# Patient Record
Sex: Female | Born: 1940 | Race: White | Hispanic: No | Marital: Married | Smoking: Never smoker
Health system: Southern US, Community
[De-identification: ages and names within clinical notes are randomized; demographics above are authoritative.]

## PROBLEM LIST (undated history)

## (undated) DIAGNOSIS — M159 Polyosteoarthritis, unspecified: Secondary | ICD-10-CM

## (undated) DIAGNOSIS — E039 Hypothyroidism, unspecified: Secondary | ICD-10-CM

## (undated) DIAGNOSIS — E669 Obesity, unspecified: Secondary | ICD-10-CM

## (undated) DIAGNOSIS — L299 Pruritus, unspecified: Secondary | ICD-10-CM

## (undated) DIAGNOSIS — B019 Varicella without complication: Secondary | ICD-10-CM

## (undated) DIAGNOSIS — E559 Vitamin D deficiency, unspecified: Secondary | ICD-10-CM

## (undated) DIAGNOSIS — I10 Essential (primary) hypertension: Secondary | ICD-10-CM

## (undated) DIAGNOSIS — L989 Disorder of the skin and subcutaneous tissue, unspecified: Secondary | ICD-10-CM

## (undated) DIAGNOSIS — R2681 Unsteadiness on feet: Secondary | ICD-10-CM

## (undated) DIAGNOSIS — B86 Scabies: Secondary | ICD-10-CM

## (undated) DIAGNOSIS — Z8719 Personal history of other diseases of the digestive system: Secondary | ICD-10-CM

## (undated) DIAGNOSIS — N200 Calculus of kidney: Secondary | ICD-10-CM

## (undated) DIAGNOSIS — T8859XA Other complications of anesthesia, initial encounter: Secondary | ICD-10-CM

## (undated) DIAGNOSIS — Z87442 Personal history of urinary calculi: Secondary | ICD-10-CM

## (undated) DIAGNOSIS — T7840XA Allergy, unspecified, initial encounter: Secondary | ICD-10-CM

## (undated) DIAGNOSIS — Z9889 Other specified postprocedural states: Secondary | ICD-10-CM

## (undated) DIAGNOSIS — N390 Urinary tract infection, site not specified: Secondary | ICD-10-CM

## (undated) DIAGNOSIS — K219 Gastro-esophageal reflux disease without esophagitis: Secondary | ICD-10-CM

## (undated) DIAGNOSIS — E079 Disorder of thyroid, unspecified: Secondary | ICD-10-CM

## (undated) DIAGNOSIS — J988 Other specified respiratory disorders: Secondary | ICD-10-CM

## (undated) DIAGNOSIS — B269 Mumps without complication: Secondary | ICD-10-CM

## (undated) DIAGNOSIS — B059 Measles without complication: Secondary | ICD-10-CM

## (undated) DIAGNOSIS — H8110 Benign paroxysmal vertigo, unspecified ear: Secondary | ICD-10-CM

## (undated) DIAGNOSIS — B9789 Other viral agents as the cause of diseases classified elsewhere: Secondary | ICD-10-CM

## (undated) DIAGNOSIS — M1990 Unspecified osteoarthritis, unspecified site: Secondary | ICD-10-CM

## (undated) DIAGNOSIS — Z0000 Encounter for general adult medical examination without abnormal findings: Secondary | ICD-10-CM

## (undated) DIAGNOSIS — M810 Age-related osteoporosis without current pathological fracture: Secondary | ICD-10-CM

## (undated) HISTORY — DX: Varicella without complication: B01.9

## (undated) HISTORY — DX: Unsteadiness on feet: R26.81

## (undated) HISTORY — DX: Other specified respiratory disorders: J98.8

## (undated) HISTORY — DX: Measles without complication: B05.9

## (undated) HISTORY — DX: Obesity, unspecified: E66.9

## (undated) HISTORY — DX: Personal history of other diseases of the digestive system: Z87.19

## (undated) HISTORY — DX: Allergy, unspecified, initial encounter: T78.40XA

## (undated) HISTORY — DX: Pruritus, unspecified: L29.9

## (undated) HISTORY — DX: Urinary tract infection, site not specified: N39.0

## (undated) HISTORY — DX: Age-related osteoporosis without current pathological fracture: M81.0

## (undated) HISTORY — PX: HIP SURGERY: SHX245

## (undated) HISTORY — DX: Mumps without complication: B26.9

## (undated) HISTORY — PX: LITHOTRIPSY: SUR834

## (undated) HISTORY — DX: Essential (primary) hypertension: I10

## (undated) HISTORY — PX: ABDOMINAL HYSTERECTOMY: SHX81

## (undated) HISTORY — PX: JOINT REPLACEMENT: SHX530

## (undated) HISTORY — PX: KNEE SURGERY: SHX244

## (undated) HISTORY — DX: Calculus of kidney: N20.0

## (undated) HISTORY — DX: Scabies: B86

## (undated) HISTORY — DX: Polyosteoarthritis, unspecified: M15.9

## (undated) HISTORY — DX: Benign paroxysmal vertigo, unspecified ear: H81.10

## (undated) HISTORY — DX: Disorder of the skin and subcutaneous tissue, unspecified: L98.9

## (undated) HISTORY — DX: Unspecified osteoarthritis, unspecified site: M19.90

## (undated) HISTORY — DX: Other viral agents as the cause of diseases classified elsewhere: B97.89

## (undated) HISTORY — DX: Gastro-esophageal reflux disease without esophagitis: K21.9

## (undated) HISTORY — DX: Vitamin D deficiency, unspecified: E55.9

## (undated) HISTORY — DX: Other specified postprocedural states: Z98.890

## (undated) HISTORY — DX: Disorder of thyroid, unspecified: E07.9

## (undated) HISTORY — DX: Encounter for general adult medical examination without abnormal findings: Z00.00

---

## 1998-07-06 DIAGNOSIS — N200 Calculus of kidney: Secondary | ICD-10-CM

## 1998-07-06 HISTORY — DX: Calculus of kidney: N20.0

## 2004-07-06 LAB — HM COLONOSCOPY

## 2007-07-07 LAB — HM MAMMOGRAPHY

## 2012-02-08 ENCOUNTER — Ambulatory Visit (INDEPENDENT_AMBULATORY_CARE_PROVIDER_SITE_OTHER): Payer: Medicare HMO | Admitting: Family Medicine

## 2012-02-08 ENCOUNTER — Encounter: Payer: Self-pay | Admitting: Family Medicine

## 2012-02-08 VITALS — BP 125/81 | HR 92 | Temp 97.6°F | Ht 61.25 in | Wt 212.8 lb

## 2012-02-08 DIAGNOSIS — E669 Obesity, unspecified: Secondary | ICD-10-CM

## 2012-02-08 DIAGNOSIS — E039 Hypothyroidism, unspecified: Secondary | ICD-10-CM

## 2012-02-08 DIAGNOSIS — B269 Mumps without complication: Secondary | ICD-10-CM | POA: Insufficient documentation

## 2012-02-08 DIAGNOSIS — E559 Vitamin D deficiency, unspecified: Secondary | ICD-10-CM

## 2012-02-08 DIAGNOSIS — N200 Calculus of kidney: Secondary | ICD-10-CM | POA: Insufficient documentation

## 2012-02-08 DIAGNOSIS — E079 Disorder of thyroid, unspecified: Secondary | ICD-10-CM

## 2012-02-08 DIAGNOSIS — Z0000 Encounter for general adult medical examination without abnormal findings: Secondary | ICD-10-CM

## 2012-02-08 DIAGNOSIS — M1990 Unspecified osteoarthritis, unspecified site: Secondary | ICD-10-CM | POA: Insufficient documentation

## 2012-02-08 DIAGNOSIS — T7840XA Allergy, unspecified, initial encounter: Secondary | ICD-10-CM

## 2012-02-08 DIAGNOSIS — I10 Essential (primary) hypertension: Secondary | ICD-10-CM

## 2012-02-08 DIAGNOSIS — L989 Disorder of the skin and subcutaneous tissue, unspecified: Secondary | ICD-10-CM

## 2012-02-08 DIAGNOSIS — Z9889 Other specified postprocedural states: Secondary | ICD-10-CM | POA: Insufficient documentation

## 2012-02-08 DIAGNOSIS — M129 Arthropathy, unspecified: Secondary | ICD-10-CM

## 2012-02-08 LAB — HEPATIC FUNCTION PANEL
Albumin: 3.7 g/dL (ref 3.5–5.2)
Alkaline Phosphatase: 58 U/L (ref 39–117)
Total Bilirubin: 0.4 mg/dL (ref 0.3–1.2)

## 2012-02-08 LAB — RENAL FUNCTION PANEL
BUN: 14 mg/dL (ref 6–23)
Chloride: 107 mEq/L (ref 96–112)
GFR: 97.23 mL/min (ref >60.00)
Phosphorus: 3.4 mg/dL (ref 2.3–4.6)
Potassium: 3.7 mEq/L (ref 3.5–5.1)
Sodium: 141 mEq/L (ref 135–145)

## 2012-02-08 LAB — CBC
MCHC: 33.4 g/dL (ref 30.0–36.0)
MCV: 90.3 fl (ref 78.0–100.0)
RDW: 14.1 % (ref 11.5–14.6)

## 2012-02-08 NOTE — Assessment & Plan Note (Signed)
Well controlled on current dose of medication, no changes.

## 2012-02-08 NOTE — Assessment & Plan Note (Signed)
No recent flares. Maintain adequate hydration 

## 2012-02-08 NOTE — Assessment & Plan Note (Signed)
Patient reports Synthroid dose has been stable recently. Recheck TSH and request old records. Once we know her dose is correct she will let us know where she would like her 3 month supply sent

## 2012-02-08 NOTE — Patient Instructions (Addendum)
Preventive Care for Adults, Female A healthy lifestyle and preventive care can promote health and wellness. Preventive health guidelines for women include the following key practices.  A routine yearly physical is a good way to check with your caregiver about your health and preventive screening. It is a chance to share any concerns and updates on your health, and to receive a thorough exam.   Visit your dentist for a routine exam and preventive care every 6 months. Brush your teeth twice a day and floss once a day. Good oral hygiene prevents tooth decay and gum disease.   The frequency of eye exams is based on your age, health, family medical history, use of contact lenses, and other factors. Follow your caregiver's recommendations for frequency of eye exams.   Eat a healthy diet. Foods like vegetables, fruits, whole grains, low-fat dairy products, and lean protein foods contain the nutrients you need without too many calories. Decrease your intake of foods high in solid fats, added sugars, and salt. Eat the right amount of calories for you.Get information about a proper diet from your caregiver, if necessary.   Regular physical exercise is one of the most important things you can do for your health. Most adults should get at least 150 minutes of moderate-intensity exercise (any activity that increases your heart rate and causes you to sweat) each week. In addition, most adults need muscle-strengthening exercises on 2 or more days a week.   Maintain a healthy weight. The body mass index (BMI) is a screening tool to identify possible weight problems. It provides an estimate of body fat based on height and weight. Your caregiver can help determine your BMI, and can help you achieve or maintain a healthy weight.For adults 20 years and older:   A BMI below 18.5 is considered underweight.   A BMI of 18.5 to 24.9 is normal.   A BMI of 25 to 29.9 is considered overweight.   A BMI of 30 and above is  considered obese.   Maintain normal blood lipids and cholesterol levels by exercising and minimizing your intake of saturated fat. Eat a balanced diet with plenty of fruit and vegetables. Blood tests for lipids and cholesterol should begin at age 20 and be repeated every 5 years. If your lipid or cholesterol levels are high, you are over 50, or you are at high risk for heart disease, you may need your cholesterol levels checked more frequently.Ongoing high lipid and cholesterol levels should be treated with medicines if diet and exercise are not effective.   If you smoke, find out from your caregiver how to quit. If you do not use tobacco, do not start.   If you are pregnant, do not drink alcohol. If you are breastfeeding, be very cautious about drinking alcohol. If you are not pregnant and choose to drink alcohol, do not exceed 1 drink per day. One drink is considered to be 12 ounces (355 mL) of beer, 5 ounces (148 mL) of wine, or 1.5 ounces (44 mL) of liquor.   Avoid use of street drugs. Do not share needles with anyone. Ask for help if you need support or instructions about stopping the use of drugs.   High blood pressure causes heart disease and increases the risk of stroke. Your blood pressure should be checked at least every 1 to 2 years. Ongoing high blood pressure should be treated with medicines if weight loss and exercise are not effective.   If you are 55 to 71   years old, ask your caregiver if you should take aspirin to prevent strokes.   Diabetes screening involves taking a blood sample to check your fasting blood sugar level. This should be done once every 3 years, after age 45, if you are within normal weight and without risk factors for diabetes. Testing should be considered at a younger age or be carried out more frequently if you are overweight and have at least 1 risk factor for diabetes.   Breast cancer screening is essential preventive care for women. You should practice "breast  self-awareness." This means understanding the normal appearance and feel of your breasts and may include breast self-examination. Any changes detected, no matter how small, should be reported to a caregiver. Women in their 20s and 30s should have a clinical breast exam (CBE) by a caregiver as part of a regular health exam every 1 to 3 years. After age 40, women should have a CBE every year. Starting at age 40, women should consider having a mammography (breast X-ray test) every year. Women who have a family history of breast cancer should talk to their caregiver about genetic screening. Women at a high risk of breast cancer should talk to their caregivers about having magnetic resonance imaging (MRI) and a mammography every year.   The Pap test is a screening test for cervical cancer. A Pap test can show cell changes on the cervix that might become cervical cancer if left untreated. A Pap test is a procedure in which cells are obtained and examined from the lower end of the uterus (cervix).   Women should have a Pap test starting at age 21.   Between ages 21 and 29, Pap tests should be repeated every 2 years.   Beginning at age 30, you should have a Pap test every 3 years as long as the past 3 Pap tests have been normal.   Some women have medical problems that increase the chance of getting cervical cancer. Talk to your caregiver about these problems. It is especially important to talk to your caregiver if a new problem develops soon after your last Pap test. In these cases, your caregiver may recommend more frequent screening and Pap tests.   The above recommendations are the same for women who have or have not gotten the vaccine for human papillomavirus (HPV).   If you had a hysterectomy for a problem that was not cancer or a condition that could lead to cancer, then you no longer need Pap tests. Even if you no longer need a Pap test, a regular exam is a good idea to make sure no other problems are  starting.   If you are between ages 65 and 70, and you have had normal Pap tests going back 10 years, you no longer need Pap tests. Even if you no longer need a Pap test, a regular exam is a good idea to make sure no other problems are starting.   If you have had past treatment for cervical cancer or a condition that could lead to cancer, you need Pap tests and screening for cancer for at least 20 years after your treatment.   If Pap tests have been discontinued, risk factors (such as a new sexual partner) need to be reassessed to determine if screening should be resumed.   The HPV test is an additional test that may be used for cervical cancer screening. The HPV test looks for the virus that can cause the cell changes on the cervix.   The cells collected during the Pap test can be tested for HPV. The HPV test could be used to screen women aged 30 years and older, and should be used in women of any age who have unclear Pap test results. After the age of 30, women should have HPV testing at the same frequency as a Pap test.   Colorectal cancer can be detected and often prevented. Most routine colorectal cancer screening begins at the age of 50 and continues through age 75. However, your caregiver may recommend screening at an earlier age if you have risk factors for colon cancer. On a yearly basis, your caregiver may provide home test kits to check for hidden blood in the stool. Use of a small camera at the end of a tube, to directly examine the colon (sigmoidoscopy or colonoscopy), can detect the earliest forms of colorectal cancer. Talk to your caregiver about this at age 50, when routine screening begins. Direct examination of the colon should be repeated every 5 to 10 years through age 75, unless early forms of pre-cancerous polyps or small growths are found.   Hepatitis C blood testing is recommended for all people born from 1945 through 1965 and any individual with known risks for hepatitis C.    Practice safe sex. Use condoms and avoid high-risk sexual practices to reduce the spread of sexually transmitted infections (STIs). STIs include gonorrhea, chlamydia, syphilis, trichomonas, herpes, HPV, and human immunodeficiency virus (HIV). Herpes, HIV, and HPV are viral illnesses that have no cure. They can result in disability, cancer, and death. Sexually active women aged 25 and younger should be checked for chlamydia. Older women with new or multiple partners should also be tested for chlamydia. Testing for other STIs is recommended if you are sexually active and at increased risk.   Osteoporosis is a disease in which the bones lose minerals and strength with aging. This can result in serious bone fractures. The risk of osteoporosis can be identified using a bone density scan. Women ages 65 and over and women at risk for fractures or osteoporosis should discuss screening with their caregivers. Ask your caregiver whether you should take a calcium supplement or vitamin D to reduce the rate of osteoporosis.   Menopause can be associated with physical symptoms and risks. Hormone replacement therapy is available to decrease symptoms and risks. You should talk to your caregiver about whether hormone replacement therapy is right for you.   Use sunscreen with sun protection factor (SPF) of 30 or more. Apply sunscreen liberally and repeatedly throughout the day. You should seek shade when your shadow is shorter than you. Protect yourself by wearing long sleeves, pants, a wide-brimmed hat, and sunglasses year round, whenever you are outdoors.   Once a month, do a whole body skin exam, using a mirror to look at the skin on your back. Notify your caregiver of new moles, moles that have irregular borders, moles that are larger than a pencil eraser, or moles that have changed in shape or color.   Stay current with required immunizations.   Influenza. You need a dose every fall (or winter). The composition of  the flu vaccine changes each year, so being vaccinated once is not enough.   Pneumococcal polysaccharide. You need 1 to 2 doses if you smoke cigarettes or if you have certain chronic medical conditions. You need 1 dose at age 65 (or older) if you have never been vaccinated.   Tetanus, diphtheria, pertussis (Tdap, Td). Get 1 dose of   Tdap vaccine if you are younger than age 65, are over 65 and have contact with an infant, are a healthcare worker, are pregnant, or simply want to be protected from whooping cough. After that, you need a Td booster dose every 10 years. Consult your caregiver if you have not had at least 3 tetanus and diphtheria-containing shots sometime in your life or have a deep or dirty wound.   HPV. You need this vaccine if you are a woman age 26 or younger. The vaccine is given in 3 doses over 6 months.   Measles, mumps, rubella (MMR). You need at least 1 dose of MMR if you were born in 1957 or later. You may also need a second dose.   Meningococcal. If you are age 19 to 21 and a first-year college student living in a residence hall, or have one of several medical conditions, you need to get vaccinated against meningococcal disease. You may also need additional booster doses.   Zoster (shingles). If you are age 60 or older, you should get this vaccine.   Varicella (chickenpox). If you have never had chickenpox or you were vaccinated but received only 1 dose, talk to your caregiver to find out if you need this vaccine.   Hepatitis A. You need this vaccine if you have a specific risk factor for hepatitis A virus infection or you simply wish to be protected from this disease. The vaccine is usually given as 2 doses, 6 to 18 months apart.   Hepatitis B. You need this vaccine if you have a specific risk factor for hepatitis B virus infection or you simply wish to be protected from this disease. The vaccine is given in 3 doses, usually over 6 months.  Preventive Services /  Frequency Ages 19 to 39  Blood pressure check.** / Every 1 to 2 years.   Lipid and cholesterol check.** / Every 5 years beginning at age 20.   Clinical breast exam.** / Every 3 years for women in their 20s and 30s.   Pap test.** / Every 2 years from ages 21 through 29. Every 3 years starting at age 30 through age 65 or 70 with a history of 3 consecutive normal Pap tests.   HPV screening.** / Every 3 years from ages 30 through ages 65 to 70 with a history of 3 consecutive normal Pap tests.   Hepatitis C blood test.** / For any individual with known risks for hepatitis C.   Skin self-exam. / Monthly.   Influenza immunization.** / Every year.   Pneumococcal polysaccharide immunization.** / 1 to 2 doses if you smoke cigarettes or if you have certain chronic medical conditions.   Tetanus, diphtheria, pertussis (Tdap, Td) immunization. / A one-time dose of Tdap vaccine. After that, you need a Td booster dose every 10 years.   HPV immunization. / 3 doses over 6 months, if you are 26 and younger.   Measles, mumps, rubella (MMR) immunization. / You need at least 1 dose of MMR if you were born in 1957 or later. You may also need a second dose.   Meningococcal immunization. / 1 dose if you are age 19 to 21 and a first-year college student living in a residence hall, or have one of several medical conditions, you need to get vaccinated against meningococcal disease. You may also need additional booster doses.   Varicella immunization.** / Consult your caregiver.   Hepatitis A immunization.** / Consult your caregiver. 2 doses, 6 to 18 months   apart.   Hepatitis B immunization.** / Consult your caregiver. 3 doses usually over 6 months.  Ages 40 to 64  Blood pressure check.** / Every 1 to 2 years.   Lipid and cholesterol check.** / Every 5 years beginning at age 20.   Clinical breast exam.** / Every year after age 40.   Mammogram.** / Every year beginning at age 40 and continuing for as  long as you are in good health. Consult with your caregiver.   Pap test.** / Every 3 years starting at age 30 through age 65 or 70 with a history of 3 consecutive normal Pap tests.   HPV screening.** / Every 3 years from ages 30 through ages 65 to 70 with a history of 3 consecutive normal Pap tests.   Fecal occult blood test (FOBT) of stool. / Every year beginning at age 50 and continuing until age 75. You may not need to do this test if you get a colonoscopy every 10 years.   Flexible sigmoidoscopy or colonoscopy.** / Every 5 years for a flexible sigmoidoscopy or every 10 years for a colonoscopy beginning at age 50 and continuing until age 75.   Hepatitis C blood test.** / For all people born from 1945 through 1965 and any individual with known risks for hepatitis C.   Skin self-exam. / Monthly.   Influenza immunization.** / Every year.   Pneumococcal polysaccharide immunization.** / 1 to 2 doses if you smoke cigarettes or if you have certain chronic medical conditions.   Tetanus, diphtheria, pertussis (Tdap, Td) immunization.** / A one-time dose of Tdap vaccine. After that, you need a Td booster dose every 10 years.   Measles, mumps, rubella (MMR) immunization. / You need at least 1 dose of MMR if you were born in 1957 or later. You may also need a second dose.   Varicella immunization.** / Consult your caregiver.   Meningococcal immunization.** / Consult your caregiver.   Hepatitis A immunization.** / Consult your caregiver. 2 doses, 6 to 18 months apart.   Hepatitis B immunization.** / Consult your caregiver. 3 doses, usually over 6 months.  Ages 65 and over  Blood pressure check.** / Every 1 to 2 years.   Lipid and cholesterol check.** / Every 5 years beginning at age 20.   Clinical breast exam.** / Every year after age 40.   Mammogram.** / Every year beginning at age 40 and continuing for as long as you are in good health. Consult with your caregiver.   Pap test.** /  Every 3 years starting at age 30 through age 65 or 70 with a 3 consecutive normal Pap tests. Testing can be stopped between 65 and 70 with 3 consecutive normal Pap tests and no abnormal Pap or HPV tests in the past 10 years.   HPV screening.** / Every 3 years from ages 30 through ages 65 or 70 with a history of 3 consecutive normal Pap tests. Testing can be stopped between 65 and 70 with 3 consecutive normal Pap tests and no abnormal Pap or HPV tests in the past 10 years.   Fecal occult blood test (FOBT) of stool. / Every year beginning at age 50 and continuing until age 75. You may not need to do this test if you get a colonoscopy every 10 years.   Flexible sigmoidoscopy or colonoscopy.** / Every 5 years for a flexible sigmoidoscopy or every 10 years for a colonoscopy beginning at age 50 and continuing until age 75.   Hepatitis   C blood test.** / For all people born from [birth year] through 1965 and any individual with known risks for hepatitis C.   Osteoporosis screening.** / A one-time screening for women ages 83 and over and women at risk for fractures or osteoporosis.   Skin self-exam. / Monthly.   Influenza immunization.** / Every year.   Pneumococcal polysaccharide immunization.** / 1 dose at age 61 (or older) if you have never been vaccinated.   Tetanus, diphtheria, pertussis (Tdap, Td) immunization. / A one-time dose of Tdap vaccine if you are over 65 and have contact with an infant, are a Research scientist (physical sciences), or simply want to be protected from whooping cough. After that, you need a Td booster dose every 10 years.   Varicella immunization.** / Consult your caregiver.   Meningococcal immunization.** / Consult your caregiver.   Hepatitis A immunization.** / Consult your caregiver. 2 doses, 6 to 18 months apart.   Hepatitis B immunization.** / Check with your caregiver. 3 doses, usually over 6 months.  ** Family history and personal history of risk and conditions may change your caregiver's  recommendations. Document Released: 08/18/2001 Document Revised: 06/11/2011 Document Reviewed: 11/17/2010 [facility] Patient Information 2012 [place], [place].  Try the Krill oil Schiff called MegaRed cap 1 daily

## 2012-02-08 NOTE — Assessment & Plan Note (Signed)
Has not had a MGM in years. Agrees to proceed with screening MGM, given hand written rx today

## 2012-02-08 NOTE — Assessment & Plan Note (Signed)
She is presently undergoing PT at [facility]. Has appt with her Rheumatologist in October when she returns for a visit.

## 2012-02-08 NOTE — Assessment & Plan Note (Signed)
Will check level has required hi dose therapy in the past. Is now on a daily dose.

## 2012-02-08 NOTE — Assessment & Plan Note (Signed)
Will monitor with patient, patient with limited mobility, consider DASH diet

## 2012-02-08 NOTE — Assessment & Plan Note (Signed)
Encouraged routine MGM and intermittent paps, request old records

## 2012-02-08 NOTE — Progress Notes (Signed)
Patient ID: Felicia Lowe, female   DOB: [birth year], 71 y.o.   MRN: [id number] Felicia Lowe [id number] [birth year] 02/08/2012      Progress Note-Follow Up  Subjective  Chief Complaint  Chief Complaint  Patient presents with  . Establish Care    new patient    HPI  Is a 71 year old Caucasian female who is in today to establish care. She has recently moved here from [place]. She reports generally feeling good health today but does have multiple medical patience. She's had both knees and both hips replaced due to severe osteoarthritis. Insetting chronic physical therapy secondary to gait abnormalities but feels this benefits her. Is presently seeing [facility] physical therapy. She had her last Pap in fall 2012 and denies any GYN complaints. He was started on vitamin D about 3 months ago does feel it helped her fatigue and malaise somewhat. The nature level rechecked. She's not had any recent illness, fevers, chills, chest pain, palpitations, shortness of breath, GI or GU complaints. She does note over patch on the tip of her nose has been coming and going over the last 2 months. Seems to help by applying hydrocortisone cream intermittently but it recurs.  Past Medical History  Diagnosis Date  . Thyroid disease   . Hypertension   . Vitamin d deficiency   . Chicken pox as a child  . Measles as a child  . Mumps as a child  . Osteoporosis   . Obesity   . Kidney stone 2000    nephritis as a child  . Allergy   . H/O breast biopsy     benign, on left  . Arthritis     osteoarthritis, severe, b/l knees &b/l hips  replaced    Past Surgical History  Procedure Date  . Knee surgery     both knees replaced  . Hip surgery     both hips    Family History  Problem Relation Age of Onset  . Hyperlipidemia Mother   . Hypertension Mother   . Heart attack Mother   . Cancer Father     lung- smoker  . Heart attack Father     X 2  . Thyroid disease Brother   . Hypertension Son   . Cancer  Paternal Grandmother     History   Social History  . Marital Status: Unknown    Spouse Name: N/A    Number of Children: N/A  . Years of Education: N/A   Occupational History  . Not on file.   Social History Main Topics  . Smoking status: Never Smoker   . Smokeless tobacco: Never Used  . Alcohol Use: Yes     Occasional glass of wine  . Drug Use: No  . Sexually Active: Not Currently   Other Topics Concern  . Not on file   Social History Narrative  . No narrative on file    Current Outpatient Prescriptions on File Prior to Visit  Medication Sig Dispense Refill  . levothyroxine (SYNTHROID, LEVOTHROID) 150 MCG tablet Take 150 mcg by mouth daily.      Marland Kitchen lisinopril-hydrochlorothiazide (PRINZIDE,ZESTORETIC) 20-12.5 MG per tablet Take 1 tablet by mouth daily.        Allergies  Allergen Reactions  . Eggs Or Egg-Derived Products   . Sulfa Antibiotics Nausea And Vomiting  . Penicillins Rash    Review of Systems  Review of Systems  Constitutional: Negative for fever and malaise/fatigue.  HENT: Negative for congestion.   Eyes: Negative  for discharge.  Respiratory: Negative for shortness of breath.   Cardiovascular: Negative for chest pain, palpitations and leg swelling.  Gastrointestinal: Negative for nausea, abdominal pain and diarrhea.  Genitourinary: Negative for dysuria.  Musculoskeletal: Positive for joint pain. Negative for falls.       Stiffness hips and knees  Skin: Positive for rash.       Lesion on tip of nose, seems to improve slightly with hydrocortisone and then recur for a couple of months now  Neurological: Negative for loss of consciousness and headaches.  Endo/Heme/Allergies: Negative for polydipsia.  Psychiatric/Behavioral: Negative for depression and suicidal ideas. The patient is not nervous/anxious and does not have insomnia.     Objective  BP 125/81  Pulse 92  Temp 97.6 F (36.4 C) (Temporal)  Ht 5' 1.25" (1.556 m)  Wt 212 lb 12.8 oz (96.525  kg)  BMI 39.88 kg/m2  SpO2 97%  Physical Exam  Physical Exam  Constitutional: She is oriented to person, place, and time and well-developed, well-nourished, and in no distress. No distress.       obesity  HENT:  Head: Normocephalic and atraumatic.  Right Ear: External ear normal.  Left Ear: External ear normal.  Nose: Nose normal.  Mouth/Throat: Oropharynx is clear and moist. No oropharyngeal exudate.  Eyes: Conjunctivae are normal. Pupils are equal, round, and reactive to light. Right eye exhibits no discharge. Left eye exhibits no discharge. No scleral icterus.  Neck: Normal range of motion. Neck supple. No thyromegaly present.  Cardiovascular: Normal rate, regular rhythm, normal heart sounds and intact distal pulses.   No murmur heard. Pulmonary/Chest: Effort normal and breath sounds normal. No respiratory distress. She has no wheezes. She has no rales.  Abdominal: Soft. Bowel sounds are normal. She exhibits no distension and no mass. There is no tenderness.  Musculoskeletal: Normal range of motion. She exhibits no edema and no tenderness.  Lymphadenopathy:    She has no cervical adenopathy.  Neurological: She is alert and oriented to person, place, and time. She has normal reflexes. No cranial nerve deficit. Coordination normal.  Skin: Skin is warm and dry. No rash noted. She is not diaphoretic.       Small, erythematous, scaly patch on tip of nose, scars c/w TKR b/l  Psychiatric: Mood, memory and affect normal.      Assessment & Plan  Arthritis She is presently undergoing PT at [facility]. Has appt with her Rheumatologist in October when she returns for a visit.  Allergic state Has only been in [place] for a couple months, is encouraged to try Zyrtec prn if symptoms worsen.   Kidney stone No recent flares. Maintain adequate hydration.  Vitamin d deficiency Will check level has required hi dose therapy in the past. Is now on a daily dose.   Hypertension Well controlled on  current dose of medication, no changes.  Thyroid disease Patient reports Synthroid dose has been stable recently. Recheck TSH and request old records. Once we know her dose is correct she will let us know where she would like her 3 month supply sent  Obesity Will monitor with patient, patient with limited mobility, consider DASH diet  H/O breast biopsy Has not had a MGM in years. Agrees to proceed with screening MGM, given hand written rx today  Preventative health care Encouraged routine MGM and intermittent paps, request old records

## 2012-02-08 NOTE — Assessment & Plan Note (Signed)
Has only been in [place] for a couple months, is encouraged to try Zyrtec prn if symptoms worsen.

## 2012-02-09 LAB — TSH: TSH: 0.31 u[IU]/mL — ABNORMAL LOW (ref 0.35–5.50)

## 2012-02-10 NOTE — Progress Notes (Signed)
Quick Note:  Patient Informed and voiced understanding. Pt states she still isn't sure where to send the RX to. Pt stated she still has 3 refills so she is good until she sees MD again. ______

## 2012-02-11 LAB — VITAMIN D 1,25 DIHYDROXY
Vitamin D 1, 25 (OH)2 Total: 69 pg/mL (ref 18–72)
Vitamin D2 1, 25 (OH)2: <8 pg/mL

## 2012-02-11 NOTE — Progress Notes (Signed)
Quick Note:  Patient Informed and voiced understanding ______ 

## 2012-05-09 ENCOUNTER — Other Ambulatory Visit: Payer: Medicare HMO

## 2012-05-10 ENCOUNTER — Telehealth: Payer: Self-pay | Admitting: Family Medicine

## 2012-05-10 MED ORDER — LISINOPRIL-HYDROCHLOROTHIAZIDE 20-12.5 MG PO TABS: 1.0000 | ORAL_TABLET | Freq: Every day | ORAL | Status: DC

## 2012-05-10 NOTE — Telephone Encounter (Signed)
Left a message with pts spouse to have pt return my call. Pts med list shows Lisinopril-HCTZ 20-12.5 mg? Need to know which one to send.

## 2012-05-10 NOTE — Telephone Encounter (Signed)
Pt confirmed that the Lisinopril is the Lisinopril-HCTZ 20-12.5 mg. I will send 90 day supply per pt

## 2012-05-10 NOTE — Telephone Encounter (Signed)
Lisinopril 12.5mg , patient mentioned to Dr Abner Greenspan in last OV that she would need this refilled. Original Rx came from [facility] in [place]. [facility]

## 2012-05-18 ENCOUNTER — Encounter: Payer: Self-pay | Admitting: Family Medicine

## 2012-05-18 ENCOUNTER — Ambulatory Visit (INDEPENDENT_AMBULATORY_CARE_PROVIDER_SITE_OTHER): Payer: Medicare HMO | Admitting: Family Medicine

## 2012-05-18 VITALS — BP 126/77 | HR 82 | Temp 98.4°F | Ht 61.25 in | Wt 214.4 lb

## 2012-05-18 DIAGNOSIS — E079 Disorder of thyroid, unspecified: Secondary | ICD-10-CM

## 2012-05-18 DIAGNOSIS — I10 Essential (primary) hypertension: Secondary | ICD-10-CM

## 2012-05-18 DIAGNOSIS — M129 Arthropathy, unspecified: Secondary | ICD-10-CM

## 2012-05-18 DIAGNOSIS — N200 Calculus of kidney: Secondary | ICD-10-CM

## 2012-05-18 DIAGNOSIS — E039 Hypothyroidism, unspecified: Secondary | ICD-10-CM

## 2012-05-18 DIAGNOSIS — L989 Disorder of the skin and subcutaneous tissue, unspecified: Secondary | ICD-10-CM

## 2012-05-18 DIAGNOSIS — M1990 Unspecified osteoarthritis, unspecified site: Secondary | ICD-10-CM

## 2012-05-18 HISTORY — DX: Disorder of the skin and subcutaneous tissue, unspecified: L98.9

## 2012-05-18 LAB — TSH: TSH: 2.47 u[IU]/mL (ref 0.35–5.50)

## 2012-05-18 MED ORDER — LEVOTHYROXINE SODIUM 150 MCG PO TABS: ORAL_TABLET | ORAL | Status: DC

## 2012-05-18 NOTE — Progress Notes (Signed)
Patient ID: Felicia Lowe, female   DOB: 1940-10-17, 71 y.o.   MRN: [id number] Felicia Lowe [id number] 01-Jan-1941 05/18/2012      Progress Note-Follow Up  Subjective  Chief Complaint  Chief Complaint  Patient presents with  . Follow-up    3 month    HPI  Patient is a 71 year old Caucasian female who is here today in followup of her new patient he's recently been seen by her arthritis specialist back in [place] and been told she is doing well. She agrees. She notes she's walking relatively well. She's been placed back into physical therapy 2 times a week for the next 16 weeks which she does find helpful. She denies any recent nose, fevers, chills, chest pain, palpitations, shortness of breath, GI or GU concerns. She's recently started with chiropractic massage and does think that has been somewhat helpful. The lesion on her nose has been excised although she does await further intervention by Mohs surgeon at the cell type was found to be atypical although not diagnosed as cancer thus far. She would like to switch dermatologists a she will call for the name of the dermatologist she is interested in.  Past Medical History  Diagnosis Date  . Thyroid disease   . Hypertension   . Vitamin D deficiency   . Chicken pox as a child  . Measles as a child  . Mumps as a child  . Osteoporosis   . Obesity   . Kidney stone 2000    nephritis as a child  . Allergy   . H/O breast biopsy     benign, on left  . Arthritis     osteoarthritis, severe, b/l knees &b/l hips  replaced  . Skin lesion of face 05/18/2012    Past Surgical History  Procedure Date  . Knee surgery     both knees replaced  . Hip surgery     both hips    Family History  Problem Relation Age of Onset  . Hyperlipidemia Mother   . Hypertension Mother   . Heart attack Mother   . Cancer Father     lung- smoker  . Heart attack Father     X 2  . Thyroid disease Brother   . Hypertension Son   . Cancer  Paternal Grandmother     History   Social History  . Marital Status: Unknown    Spouse Name: N/A    Number of Children: N/A  . Years of Education: N/A   Occupational History  . Not on file.   Social History Main Topics  . Smoking status: Never Smoker   . Smokeless tobacco: Never Used  . Alcohol Use: Yes     Comment: Occasional glass of wine  . Drug Use: No  . Sexually Active: Not Currently   Other Topics Concern  . Not on file   Social History Narrative  . No narrative on file    Current Outpatient Prescriptions on File Prior to Visit  Medication Sig Dispense Refill  . Ascorbic Acid (VITAMIN C) 1000 MG tablet Take 1,000 mg by mouth daily.      . Cholecalciferol (VITAMIN D) 2000 UNITS tablet Take 2,000 Units by mouth daily.      . Coenzyme Q10 (CO Q 10) 100 MG CAPS Take 400 mg by mouth daily.      Marland Kitchen levothyroxine (SYNTHROID, LEVOTHROID) 150 MCG tablet Take 150 mcg by mouth daily.      Marland Kitchen lisinopril-hydrochlorothiazide (PRINZIDE,ZESTORETIC) 20-12.5 MG  per tablet Take 1 tablet by mouth daily.  90 tablet  1    Allergies  Allergen Reactions  . Celebrex (Celecoxib)     headaches  . Eggs Or Egg-Derived Products   . Sulfa Antibiotics Nausea And Vomiting  . Penicillins Rash    Review of Systems  Review of Systems  Constitutional: Negative for fever and malaise/fatigue.  HENT: Negative for congestion.   Eyes: Negative for discharge.  Respiratory: Negative for shortness of breath.   Cardiovascular: Negative for chest pain, palpitations and leg swelling.  Gastrointestinal: Negative for nausea, abdominal pain and diarrhea.  Genitourinary: Negative for dysuria.  Musculoskeletal: Negative for falls.  Skin: Negative for rash.  Neurological: Negative for loss of consciousness and headaches.  Endo/Heme/Allergies: Negative for polydipsia.  Psychiatric/Behavioral: Negative for depression and suicidal ideas. The patient is not nervous/anxious and does not have insomnia.      Objective  BP 126/77  Pulse 82  Temp 98.4 F (36.9 C) (Temporal)  Ht 5' 1.25" (1.556 m)  Wt 214 lb 6.4 oz (97.251 kg)  BMI 40.18 kg/m2  SpO2 94%  Physical Exam  Physical Exam  Constitutional: She is oriented to person, place, and time and well-developed, well-nourished, and in no distress. No distress.  HENT:  Head: Normocephalic and atraumatic.  Eyes: Conjunctivae normal are normal.  Neck: Neck supple. No thyromegaly present.  Cardiovascular: Normal rate, regular rhythm and normal heart sounds.   No murmur heard. Pulmonary/Chest: Effort normal and breath sounds normal. She has no wheezes.  Abdominal: She exhibits no distension and no mass.  Musculoskeletal: She exhibits no edema.  Lymphadenopathy:    She has no cervical adenopathy.  Neurological: She is alert and oriented to person, place, and time.  Skin: Skin is warm and dry. No rash noted. She is not diaphoretic.  Psychiatric: Memory, affect and judgment normal.    Lab Results  Component Value Date   TSH 0.31* 02/08/2012   Lab Results  Component Value Date   WBC 5.9 02/08/2012   HGB 14.1 02/08/2012   HCT 42.3 02/08/2012   MCV 90.3 02/08/2012   PLT 248.0 02/08/2012   Lab Results  Component Value Date   CREATININE 0.6 02/08/2012   BUN 14 02/08/2012   NA 141 02/08/2012   K 3.7 02/08/2012   CL 107 02/08/2012   CO2 26 02/08/2012   Lab Results  Component Value Date   ALT 16 02/08/2012   AST 18 02/08/2012   ALKPHOS 58 02/08/2012   BILITOT 0.4 02/08/2012     Assessment & Plan  Skin lesion of face Tip of nose, removed by dermatology recently but was not happy with the office she was referred to so would like to go elsewhere for further treatment. She is reporting that her family has recommended a Mohs Surgeon, Dr Burnell Blanks she will call back with referral info so we can arrange or she will call her self Biopsy revealed atypia requiring furher intervention  Thyroid disease TSH mildly suppressed at her last visit, we dropped her  Synthroid to 6 times a week at that time. Will recheck TSH today  Hypertension Adequately controlled no change in meds  Arthritis Seen by Dr Genelle Bal in October and is doing well, they have ordered 16 more weeks of PT 2 x a week which patient thinks is helping

## 2012-05-18 NOTE — Assessment & Plan Note (Signed)
TSH mildly suppressed at her last visit, we dropped her Synthroid to 6 times a week at that time. Will recheck TSH today

## 2012-05-18 NOTE — Assessment & Plan Note (Signed)
Adequately controlled no change in meds 

## 2012-05-18 NOTE — Assessment & Plan Note (Addendum)
Tip of nose, removed by dermatology recently but was not happy with the office she was referred to so would like to go elsewhere for further treatment. She is reporting that her family has recommended a Mohs Surgeon, Dr Burnell Blanks she will call back with referral info so we can arrange or she will call her self Biopsy revealed atypia requiring furher intervention

## 2012-05-18 NOTE — Assessment & Plan Note (Signed)
Seen by Dr Genelle Bal in October and is doing well, they have ordered 16 more weeks of PT 2 x a week which patient thinks is helping

## 2012-06-08 ENCOUNTER — Telehealth: Payer: Self-pay

## 2012-06-08 NOTE — Telephone Encounter (Signed)
Per md inform pt that we received her pathology report from her left nasal tip and it showed basal cell carcinoma. Please ask patient what physican she would like to be referred to?

## 2012-06-10 NOTE — Telephone Encounter (Signed)
Left another message on patients voicemail to return my call. Per MD if patient doesn't return call by first of next week have Nikki type up a certified letter to send to patient with this information

## 2012-06-13 ENCOUNTER — Encounter: Payer: Self-pay | Admitting: Family Medicine

## 2012-06-13 NOTE — Telephone Encounter (Signed)
Felicia Lowe per MD please type up a certified letter.

## 2012-06-14 NOTE — Telephone Encounter (Signed)
Patient has been out of town. She is back now, she is leaving this morning for physical therapy. She will be back home at 11:30 for the rest of the day. Please call her back.

## 2012-06-15 NOTE — Telephone Encounter (Signed)
Patient has an appt Jan 6 with Dr Ethelene Hal?

## 2012-06-21 ENCOUNTER — Other Ambulatory Visit: Payer: Self-pay

## 2012-06-21 DIAGNOSIS — E039 Hypothyroidism, unspecified: Secondary | ICD-10-CM

## 2012-06-21 MED ORDER — LEVOTHYROXINE SODIUM 150 MCG PO TABS: ORAL_TABLET | ORAL | Status: DC

## 2012-09-16 ENCOUNTER — Encounter: Payer: Self-pay | Admitting: Family Medicine

## 2012-09-16 ENCOUNTER — Ambulatory Visit (INDEPENDENT_AMBULATORY_CARE_PROVIDER_SITE_OTHER): Payer: Medicare HMO | Admitting: Family Medicine

## 2012-09-16 VITALS — BP 139/76 | HR 96 | Temp 98.6°F | Ht 61.25 in | Wt 214.4 lb

## 2012-09-16 DIAGNOSIS — I10 Essential (primary) hypertension: Secondary | ICD-10-CM

## 2012-09-16 DIAGNOSIS — E079 Disorder of thyroid, unspecified: Secondary | ICD-10-CM

## 2012-09-16 DIAGNOSIS — M1990 Unspecified osteoarthritis, unspecified site: Secondary | ICD-10-CM

## 2012-09-16 DIAGNOSIS — R5383 Other fatigue: Secondary | ICD-10-CM

## 2012-09-16 DIAGNOSIS — R609 Edema, unspecified: Secondary | ICD-10-CM

## 2012-09-16 DIAGNOSIS — M129 Arthropathy, unspecified: Secondary | ICD-10-CM

## 2012-09-16 DIAGNOSIS — IMO0001 Reserved for inherently not codable concepts without codable children: Secondary | ICD-10-CM

## 2012-09-16 DIAGNOSIS — R5381 Other malaise: Secondary | ICD-10-CM

## 2012-09-16 LAB — TSH: TSH: 0.59 u[IU]/mL (ref 0.35–5.50)

## 2012-09-16 LAB — T4, FREE: Free T4: 1.22 ng/dL (ref 0.60–1.60)

## 2012-09-16 LAB — CBC
MCHC: 33.8 g/dL (ref 30.0–36.0)
Platelets: 281 10*3/uL (ref 150.0–400.0)
RDW: 13.3 % (ref 11.5–14.6)

## 2012-09-16 MED ORDER — CYCLOBENZAPRINE HCL 10 MG PO TABS: 10.0000 mg | ORAL_TABLET | Freq: Every evening | ORAL | Status: DC | PRN

## 2012-09-16 MED ORDER — FUROSEMIDE 20 MG PO TABS: 20.0000 mg | ORAL_TABLET | Freq: Every day | ORAL | Status: DC | PRN

## 2012-09-16 NOTE — Assessment & Plan Note (Signed)
Has established with a chiropractor and a massage therapy practitioner, doing better. Continue current care

## 2012-09-16 NOTE — Patient Instructions (Addendum)
Edema Edema is an abnormal build-up of fluids in tissues. Because this is partly dependent on gravity (water flows to the lowest place), it is more common in the legs and thighs (lower extremities). It is also common in the looser tissues, like around the eyes. Painless swelling of the feet and ankles is common and increases as a person ages. It may affect both legs and may include the calves or even thighs. When squeezed, the fluid may move out of the affected area and may leave a dent for a few moments. CAUSES   Prolonged standing or sitting in one place for extended periods of time. Movement helps pump tissue fluid into the veins, and absence of movement prevents this, resulting in edema.  Varicose veins. The valves in the veins do not work as well as they should. This causes fluid to leak into the tissues.  Fluid and salt overload.  Injury, burn, or surgery to the leg, ankle, or foot, may damage veins and allow fluid to leak out.  Sunburn damages vessels. Leaky vessels allow fluid to go out into the sunburned tissues.  Allergies (from insect bites or stings, medications or chemicals) cause swelling by allowing vessels to become leaky.  Protein in the blood helps keep fluid in your vessels. Low protein, as in malnutrition, allows fluid to leak out.  Hormonal changes, including pregnancy and menstruation, cause fluid retention. This fluid may leak out of vessels and cause edema.  Medications that cause fluid retention. Examples are sex hormones, blood pressure medications, steroid treatment, or anti-depressants.  Some illnesses cause edema, especially heart failure, kidney disease, or liver disease.  Surgery that cuts veins or lymph nodes, such as surgery done for the heart or for breast cancer, may result in edema. DIAGNOSIS  Your caregiver is usually easily able to determine what is causing your swelling (edema) by simply asking what is wrong (getting a history) and examining you (doing  a physical). Sometimes x-rays, EKG (electrocardiogram or heart tracing), and blood work may be done to evaluate for underlying medical illness. TREATMENT  General treatment includes:  Leg elevation (or elevation of the affected body part).  Restriction of fluid intake.  Prevention of fluid overload.  Compression of the affected body part. Compression with elastic bandages or support stockings squeezes the tissues, preventing fluid from entering and forcing it back into the blood vessels.  Diuretics (also called water pills or fluid pills) pull fluid out of your body in the form of increased urination. These are effective in reducing the swelling, but can have side effects and must be used only under your caregiver's supervision. Diuretics are appropriate only for some types of edema. The specific treatment can be directed at any underlying causes discovered. Heart, liver, or kidney disease should be treated appropriately. HOME CARE INSTRUCTIONS   Elevate the legs (or affected body part) above the level of the heart, while lying down.  Avoid sitting or standing still for prolonged periods of time.  Avoid putting anything directly under the knees when lying down, and do not wear constricting clothing or garters on the upper legs.  Exercising the legs causes the fluid to work back into the veins and lymphatic channels. This may help the swelling go down.  The pressure applied by elastic bandages or support stockings can help reduce ankle swelling.  A low-salt diet may help reduce fluid retention and decrease the ankle swelling.  Take any medications exactly as prescribed. SEEK MEDICAL CARE IF:  Your edema is   not responding to recommended treatments. SEEK IMMEDIATE MEDICAL CARE IF:   You develop shortness of breath or chest pain.  You cannot breathe when you lay down; or if, while lying down, you have to get up and go to the window to get your breath.  You are having increasing  swelling without relief from treatment.  You develop a fever over 102 F (38.9 C).  You develop pain or redness in the areas that are swollen.  Tell your caregiver right away if you have gained 3 lb/1.4 kg in 1 day or 5 lb/2.3 kg in a week. MAKE SURE YOU:   Understand these instructions.  Will watch your condition.  Will get help right away if you are not doing well or get worse. Document Released: 06/22/2005 Document Revised: 12/22/2011 Document Reviewed: 02/08/2008 [employer] Patient Information 2013 [employer].  

## 2012-09-16 NOTE — Assessment & Plan Note (Signed)
Adequately controlled, no changes today

## 2012-09-17 NOTE — Progress Notes (Signed)
Patient ID: Felicia Lowe, female   DOB: 1940-07-25, 72 y.o.   MRN: [id number] Felicia Lowe [id number] 1941/05/10 09/17/2012      Progress Note-Follow Up  Subjective  Chief Complaint  Chief Complaint  Patient presents with  . Fatigue    cold ? thyroid problems    HPI  Patient is a 71 year old Caucasian female who is in today for followup. Her biggest complaint is fatigue. She feels her Synthroid does better when scanning. She has cold intolerance and isn't sleeping well as well. She's very pleased however with her improved pain. She started to see a new chiropractor massage therapist and her pain and mobility are greatly improved. He denies any new or acute complaints otherwise. No chest pain, palpitations, shortness of breath or GU concerns noted today.  Past Medical History  Diagnosis Date  . Thyroid disease   . Hypertension   . Vitamin D deficiency   . Chicken pox as a child  . Measles as a child  . Mumps as a child  . Osteoporosis   . Obesity   . Kidney stone 2000    nephritis as a child  . Allergy   . H/O breast biopsy     benign, on left  . Arthritis     osteoarthritis, severe, b/l knees &b/l hips  replaced  . Skin lesion of face 05/18/2012    Past Surgical History  Procedure Laterality Date  . Knee surgery      both knees replaced  . Hip surgery      both hips    Family History  Problem Relation Age of Onset  . Hyperlipidemia Mother   . Hypertension Mother   . Heart attack Mother   . Cancer Father     lung- smoker  . Heart attack Father     X 2  . Thyroid disease Brother   . Hypertension Son   . Cancer Paternal Grandmother     History   Social History  . Marital Status: Unknown    Spouse Name: N/A    Number of Children: N/A  . Years of Education: N/A   Occupational History  . Not on file.   Social History Main Topics  . Smoking status: Never Smoker   . Smokeless tobacco: Never Used  . Alcohol Use: Yes     Comment: Occasional glass of  wine  . Drug Use: No  . Sexually Active: Not Currently   Other Topics Concern  . Not on file   Social History Narrative  . No narrative on file    Current Outpatient Prescriptions on File Prior to Visit  Medication Sig Dispense Refill  . Ascorbic Acid (VITAMIN C) 1000 MG tablet Take 1,000 mg by mouth daily.      . Cholecalciferol (VITAMIN D) 2000 UNITS tablet Take 2,000 Units by mouth daily.      . Coenzyme Q10 (CO Q 10) 100 MG CAPS Take 400 mg by mouth daily.      Marland Kitchen levothyroxine (SYNTHROID, LEVOTHROID) 150 MCG tablet 1 tab po daily x 6 days a week  30 tablet  5  . lisinopril-hydrochlorothiazide (PRINZIDE,ZESTORETIC) 20-12.5 MG per tablet Take 1 tablet by mouth daily.  90 tablet  1   No current facility-administered medications on file prior to visit.    Allergies  Allergen Reactions  . Celebrex (Celecoxib)     headaches  . Eggs Or Egg-Derived Products   . Sulfa Antibiotics Nausea And Vomiting  . Penicillins Rash  Review of Systems  Review of Systems  Constitutional: Positive for malaise/fatigue. Negative for fever.  HENT: Negative for congestion.   Eyes: Negative for discharge.  Respiratory: Negative for shortness of breath.   Cardiovascular: Negative for chest pain, palpitations and leg swelling.  Gastrointestinal: Negative for nausea, abdominal pain and diarrhea.  Genitourinary: Negative for dysuria.  Musculoskeletal: Negative for falls.  Skin: Negative for rash.  Neurological: Negative for loss of consciousness and headaches.  Endo/Heme/Allergies: Negative for polydipsia.  Psychiatric/Behavioral: Negative for depression and suicidal ideas. The patient has insomnia. The patient is not nervous/anxious.      Objective  BP 139/76  Pulse 96  Temp(Src) 98.6 F (37 C) (Temporal)  Ht 5' 1.25" (1.556 m)  Wt 214 lb 6.4 oz (97.251 kg)  BMI 40.17 kg/m2  SpO2 96%  Physical Exam  Physical Exam  Constitutional: She is oriented to person, place, and time and  well-developed, well-nourished, and in no distress. No distress.  HENT:  Head: Normocephalic and atraumatic.  Eyes: Conjunctivae are normal.  Neck: Neck supple. No thyromegaly present.  Cardiovascular: Normal rate, regular rhythm and normal heart sounds.   No murmur heard. Pulmonary/Chest: Effort normal and breath sounds normal. She has no wheezes.  Abdominal: She exhibits no distension and no mass.  Musculoskeletal: She exhibits no edema.  Lymphadenopathy:    She has no cervical adenopathy.  Neurological: She is alert and oriented to person, place, and time.  Skin: Skin is warm and dry. No rash noted. She is not diaphoretic.  Psychiatric: Memory, affect and judgment normal.    Lab Results  Component Value Date   TSH 2.47 05/18/2012   Lab Results  Component Value Date   WBC 5.9 02/08/2012   HGB 14.1 02/08/2012   HCT 42.3 02/08/2012   MCV 90.3 02/08/2012   PLT 248.0 02/08/2012   Lab Results  Component Value Date   CREATININE 0.6 02/08/2012   BUN 14 02/08/2012   NA 141 02/08/2012   K 3.7 02/08/2012   CL 107 02/08/2012   CO2 26 02/08/2012   Lab Results  Component Value Date   ALT 16 02/08/2012   AST 18 02/08/2012   ALKPHOS 58 02/08/2012   BILITOT 0.4 02/08/2012    Assessment & Plan  Hypertension Adequately controlled, no changes today  Arthritis Has established with a chiropractor and a massage therapy practitioner, doing better. Continue current care  Thyroid disease Has been struggling with increased fatigue not the generic levothyroxine is not treating her disease. We'll consider dispensing brand-name Synthroid which she has tolerated better in the past. She also notes cold intolerance and difficulty with sleeping. Overweight thyroid study results before proceeding with any dosing change

## 2012-09-17 NOTE — Assessment & Plan Note (Signed)
Has been struggling with increased fatigue not the generic levothyroxine is not treating her disease. We'll consider dispensing brand-name Synthroid which she has tolerated better in the past. She also notes cold intolerance and difficulty with sleeping. Overweight thyroid study results before proceeding with any dosing change

## 2012-09-19 LAB — RENAL FUNCTION PANEL
Chloride: 102 mEq/L (ref 96–112)
GFR: 78.41 mL/min (ref >60.00)
Phosphorus: 3.6 mg/dL (ref 2.3–4.6)
Potassium: 4.2 mEq/L (ref 3.5–5.1)

## 2012-09-20 ENCOUNTER — Telehealth: Payer: Self-pay | Admitting: *Deleted

## 2012-09-20 NOTE — Telephone Encounter (Signed)
LMOM with contact name and number for return call RE: scheduling f/u appointment in June 2014 [canceled upcoming (03.28.14)] and further provider instructions/SLS

## 2012-09-20 NOTE — Telephone Encounter (Signed)
So provided she feels well she can wait til June, her labs look good except sugar was up slightly, it was late in the day so I am sure she had eaten so we do not need to make any big changes. Just minimize simple carbs and we can recheck in June.please make sure the dates get changed on the schedule

## 2012-09-20 NOTE — Telephone Encounter (Signed)
Patient called RE: thyroid lab results; informed her of the numbers & that provider had noted that results would be discussed at upcoming OV scheduled for 03.28.14. Pt states that she came in early for OV and that this appointment was to be canceled & she would not be due back in until June [correlates with AVS of 03.14.14 OV]. Pt request clarification/SLS Please advise.

## 2012-09-30 ENCOUNTER — Ambulatory Visit: Payer: Medicare HMO | Admitting: Family Medicine

## 2012-10-04 ENCOUNTER — Telehealth: Payer: Self-pay | Admitting: Family Medicine

## 2012-10-04 DIAGNOSIS — E079 Disorder of thyroid, unspecified: Secondary | ICD-10-CM

## 2012-10-04 MED ORDER — SYNTHROID 150 MCG PO TABS: 150.0000 ug | ORAL_TABLET | Freq: Every day | ORAL | Status: DC

## 2012-10-04 NOTE — Telephone Encounter (Signed)
Patient Information:  Caller Name: Felicia Lowe  Phone: [phone]  Patient: Felicia, Lowe  Gender: Female  DOB: 05-28-1941  Age: 72 Years  PCP: Danise [facility])  Office Follow Up:  Does the office need to follow up with this patient?: Yes  Instructions For The Office: Patient is wanting to go back to Synthroid . Does not like Levothyroxine side effects.  Please advise  RN Note:  Pharmacy- [facility].  Please contact patient with possible medication change.  Last office visit 09/16/12.  Symptoms  Reason For Call & Symptoms: Patient states she has Thyroid issues and has been on Synthroid ; Until Dr. Rogelia Rohrer started her Levothyroxine.  She has muscle aches and  not sleeping. She does not feel well on the Levothyroxine.   She has been on the Levothyroxine x6  days.  She states she does not remember Synthroid dosage "maybe highest one"  Last TSH 09/2012 ---0.59  Reviewed Health History In EMR: Yes  Reviewed Medications In EMR: Yes  Reviewed Allergies In EMR: Yes  Reviewed Surgeries / Procedures: Yes  Date of Onset of Symptoms: 08/06/2012  Guideline(s) Used:  No Protocol Available - Information Only  Disposition Per Guideline:   Discuss with PCP and Callback by Nurse Today  Reason For Disposition Reached:   Nursing judgment  Advice Given:  Call Back If:  New symptoms develop  You become worse.  Patient Will Follow Care Advice:  YES

## 2012-10-04 NOTE — Telephone Encounter (Signed)
It is fine with me for her to go back on the Synthroid DAW 150 mcg I suspect, 1 tab po daily and then recheck TSH, freeT4 in 10 weeks to assess. Disp #30, 3 rf. Warn her sometimes insurance declines to pay for it.

## 2012-10-04 NOTE — Telephone Encounter (Signed)
Patient informed, understood & agreed; new Rx to pharmacy & future lab orders placed/SLS

## 2012-11-17 ENCOUNTER — Emergency Department (HOSPITAL_BASED_OUTPATIENT_CLINIC_OR_DEPARTMENT_OTHER)
Admission: EM | Admit: 2012-11-17 | Discharge: 2012-11-17 | Disposition: A | Discharge status: Home / Self Care | Payer: Medicare HMO | Attending: Emergency Medicine | Admitting: Emergency Medicine

## 2012-11-17 ENCOUNTER — Encounter (HOSPITAL_BASED_OUTPATIENT_CLINIC_OR_DEPARTMENT_OTHER): Payer: Self-pay

## 2012-11-17 ENCOUNTER — Emergency Department (HOSPITAL_BASED_OUTPATIENT_CLINIC_OR_DEPARTMENT_OTHER): Payer: Medicare HMO

## 2012-11-17 ENCOUNTER — Other Ambulatory Visit: Payer: Self-pay

## 2012-11-17 DIAGNOSIS — M810 Age-related osteoporosis without current pathological fracture: Secondary | ICD-10-CM | POA: Insufficient documentation

## 2012-11-17 DIAGNOSIS — J3489 Other specified disorders of nose and nasal sinuses: Secondary | ICD-10-CM | POA: Insufficient documentation

## 2012-11-17 DIAGNOSIS — R209 Unspecified disturbances of skin sensation: Secondary | ICD-10-CM | POA: Insufficient documentation

## 2012-11-17 DIAGNOSIS — I10 Essential (primary) hypertension: Secondary | ICD-10-CM | POA: Insufficient documentation

## 2012-11-17 DIAGNOSIS — H538 Other visual disturbances: Secondary | ICD-10-CM | POA: Insufficient documentation

## 2012-11-17 DIAGNOSIS — H8110 Benign paroxysmal vertigo, unspecified ear: Secondary | ICD-10-CM | POA: Insufficient documentation

## 2012-11-17 DIAGNOSIS — Z79899 Other long term (current) drug therapy: Secondary | ICD-10-CM | POA: Insufficient documentation

## 2012-11-17 DIAGNOSIS — Z8619 Personal history of other infectious and parasitic diseases: Secondary | ICD-10-CM | POA: Insufficient documentation

## 2012-11-17 DIAGNOSIS — R42 Dizziness and giddiness: Secondary | ICD-10-CM | POA: Insufficient documentation

## 2012-11-17 DIAGNOSIS — Z872 Personal history of diseases of the skin and subcutaneous tissue: Secondary | ICD-10-CM | POA: Insufficient documentation

## 2012-11-17 DIAGNOSIS — Z8639 Personal history of other endocrine, nutritional and metabolic disease: Secondary | ICD-10-CM | POA: Insufficient documentation

## 2012-11-17 DIAGNOSIS — Z87442 Personal history of urinary calculi: Secondary | ICD-10-CM | POA: Insufficient documentation

## 2012-11-17 DIAGNOSIS — R509 Fever, unspecified: Secondary | ICD-10-CM | POA: Insufficient documentation

## 2012-11-17 DIAGNOSIS — Z7982 Long term (current) use of aspirin: Secondary | ICD-10-CM | POA: Insufficient documentation

## 2012-11-17 DIAGNOSIS — R51 Headache: Secondary | ICD-10-CM | POA: Insufficient documentation

## 2012-11-17 DIAGNOSIS — Z8739 Personal history of other diseases of the musculoskeletal system and connective tissue: Secondary | ICD-10-CM | POA: Insufficient documentation

## 2012-11-17 DIAGNOSIS — R404 Transient alteration of awareness: Secondary | ICD-10-CM | POA: Insufficient documentation

## 2012-11-17 DIAGNOSIS — Z862 Personal history of diseases of the blood and blood-forming organs and certain disorders involving the immune mechanism: Secondary | ICD-10-CM | POA: Insufficient documentation

## 2012-11-17 DIAGNOSIS — E079 Disorder of thyroid, unspecified: Secondary | ICD-10-CM | POA: Insufficient documentation

## 2012-11-17 DIAGNOSIS — E669 Obesity, unspecified: Secondary | ICD-10-CM | POA: Insufficient documentation

## 2012-11-17 DIAGNOSIS — Z880 Allergy status to penicillin: Secondary | ICD-10-CM | POA: Insufficient documentation

## 2012-11-17 DIAGNOSIS — M7989 Other specified soft tissue disorders: Secondary | ICD-10-CM | POA: Insufficient documentation

## 2012-11-17 LAB — URINALYSIS, ROUTINE W REFLEX MICROSCOPIC
Bilirubin Urine: NEGATIVE
Hgb urine dipstick: NEGATIVE
Ketones, ur: NEGATIVE mg/dL
Protein, ur: NEGATIVE mg/dL
Urobilinogen, UA: 0.2 mg/dL (ref 0.0–1.0)

## 2012-11-17 LAB — URINE MICROSCOPIC-ADD ON

## 2012-11-17 LAB — CBC WITH DIFFERENTIAL/PLATELET
Eosinophils Absolute: 0.3 10*3/uL (ref 0.0–0.7)
Eosinophils Relative: 3 % (ref 0–5)
Lymphs Abs: 1.5 10*3/uL (ref 0.7–4.0)
MCH: 30.2 pg (ref 26.0–34.0)
MCV: 86.6 fL (ref 78.0–100.0)
Monocytes Absolute: 0.8 10*3/uL (ref 0.1–1.0)
Platelets: 257 10*3/uL (ref 150–400)
RBC: 4.77 MIL/uL (ref 3.87–5.11)

## 2012-11-17 LAB — BASIC METABOLIC PANEL
BUN: 15 mg/dL (ref 6–23)
Calcium: 9.3 mg/dL (ref 8.4–10.5)
Creatinine, Ser: 0.7 mg/dL (ref 0.50–1.10)
GFR calc non Af Amer: 85 mL/min — ABNORMAL LOW (ref >90)
Glucose, Bld: 111 mg/dL — ABNORMAL HIGH (ref 70–99)
Sodium: 140 mEq/L (ref 135–145)

## 2012-11-17 MED ORDER — MECLIZINE HCL 12.5 MG PO TABS: 12.5000 mg | ORAL_TABLET | Freq: Three times a day (TID) | ORAL | Status: DC | PRN

## 2012-11-17 MED ORDER — NITROFURANTOIN MONOHYD MACRO 100 MG PO CAPS: 100.0000 mg | ORAL_CAPSULE | Freq: Two times a day (BID) | ORAL | Status: DC

## 2012-11-17 NOTE — ED Notes (Signed)
C/o episode of dizziness x 1 last week and again today approx 12pm after getting out of car

## 2012-11-17 NOTE — ED Provider Notes (Addendum)
History     CSN: [id number]  Arrival date & time 11/17/12  1810   First MD Initiated Contact with Patient 11/17/12 1824      Chief Complaint  Patient presents with  . Dizziness    (Consider location/radiation/quality/duration/timing/severity/associated sxs/prior treatment) Patient is a 72 y.o. female presenting with neurologic complaint. The history is provided by the patient.  Neurologic Problem The primary symptoms include dizziness. Primary symptoms do not include headaches, loss of consciousness, visual change, focal weakness, loss of sensation, speech change, fever, nausea or vomiting. The symptoms began 6 to 12 hours ago. The episode lasted 1 minute (pt states she got out of her car today and develped severe dizziness that she describes as vertigo like she was going to fall and sat down in her car with improvement in sx in about but since that time has felt  "off" all day long.  ). The symptoms are improving. The neurological symptoms are diffuse. The symptoms occurred after standing up (when getting out of the car).  She describes the dizziness as a sensation of spinning. The dizziness began today. The dizziness has been gradually improving since its onset. Dizziness is a recurrent (1 episode last week when laid down in the bed with severe spinning ) problem. Dizziness does not occur with blurred vision, hearing loss, nausea, vomiting or weakness.  Additional symptoms do not include neck stiffness, weakness, lower back pain, leg pain, loss of balance or photophobia. Medical issues also include hypertension. Medical issues do not include seizures, cerebral vascular accident, cancer, alcohol use, diabetes or recent surgery. Workup history does not include MRI, CT scan or EEG.    Past Medical History  Diagnosis Date  . Thyroid disease   . Hypertension   . Vitamin D deficiency   . Chicken pox as a child  . Measles as a child  . Mumps as a child  . Osteoporosis   . Obesity   .  Kidney stone 2000    nephritis as a child  . Allergy   . H/O breast biopsy     benign, on left  . Arthritis     osteoarthritis, severe, b/l knees &b/l hips  replaced  . Skin lesion of face 05/18/2012    Past Surgical History  Procedure Laterality Date  . Knee surgery      both knees replaced  . Hip surgery      both hips    Family History  Problem Relation Age of Onset  . Hyperlipidemia Mother   . Hypertension Mother   . Heart attack Mother   . Cancer Father     lung- smoker  . Heart attack Father     X 2  . Thyroid disease Brother   . Hypertension Son   . Cancer Paternal Grandmother     History  Substance Use Topics  . Smoking status: Never Smoker   . Smokeless tobacco: Never Used  . Alcohol Use: Yes     Comment: Occasional glass of wine    OB History   Grav Para Term Preterm Abortions TAB SAB Ect Mult Living                  Review of Systems  Constitutional: Negative for fever.  HENT: Positive for rhinorrhea. Negative for trouble swallowing, neck pain and neck stiffness.   Eyes: Negative for blurred vision, photophobia and visual disturbance.  Respiratory: Negative for cough and shortness of breath.   Cardiovascular: Negative for palpitations.  Always has mild leg swelling  Gastrointestinal: Negative for nausea, vomiting and abdominal pain.  Neurological: Positive for dizziness. Negative for speech change, focal weakness, loss of consciousness, weakness, headaches and loss of balance.  All other systems reviewed and are negative.    Allergies  Eggs or egg-derived products; Sulfa antibiotics; and Penicillins  Home Medications   Current Outpatient Rx  Name  Route  Sig  Dispense  Refill  . aspirin 81 MG tablet   Oral   Take 81 mg by mouth daily.         . Ascorbic Acid (VITAMIN C) 1000 MG tablet   Oral   Take 1,000 mg by mouth daily.         . Cholecalciferol (VITAMIN D) 2000 UNITS tablet   Oral   Take 2,000 Units by mouth daily.          . Coenzyme Q10 (CO Q 10) 100 MG CAPS   Oral   Take 400 mg by mouth daily.         . cyclobenzaprine (FLEXERIL) 10 MG tablet   Oral   Take 1 tablet (10 mg total) by mouth at bedtime as needed for muscle spasms.   30 tablet   1   . furosemide (LASIX) 20 MG tablet   Oral   Take 1 tablet (20 mg total) by mouth daily as needed.   30 tablet   3   . lisinopril-hydrochlorothiazide (PRINZIDE,ZESTORETIC) 20-12.5 MG per tablet   Oral   Take 1 tablet by mouth daily.   90 tablet   1   . Omega-3 Fatty Acids (FISH OIL) 1000 MG CPDR   Oral   Take 1 capsule by mouth daily.         Marland Kitchen SYNTHROID 150 MCG tablet   Oral   Take 1 tablet (150 mcg total) by mouth daily before breakfast.   30 tablet   3     Dispense as written.     BP 161/65  Pulse 94  Temp(Src) 98.9 F (37.2 C) (Oral)  Resp 18  Ht 5\' 2"  (1.575 m)  Wt 200 lb (90.719 kg)  BMI 36.57 kg/m2  SpO2 97%  Physical Exam  Nursing note and vitals reviewed. Constitutional: She is oriented to person, place, and time. She appears well-developed and well-nourished. No distress.  HENT:  Head: Normocephalic and atraumatic.  Bilateral TMs obstructed by cerumen  Eyes: EOM are normal. Pupils are equal, round, and reactive to light.  No nystagmus  Neck: Carotid bruit is not present.  Cardiovascular: Normal rate, regular rhythm, normal heart sounds and intact distal pulses.  Exam reveals no friction rub.   No murmur heard. Pulmonary/Chest: Effort normal and breath sounds normal. She has no wheezes. She has no rales.  Abdominal: Soft. Bowel sounds are normal. She exhibits no distension. There is no tenderness. There is no rebound and no guarding.  Musculoskeletal: Normal range of motion. She exhibits no tenderness.  No edema  Neurological: She is alert and oriented to person, place, and time. She has normal strength. No cranial nerve deficit or sensory deficit. Coordination and gait normal.  Normal gait without ataxia   Skin: Skin is warm and dry. No rash noted.  Psychiatric: She has a normal mood and affect. Her behavior is normal.    ED Course  Procedures (including critical care time)  Labs Reviewed  BASIC METABOLIC PANEL - Abnormal; Notable for the following:    Glucose, Bld 111 (*)    GFR  calc non Af Amer 85 (*)    All other components within normal limits  URINALYSIS, ROUTINE W REFLEX MICROSCOPIC - Abnormal; Notable for the following:    APPearance CLOUDY (*)    Leukocytes, UA MODERATE (*)    All other components within normal limits  URINE MICROSCOPIC-ADD ON - Abnormal; Notable for the following:    Bacteria, UA FEW (*)    All other components within normal limits  URINE CULTURE  CBC WITH DIFFERENTIAL  TROPONIN I   Ct Head Wo Contrast  11/17/2012   *RADIOLOGY REPORT*  Clinical Data: Dizziness.  CT HEAD WITHOUT CONTRAST  Technique:  Contiguous axial images were obtained from the base of the skull through the vertex without contrast.  Comparison: None.  Findings: No acute intracranial abnormality.  Specifically, no hemorrhage, hydrocephalus, mass lesion, acute infarction, or significant intracranial injury.  No acute calvarial abnormality. Visualized paranasal sinuses and mastoids clear.  Orbital soft tissues unremarkable.  IMPRESSION: No acute intracranial abnormality.   Original Report Authenticated By: Charlett Nose, M.D.     Date: 11/17/2012  Rate: 89  Rhythm: normal sinus rhythm  QRS Axis: normal  Intervals: normal  ST/T Wave abnormalities: normal  Conduction Disutrbances: none  Narrative Interpretation: unremarkable      1. Benign paroxysmal positional vertigo       MDM    Pt with sx most consistent with peripheral vertigo. One episode that occurred last week when she laid down in bed and moved her head with severe spinning that resolved. Today occurred again after getting out of the car causing her to sit back down into the car. Since that happened she states she's felt"  off " and worse with moving her head or walking.  No systemic or infectious sx.  Normal neuro exam without weakness, ataxia or cerebellar findings on exam.  Normal vision.  Sx are reproducible with movement of the head and attempting to walk.  No hx of Stroke and low likelihood but risk factors include HTN and age.  No neck pain or recent neck manipulation.  Also states has had some sinus sx with weather changes.  No recent med changes. EKG within normal limits. CBC, BMP, troponin, UA, head CT pending.  7:56 PM Patient's labs within normal limits except her UA showing moderate leukocytes in 7-10 white blood cells. When patient questioned about this she did admit to urinary frequency recently and will start her on Macrobid. Also head CT was negative for acute pathology. She states she had exacerbation of her symptoms and another episode when she tried to sit up from the CT table.  She was given strict return precautions and given meclizine. She will follow up with her doctor Monday if still symptomatic      Gwyneth Sprout, MD 11/17/12 1958  Gwyneth Sprout, MD 11/17/12 2000

## 2012-11-17 NOTE — ED Notes (Signed)
MD at bedside. 

## 2012-11-18 ENCOUNTER — Telehealth: Payer: Self-pay | Admitting: *Deleted

## 2012-11-18 NOTE — Telephone Encounter (Signed)
noted 

## 2012-11-18 NOTE — Telephone Encounter (Signed)
Noted  

## 2012-11-18 NOTE — Telephone Encounter (Signed)
Pt left message with call nurse at 5:30 on 11/17/13 re: dizziness. BP reading at home 130/115. Still doesn't feel right, has muscle cramping. Pt was advised to go to Urgent Care.  See 11/17/13 ER note.

## 2012-11-19 LAB — URINE CULTURE

## 2012-11-20 ENCOUNTER — Telehealth (HOSPITAL_COMMUNITY): Payer: Self-pay | Admitting: Emergency Medicine

## 2012-11-20 NOTE — ED Notes (Signed)
Notified patient of + urine culture. Patient reports that she is taking Macrobid as prescribed and symptoms have improved.

## 2012-11-20 NOTE — Progress Notes (Signed)
ED Antimicrobial Stewardship Positive Culture Follow Up   Graclynn Vanantwerp is an 72 y.o. female who presented to [facility] on 11/17/2012 with a chief complaint of  Chief Complaint  Patient presents with  . Dizziness    Recent Results (from the past 720 hour(s))  URINE CULTURE     Status: None   Collection Time    11/17/12  7:00 PM      Result Value Range Status   Specimen Description URINE, CLEAN CATCH   Final   Special Requests NONE   Final   Culture  Setup Time 11/18/2012 01:01   Final   Colony Count >=100,000 COLONIES/ML   Final   Culture KLEBSIELLA PNEUMONIAE   Final   Report Status 11/19/2012 FINAL   Final   Organism ID, Bacteria KLEBSIELLA PNEUMONIAE   Final    [x]  Treated with nitrofurantoin, organism resistant to prescribed antimicrobial []  Patient discharged originally without antimicrobial agent and treatment is now indicated  New antibiotic prescription: will check for symptoms of dysuria, if yes, recommend to stop nitrofurantoin and start ciprofloxacin 250mg  PO BID x 3 days  ED Provider: Tyler Deis, PA-C   Laurence Slate 11/20/2012, 12:54 PM Infectious Diseases Pharmacist Phone# [phone]

## 2012-11-20 NOTE — ED Notes (Signed)
Post ED Visit - Positive Culture Follow-up: Successful Patient Follow-Up  Culture assessed and recommendations reviewed by: []  Wes Dulaney, Pharm.D., BCPS []  Celedonio Miyamoto, Pharm.D., BCPS []  Georgina Pillion, [address].D., BCPS []  [place], [place].D., BCPS, AAHIVP []  Estella Husk, Pharm.D., BCPS, AAHIVP [x]  Laurence Slate, [address].D.  Positive urine culture   []  Patient discharged without antimicrobial prescription and treatment is now indicated [x]  Organism is intermediate to prescribed ED discharge antimicrobial []  Patient with positive blood cultures  Changes discussed with ED provider: Francee Piccolo PA-C New antibiotic prescription: Cipro 250 mg BID x 3 days if symptomatic    Kylie A Holland 11/20/2012, 2:35 PM

## 2012-11-21 ENCOUNTER — Telehealth: Payer: Self-pay | Admitting: Family Medicine

## 2012-11-21 MED ORDER — CIPROFLOXACIN HCL 250 MG PO TABS: 250.0000 mg | ORAL_TABLET | Freq: Two times a day (BID) | ORAL | Status: DC

## 2012-11-21 NOTE — Telephone Encounter (Signed)
Please advise 

## 2012-11-21 NOTE — Telephone Encounter (Signed)
Patient states that she was seen last week at the ED and was diagnosed with a UTI. She says that the ED gave her a 5 day antibiotic. Patient does say that she is feeling better but is still having frequent urination and would like to know what Dr. Abner Greenspan recommends she do?

## 2012-11-21 NOTE — Telephone Encounter (Signed)
Patient Information:  Caller Name: [name]  Phone: [phone]  Patient: Felicia Lowe, Felicia Lowe  Gender: Female  DOB: [birth year]  Age: 72 Years  PCP: Danise Edge [facility])  Office Follow Up:  Does the office need to follow up with this patient?: Yes  Instructions For The Office: Call back regarding appt.  RN Note:  On last day of antibiotic for UTI diagnosed at [facility].  Continues to have urinary frequency with void every 2 hours. Is drinking more fluids. Denies urgency, hematuria or dysuria. Urine color lemon yellow with cloudiness.  UC staff called to see how she was doing and mentioned she may need more or different antibiotic if symptoms continued.  Per Epic, C/S done at [facility] 11/17/12 showed Nitrofurointoin with ntermediate sensitive. Declined appointment to See Dr Milinda Cave.  Requests appointment to see Dr Rogelia Rohrer even if it is at "the other office."  Please follow up with patient to ensure she is scheduled with an MD.   Symptoms  Reason For Call & Symptoms: Ongoing urinary frequency and intermittent dizziness.  [facility] staff called 11/20/12 to follow up; mentioned might need more antibiotic.  Called to ask if that is recommended since she still has some urinary symptoms.  Reviewed Health History In EMR: Yes  Reviewed Medications In EMR: Yes  Reviewed Allergies In EMR: Yes  Reviewed Surgeries / Procedures: Yes  Date of Onset of Symptoms: 11/07/2012  Treatments Tried: Nitrofurantoin 100 mg po BID for 5 days beginning 11/17/12  Treatments Tried Worked: No  Guideline(s) Used:  Urinalysis Results Follow-Up Call  Urination Pain - Female  Disposition Per Guideline:   See Today in Office  Reason For Disposition Reached:   Age > 50 years  Advice Given:  N/A  Patient Will Follow Care Advice:  YES

## 2012-11-21 NOTE — Telephone Encounter (Signed)
Reviewed UA c & s can send in Ciprofloxacin 250 mg 1 tab po bid x 5 days, if no improvement will have to leave another urine sample

## 2012-11-21 NOTE — Telephone Encounter (Signed)
Addressed in previous phone note.  

## 2012-11-21 NOTE — Telephone Encounter (Signed)
RX sent and I left a message on answering machine to return my call

## 2012-11-22 ENCOUNTER — Other Ambulatory Visit: Payer: Self-pay | Admitting: Family Medicine

## 2012-11-22 NOTE — Telephone Encounter (Signed)
Rx sent in to pharmacy. 

## 2012-11-22 NOTE — Telephone Encounter (Signed)
Patient informed and voiced understanding

## 2012-12-07 ENCOUNTER — Encounter: Payer: Self-pay | Admitting: Family Medicine

## 2012-12-07 ENCOUNTER — Ambulatory Visit (INDEPENDENT_AMBULATORY_CARE_PROVIDER_SITE_OTHER): Payer: Medicare HMO | Admitting: Family Medicine

## 2012-12-07 VITALS — BP 128/70 | HR 86 | Temp 98.5°F | Ht 61.25 in | Wt 210.0 lb

## 2012-12-07 DIAGNOSIS — N390 Urinary tract infection, site not specified: Secondary | ICD-10-CM

## 2012-12-07 DIAGNOSIS — M129 Arthropathy, unspecified: Secondary | ICD-10-CM

## 2012-12-07 DIAGNOSIS — I10 Essential (primary) hypertension: Secondary | ICD-10-CM

## 2012-12-07 DIAGNOSIS — M1990 Unspecified osteoarthritis, unspecified site: Secondary | ICD-10-CM

## 2012-12-07 NOTE — Patient Instructions (Addendum)
Cranberry tabs, 64 oz of clear fluids Consider Digestive Advantage probiotics  Prior next visit, lipid, renal, cbc, tsh, hepatic   Urinary Tract Infection Urinary tract infections (UTIs) can develop anywhere along your urinary tract. Your urinary tract is your body's drainage system for removing wastes and extra water. Your urinary tract includes two kidneys, two ureters, a bladder, and a urethra. Your kidneys are a pair of bean-shaped organs. Each kidney is about the size of your fist. They are located below your ribs, one on each side of your spine. CAUSES Infections are caused by microbes, which are microscopic organisms, including fungi, viruses, and bacteria. These organisms are so small that they can only be seen through a microscope. Bacteria are the microbes that most commonly cause UTIs. SYMPTOMS  Symptoms of UTIs may vary by age and gender of the patient and by the location of the infection. Symptoms in young women typically include a frequent and intense urge to urinate and a painful, burning feeling in the bladder or urethra during urination. Older women and men are more likely to be tired, shaky, and weak and have muscle aches and abdominal pain. A fever may mean the infection is in your kidneys. Other symptoms of a kidney infection include pain in your back or sides below the ribs, nausea, and vomiting. DIAGNOSIS To diagnose a UTI, your caregiver will ask you about your symptoms. Your caregiver also will ask to provide a urine sample. The urine sample will be tested for bacteria and white blood cells. White blood cells are made by your body to help fight infection. TREATMENT  Typically, UTIs can be treated with medication. Because most UTIs are caused by a bacterial infection, they usually can be treated with the use of antibiotics. The choice of antibiotic and length of treatment depend on your symptoms and the type of bacteria causing your infection. HOME CARE INSTRUCTIONS  If you  were prescribed antibiotics, take them exactly as your caregiver instructs you. Finish the medication even if you feel better after you have only taken some of the medication.  Drink enough water and fluids to keep your urine clear or pale yellow.  Avoid caffeine, tea, and carbonated beverages. They tend to irritate your bladder.  Empty your bladder often. Avoid holding urine for long periods of time.  Empty your bladder before and after sexual intercourse.  After a bowel movement, women should cleanse from front to back. Use each tissue only once. SEEK MEDICAL CARE IF:   You have back pain.  You develop a fever.  Your symptoms do not begin to resolve within 3 days. SEEK IMMEDIATE MEDICAL CARE IF:   You have severe back pain or lower abdominal pain.  You develop chills.  You have nausea or vomiting.  You have continued burning or discomfort with urination. MAKE SURE YOU:   Understand these instructions.  Will watch your condition.  Will get help right away if you are not doing well or get worse. Document Released: 04/01/2005 Document Revised: 12/22/2011 Document Reviewed: 07/31/2011 [facility] Patient Information 2014 [place], [place].

## 2012-12-07 NOTE — Progress Notes (Signed)
Patient ID: Felicia Lowe, female   DOB: 28-Feb-1941, 72 y.o.   MRN: [id number] Felicia Lowe [id number] 08-16-1940 12/07/2012      Progress Note-Follow Up  Subjective  Chief Complaint  Chief Complaint  Patient presents with  . Follow-up    HPI  Patient is a 72 year old Caucasian female who is in today for followup overall doing well except for her ongoing chronic pain. She's recently treated for a UTI. No urinary symptoms. Recently treated with Macrobid and Cipro now no dysuria or hematuria. No chest pain, palpitations, fevers, shortness of breath, GI complaints. Is struggling most was left shoulder pain as recently started some physical therapy and Celebrex) marginally helpful  Past Medical History  Diagnosis Date  . Thyroid disease   . Hypertension   . Vitamin D deficiency   . Chicken pox as a child  . Measles as a child  . Mumps as a child  . Osteoporosis   . Obesity   . Kidney stone 2000    nephritis as a child  . Allergy   . H/O breast biopsy     benign, on left  . Arthritis     osteoarthritis, severe, b/l knees &b/l hips  replaced  . Skin lesion of face 05/18/2012    Past Surgical History  Procedure Laterality Date  . Knee surgery      both knees replaced  . Hip surgery      both hips    Family History  Problem Relation Age of Onset  . Hyperlipidemia Mother   . Hypertension Mother   . Heart attack Mother   . Cancer Father     lung- smoker  . Heart attack Father     X 2  . Thyroid disease Brother   . Hypertension Son   . Cancer Paternal Grandmother     History   Social History  . Marital Status: Unknown    Spouse Name: N/A    Number of Children: N/A  . Years of Education: N/A   Occupational History  . Not on file.   Social History Main Topics  . Smoking status: Never Smoker   . Smokeless tobacco: Never Used  . Alcohol Use: Yes     Comment: Occasional glass of wine  . Drug Use: No  . Sexually Active: Not on file   Other Topics Concern  .  Not on file   Social History Narrative  . No narrative on file    Current Outpatient Prescriptions on File Prior to Visit  Medication Sig Dispense Refill  . Ascorbic Acid (VITAMIN C) 1000 MG tablet Take 1,000 mg by mouth daily.      Marland Kitchen aspirin 81 MG tablet Take 81 mg by mouth daily.      . Cholecalciferol (VITAMIN D) 2000 UNITS tablet Take 2,000 Units by mouth daily.      . Coenzyme Q10 (CO Q 10) 100 MG CAPS Take 400 mg by mouth daily.      . cyclobenzaprine (FLEXERIL) 10 MG tablet Take 1 tablet (10 mg total) by mouth at bedtime as needed for muscle spasms.  30 tablet  1  . furosemide (LASIX) 20 MG tablet Take 1 tablet (20 mg total) by mouth daily as needed.  30 tablet  3  . lisinopril-hydrochlorothiazide (PRINZIDE,ZESTORETIC) 20-12.5 MG per tablet Take 1 tablet by mouth daily.  90 tablet  1  . meclizine (ANTIVERT) 12.5 MG tablet Take 1 tablet (12.5 mg total) by mouth 3 (three) times daily as needed  for dizziness.  15 tablet  0  . nitrofurantoin, macrocrystal-monohydrate, (MACROBID) 100 MG capsule Take 1 capsule (100 mg total) by mouth 2 (two) times daily.  10 capsule  0  . Omega-3 Fatty Acids (FISH OIL) 1000 MG CPDR Take 1 capsule by mouth daily.      Marland Kitchen SYNTHROID 150 MCG tablet Take 1 tablet (150 mcg total) by mouth daily before breakfast.  30 tablet  3   No current facility-administered medications on file prior to visit.    Allergies  Allergen Reactions  . Eggs Or Egg-Derived Products   . Sulfa Antibiotics Nausea And Vomiting  . Penicillins Rash    Review of Systems  Review of Systems  Constitutional: Negative for fever and malaise/fatigue.  HENT: Negative for congestion.   Eyes: Negative for discharge.  Respiratory: Negative for shortness of breath.   Cardiovascular: Negative for chest pain, palpitations and leg swelling.  Gastrointestinal: Negative for nausea, abdominal pain and diarrhea.  Genitourinary: Positive for frequency. Negative for dysuria.  Musculoskeletal:  Positive for joint pain. Negative for falls.  Skin: Negative for rash.  Neurological: Negative for loss of consciousness and headaches.  Endo/Heme/Allergies: Negative for polydipsia.  Psychiatric/Behavioral: Negative for depression and suicidal ideas. The patient is not nervous/anxious and does not have insomnia.     Objective  BP 128/70  Pulse 86  Temp(Src) 98.5 F (36.9 C) (Oral)  Ht 5' 1.25" (1.556 m)  Wt 210 lb 0.6 oz (95.274 kg)  BMI 39.35 kg/m2  SpO2 97%  Physical Exam  Physical Exam  Constitutional: She is oriented to person, place, and time and well-developed, well-nourished, and in no distress. No distress.  HENT:  Head: Normocephalic and atraumatic.  Eyes: Conjunctivae are normal.  Neck: Neck supple. No thyromegaly present.  Cardiovascular: Normal rate, regular rhythm and normal heart sounds.   No murmur heard. Pulmonary/Chest: Effort normal and breath sounds normal. She has no wheezes.  Abdominal: She exhibits no distension and no mass.  Musculoskeletal: She exhibits no edema.  Lymphadenopathy:    She has no cervical adenopathy.  Neurological: She is alert and oriented to person, place, and time.  Skin: Skin is warm and dry. No rash noted. She is not diaphoretic.  Psychiatric: Memory, affect and judgment normal.    Lab Results  Component Value Date   TSH 0.59 09/16/2012   Lab Results  Component Value Date   WBC 7.8 11/17/2012   HGB 14.4 11/17/2012   HCT 41.3 11/17/2012   MCV 86.6 11/17/2012   PLT 257 11/17/2012   Lab Results  Component Value Date   CREATININE 0.70 11/17/2012   BUN 15 11/17/2012   NA 140 11/17/2012   K 4.1 11/17/2012   CL 103 11/17/2012   CO2 25 11/17/2012   Lab Results  Component Value Date   ALT 16 02/08/2012   AST 18 02/08/2012   ALKPHOS 58 02/08/2012   BILITOT 0.4 02/08/2012     Assessment & Plan  UTI (urinary tract infection) Encouraged probiotics, hydration and cranberry. Asymptomatic at this time and the culture appears  contaminated. Will repeat if symtpoms return.   Hypertension Well controlled on current meds no changes.  Arthritis Is following with orthopaedics, Dr Julius Bowels and her rheumatologist at [facility]. Has just started on Celebrex, may try Turmeric as well.

## 2012-12-08 LAB — URINALYSIS
Bilirubin Urine: NEGATIVE
Ketones, ur: NEGATIVE mg/dL
Nitrite: NEGATIVE
Specific Gravity, Urine: 1.015 (ref 1.005–1.030)
pH: 6 (ref 5.0–8.0)

## 2012-12-09 LAB — URINE CULTURE: Colony Count: 40000

## 2012-12-09 NOTE — Progress Notes (Signed)
Quick Note:  Patient Informed and voiced understanding.  Pt states she is not having any symptoms ______

## 2012-12-11 ENCOUNTER — Encounter: Payer: Self-pay | Admitting: Family Medicine

## 2012-12-11 DIAGNOSIS — N390 Urinary tract infection, site not specified: Secondary | ICD-10-CM

## 2012-12-11 HISTORY — DX: Urinary tract infection, site not specified: N39.0

## 2012-12-11 NOTE — Assessment & Plan Note (Signed)
Well controlled on current meds no changes 

## 2012-12-11 NOTE — Assessment & Plan Note (Signed)
Is following with orthopaedics, Dr Julius Bowels and her rheumatologist at [facility]. Has just started on Celebrex, may try Turmeric as well.

## 2012-12-11 NOTE — Assessment & Plan Note (Signed)
Encouraged probiotics, hydration and cranberry. Asymptomatic at this time and the culture appears contaminated. Will repeat if symtpoms return.

## 2013-01-30 ENCOUNTER — Ambulatory Visit (INDEPENDENT_AMBULATORY_CARE_PROVIDER_SITE_OTHER): Payer: Medicare HMO | Admitting: Family

## 2013-01-30 ENCOUNTER — Encounter: Payer: Self-pay | Admitting: Family

## 2013-01-30 ENCOUNTER — Telehealth: Payer: Self-pay | Admitting: *Deleted

## 2013-01-30 VITALS — BP 110/78 | HR 76 | Temp 98.0°F | Resp 18 | Wt 205.1 lb

## 2013-01-30 DIAGNOSIS — N390 Urinary tract infection, site not specified: Secondary | ICD-10-CM

## 2013-01-30 DIAGNOSIS — R350 Frequency of micturition: Secondary | ICD-10-CM

## 2013-01-30 LAB — POCT URINALYSIS DIPSTICK
Ketones, UA: NEGATIVE
Protein, UA: NEGATIVE
Spec Grav, UA: 1.01
Urobilinogen, UA: 0.2
pH, UA: 6.5

## 2013-01-30 MED ORDER — CIPROFLOXACIN HCL 250 MG PO TABS: 250.0000 mg | ORAL_TABLET | Freq: Two times a day (BID) | ORAL | Status: DC

## 2013-01-30 NOTE — Patient Instructions (Addendum)
Call if symptoms worsen or if not improved in 2-3 days.  

## 2013-01-30 NOTE — Telephone Encounter (Signed)
[facility] Triage Call Report Triage Record Num: [id number] Operator: Chevis Pretty Patient Name: Felicia Lowe Call Date & Time: 01/29/2013 11:56:51AM Patient Phone: [phone] PCP: Joaquin Courts Patient Gender: Female PCP Fax : [phone] Patient DOB: [birth year] Practice Name: [facility] Reason for Call: Caller: Bradleigh/Patient; PCP: Danise Edge (Family Practice); CB#: [phone]; Call regarding Urinary symptoms. States onset of symptoms 01/29/13 on arising. States she has dizzness, cloudy urine. Denies urgency, frequency. States when she was last treated for UTI, she did not have symptoms except for mild dizziness, and was treated with macrobid. Allergic to PCN, sulfa. Taking fluids well. Afebrile. Per dizziness protocol and urinary symptoms protocol, emergent symptoms denied; advised appt within 24 hours. Appt scheduled 0930 01/30/13 with Ms. O'Sullivan in [facility] office. Protocol(s) Used: Dizziness or Vertigo Recommended Outcome per Protocol: See Provider within 24 hours Reason for Outcome: Symptoms worsen with movement of head or looking up AND not previously evaluated

## 2013-01-30 NOTE — Assessment & Plan Note (Signed)
UA notes trace leuks, trace blood.  We discussed importance of proper wiping front to back to decrease risk of UTI.  Send urine for culture.  Start cipro.

## 2013-01-30 NOTE — Progress Notes (Signed)
Subjective:    Patient ID: Felicia Lowe, female    DOB: [birth year], 72 y.o.   MRN: [id number]  HPI  Felicia Lowe is a 72 yr old female who presents today with chief complaint of urinary frequency. She also reports associated dizziness. Urinary frequency started 1 week ago. The dizziness started yesterday morning.  She reports that water intake helped with her dizziness.  She reports mild subjective temp last night.   Reports similar symptoms in the past with UTI.   Review of Systems See HPI  Past Medical History  Diagnosis Date  . Thyroid disease   . Hypertension   . Vitamin D deficiency   . Chicken pox as a child  . Measles as a child  . Mumps as a child  . Osteoporosis   . Obesity   . Kidney stone 2000    nephritis as a child  . Allergy   . H/O breast biopsy     benign, on left  . Arthritis     osteoarthritis, severe, b/l knees &b/l hips  replaced  . Skin lesion of face 05/18/2012  . UTI (urinary tract infection) 12/11/2012    History   Social History  . Marital Status: Unknown    Spouse Name: N/A    Number of Children: N/A  . Years of Education: N/A   Occupational History  . Not on file.   Social History Main Topics  . Smoking status: Never Smoker   . Smokeless tobacco: Never Used  . Alcohol Use: Yes     Comment: Occasional glass of wine  . Drug Use: No  . Sexually Active: Not on file   Other Topics Concern  . Not on file   Social History Narrative  . No narrative on file    Past Surgical History  Procedure Laterality Date  . Knee surgery      both knees replaced  . Hip surgery      both hips    Family History  Problem Relation Age of Onset  . Hyperlipidemia Mother   . Hypertension Mother   . Heart attack Mother   . Cancer Father     lung- smoker  . Heart attack Father     X 2  . Thyroid disease Brother   . Hypertension Son   . Cancer Paternal Grandmother     Allergies  Allergen Reactions  . Eggs Or Egg-Derived Products   . Sulfa  Antibiotics Nausea And Vomiting  . Penicillins Rash    Current Outpatient Prescriptions on File Prior to Visit  Medication Sig Dispense Refill  . Ascorbic Acid (VITAMIN C) 1000 MG tablet Take 1,000 mg by mouth daily.      Marland Kitchen aspirin 81 MG tablet Take 81 mg by mouth daily.      . Cholecalciferol (VITAMIN D) 2000 UNITS tablet Take 2,000 Units by mouth daily.      . Coenzyme Q10 (CO Q 10) 100 MG CAPS Take 400 mg by mouth daily.      . cyclobenzaprine (FLEXERIL) 10 MG tablet Take 1 tablet (10 mg total) by mouth at bedtime as needed for muscle spasms.  30 tablet  1  . furosemide (LASIX) 20 MG tablet Take 1 tablet (20 mg total) by mouth daily as needed.  30 tablet  3  . lisinopril-hydrochlorothiazide (PRINZIDE,ZESTORETIC) 20-12.5 MG per tablet Take 1 tablet by mouth daily.  90 tablet  1  . Omega-3 Fatty Acids (FISH OIL) 1000 MG CPDR Take 1 capsule by  mouth daily.      Marland Kitchen SYNTHROID 150 MCG tablet Take 1 tablet (150 mcg total) by mouth daily before breakfast.  30 tablet  3  . meclizine (ANTIVERT) 12.5 MG tablet Take 1 tablet (12.5 mg total) by mouth 3 (three) times daily as needed for dizziness.  15 tablet  0  . nitrofurantoin, macrocrystal-monohydrate, (MACROBID) 100 MG capsule Take 1 capsule (100 mg total) by mouth 2 (two) times daily.  10 capsule  0   No current facility-administered medications on file prior to visit.    BP 110/78  Pulse 76  Temp(Src) 98 F (36.7 C) (Oral)  Resp 18  Wt 205 lb 1.3 oz (93.024 kg)  BMI 38.42 kg/m2  SpO2 99%       Objective:   Physical Exam  Constitutional: She is oriented to person, place, and time. She appears well-developed and well-nourished. No distress.  HENT:  Head: Normocephalic and atraumatic.  Bilateral TM's occluded by cerumen.  Cardiovascular: Normal rate and regular rhythm.   No murmur heard. Pulmonary/Chest: Effort normal and breath sounds normal. No respiratory distress. She has no wheezes. She has no rales. She exhibits no tenderness.   Abdominal: Soft. She exhibits no distension. There is no tenderness.  Genitourinary:  Neg CVAT bilaterally  Musculoskeletal: She exhibits no edema.  Lymphadenopathy:    She has no cervical adenopathy.  Neurological: She is alert and oriented to person, place, and time.  Psychiatric: She has a normal mood and affect. Her behavior is normal. Judgment and thought content normal.          Assessment & Plan:

## 2013-01-31 LAB — URINE CULTURE: Colony Count: 9000

## 2013-02-08 ENCOUNTER — Other Ambulatory Visit: Payer: Self-pay

## 2013-02-16 ENCOUNTER — Encounter: Payer: Self-pay | Admitting: Family Medicine

## 2013-02-16 ENCOUNTER — Ambulatory Visit (INDEPENDENT_AMBULATORY_CARE_PROVIDER_SITE_OTHER): Payer: Medicare HMO | Admitting: Family Medicine

## 2013-02-16 VITALS — BP 122/82 | HR 76 | Temp 98.0°F | Ht 61.25 in | Wt 208.0 lb

## 2013-02-16 DIAGNOSIS — E559 Vitamin D deficiency, unspecified: Secondary | ICD-10-CM

## 2013-02-16 DIAGNOSIS — M129 Arthropathy, unspecified: Secondary | ICD-10-CM

## 2013-02-16 DIAGNOSIS — N390 Urinary tract infection, site not specified: Secondary | ICD-10-CM

## 2013-02-16 DIAGNOSIS — M1990 Unspecified osteoarthritis, unspecified site: Secondary | ICD-10-CM

## 2013-02-16 DIAGNOSIS — E039 Hypothyroidism, unspecified: Secondary | ICD-10-CM

## 2013-02-16 DIAGNOSIS — R350 Frequency of micturition: Secondary | ICD-10-CM

## 2013-02-16 DIAGNOSIS — H8110 Benign paroxysmal vertigo, unspecified ear: Secondary | ICD-10-CM

## 2013-02-16 DIAGNOSIS — I10 Essential (primary) hypertension: Secondary | ICD-10-CM

## 2013-02-16 DIAGNOSIS — E669 Obesity, unspecified: Secondary | ICD-10-CM

## 2013-02-16 LAB — RENAL FUNCTION PANEL
BUN: 15 mg/dL (ref 6–23)
CO2: 30 mEq/L (ref 19–32)
Calcium: 9.9 mg/dL (ref 8.4–10.5)
Chloride: 102 mEq/L (ref 96–112)
Glucose, Bld: 83 mg/dL (ref 70–99)
Potassium: 4.3 mEq/L (ref 3.5–5.3)
Sodium: 139 mEq/L (ref 135–145)

## 2013-02-16 LAB — HEPATIC FUNCTION PANEL
ALT: 13 U/L (ref 0–35)
Alkaline Phosphatase: 63 U/L (ref 39–117)
Bilirubin, Direct: 0.1 mg/dL (ref 0.0–0.3)
Indirect Bilirubin: 0.3 mg/dL (ref 0.0–0.9)
Total Protein: 6.7 g/dL (ref 6.0–8.3)

## 2013-02-16 LAB — LIPID PANEL
HDL: 46 mg/dL (ref >39)
Total CHOL/HDL Ratio: 3.2 Ratio
VLDL: 23 mg/dL (ref 0–40)

## 2013-02-16 LAB — CBC
HCT: 40.7 % (ref 36.0–46.0)
Hemoglobin: 13.9 g/dL (ref 12.0–15.0)
RBC: 4.73 MIL/uL (ref 3.87–5.11)
WBC: 7 10*3/uL (ref 4.0–10.5)

## 2013-02-16 MED ORDER — LISINOPRIL-HYDROCHLOROTHIAZIDE 20-12.5 MG PO TABS: ORAL_TABLET | ORAL | Status: DC

## 2013-02-16 NOTE — Patient Instructions (Addendum)
Hypertension °As your heart beats, it forces blood through your arteries. This force is your blood pressure. If the pressure is too high, it is called hypertension (HTN) or high blood pressure. HTN is dangerous because you may have it and not know it. High blood pressure may mean that your heart has to work harder to pump blood. Your arteries may be narrow or stiff. The extra work puts you at risk for heart disease, stroke, and other problems.  °Blood pressure consists of two numbers, a higher number over a lower, 110/72, for example. It is stated as "110 over 72." The ideal is below 120 for the top number (systolic) and under 80 for the bottom (diastolic). Write down your blood pressure today. °You should pay close attention to your blood pressure if you have certain conditions such as: °· Heart failure. °· Prior heart attack. °· Diabetes °· Chronic kidney disease. °· Prior stroke. °· Multiple risk factors for heart disease. °To see if you have HTN, your blood pressure should be measured while you are seated with your arm held at the level of the heart. It should be measured at least twice. A one-time elevated blood pressure reading (especially in the Emergency Department) does not mean that you need treatment. There may be conditions in which the blood pressure is different between your right and left arms. It is important to see your caregiver soon for a recheck. °Most people have essential hypertension which means that there is not a specific cause. This type of high blood pressure may be lowered by changing lifestyle factors such as: °· Stress. °· Smoking. °· Lack of exercise. °· Excessive weight. °· Drug/tobacco/alcohol use. °· Eating less salt. °Most people do not have symptoms from high blood pressure until it has caused damage to the body. Effective treatment can often prevent, delay or reduce that damage. °TREATMENT  °When a cause has been identified, treatment for high blood pressure is directed at the  cause. There are a large number of medications to treat HTN. These fall into several categories, and your caregiver will help you select the medicines that are best for you. Medications may have side effects. You should review side effects with your caregiver. °If your blood pressure stays high after you have made lifestyle changes or started on medicines,  °· Your medication(s) may need to be changed. °· Other problems may need to be addressed. °· Be certain you understand your prescriptions, and know how and when to take your medicine. °· Be sure to follow up with your caregiver within the time frame advised (usually within two weeks) to have your blood pressure rechecked and to review your medications. °· If you are taking more than one medicine to lower your blood pressure, make sure you know how and at what times they should be taken. Taking two medicines at the same time can result in blood pressure that is too low. °SEEK IMMEDIATE MEDICAL CARE IF: °· You develop a severe headache, blurred or changing vision, or confusion. °· You have unusual weakness or numbness, or a faint feeling. °· You have severe chest or abdominal pain, vomiting, or breathing problems. °MAKE SURE YOU:  °· Understand these instructions. °· Will watch your condition. °· Will get help right away if you are not doing well or get worse. °Document Released: 06/22/2005 Document Revised: 09/14/2011 Document Reviewed: 02/10/2008 °[employer]® Patient Information ©2014 [employer]. ° °Vertigo °Vertigo means you feel like you or your surroundings are moving when they are   not. Vertigo can be dangerous if it occurs when you are at work, driving, or performing difficult activities.  °CAUSES  °Vertigo occurs when there is a conflict of signals sent to your brain from the visual and sensory systems in your body. There are many different causes of vertigo, including: °· Infections, especially in the inner ear. °· A bad reaction to a drug or misuse of  alcohol and medicines. °· Withdrawal from drugs or alcohol. °· Rapidly changing positions, such as lying down or rolling over in bed. °· A migraine headache. °· Decreased blood flow to the brain. °· Increased pressure in the brain from a head injury, infection, tumor, or bleeding. °SYMPTOMS  °You may feel as though the world is spinning around or you are falling to the ground. Because your balance is upset, vertigo can cause nausea and vomiting. You may have involuntary eye movements (nystagmus). °DIAGNOSIS  °Vertigo is usually diagnosed by physical exam. If the cause of your vertigo is unknown, your caregiver may perform imaging tests, such as an MRI scan (magnetic resonance imaging). °TREATMENT  °Most cases of vertigo resolve on their own, without treatment. Depending on the cause, your caregiver may prescribe certain medicines. If your vertigo is related to body position issues, your caregiver may recommend movements or procedures to correct the problem. In rare cases, if your vertigo is caused by certain inner ear problems, you may need surgery. °HOME CARE INSTRUCTIONS  °· Follow your caregiver's instructions. °· Avoid driving. °· Avoid operating heavy machinery. °· Avoid performing any tasks that would be dangerous to you or others during a vertigo episode. °· Tell your caregiver if you notice that certain medicines seem to be causing your vertigo. Some of the medicines used to treat vertigo episodes can actually make them worse in some people. °SEEK IMMEDIATE MEDICAL CARE IF:  °· Your medicines do not relieve your vertigo or are making it worse. °· You develop problems with talking, walking, weakness, or using your arms, hands, or legs. °· You develop severe headaches. °· Your nausea or vomiting continues or gets worse. °· You develop visual changes. °· A family member notices behavioral changes. °· Your condition gets worse. °MAKE SURE YOU: °· Understand these instructions. °· Will watch your  condition. °· Will get help right away if you are not doing well or get worse. °Document Released: 04/01/2005 Document Revised: 09/14/2011 Document Reviewed: 01/08/2011 °Exit[employer]® Patient Information ©2014 [employer]. ° °

## 2013-02-17 LAB — URINALYSIS
Bilirubin Urine: NEGATIVE
Glucose, UA: NEGATIVE mg/dL
Ketones, ur: NEGATIVE mg/dL
Specific Gravity, Urine: 1.011 (ref 1.005–1.030)
Urobilinogen, UA: 0.2 mg/dL (ref 0.0–1.0)

## 2013-02-17 LAB — T4, FREE: Free T4: 1.45 ng/dL (ref 0.80–1.80)

## 2013-02-17 LAB — URINE CULTURE
Colony Count: NO GROWTH
Organism ID, Bacteria: NO GROWTH

## 2013-02-17 LAB — VITAMIN D 25 HYDROXY (VIT D DEFICIENCY, FRACTURES): Vit D, 25-Hydroxy: 40 ng/mL (ref 30–89)

## 2013-02-19 ENCOUNTER — Encounter: Payer: Self-pay | Admitting: Family Medicine

## 2013-02-19 DIAGNOSIS — H8110 Benign paroxysmal vertigo, unspecified ear: Secondary | ICD-10-CM

## 2013-02-19 HISTORY — DX: Benign paroxysmal vertigo, unspecified ear: H81.10

## 2013-02-19 NOTE — Progress Notes (Signed)
Patient ID: Treina Arscott, female   DOB: 09-13-1940, 72 y.o.   MRN: [id number] Verlon Carcione [id number] Apr 29, 1941 02/19/2013      Progress Note-Follow Up  Subjective  Chief Complaint  Chief Complaint  Patient presents with  . Follow-up    3 month  . Dizziness    always when sitting down    HPI  Patient is a 72 year old Caucasian female today in followup. Overall doing well but has had a couple of episodes of feeling slightly lightheaded. She gets a sensation of spinning usually when she's quiet. It is not warm it. There is no nausea or headache involved. She's had no recent head trauma or fevers. Has some low-grade congestion but no rhinorrhea or cough. Denies chest pain or palpitations. No shortness of breath or GU complaints. Has been doing some physical therapy and some chiropractic massage with good results. Her back pain myalgias and joint pains are slowly improving and her ambulation is more steady.  Past Medical History  Diagnosis Date  . Thyroid disease   . Hypertension   . Vitamin D deficiency   . Chicken pox as a child  . Measles as a child  . Mumps as a child  . Osteoporosis   . Obesity   . Kidney stone 2000    nephritis as a child  . Allergy   . H/O breast biopsy     benign, on left  . Arthritis     osteoarthritis, severe, b/l knees &b/l hips  replaced  . Skin lesion of face 05/18/2012  . UTI (urinary tract infection) 12/11/2012  . BPV (benign positional vertigo) 02/19/2013    mild    Past Surgical History  Procedure Laterality Date  . Knee surgery      both knees replaced  . Hip surgery      both hips    Family History  Problem Relation Age of Onset  . Hyperlipidemia Mother   . Hypertension Mother   . Heart attack Mother   . Cancer Father     lung- smoker  . Heart attack Father     X 2  . Thyroid disease Brother   . Hypertension Son   . Cancer Paternal Grandmother     History   Social History  . Marital Status: Unknown    Spouse Name: N/A     Number of Children: N/A  . Years of Education: N/A   Occupational History  . Not on file.   Social History Main Topics  . Smoking status: Never Smoker   . Smokeless tobacco: Never Used  . Alcohol Use: Yes     Comment: Occasional glass of wine  . Drug Use: No  . Sexual Activity: Not on file   Other Topics Concern  . Not on file   Social History Narrative  . No narrative on file    Current Outpatient Prescriptions on File Prior to Visit  Medication Sig Dispense Refill  . Ascorbic Acid (VITAMIN C) 1000 MG tablet Take 1,000 mg by mouth daily.      Marland Kitchen aspirin 81 MG tablet Take 81 mg by mouth daily.      . Cholecalciferol (VITAMIN D) 2000 UNITS tablet Take 2,000 Units by mouth daily.      . Coenzyme Q10 (CO Q 10) 100 MG CAPS Take 400 mg by mouth daily.      . cyclobenzaprine (FLEXERIL) 10 MG tablet Take 1 tablet (10 mg total) by mouth at bedtime as needed for muscle spasms.  30 tablet  1  . furosemide (LASIX) 20 MG tablet Take 1 tablet (20 mg total) by mouth daily as needed.  30 tablet  3  . Omega-3 Fatty Acids (FISH OIL) 1000 MG CPDR Take 1 capsule by mouth daily.      Marland Kitchen SYNTHROID 150 MCG tablet Take 1 tablet (150 mcg total) by mouth daily before breakfast.  30 tablet  3   No current facility-administered medications on file prior to visit.    Allergies  Allergen Reactions  . Eggs Or Egg-Derived Products   . Sulfa Antibiotics Nausea And Vomiting  . Penicillins Rash    Review of Systems  Review of Systems  Constitutional: Negative for fever and malaise/fatigue.  HENT: Positive for neck pain. Negative for congestion.   Eyes: Negative for discharge.  Respiratory: Negative for shortness of breath.   Cardiovascular: Negative for chest pain, palpitations and leg swelling.  Gastrointestinal: Negative for nausea, abdominal pain and diarrhea.  Genitourinary: Negative for dysuria.  Musculoskeletal: Positive for myalgias, back pain and joint pain. Negative for falls.  Skin:  Negative for rash.  Neurological: Positive for dizziness. Negative for loss of consciousness and headaches.  Endo/Heme/Allergies: Negative for polydipsia.  Psychiatric/Behavioral: Negative for depression and suicidal ideas. The patient is not nervous/anxious and does not have insomnia.     Objective  BP 122/82  Pulse 76  Temp(Src) 98 F (36.7 C) (Oral)  Ht 5' 1.25" (1.556 m)  Wt 208 lb (94.348 kg)  BMI 38.97 kg/m2  SpO2 97%  Physical Exam  Physical Exam  Constitutional: She is oriented to person, place, and time and well-developed, well-nourished, and in no distress. No distress.  HENT:  Head: Normocephalic and atraumatic.  Eyes: Conjunctivae are normal.  Neck: Neck supple. No thyromegaly present.  Cardiovascular: Normal rate, regular rhythm and normal heart sounds.   Pulmonary/Chest: Effort normal and breath sounds normal. She has no wheezes.  Abdominal: She exhibits no distension and no mass.  Musculoskeletal: She exhibits no edema.  Lymphadenopathy:    She has no cervical adenopathy.  Neurological: She is alert and oriented to person, place, and time.  Skin: Skin is warm and dry. No rash noted. She is not diaphoretic.  Psychiatric: Memory, affect and judgment normal.    Lab Results  Component Value Date   TSH 0.328* 02/16/2013   Lab Results  Component Value Date   WBC 7.0 02/16/2013   HGB 13.9 02/16/2013   HCT 40.7 02/16/2013   MCV 86.0 02/16/2013   PLT 312 02/16/2013   Lab Results  Component Value Date   CREATININE 0.75 02/16/2013   BUN 15 02/16/2013   NA 139 02/16/2013   K 4.3 02/16/2013   CL 102 02/16/2013   CO2 30 02/16/2013   Lab Results  Component Value Date   ALT 13 02/16/2013   AST 13 02/16/2013   ALKPHOS 63 02/16/2013   BILITOT 0.4 02/16/2013   Lab Results  Component Value Date   CHOL 145 02/16/2013   Lab Results  Component Value Date   HDL 46 02/16/2013   Lab Results  Component Value Date   LDLCALC 76 02/16/2013   Lab Results  Component Value  Date   TRIG 117 02/16/2013   Lab Results  Component Value Date   CHOLHDL 3.2 02/16/2013     Assessment & Plan  Hypertension Well controlled, no changes  UTI (urinary tract infection) Urine negative today,  BPV (benign positional vertigo) Infrequent, responds to Meclizine. Encouraged adequate hydration, report worsening symptoms  Arthritis Has been responding to PT and massage. Continue the same.  Obesity Encouraged DASH diet and exercise as tolerated.

## 2013-02-19 NOTE — Assessment & Plan Note (Signed)
Encouraged DASH diet and exercise as tolerated.

## 2013-02-19 NOTE — Assessment & Plan Note (Signed)
Well controlled, no changes 

## 2013-02-19 NOTE — Assessment & Plan Note (Signed)
Has been responding to PT and massage. Continue the same.

## 2013-02-19 NOTE — Assessment & Plan Note (Signed)
Urine negative today,

## 2013-02-19 NOTE — Assessment & Plan Note (Signed)
Infrequent, responds to Meclizine. Encouraged adequate hydration, report worsening symptoms

## 2013-02-20 ENCOUNTER — Telehealth: Payer: Self-pay

## 2013-02-20 NOTE — Telephone Encounter (Signed)
So explain to her that a low TSH actually can mean over treated thyroid (TSH comes from [facility] and tells the thyroid what to do.). The Thyroid hormone is actually normal (T4). The hctz part of her bp med is the part that helps the swelling. We can switch her to Lisinoprilhct 10/12.5 1 daily so her Lisinopril is in 1/2 but her hctz is not

## 2013-02-20 NOTE — Telephone Encounter (Signed)
Patient left a message stating that she was told to cut her Lisinopril to 1/2 tablet daily and her feet and ankles are really swollen.  Also, pt noticed on her labs that her TSH was high and she was told awhile ago to only take the Synthroid 6 days a week instead of 7? Pt is wandering if she should still do this or take the Levothyroxine 7 days a week?   Please advise both questions?

## 2013-02-21 NOTE — Telephone Encounter (Signed)
I tried to call pt but someone picked up and then hung up the phone. I tried cell number and it states "hi this is [name]".  I will try again later

## 2013-02-22 ENCOUNTER — Encounter: Payer: Self-pay | Admitting: Family Medicine

## 2013-02-22 MED ORDER — LISINOPRIL-HYDROCHLOROTHIAZIDE 10-12.5 MG PO TABS: 1.0000 | ORAL_TABLET | Freq: Every day | ORAL | Status: DC

## 2013-02-22 NOTE — Telephone Encounter (Signed)
Look at other note

## 2013-02-22 NOTE — Telephone Encounter (Signed)
Dr Abner Greenspan can you answer the question of how many days pt needs to take the Synthroid? She is taking it 6 days now?  Also, pt asked if you know a podiatrist you would recommend?  I informed pt that you are out of the office until Monday and we will be in contact with her then  Please advise?

## 2013-02-24 ENCOUNTER — Encounter: Payer: Self-pay | Admitting: Family Medicine

## 2013-02-24 NOTE — Telephone Encounter (Signed)
We called in the new

## 2013-02-24 NOTE — Telephone Encounter (Signed)
Per md:  So she should continue her Synthroid 6 days a week for now since she is asymptomatic and decreasing it further could cause fatigue etc. Podiatry depends on location, use [facility] Dr Ralene Cork a lot with good results. If she does not want to go to [place] then their is the podiatrist just up the road. I will spell it wrongbut Felicia Lowe knows her name. She can increase her Lisinopril hct back up to full pill or we can call in the 10/12.5   We called in the new medication. I will send her this other information in mychart

## 2013-02-27 ENCOUNTER — Encounter: Payer: Self-pay | Admitting: Physician Assistant

## 2013-02-27 ENCOUNTER — Ambulatory Visit (INDEPENDENT_AMBULATORY_CARE_PROVIDER_SITE_OTHER): Payer: Medicare HMO | Admitting: Physician Assistant

## 2013-02-27 VITALS — BP 116/74 | HR 86 | Temp 97.9°F | Resp 16 | Ht 61.25 in | Wt 206.2 lb

## 2013-02-27 DIAGNOSIS — L0291 Cutaneous abscess, unspecified: Secondary | ICD-10-CM

## 2013-02-27 DIAGNOSIS — L0390 Cellulitis, unspecified: Secondary | ICD-10-CM | POA: Insufficient documentation

## 2013-02-27 DIAGNOSIS — L03115 Cellulitis of right lower limb: Secondary | ICD-10-CM | POA: Insufficient documentation

## 2013-02-27 MED ORDER — DOXYCYCLINE HYCLATE 100 MG PO TABS: 100.0000 mg | ORAL_TABLET | Freq: Two times a day (BID) | ORAL | Status: DC

## 2013-02-27 NOTE — Patient Instructions (Signed)
Please take antibiotic as prescribed until all tablets are gone.  Continue daily probiotic.  I hope you feel better.  Please call or return to clinic if symptoms persist or acutely worsen.   Cellulitis Cellulitis is an infection of the skin and the tissue beneath it. The infected area is usually red and tender. Cellulitis occurs most often in the arms and lower legs.  CAUSES  Cellulitis is caused by bacteria that enter the skin through cracks or cuts in the skin. The most common types of bacteria that cause cellulitis are Staphylococcus and Streptococcus. SYMPTOMS   Redness and warmth.  Swelling.  Tenderness or pain.  Fever. DIAGNOSIS  Your caregiver can usually determine what is wrong based on a physical exam. Blood tests may also be done. TREATMENT  Treatment usually involves taking an antibiotic medicine. HOME CARE INSTRUCTIONS   Take your antibiotics as directed. Finish them even if you start to feel better.  Keep the infected arm or leg elevated to reduce swelling.  Apply a warm cloth to the affected area up to 4 times per day to relieve pain.  Only take over-the-counter or prescription medicines for pain, discomfort, or fever as directed by your caregiver.  Keep all follow-up appointments as directed by your caregiver. SEEK MEDICAL CARE IF:   You notice red streaks coming from the infected area.  Your red area gets larger or turns dark in color.  Your bone or joint underneath the infected area becomes painful after the skin has healed.  Your infection returns in the same area or another area.  You notice a swollen bump in the infected area.  You develop new symptoms. SEEK IMMEDIATE MEDICAL CARE IF:   You have a fever.  You feel very sleepy.  You develop vomiting or diarrhea.  You have a general ill feeling (malaise) with muscle aches and pains. MAKE SURE YOU:   Understand these instructions.  Will watch your condition.  Will get help right away if you  are not doing well or get worse. Document Released: 04/01/2005 Document Revised: 12/22/2011 Document Reviewed: 09/07/2011 [facility] Patient Information 2014 [place], [place].

## 2013-02-27 NOTE — Assessment & Plan Note (Addendum)
Rx Doxycycline.  Daily Probiotic.  Continue cold compresses.  Watch for increase in redness, fever, chills, sweats.

## 2013-02-27 NOTE — Progress Notes (Signed)
Patient ID: Felicia Lowe, female   DOB: 1941/02/11, 72 y.o.   MRN: [id number]   Patient presents to clinic today c/o redness and swelling of right foot x 5 days.  Information was obtained from the patient.  Patient states that she noticed her foot was itchy and was reddened and swollen.  Is not sure of insect bite.  Does not recall feeling a bite or pulling tick off of her foot.  Patient states the foot has remained swollen and has become quite tender.  Has been applying ice for swelling and pain with relief of symptoms.  Patient denies draiage or bite marks.  Denies trauma.  Denies fever, chills, sweats, malaise/faitgue, N/V.  Has not noticed similar symptoms elsewhere.  Past Medical History  Diagnosis Date  . Thyroid disease   . Hypertension   . Vitamin D deficiency   . Chicken pox as a child  . Measles as a child  . Mumps as a child  . Osteoporosis   . Obesity   . Kidney stone 2000    nephritis as a child  . Allergy   . H/O breast biopsy     benign, on left  . Arthritis     osteoarthritis, severe, b/l knees &b/l hips  replaced  . Skin lesion of face 05/18/2012  . UTI (urinary tract infection) 12/11/2012  . BPV (benign positional vertigo) 02/19/2013    mild    Current Outpatient Prescriptions on File Prior to Visit  Medication Sig Dispense Refill  . Ascorbic Acid (VITAMIN C) 1000 MG tablet Take 1,000 mg by mouth daily.      Marland Kitchen aspirin 81 MG tablet Take 81 mg by mouth daily.      . Cholecalciferol (VITAMIN D) 2000 UNITS tablet Take 2,000 Units by mouth daily.      . Coenzyme Q10 (CO Q 10) 100 MG CAPS Take 400 mg by mouth daily.      . furosemide (LASIX) 20 MG tablet Take 1 tablet (20 mg total) by mouth daily as needed.  30 tablet  3  . lisinopril-hydrochlorothiazide (PRINZIDE,ZESTORETIC) 20-12.5 MG per tablet 1/2 tab po daily  90 tablet  1  . Omega-3 Fatty Acids (FISH OIL) 1000 MG CPDR Take 1 capsule by mouth daily.      Marland Kitchen SYNTHROID 150 MCG tablet Take 1 tablet (150 mcg total) by  mouth daily before breakfast.  30 tablet  3  . cyclobenzaprine (FLEXERIL) 10 MG tablet Take 1 tablet (10 mg total) by mouth at bedtime as needed for muscle spasms.  30 tablet  1   No current facility-administered medications on file prior to visit.    Allergies  Allergen Reactions  . Eggs Or Egg-Derived Products   . Sulfa Antibiotics Nausea And Vomiting  . Penicillins Rash    Family History  Problem Relation Age of Onset  . Hyperlipidemia Mother   . Hypertension Mother   . Heart attack Mother   . Cancer Father     lung- smoker  . Heart attack Father     X 2  . Thyroid disease Brother   . Hypertension Son   . Cancer Paternal Grandmother     History   Social History  . Marital Status: Unknown    Spouse Name: N/A    Number of Children: N/A  . Years of Education: N/A   Social History Main Topics  . Smoking status: Never Smoker   . Smokeless tobacco: Never Used  . Alcohol Use: Yes  Comment: Occasional glass of wine  . Drug Use: No  . Sexual Activity: None   Other Topics Concern  . None   Social History Narrative  . None    Review of Systems  Constitutional: Negative for fever, chills, weight loss and malaise/fatigue.  Respiratory: Negative for cough, shortness of breath and wheezing.   Gastrointestinal: Negative for nausea, vomiting, abdominal pain, diarrhea and constipation.  Musculoskeletal: Negative for myalgias.  Skin: Positive for itching. Negative for rash.  Neurological: Negative for dizziness, tingling, sensory change and headaches.   Filed Vitals:   02/27/13 1022  BP: 116/74  Pulse: 86  Temp: 97.9 F (36.6 C)  Resp: 16   Physical Exam  Constitutional: She is oriented to person, place, and time and well-developed, well-nourished, and in no distress.  HENT:  Head: Normocephalic and atraumatic.  Eyes: Pupils are equal, round, and reactive to light.  Neck: Neck supple.  Cardiovascular: Normal rate, regular rhythm, normal heart sounds and  intact distal pulses.   Pulmonary/Chest: Effort normal and breath sounds normal.  Lymphadenopathy:    She has no cervical adenopathy.  Neurological: She is alert and oriented to person, place, and time.  Skin: Skin is warm and dry.  Erythema and tenderness of right forefoot without excoriation, drainage or abscess.   Recent Results (from the past 2160 hour(s))  URINALYSIS     Status: Abnormal   Collection Time    12/07/12 12:05 PM      Result Value Range   Color, Urine YELLOW  YELLOW   APPearance CLEAR  CLEAR   Specific Gravity, Urine 1.015  1.005 - 1.030   pH 6.0  5.0 - 8.0   Glucose, UA NEG  NEG mg/dL   Bilirubin Urine NEG  NEG   Ketones, ur NEG  NEG mg/dL   Hgb urine dipstick NEG  NEG   Protein, ur NEG  NEG mg/dL   Urobilinogen, UA 0.2  0.0 - 1.0 mg/dL   Nitrite NEG  NEG   Leukocytes, UA MOD (*) NEG  URINE CULTURE     Status: None   Collection Time    12/07/12 12:05 PM      Result Value Range   Colony Count 40,000 COLONIES/ML     Organism ID, Bacteria Multiple bacterial morphotypes present, none     Organism ID, Bacteria predominant. Suggest appropriate recollection if      Organism ID, Bacteria clinically indicated.    POCT URINALYSIS DIPSTICK     Status: None   Collection Time    01/30/13  9:49 AM      Result Value Range   Color, UA yellow     Clarity, UA cloudy     Glucose, UA negative     Bilirubin, UA negative     Ketones, UA negative     Spec Grav, UA 1.010     Blood, UA trace     pH, UA 6.5     Protein, UA negative     Urobilinogen, UA 0.2     Nitrite, UA negative     Leukocytes, UA small (1+)    URINE CULTURE     Status: None   Collection Time    01/30/13  9:51 AM      Result Value Range   Colony Count 9,000 COLONIES/ML     Organism ID, Bacteria Insignificant Growth    URINE CULTURE     Status: None   Collection Time    02/16/13 11:51 AM  Result Value Range   Colony Count NO GROWTH     Organism ID, Bacteria NO GROWTH    URINALYSIS      Status: Abnormal   Collection Time    02/16/13 12:11 PM      Result Value Range   Color, Urine YELLOW  YELLOW   APPearance CLEAR  CLEAR   Specific Gravity, Urine 1.011  1.005 - 1.030   pH 6.0  5.0 - 8.0   Glucose, UA NEG  NEG mg/dL   Bilirubin Urine NEG  NEG   Ketones, ur NEG  NEG mg/dL   Hgb urine dipstick NEG  NEG   Protein, ur NEG  NEG mg/dL   Urobilinogen, UA 0.2  0.0 - 1.0 mg/dL   Nitrite NEG  NEG   Leukocytes, UA MOD (*) NEG  CBC     Status: None   Collection Time    02/16/13 12:11 PM      Result Value Range   WBC 7.0  4.0 - 10.5 K/uL   RBC 4.73  3.87 - 5.11 MIL/uL   Hemoglobin 13.9  12.0 - 15.0 g/dL   HCT 29.5  28.4 - 13.2 %   MCV 86.0  78.0 - 100.0 fL   MCH 29.4  26.0 - 34.0 pg   MCHC 34.2  30.0 - 36.0 g/dL   RDW 44.0  10.2 - 72.5 %   Platelets 312  150 - 400 K/uL  RENAL FUNCTION PANEL     Status: None   Collection Time    02/16/13 12:11 PM      Result Value Range   Sodium 139  135 - 145 mEq/L   Potassium 4.3  3.5 - 5.3 mEq/L   Chloride 102  96 - 112 mEq/L   CO2 30  19 - 32 mEq/L   Glucose, Bld 83  70 - 99 mg/dL   BUN 15  6 - 23 mg/dL   Creat 3.66  4.40 - 3.47 mg/dL   Albumin 4.3  3.5 - 5.2 g/dL   Calcium 9.9  8.4 - 42.5 mg/dL   Phosphorus 3.5  2.3 - 4.6 mg/dL  HEPATIC FUNCTION PANEL     Status: None   Collection Time    02/16/13 12:11 PM      Result Value Range   Total Bilirubin 0.4  0.3 - 1.2 mg/dL   Bilirubin, Direct 0.1  0.0 - 0.3 mg/dL   Indirect Bilirubin 0.3  0.0 - 0.9 mg/dL   Alkaline Phosphatase 63  39 - 117 U/L   AST 13  0 - 37 U/L   ALT 13  0 - 35 U/L   Total Protein 6.7  6.0 - 8.3 g/dL   Albumin 4.3  3.5 - 5.2 g/dL  TSH     Status: Abnormal   Collection Time    02/16/13 12:11 PM      Result Value Range   TSH 0.328 (*) 0.350 - 4.500 uIU/mL  T4, FREE     Status: None   Collection Time    02/16/13 12:11 PM      Result Value Range   Free T4 1.45  0.80 - 1.80 ng/dL  LIPID PANEL     Status: None   Collection Time    02/16/13 12:11 PM       Result Value Range   Cholesterol 145  0 - 200 mg/dL   Comment: ATP III Classification:           < 200  mg/dL        Desirable          200 - 239     mg/dL        Borderline High          >= 240        mg/dL        High         Triglycerides 117  <150 mg/dL   HDL 46  >16 mg/dL   Total CHOL/HDL Ratio 3.2     VLDL 23  0 - 40 mg/dL   LDL Cholesterol 76  0 - 99 mg/dL   Comment:       Total Cholesterol/HDL Ratio:CHD Risk                            Coronary Heart Disease Risk Table                                            Men       Women              1/2 Average Risk              3.4        3.3                  Average Risk              5.0        4.4               2X Average Risk              9.6        7.1               3X Average Risk             23.4       11.0     Use the calculated Patient Ratio above and the CHD Risk table      to determine the patient's CHD Risk.     ATP III Classification (LDL):           < 100        mg/dL         Optimal          100 - 129     mg/dL         Near or Above Optimal          130 - 159     mg/dL         Borderline High          160 - 189     mg/dL         High           > 190        mg/dL         Very High        VITAMIN D 25 HYDROXY     Status: None   Collection Time    02/16/13 12:11 PM      Result Value Range   Vit D, 25-Hydroxy 40  30 - 89 ng/mL   Comment: This assay accurately quantifies Vitamin D, which is the sum of the  25-Hydroxy forms of Vitamin D2 and D3.  Studies have shown that the     optimum concentration of 25-Hydroxy Vitamin D is 30 ng/mL or higher.      Concentrations of Vitamin D between 20 and 29 ng/mL are considered to     be insufficient and concentrations less than 20 ng/mL are considered     to be deficient for Vitamin D.    Assessment/Plan: Cellulitis Rx Doxycycline.  Daily Probiotic.  Continue cold compresses.  Watch for increase in redness, fever, chills, sweats.

## 2013-03-08 ENCOUNTER — Ambulatory Visit: Payer: Medicare HMO | Admitting: Family Medicine

## 2013-03-08 ENCOUNTER — Telehealth: Payer: Self-pay | Admitting: Physician Assistant

## 2013-03-08 ENCOUNTER — Encounter: Payer: Self-pay | Admitting: Family Medicine

## 2013-03-08 NOTE — Telephone Encounter (Signed)
Please advise? Pt has an appt on 03-27-13 with MD

## 2013-03-08 NOTE — Telephone Encounter (Signed)
Felicia Lowe, Dr Abner Greenspan stated pt could do Keflex if no Penicillin allergy but pt does have an allergy to Penicillin.  Can anything else be sent or wait for Dr Abner Greenspan in the morning?

## 2013-03-08 NOTE — Telephone Encounter (Signed)
Opened in error

## 2013-03-08 NOTE — Telephone Encounter (Signed)
The only other option I am aware of for cellulitis in penicillin-allergic patient, who has a recurrence after doxycycline is clindamycin which must be taken 4 times/day.  If she is not having any pain, we can speak with Dr. Abner Greenspan in the am to get her input of additional antibiotic therapies.

## 2013-03-08 NOTE — Telephone Encounter (Signed)
Pt has penicillin allergy.

## 2013-03-09 MED ORDER — CEPHALEXIN 500 MG PO CAPS: 500.0000 mg | ORAL_CAPSULE | Freq: Three times a day (TID) | ORAL | Status: DC

## 2013-03-09 NOTE — Addendum Note (Signed)
Addended by: Court Joy on: 03/09/2013 12:21 PM   Modules accepted: Orders

## 2013-03-14 ENCOUNTER — Encounter: Payer: Self-pay | Admitting: Family Medicine

## 2013-03-14 NOTE — Telephone Encounter (Signed)
Please advise 

## 2013-03-16 ENCOUNTER — Encounter: Payer: Self-pay | Admitting: Family Medicine

## 2013-03-17 ENCOUNTER — Ambulatory Visit (INDEPENDENT_AMBULATORY_CARE_PROVIDER_SITE_OTHER): Payer: Medicare HMO | Admitting: Family

## 2013-03-17 ENCOUNTER — Encounter: Payer: Self-pay | Admitting: Family

## 2013-03-17 DIAGNOSIS — L304 Erythema intertrigo: Secondary | ICD-10-CM | POA: Insufficient documentation

## 2013-03-17 DIAGNOSIS — I10 Essential (primary) hypertension: Secondary | ICD-10-CM

## 2013-03-17 DIAGNOSIS — L270 Generalized skin eruption due to drugs and medicaments taken internally: Secondary | ICD-10-CM | POA: Insufficient documentation

## 2013-03-17 DIAGNOSIS — L538 Other specified erythematous conditions: Secondary | ICD-10-CM

## 2013-03-17 MED ORDER — NYSTATIN 100000 UNIT/GM EX POWD: CUTANEOUS | Status: DC

## 2013-03-17 MED ORDER — BETAMETHASONE DIPROPIONATE 0.05 % EX CREA: TOPICAL_CREAM | Freq: Two times a day (BID) | CUTANEOUS | Status: DC

## 2013-03-17 NOTE — Assessment & Plan Note (Signed)
Will add diprolene cream to affected areas on arm and trunk prn itching, continue zyrtec. Keflex has been added to her drug allergy list.

## 2013-03-17 NOTE — Telephone Encounter (Signed)
Patient came in this afternoon and saw Felicia Lowe.

## 2013-03-17 NOTE — Patient Instructions (Addendum)
Please call if symptoms worsen or if not improved in 1 week.  

## 2013-03-17 NOTE — Progress Notes (Signed)
Subjective:    Patient ID: Felicia Lowe, female    DOB: 03-07-1941, 72 y.o.   MRN: [id number]  HPI  Felicia Lowe is a 72 yr old female who presents today for follow up of rash. She was recently treated for cellulitis of the right foot with doxycycline followed by Keflex.  She was able to take 5 days of keflex but had to stop due to a pruritic rash. Her last dose of keflex was Monday night.  She reports that she started zyrtec for the itching and has noted slight improvement in her symptoms.    She also reports a red rash in her right groin.   Review of Systems    see HPI  Past Medical History  Diagnosis Date  . Thyroid disease   . Hypertension   . Vitamin D deficiency   . Chicken pox as a child  . Measles as a child  . Mumps as a child  . Osteoporosis   . Obesity   . Kidney stone 2000    nephritis as a child  . Allergy   . H/O breast biopsy     benign, on left  . Arthritis     osteoarthritis, severe, b/l knees &b/l hips  replaced  . Skin lesion of face 05/18/2012  . UTI (urinary tract infection) 12/11/2012  . BPV (benign positional vertigo) 02/19/2013    mild    History   Social History  . Marital Status: Unknown    Spouse Name: N/A    Number of Children: N/A  . Years of Education: N/A   Occupational History  . Not on file.   Social History Main Topics  . Smoking status: Never Smoker   . Smokeless tobacco: Never Used  . Alcohol Use: Yes     Comment: Occasional glass of wine  . Drug Use: No  . Sexual Activity: Not on file   Other Topics Concern  . Not on file   Social History Narrative  . No narrative on file    Past Surgical History  Procedure Laterality Date  . Knee surgery      both knees replaced  . Hip surgery      both hips    Family History  Problem Relation Age of Onset  . Hyperlipidemia Mother   . Hypertension Mother   . Heart attack Mother   . Cancer Father     lung- smoker  . Heart attack Father     X 2  . Thyroid disease Brother    . Hypertension Son   . Cancer Paternal Grandmother     Allergies  Allergen Reactions  . Eggs Or Egg-Derived Products   . Sulfa Antibiotics Nausea And Vomiting  . Penicillins Rash    Current Outpatient Prescriptions on File Prior to Visit  Medication Sig Dispense Refill  . Ascorbic Acid (VITAMIN C) 1000 MG tablet Take 1,000 mg by mouth daily.      Marland Kitchen aspirin 81 MG tablet Take 81 mg by mouth daily.      . Cholecalciferol (VITAMIN D) 2000 UNITS tablet Take 2,000 Units by mouth daily.      . Coenzyme Q10 (CO Q 10) 100 MG CAPS Take 400 mg by mouth daily.      . cyclobenzaprine (FLEXERIL) 10 MG tablet Take 1 tablet (10 mg total) by mouth at bedtime as needed for muscle spasms.  30 tablet  1  . doxycycline (VIBRA-TABS) 100 MG tablet Take 1 tablet (100 mg total) by  mouth 2 (two) times daily.  14 tablet  0  . furosemide (LASIX) 20 MG tablet Take 1 tablet (20 mg total) by mouth daily as needed.  30 tablet  3  . Omega-3 Fatty Acids (FISH OIL) 1000 MG CPDR Take 1 capsule by mouth daily.      Marland Kitchen SYNTHROID 150 MCG tablet Take 1 tablet (150 mcg total) by mouth daily before breakfast.  30 tablet  3   No current facility-administered medications on file prior to visit.    BP 110/80  Pulse 72  Temp(Src) 97.8 F (36.6 C) (Oral)  Resp 16  Ht 5' 1.25" (1.556 m)  Wt 205 lb 1.3 oz (93.024 kg)  BMI 38.42 kg/m2  SpO2 96%     Objective:   Physical Exam  Constitutional: She appears well-developed and well-nourished. No distress.  Cardiovascular: Normal rate and regular rhythm.   No murmur heard. Pulmonary/Chest: Effort normal and breath sounds normal. No respiratory distress. She has no wheezes. She has no rales. She exhibits no tenderness.  Musculoskeletal:  2+ bilateral LE edema  Skin:  Right foot- no cellulitis noted  Erythematous rash noted right inguinal area  Patchy red raised rash noted right forearm, trunk.            Assessment & Plan:

## 2013-03-17 NOTE — Assessment & Plan Note (Signed)
Trial of nystatin powder to groin.

## 2013-03-27 ENCOUNTER — Ambulatory Visit: Payer: Medicare HMO | Admitting: Family Medicine

## 2013-04-22 ENCOUNTER — Other Ambulatory Visit: Payer: Self-pay | Admitting: Family Medicine

## 2013-05-11 ENCOUNTER — Other Ambulatory Visit: Payer: Self-pay

## 2013-05-18 ENCOUNTER — Ambulatory Visit (INDEPENDENT_AMBULATORY_CARE_PROVIDER_SITE_OTHER): Payer: Medicare HMO | Admitting: Family Medicine

## 2013-05-18 VITALS — BP 118/84 | HR 83 | Temp 98.5°F | Resp 16 | Ht 61.25 in | Wt 204.2 lb

## 2013-05-18 DIAGNOSIS — M791 Myalgia, unspecified site: Secondary | ICD-10-CM

## 2013-05-18 DIAGNOSIS — I10 Essential (primary) hypertension: Secondary | ICD-10-CM

## 2013-05-18 DIAGNOSIS — E079 Disorder of thyroid, unspecified: Secondary | ICD-10-CM

## 2013-05-18 DIAGNOSIS — IMO0001 Reserved for inherently not codable concepts without codable children: Secondary | ICD-10-CM

## 2013-05-18 DIAGNOSIS — B9789 Other viral agents as the cause of diseases classified elsewhere: Secondary | ICD-10-CM

## 2013-05-18 DIAGNOSIS — R6883 Chills (without fever): Secondary | ICD-10-CM

## 2013-05-18 DIAGNOSIS — R5381 Other malaise: Secondary | ICD-10-CM

## 2013-05-18 LAB — RENAL FUNCTION PANEL
Albumin: 4.1 g/dL (ref 3.5–5.2)
BUN: 14 mg/dL (ref 6–23)
CO2: 27 mEq/L (ref 19–32)
Chloride: 102 mEq/L (ref 96–112)
Creat: 0.68 mg/dL (ref 0.50–1.10)
Phosphorus: 3.5 mg/dL (ref 2.3–4.6)

## 2013-05-18 LAB — HEPATIC FUNCTION PANEL
ALT: 18 U/L (ref 0–35)
Albumin: 4.1 g/dL (ref 3.5–5.2)
Bilirubin, Direct: 0.1 mg/dL (ref 0.0–0.3)
Indirect Bilirubin: 0.2 mg/dL (ref 0.0–0.9)
Total Bilirubin: 0.3 mg/dL (ref 0.3–1.2)

## 2013-05-18 LAB — CBC
HCT: 42.6 % (ref 36.0–46.0)
Hemoglobin: 14.7 g/dL (ref 12.0–15.0)
MCH: 30.2 pg (ref 26.0–34.0)
MCHC: 34.5 g/dL (ref 30.0–36.0)
RDW: 14.4 % (ref 11.5–15.5)

## 2013-05-18 NOTE — Patient Instructions (Signed)
Can consider a zinc supplement such as a calcium/magnesium/zinc tab daily, or Coldeeze   Viral Infections A viral infection can be caused by different types of viruses.Most viral infections are not serious and resolve on their own. However, some infections may cause severe symptoms and may lead to further complications. SYMPTOMS Viruses can frequently cause:  Minor sore throat.  Aches and pains.  Headaches.  Runny nose.  Different types of rashes.  Watery eyes.  Tiredness.  Cough.  Loss of appetite.  Gastrointestinal infections, resulting in nausea, vomiting, and diarrhea. These symptoms do not respond to antibiotics because the infection is not caused by bacteria. However, you might catch a bacterial infection following the viral infection. This is sometimes called a "superinfection." Symptoms of such a bacterial infection may include:  Worsening sore throat with pus and difficulty swallowing.  Swollen neck glands.  Chills and a high or persistent fever.  Severe headache.  Tenderness over the sinuses.  Persistent overall ill feeling (malaise), muscle aches, and tiredness (fatigue).  Persistent cough.  Yellow, green, or brown mucus production with coughing. HOME CARE INSTRUCTIONS   Only take over-the-counter or prescription medicines for pain, discomfort, diarrhea, or fever as directed by your caregiver.  Drink enough water and fluids to keep your urine clear or pale yellow. Sports drinks can provide valuable electrolytes, sugars, and hydration.  Get plenty of rest and maintain proper nutrition. Soups and broths with crackers or rice are fine. SEEK IMMEDIATE MEDICAL CARE IF:   You have severe headaches, shortness of breath, chest pain, neck pain, or an unusual rash.  You have uncontrolled vomiting, diarrhea, or you are unable to keep down fluids.  You or your child has an oral temperature above 102 F (38.9 C), not controlled by medicine.  Your baby is  older than 3 months with a rectal temperature of 102 F (38.9 C) or higher.  Your baby is 95 months old or younger with a rectal temperature of 100.4 F (38 C) or higher. MAKE SURE YOU:   Understand these instructions.  Will watch your condition.  Will get help right away if you are not doing well or get worse. Document Released: 04/01/2005 Document Revised: 09/14/2011 Document Reviewed: 10/27/2010 [facility] Patient Information 2014 [place], [place].

## 2013-05-18 NOTE — Progress Notes (Signed)
Pre visit review using our clinic review tool, if applicable. No additional management support is needed unless otherwise documented below in the visit note/SLS  

## 2013-05-19 LAB — URINALYSIS
Bilirubin Urine: NEGATIVE
Glucose, UA: NEGATIVE mg/dL
Hgb urine dipstick: NEGATIVE
Protein, ur: NEGATIVE mg/dL
Urobilinogen, UA: 0.2 mg/dL (ref 0.0–1.0)

## 2013-05-19 LAB — URINE CULTURE: Organism ID, Bacteria: NO GROWTH

## 2013-05-21 ENCOUNTER — Encounter: Payer: Self-pay | Admitting: Family Medicine

## 2013-05-21 DIAGNOSIS — B9789 Other viral agents as the cause of diseases classified elsewhere: Secondary | ICD-10-CM | POA: Insufficient documentation

## 2013-05-21 HISTORY — DX: Other viral agents as the cause of diseases classified elsewhere: B97.89

## 2013-05-21 NOTE — Progress Notes (Signed)
Patient ID: Shamekia Tippets, female   DOB: [birth year], 72 y.o.   MRN: [id number] [place] [id number] 1941-06-06 05/21/2013      Progress Note-Follow Up  Subjective  Chief Complaint  Chief Complaint  Patient presents with  . Fatigue    Pt c/o excessive fatigue, body aches, & hair loss [TSH: 08.1414/0.328 L]    HPI  Patient is a 72 year old Caucasian female who is in today complaining of several days worth of excessive fatigue and myalgias. She's had some mild anorexia chills and a sore throat as well. Denies any significant cough but does have some generalized congestion. No vomiting diarrhea or constipation. No chest pain, palpitations or shortness of breath.  Past Medical History  Diagnosis Date  . Thyroid disease   . Hypertension   . Vitamin D deficiency   . Chicken pox as a child  . Measles as a child  . Mumps as a child  . Osteoporosis   . Obesity   . Kidney stone 2000    nephritis as a child  . Allergy   . H/O breast biopsy     benign, on left  . Arthritis     osteoarthritis, severe, b/l knees &b/l hips  replaced  . Skin lesion of face 05/18/2012  . UTI (urinary tract infection) 12/11/2012  . BPV (benign positional vertigo) 02/19/2013    mild  . Viral respiratory illness 05/21/2013    Past Surgical History  Procedure Laterality Date  . Knee surgery      both knees replaced  . Hip surgery      both hips    Family History  Problem Relation Age of Onset  . Hyperlipidemia Mother   . Hypertension Mother   . Heart attack Mother   . Cancer Father     lung- smoker  . Heart attack Father     X 2  . Thyroid disease Brother   . Hypertension Son   . Cancer Paternal Grandmother     History   Social History  . Marital Status: Unknown    Spouse Name: N/A    Number of Children: N/A  . Years of Education: N/A   Occupational History  . Not on file.   Social History Main Topics  . Smoking status: Never Smoker   . Smokeless tobacco: Never Used  . Alcohol  Use: Yes     Comment: Occasional glass of wine  . Drug Use: No  . Sexual Activity: Not on file   Other Topics Concern  . Not on file   Social History Narrative  . No narrative on file    Current Outpatient Prescriptions on File Prior to Visit  Medication Sig Dispense Refill  . Ascorbic Acid (VITAMIN C) 1000 MG tablet Take 1,000 mg by mouth daily.      Marland Kitchen aspirin 81 MG tablet Take 81 mg by mouth daily.      . Cholecalciferol (VITAMIN D) 2000 UNITS tablet Take 2,000 Units by mouth daily.      . Coenzyme Q10 (CO Q 10) 100 MG CAPS Take 400 mg by mouth daily.      . furosemide (LASIX) 20 MG tablet Take 1 tablet (20 mg total) by mouth daily as needed.  30 tablet  3  . lisinopril-hydrochlorothiazide (PRINZIDE,ZESTORETIC) 10-12.5 MG per tablet Take 1 tablet by mouth daily.      . Omega-3 Fatty Acids (FISH OIL) 1000 MG CPDR Take 1 capsule by mouth daily.      Marland Kitchen SYNTHROID  150 MCG tablet TAKE 1 TABLET (150 MCG TOTAL) BY MOUTH DAILY BEFORE BREAKFAST.  30 tablet  3   No current facility-administered medications on file prior to visit.    Allergies  Allergen Reactions  . Eggs Or Egg-Derived Products   . Keflex [Cephalexin]     Rash   . Sulfa Antibiotics Nausea And Vomiting  . Penicillins Rash    Review of Systems  Review of Systems  Constitutional: Positive for chills and malaise/fatigue. Negative for fever.  HENT: Positive for congestion.   Eyes: Negative for discharge.  Respiratory: Negative for shortness of breath.   Cardiovascular: Negative for chest pain, palpitations and leg swelling.  Gastrointestinal: Negative for nausea, abdominal pain and diarrhea.  Genitourinary: Negative for dysuria.  Musculoskeletal: Positive for myalgias. Negative for falls.  Skin: Negative for rash.  Neurological: Positive for headaches. Negative for loss of consciousness.  Endo/Heme/Allergies: Negative for polydipsia.  Psychiatric/Behavioral: Negative for depression and suicidal ideas. The patient  is not nervous/anxious and does not have insomnia.     Objective  BP 118/84  Pulse 83  Temp(Src) 98.5 F (36.9 C) (Oral)  Resp 16  Ht 5' 1.25" (1.556 m)  Wt 204 lb 4 oz (92.647 kg)  BMI 38.27 kg/m2  SpO2 97%  Physical Exam  Physical Exam  Constitutional: She is oriented to person, place, and time and well-developed, well-nourished, and in no distress. No distress.  HENT:  Head: Normocephalic and atraumatic.  Eyes: Conjunctivae are normal.  Neck: Neck supple. No thyromegaly present.  Cardiovascular: Normal rate, regular rhythm and normal heart sounds.   No murmur heard. Pulmonary/Chest: Effort normal and breath sounds normal. She has no wheezes.  Abdominal: She exhibits no distension and no mass.  Musculoskeletal: She exhibits no edema.  Lymphadenopathy:    She has no cervical adenopathy.  Neurological: She is alert and oriented to person, place, and time.  Skin: Skin is warm and dry. No rash noted. She is not diaphoretic.  Psychiatric: Memory, affect and judgment normal.    Lab Results  Component Value Date   TSH 0.919 05/18/2013   Lab Results  Component Value Date   WBC 5.9 05/18/2013   HGB 14.7 05/18/2013   HCT 42.6 05/18/2013   MCV 87.5 05/18/2013   PLT 275 05/18/2013   Lab Results  Component Value Date   CREATININE 0.68 05/18/2013   BUN 14 05/18/2013   NA 140 05/18/2013   K 3.6 05/18/2013   CL 102 05/18/2013   CO2 27 05/18/2013   Lab Results  Component Value Date   ALT 18 05/18/2013   AST 15 05/18/2013   ALKPHOS 63 05/18/2013   BILITOT 0.3 05/18/2013   Lab Results  Component Value Date   CHOL 145 02/16/2013   Lab Results  Component Value Date   HDL 46 02/16/2013   Lab Results  Component Value Date   LDLCALC 76 02/16/2013   Lab Results  Component Value Date   TRIG 117 02/16/2013   Lab Results  Component Value Date   CHOLHDL 3.2 02/16/2013     Assessment & Plan  Hypertension Well controlled, no changes.  Viral respiratory  illness Improving today, encouraged probiotics, increase rest and hydration. Add Mucinex and call if symptoms worsen.  Thyroid disease tsh and freet4 wnl, no change in Synthroid dose

## 2013-05-21 NOTE — Assessment & Plan Note (Signed)
Well controlled, no changes 

## 2013-05-21 NOTE — Assessment & Plan Note (Signed)
Improving today, encouraged probiotics, increase rest and hydration. Add Mucinex and call if symptoms worsen.

## 2013-05-21 NOTE — Assessment & Plan Note (Signed)
tsh and freet4 wnl, no change in Synthroid dose

## 2013-05-22 ENCOUNTER — Telehealth: Payer: Self-pay | Admitting: *Deleted

## 2013-05-22 NOTE — Telephone Encounter (Signed)
If she chose not to go anywhere try clear liquids x 24 hours, then BRAT (bananas, rice, applesauce, toast) for 24 hours, use ginger for nausea and let us know how she is in the morning

## 2013-05-22 NOTE — Telephone Encounter (Signed)
Please advise 

## 2013-05-22 NOTE — Telephone Encounter (Signed)
[facility] Triage Call Report Triage Record Num: [id number] Operator: Karenann Cai Patient Name: Felicia Lowe Call Date & Time: 05/21/2013 10:44:52AM Patient Phone: [phone] PCP: Joaquin Courts Patient Gender: Female PCP Fax : [phone] Patient DOB: 09-10-1940 Practice Name: [facility] [place] Reason for Call: Caller: Noreene Larsson Rudolph/Other; PCP: [facility]); CB#: [phone]; reason for call: chills and vomited. Symptoms: last night (05/21/2013). Vomited x 2 within the last hour. Fever: ?. Daughter is not with patient. Daughter is going to take a thermometer to her Mother now. RN advised Daughter if patient has a fever and symptoms do not improve in the next 2 hours that patient needs to be evaluated at [facility]: Daughter agreed. Disposition: see Provider within 4 hours due to answering yes to unable to take or keep down prescription medication. Protocol(s) Used: Nausea or Vomiting Recommended Outcome per Protocol: Call Provider Immediately Override Outcome if Used in Protocol: See Provider within 4 hours RN Reason for Override Outcome: Nursing Judgement Used. Reason for Outcome: Unable to take or keep down prescription medication (heart, respiratory, diabetes, thyroid, antibiotics, birth control pills, corticosteroids) because of nausea/vomiting

## 2013-05-22 NOTE — Telephone Encounter (Signed)
pts spouse informed. pts spouse stated pt doesn't have a fever and has been sleeping

## 2013-05-23 ENCOUNTER — Other Ambulatory Visit: Payer: Self-pay | Admitting: Family

## 2013-05-23 ENCOUNTER — Other Ambulatory Visit: Payer: Self-pay | Admitting: Family Medicine

## 2013-05-23 NOTE — Telephone Encounter (Signed)
eScribe request for refill on Betamethasone dipropionate (DIPROLENE) 0.05 % cream  Last filled - 03/17/2013, #[id number] Last AEX - 09.12.14 w/PCP [Acute 11.13.14] Please Advise/SLS

## 2013-07-04 ENCOUNTER — Encounter: Payer: Self-pay | Admitting: Family Medicine

## 2013-08-11 ENCOUNTER — Encounter: Payer: Self-pay | Admitting: Family Medicine

## 2013-08-22 ENCOUNTER — Encounter: Payer: Medicare HMO | Admitting: Family Medicine

## 2013-08-23 ENCOUNTER — Other Ambulatory Visit: Payer: Self-pay | Admitting: Family Medicine

## 2013-08-29 ENCOUNTER — Other Ambulatory Visit: Payer: Self-pay | Admitting: Family Medicine

## 2013-09-22 ENCOUNTER — Encounter: Payer: Self-pay | Admitting: Family Medicine

## 2013-09-22 MED ORDER — BETAMETHASONE DIPROPIONATE 0.05 % EX CREA: TOPICAL_CREAM | Freq: Two times a day (BID) | CUTANEOUS | Status: DC | PRN

## 2013-11-10 ENCOUNTER — Telehealth: Payer: Self-pay | Admitting: Family Medicine

## 2013-11-10 ENCOUNTER — Encounter: Payer: Self-pay | Admitting: Family Medicine

## 2013-11-10 ENCOUNTER — Ambulatory Visit (INDEPENDENT_AMBULATORY_CARE_PROVIDER_SITE_OTHER): Payer: Medicare HMO | Admitting: Family Medicine

## 2013-11-10 VITALS — BP 112/70 | HR 68 | Temp 97.6°F | Ht 61.25 in | Wt 198.1 lb

## 2013-11-10 DIAGNOSIS — Z0000 Encounter for general adult medical examination without abnormal findings: Secondary | ICD-10-CM

## 2013-11-10 DIAGNOSIS — E669 Obesity, unspecified: Secondary | ICD-10-CM

## 2013-11-10 DIAGNOSIS — I10 Essential (primary) hypertension: Secondary | ICD-10-CM

## 2013-11-10 DIAGNOSIS — L304 Erythema intertrigo: Secondary | ICD-10-CM

## 2013-11-10 DIAGNOSIS — E559 Vitamin D deficiency, unspecified: Secondary | ICD-10-CM

## 2013-11-10 DIAGNOSIS — L538 Other specified erythematous conditions: Secondary | ICD-10-CM

## 2013-11-10 DIAGNOSIS — B372 Candidiasis of skin and nail: Secondary | ICD-10-CM

## 2013-11-10 LAB — CBC
HCT: 42.8 % (ref 36.0–46.0)
Hemoglobin: 14.8 g/dL (ref 12.0–15.0)
MCH: 30 pg (ref 26.0–34.0)
MCHC: 34.6 g/dL (ref 30.0–36.0)
MCV: 86.8 fL (ref 78.0–100.0)
Platelets: 302 10*3/uL (ref 150–400)
RBC: 4.93 MIL/uL (ref 3.87–5.11)
RDW: 14 % (ref 11.5–15.5)
WBC: 6.9 10*3/uL (ref 4.0–10.5)

## 2013-11-10 LAB — HEPATIC FUNCTION PANEL
ALK PHOS: 70 U/L (ref 39–117)
ALT: 16 U/L (ref 0–35)
AST: 15 U/L (ref 0–37)
Albumin: 4.3 g/dL (ref 3.5–5.2)
BILIRUBIN DIRECT: 0.1 mg/dL (ref 0.0–0.3)
BILIRUBIN INDIRECT: 0.4 mg/dL (ref 0.2–1.2)
Total Bilirubin: 0.5 mg/dL (ref 0.2–1.2)
Total Protein: 7.3 g/dL (ref 6.0–8.3)

## 2013-11-10 LAB — RENAL FUNCTION PANEL
ALBUMIN: 4.3 g/dL (ref 3.5–5.2)
BUN: 14 mg/dL (ref 6–23)
CALCIUM: 9.8 mg/dL (ref 8.4–10.5)
CO2: 27 mEq/L (ref 19–32)
CREATININE: 0.76 mg/dL (ref 0.50–1.10)
Chloride: 101 mEq/L (ref 96–112)
GLUCOSE: 84 mg/dL (ref 70–99)
Phosphorus: 3.9 mg/dL (ref 2.3–4.6)
Potassium: 4.1 mEq/L (ref 3.5–5.3)
Sodium: 140 mEq/L (ref 135–145)

## 2013-11-10 LAB — TSH: TSH: 0.491 u[IU]/mL (ref 0.350–4.500)

## 2013-11-10 MED ORDER — FLUCONAZOLE 150 MG PO TABS: 150.0000 mg | ORAL_TABLET | Freq: Once | ORAL | Status: DC

## 2013-11-10 MED ORDER — NYSTATIN 100000 UNIT/GM EX POWD: CUTANEOUS | Status: DC

## 2013-11-10 MED ORDER — TRIAMCINOLONE ACETONIDE 0.1 % EX CREA: 1.0000 "application " | TOPICAL_CREAM | Freq: Two times a day (BID) | CUTANEOUS | Status: DC

## 2013-11-10 NOTE — Telephone Encounter (Signed)
Relevant patient education assigned to patient using Emmi. ° °

## 2013-11-10 NOTE — Progress Notes (Signed)
Patient ID: Felicia Lowe, female   DOB: [birth year], 73 y.o.   MRN: [id number] Felicia Lowe [id number] Jun 27, 1941 11/10/2013      Progress Note-Follow Up  Subjective  Chief Complaint  Chief Complaint  Patient presents with  . Annual Exam    physical    HPI  Patient is a 73 year old female in today for routine medical care. Patient doing fairly well. She has developed a painful itchy rash in her groin. Has had similar lesions under her breasts. Otherwise she reports feeling fairly well and continues to struggle with chronic pain but manages well. Denies CP/palp/SOB/HA/congestion/fevers/GI or GU c/o. Taking meds as prescribed  Past Medical History  Diagnosis Date  . Thyroid disease   . Hypertension   . Vitamin D deficiency   . Chicken pox as a child  . Measles as a child  . Mumps as a child  . Osteoporosis   . Obesity   . Kidney stone 2000    nephritis as a child  . Allergy   . H/O breast biopsy     benign, on left  . Arthritis     osteoarthritis, severe, b/l knees &b/l hips  replaced  . Skin lesion of face 05/18/2012  . UTI (urinary tract infection) 12/11/2012  . BPV (benign positional vertigo) 02/19/2013    mild  . Viral respiratory illness 05/21/2013    Past Surgical History  Procedure Laterality Date  . Knee surgery      both knees replaced  . Hip surgery      both hips    Family History  Problem Relation Age of Onset  . Hyperlipidemia Mother   . Hypertension Mother   . Heart attack Mother   . Cancer Father     lung- smoker  . Heart attack Father     X 2  . Thyroid disease Brother   . Hypertension Son   . Cancer Paternal Grandmother     History   Social History  . Marital Status: Unknown    Spouse Name: N/A    Number of Children: N/A  . Years of Education: N/A   Occupational History  . Not on file.   Social History Main Topics  . Smoking status: Never Smoker   . Smokeless tobacco: Never Used  . Alcohol Use: Yes     Comment: Occasional glass  of wine  . Drug Use: No  . Sexual Activity: Not on file   Other Topics Concern  . Not on file   Social History Narrative  . No narrative on file    Current Outpatient Prescriptions on File Prior to Visit  Medication Sig Dispense Refill  . Ascorbic Acid (VITAMIN C) 1000 MG tablet Take 1,000 mg by mouth daily.      Marland Kitchen aspirin 81 MG tablet Take 81 mg by mouth daily.      . betamethasone dipropionate (DIPROLENE) 0.05 % cream Apply topically 2 (two) times daily as needed.  30 g  0  . Cholecalciferol (VITAMIN D) 2000 UNITS tablet Take 2,000 Units by mouth daily.      . Coenzyme Q10 (CO Q 10) 100 MG CAPS Take 400 mg by mouth daily.      . furosemide (LASIX) 20 MG tablet Take 1 tablet (20 mg total) by mouth daily as needed.  30 tablet  3  . lisinopril-hydrochlorothiazide (PRINZIDE,ZESTORETIC) 10-12.5 MG per tablet TAKE 1 TABLET BY MOUTH DAILY.  30 tablet  2  . nystatin (MYCOSTATIN/NYSTOP) 100000 UNIT/GM POWD  as needed. Apply twice daily to affected area.      . Omega-3 Fatty Acids (FISH OIL) 1000 MG CPDR Take 1 capsule by mouth daily.      Marland Kitchen SYNTHROID 150 MCG tablet TAKE 1 TABLET (150 MCG TOTAL) BY MOUTH DAILY BEFORE BREAKFAST.  30 tablet  3   No current facility-administered medications on file prior to visit.    Allergies  Allergen Reactions  . Eggs Or Egg-Derived Products   . Keflex [Cephalexin]     Rash   . Sulfa Antibiotics Nausea And Vomiting  . Penicillins Rash    Review of Systems  Review of Systems  Constitutional: Negative for fever and malaise/fatigue.  HENT: Negative for congestion.   Eyes: Negative for discharge.  Respiratory: Negative for shortness of breath.   Cardiovascular: Negative for chest pain, palpitations and leg swelling.  Gastrointestinal: Negative for nausea, abdominal pain and diarrhea.  Genitourinary: Negative for dysuria.  Musculoskeletal: Negative for falls.  Skin: Negative for rash.  Neurological: Negative for loss of consciousness and headaches.   Endo/Heme/Allergies: Negative for polydipsia.  Psychiatric/Behavioral: Negative for depression and suicidal ideas. The patient is not nervous/anxious and does not have insomnia.     Objective  BP 112/70  Pulse 68  Temp(Src) 97.6 F (36.4 C) (Oral)  Ht 5' 1.25" (1.556 m)  Wt 198 lb 1.9 oz (89.867 kg)  BMI 37.12 kg/m2  SpO2 97%  Physical Exam  Physical Exam  Constitutional: She is oriented to person, place, and time and well-developed, well-nourished, and in no distress. No distress.  HENT:  Head: Normocephalic and atraumatic.  Right Ear: External ear normal.  Left Ear: External ear normal.  Nose: Nose normal.  Mouth/Throat: Oropharynx is clear and moist. No oropharyngeal exudate.  Eyes: Conjunctivae are normal. Pupils are equal, round, and reactive to light. Right eye exhibits no discharge. Left eye exhibits no discharge. No scleral icterus.  Neck: Normal range of motion. Neck supple. No thyromegaly present.  Cardiovascular: Normal rate, regular rhythm, normal heart sounds and intact distal pulses.   No murmur heard. Pulmonary/Chest: Effort normal and breath sounds normal. No respiratory distress. She has no wheezes. She has no rales.  Abdominal: Soft. Bowel sounds are normal. She exhibits no distension and no mass. There is no tenderness.  Musculoskeletal: Normal range of motion. She exhibits no edema and no tenderness.  Lymphadenopathy:    She has no cervical adenopathy.  Neurological: She is alert and oriented to person, place, and time. She has normal reflexes. No cranial nerve deficit. Coordination normal.  Skin: Skin is warm and dry. No rash noted. She is not diaphoretic.  Psychiatric: Mood, memory and affect normal.    Lab Results  Component Value Date   TSH 0.919 05/18/2013   Lab Results  Component Value Date   WBC 5.9 05/18/2013   HGB 14.7 05/18/2013   HCT 42.6 05/18/2013   MCV 87.5 05/18/2013   PLT 275 05/18/2013   Lab Results  Component Value Date    CREATININE 0.68 05/18/2013   BUN 14 05/18/2013   NA 140 05/18/2013   K 3.6 05/18/2013   CL 102 05/18/2013   CO2 27 05/18/2013   Lab Results  Component Value Date   ALT 18 05/18/2013   AST 15 05/18/2013   ALKPHOS 63 05/18/2013   BILITOT 0.3 05/18/2013   Lab Results  Component Value Date   CHOL 145 02/16/2013   Lab Results  Component Value Date   HDL 46 02/16/2013   Lab  Results  Component Value Date   LDLCALC 76 02/16/2013   Lab Results  Component Value Date   TRIG 117 02/16/2013   Lab Results  Component Value Date   CHOLHDL 3.2 02/16/2013     Assessment & Plan    Vitamin d deficiency Vitamin  D level is WNL today  Obesity Encouraged DASH diet, decrease po intake and increase exercise as tolerated. Needs 7-8 hours of sleep nightly. Avoid trans fats, eat small, frequent meals every 4-5 hours with lean proteins, complex carbs and healthy fats. Minimize simple carbs, GMO foods.  Intertrigo Nystatin and Diflucan prescribed today. Continue probiotics  Medicare annual wellness visit, subsequent Patient denies any difficulties at home. No trouble with ADLs, depression or falls. No recent changes to vision or hearing. Is UTD with immunizations. Is UTD with screening. Discussed Advanced Directives, patient agrees to bring [place] copies of documents if can. Encouraged heart healthy diet, exercise as tolerated and adequate sleep.Pap and MGM in 2009, colonoscopy in 2006. Declines Tdap  Hypertension Well controlled, no changes to meds. Encouraged heart healthy diet such as the DASH diet and exercise as tolerated.

## 2013-11-10 NOTE — Progress Notes (Signed)
Pre visit review using our clinic review tool, if applicable. No additional management support is needed unless otherwise documented below in the visit note. 

## 2013-11-10 NOTE — Patient Instructions (Signed)
Candida Infection, Adult A candida infection (also called yeast, fungus and Monilia infection) is an overgrowth of yeast that can occur anywhere on the body. A yeast infection commonly occurs in warm, moist body areas. Usually, the infection remains localized but can spread to become a systemic infection. A yeast infection may be a sign of a more severe disease such as diabetes, leukemia, or AIDS. A yeast infection can occur in both men and women. In women, Candida vaginitis is a vaginal infection. It is one of the most common causes of vaginitis. Men usually do not have symptoms or know they have an infection until other problems develop. Men may find out they have a yeast infection because their sex partner has a yeast infection. Uncircumcised men are more likely to get a yeast infection than circumcised men. This is because the uncircumcised glans is not exposed to air and does not remain as dry as that of a circumcised glans. Older adults may develop yeast infections around dentures. CAUSES  Women  Antibiotics.  Steroid medication taken for a long time.  Being overweight (obese).  Diabetes.  Poor immune condition.  Certain serious medical conditions.  Immune suppressive medications for organ transplant patients.  Chemotherapy.  Pregnancy.  Menstration.  Stress and fatigue.  Intravenous drug use.  Oral contraceptives.  Wearing tight-fitting clothes in the crotch area.  Catching it from a sex partner who has a yeast infection.  Spermicide.  Intravenous, urinary, or other catheters. Men  Catching it from a sex partner who has a yeast infection.  Having oral or anal sex with a person who has the infection.  Spermicide.  Diabetes.  Antibiotics.  Poor immune system.  Medications that suppress the immune system.  Intravenous drug use.  Intravenous, urinary, or other catheters. SYMPTOMS  Women  Thick, white vaginal discharge.  Vaginal itching.  Redness and  swelling in and around the vagina.  Irritation of the lips of the vagina and perineum.  Blisters on the vaginal lips and perineum.  Painful sexual intercourse.  Low blood sugar (hypoglycemia).  Painful urination.  Bladder infections.  Intestinal problems such as constipation, indigestion, bad breath, bloating, increase in gas, diarrhea, or loose stools. Men  Men may develop intestinal problems such as constipation, indigestion, bad breath, bloating, increase in gas, diarrhea, or loose stools.  Dry, cracked skin on the penis with itching or discomfort.  Jock itch.  Dry, flaky skin.  Athlete's foot.  Hypoglycemia. DIAGNOSIS  Women  A history and an exam are performed.  The discharge may be examined under a microscope.  A culture may be taken of the discharge. Men  A history and an exam are performed.  Any discharge from the penis or areas of cracked skin will be looked at under the microscope and cultured.  Stool samples may be cultured. TREATMENT  Women  Vaginal antifungal suppositories and creams.  Medicated creams to decrease irritation and itching on the outside of the vagina.  Warm compresses to the perineal area to decrease swelling and discomfort.  Oral antifungal medications.  Medicated vaginal suppositories or cream for repeated or recurrent infections.  Wash and dry the irritation areas before applying the cream.  Eating yogurt with lactobacillus may help with prevention and treatment.  Sometimes painting the vagina with gentian violet solution may help if creams and suppositories do not work. Men  Antifungal creams and oral antifungal medications.  Sometimes treatment must continue for 30 days after the symptoms go away to prevent recurrence. HOME CARE  INSTRUCTIONS  Women  Use cotton underwear and avoid tight-fitting clothing.  Avoid colored, scented toilet paper and deodorant tampons or pads.  Do not douche.  Keep your diabetes  under control.  Finish all the prescribed medications.  Keep your skin clean and dry.  Consume milk or yogurt with lactobacillus active culture regularly. If you get frequent yeast infections and think that is what the infection is, there are over-the-counter medications that you can get. If the infection does not show healing in 3 days, talk to your caregiver.  Tell your sex partner you have a yeast infection. Your partner may need treatment also, especially if your infection does not clear up or recurs. Men  Keep your skin clean and dry.  Keep your diabetes under control.  Finish all prescribed medications.  Tell your sex partner that you have a yeast infection so they can be treated if necessary. SEEK MEDICAL CARE IF:   Your symptoms do not clear up or worsen in one week after treatment.  You have an oral temperature above 102 F (38.9 C).  You have trouble swallowing or eating for a prolonged time.  You develop blisters on and around your vagina.  You develop vaginal bleeding and it is not your menstrual period.  You develop abdominal pain.  You develop intestinal problems as mentioned above.  You get weak or lightheaded.  You have painful or increased urination.  You have pain during sexual intercourse. MAKE SURE YOU:   Understand these instructions.  Will watch your condition.  Will get help right away if you are not doing well or get worse. Document Released: 07/30/2004 Document Revised: 09/14/2011 Document Reviewed: 11/11/2009 [facility] Patient Information 2014 [place].

## 2013-11-11 LAB — VITAMIN D 25 HYDROXY (VIT D DEFICIENCY, FRACTURES): VIT D 25 HYDROXY: 44 ng/mL (ref 30–89)

## 2013-11-15 NOTE — Assessment & Plan Note (Signed)
Encouraged DASH diet, decrease po intake and increase exercise as tolerated. Needs 7-8 hours of sleep nightly. Avoid trans fats, eat small, frequent meals every 4-5 hours with lean proteins, complex carbs and healthy fats. Minimize simple carbs, GMO foods. 

## 2013-11-15 NOTE — Assessment & Plan Note (Signed)
Patient denies any difficulties at home. No trouble with ADLs, depression or falls. No recent changes to vision or hearing. Is UTD with immunizations. Is UTD with screening. Discussed Advanced Directives, patient agrees to bring [place] copies of documents if can. Encouraged heart healthy diet, exercise as tolerated and adequate sleep.Pap and MGM in 2009, colonoscopy in 2006. Declines Tdap

## 2013-11-15 NOTE — Assessment & Plan Note (Signed)
Vitamin  D level is WNL today

## 2013-11-15 NOTE — Assessment & Plan Note (Signed)
Well controlled, no changes to meds. Encouraged heart healthy diet such as the DASH diet and exercise as tolerated.  °

## 2013-11-15 NOTE — Assessment & Plan Note (Signed)
Nystatin and Diflucan prescribed today. Continue probiotics

## 2013-11-21 ENCOUNTER — Other Ambulatory Visit: Payer: Self-pay | Admitting: Family Medicine

## 2013-11-21 ENCOUNTER — Encounter: Payer: Self-pay | Admitting: Family Medicine

## 2013-11-21 DIAGNOSIS — B372 Candidiasis of skin and nail: Secondary | ICD-10-CM

## 2013-11-21 MED ORDER — FLUCONAZOLE 150 MG PO TABS: 150.0000 mg | ORAL_TABLET | ORAL | Status: DC

## 2013-11-28 ENCOUNTER — Other Ambulatory Visit: Payer: Self-pay | Admitting: Family Medicine

## 2013-12-14 ENCOUNTER — Encounter: Payer: Self-pay | Admitting: Family Medicine

## 2013-12-14 MED ORDER — NYSTATIN 100000 UNIT/GM EX POWD: CUTANEOUS | Status: DC

## 2014-01-07 ENCOUNTER — Encounter: Payer: Self-pay | Admitting: Family Medicine

## 2014-01-08 ENCOUNTER — Encounter: Payer: Self-pay | Admitting: Family

## 2014-01-08 ENCOUNTER — Ambulatory Visit (INDEPENDENT_AMBULATORY_CARE_PROVIDER_SITE_OTHER): Payer: Medicare HMO | Admitting: Family

## 2014-01-08 ENCOUNTER — Other Ambulatory Visit: Payer: Self-pay | Admitting: Family Medicine

## 2014-01-08 VITALS — BP 120/84 | HR 76 | Temp 98.4°F | Resp 16 | Ht 61.25 in | Wt 196.0 lb

## 2014-01-08 DIAGNOSIS — L255 Unspecified contact dermatitis due to plants, except food: Secondary | ICD-10-CM

## 2014-01-08 DIAGNOSIS — L237 Allergic contact dermatitis due to plants, except food: Secondary | ICD-10-CM

## 2014-01-08 MED ORDER — PREDNISONE 10 MG PO TABS: ORAL_TABLET | ORAL | Status: DC

## 2014-01-08 MED ORDER — METHYLPREDNISOLONE SODIUM SUCC 125 MG IJ SOLR
125.0000 mg | Freq: Once | INTRAMUSCULAR | Status: AC
  Administered 2014-01-08: 125 mg via INTRAMUSCULAR

## 2014-01-08 NOTE — Progress Notes (Signed)
Subjective:    Patient ID: Felicia Lowe, female    DOB: 03-24-1941, 73 y.o.   MRN: [id number]  HPI  [name] is a 73 yr old female who presents today with chief complaint of rash. Itching started Wednesday [place] farm.  Has been trying benadryl gel, loratidine.  Mild relief.     Review of Systems See HPI  Past Medical History  Diagnosis Date  . Thyroid disease   . Hypertension   . Vitamin D deficiency   . Chicken pox as a child  . Measles as a child  . Mumps as a child  . Osteoporosis   . Obesity   . Kidney stone 2000    nephritis as a child  . Allergy   . H/O breast biopsy     benign, on left  . Arthritis     osteoarthritis, severe, b/l knees &b/l hips  replaced  . Skin lesion of face 05/18/2012  . UTI (urinary tract infection) 12/11/2012  . BPV (benign positional vertigo) 02/19/2013    mild  . Viral respiratory illness 05/21/2013    History   Social History  . Marital Status: Unknown    Spouse Name: N/A    Number of Children: N/A  . Years of Education: N/A   Occupational History  . Not on file.   Social History Main Topics  . Smoking status: Never Smoker   . Smokeless tobacco: Never Used  . Alcohol Use: Yes     Comment: Occasional glass of wine  . Drug Use: No  . Sexual Activity: Not on file   Other Topics Concern  . Not on file   Social History Narrative  . No narrative on file    Past Surgical History  Procedure Laterality Date  . Knee surgery      both knees replaced  . Hip surgery      both hips    Family History  Problem Relation Age of Onset  . Hyperlipidemia Mother   . Hypertension Mother   . Heart attack Mother   . Cancer Father     lung- smoker  . Heart attack Father     X 2  . Thyroid disease Brother   . Hypertension Son   . Cancer Paternal Grandmother     Allergies  Allergen Reactions  . Eggs Or Egg-Derived Products   . Keflex [Cephalexin]     Rash   . Sulfa Antibiotics Nausea And  Vomiting  . Penicillins Rash    Current Outpatient Prescriptions on File Prior to Visit  Medication Sig Dispense Refill  . Ascorbic Acid (VITAMIN C) 1000 MG tablet Take 1,000 mg by mouth daily.      Marland Kitchen aspirin 81 MG tablet Take 81 mg by mouth daily.      . Cholecalciferol (VITAMIN D) 2000 UNITS tablet Take 2,000 Units by mouth daily.      . Coenzyme Q10 (CO Q 10) 100 MG CAPS Take 400 mg by mouth daily.      . furosemide (LASIX) 20 MG tablet Take 1 tablet (20 mg total) by mouth daily as needed.  30 tablet  3  . lisinopril-hydrochlorothiazide (PRINZIDE,ZESTORETIC) 10-12.5 MG per tablet TAKE 1 TABLET BY MOUTH DAILY.  30 tablet  2  . nystatin (MYCOSTATIN/NYSTOP) 100000 UNIT/GM POWD 1 tsp. Po tid X 7 days  30 g  0  . Omega-3 Fatty Acids (FISH OIL) 1000 MG CPDR Take 1 capsule by mouth daily.      Marland Kitchen  SYNTHROID 150 MCG tablet TAKE 1 TABLET (150 MCG TOTAL) BY MOUTH DAILY BEFORE BREAKFAST.  30 tablet  3  . triamcinolone cream (KENALOG) 0.1 % Apply 1 application topically 2 (two) times daily.  85.2 g  3   No current facility-administered medications on file prior to visit.    BP 120/84  Pulse 76  Temp(Src) 98.4 F (36.9 C) (Oral)  Resp 16  Ht 5' 1.25" (1.556 m)  Wt 196 lb 0.6 oz (88.923 kg)  BMI 36.73 kg/m2  SpO2 97%       Objective:   Physical Exam  Constitutional: She is oriented to person, place, and time. She appears well-developed and well-nourished. No distress.  Neurological: She is alert and oriented to person, place, and time.  Skin:  Erythematous blistered rash noted arms/legs/neck, eyelids.          Assessment & Plan:

## 2014-01-08 NOTE — Telephone Encounter (Signed)
FYI

## 2014-01-08 NOTE — Patient Instructions (Signed)
Start prednisone.  Call if symptoms worsen, or if symptoms do not improve.  Poison [employer] ivy is a inflammation of the skin (contact dermatitis) caused by touching the allergens on the leaves of the ivy plant following previous exposure to the plant. The rash usually appears 48 hours after exposure. The rash is usually bumps (papules) or blisters (vesicles) in a linear pattern. Depending on your own sensitivity, the rash may simply cause redness and itching, or it may also progress to blisters which may break open. These must be well cared for to prevent secondary bacterial (germ) infection, followed by scarring. Keep any open areas dry, clean, dressed, and covered with an antibacterial ointment if needed. The eyes may also get puffy. The puffiness is worst in the morning and gets better as the day progresses. This dermatitis usually heals without scarring, within 2 to 3 weeks without treatment. HOME CARE INSTRUCTIONS  Thoroughly wash with soap and water as soon as you have been exposed to poison ivy. You have about one half hour to remove the plant resin before it will cause the rash. This washing will destroy the oil or antigen on the skin that is causing, or will cause, the rash. Be sure to wash under your fingernails as any plant resin there will continue to spread the rash. Do not rub skin vigorously when washing affected area. Poison ivy cannot spread if no oil from the plant remains on your body. A rash that has progressed to weeping sores will not spread the rash unless you have not washed thoroughly. It is also important to wash any clothes you have been wearing as these may carry active allergens. The rash will return if you wear the unwashed clothing, even several days later. Avoidance of the plant in the future is the best measure. Poison ivy plant can be recognized by the number of leaves. Generally, poison ivy has three leaves with flowering branches on a single stem. Diphenhydramine may be  purchased over the counter and used as needed for itching. Do not drive with this medication if it makes you drowsy.Ask your caregiver about medication for children. SEEK MEDICAL CARE IF:  Open sores develop.  Redness spreads beyond area of rash.  You notice purulent (pus-like) discharge.  You have increased pain.  Other signs of infection develop (such as fever). Document Released: 06/19/2000 Document Revised: 09/14/2011 Document Reviewed: 05/08/2009 [facility] Patient Information 2015 [place], [place]. This information is not intended to replace advice given to you by your health care provider. Make sure you discuss any questions you have with your health care provider.

## 2014-01-09 ENCOUNTER — Encounter: Payer: Self-pay | Admitting: Family Medicine

## 2014-01-11 DIAGNOSIS — L237 Allergic contact dermatitis due to plants, except food: Secondary | ICD-10-CM | POA: Insufficient documentation

## 2014-01-11 NOTE — Assessment & Plan Note (Signed)
IM solumedrol given in office. To be followed by PO prednisone.  Recommended caladryl clear topical and prn benadryl for itching.

## 2014-01-18 ENCOUNTER — Telehealth: Payer: Self-pay | Admitting: Family Medicine

## 2014-01-18 NOTE — Telephone Encounter (Signed)
Patient informed, understood & agreed; scheduled appt with Elyn Aquas at 1:15p on 07.17.15/SLS

## 2014-01-18 NOTE — Telephone Encounter (Signed)
Felicia Lowe is not getting any better, please advise.

## 2014-01-18 NOTE — Telephone Encounter (Signed)
Lets bring her back into the office.  Need to make sure that it has not become infected.

## 2014-01-19 ENCOUNTER — Ambulatory Visit (INDEPENDENT_AMBULATORY_CARE_PROVIDER_SITE_OTHER): Payer: Medicare HMO | Admitting: Physician Assistant

## 2014-01-19 ENCOUNTER — Encounter: Payer: Self-pay | Admitting: Physician Assistant

## 2014-01-19 VITALS — BP 114/68 | HR 110 | Temp 98.3°F | Resp 16 | Ht 61.25 in | Wt 196.0 lb

## 2014-01-19 DIAGNOSIS — L309 Dermatitis, unspecified: Secondary | ICD-10-CM

## 2014-01-19 DIAGNOSIS — L259 Unspecified contact dermatitis, unspecified cause: Secondary | ICD-10-CM

## 2014-01-19 MED ORDER — METHYLPREDNISOLONE ACETATE 80 MG/ML IJ SUSP
60.0000 mg | Freq: Once | INTRAMUSCULAR | Status: AC
  Administered 2014-01-19: 60 mg via INTRAMUSCULAR

## 2014-01-19 MED ORDER — PREDNISONE 20 MG PO TABS: ORAL_TABLET | ORAL | Status: DC

## 2014-01-19 NOTE — Progress Notes (Signed)
Pre visit review using our clinic review tool, if applicable. No additional management support is needed unless otherwise documented below in the visit note/SLS  

## 2014-01-19 NOTE — Patient Instructions (Signed)
Please take medication as directed.  Continue Kenalog cream.  Take a daily claritin.  Cool compresses for itch.  Avoid heat and sunlight.  You will be contacted by a dermatologist for further evaluation giving the persistence of your symptoms.

## 2014-01-21 DIAGNOSIS — L309 Dermatitis, unspecified: Secondary | ICD-10-CM | POA: Insufficient documentation

## 2014-01-21 NOTE — Progress Notes (Signed)
Patient presents to clinic today c/o continued pruritic rash of body.  Patient was seen > 1 week ago and diagnosed with contact dermatitis.  Prednisone 5-day taper given.  Patient endorses improvement in rash initially with steroid, but states rash came back upon completion of steroid pack.  Denies change in lotions, detergents or hygiene products.  Denies new pet.  Denies contact with similar rash.   Past Medical History  Diagnosis Date  . Thyroid disease   . Hypertension   . Vitamin D deficiency   . Chicken pox as a child  . Measles as a child  . Mumps as a child  . Osteoporosis   . Obesity   . Kidney stone 2000    nephritis as a child  . Allergy   . H/O breast biopsy     benign, on left  . Arthritis     osteoarthritis, severe, b/l knees &b/l hips  replaced  . Skin lesion of face 05/18/2012  . UTI (urinary tract infection) 12/11/2012  . BPV (benign positional vertigo) 02/19/2013    mild  . Viral respiratory illness 05/21/2013    Current Outpatient Prescriptions on File Prior to Visit  Medication Sig Dispense Refill  . Ascorbic Acid (VITAMIN C) 1000 MG tablet Take 1,000 mg by mouth daily.      Marland Kitchen aspirin 81 MG tablet Take 81 mg by mouth daily.      . Cholecalciferol (VITAMIN D) 2000 UNITS tablet Take 2,000 Units by mouth daily.      . Coenzyme Q10 (CO Q 10) 100 MG CAPS Take 400 mg by mouth daily.      . furosemide (LASIX) 20 MG tablet Take 1 tablet (20 mg total) by mouth daily as needed.  30 tablet  3  . lisinopril-hydrochlorothiazide (PRINZIDE,ZESTORETIC) 10-12.5 MG per tablet TAKE 1 TABLET BY MOUTH DAILY.  30 tablet  2  . Omega-3 Fatty Acids (FISH OIL) 1000 MG CPDR Take 1 capsule by mouth daily.      Marland Kitchen SYNTHROID 150 MCG tablet TAKE 1 TABLET (150 MCG TOTAL) BY MOUTH DAILY BEFORE BREAKFAST.  30 tablet  3  . triamcinolone cream (KENALOG) 0.1 % Apply 1 application topically 2 (two) times daily.  85.2 g  3   No current facility-administered medications on file prior to visit.     Allergies  Allergen Reactions  . Eggs Or Egg-Derived Products   . Keflex [Cephalexin]     Rash   . Sulfa Antibiotics Nausea And Vomiting  . Penicillins Rash  . Poison Ivy Extract [Extract Of Poison Ivy] Rash    Family History  Problem Relation Age of Onset  . Hyperlipidemia Mother   . Hypertension Mother   . Heart attack Mother   . Cancer Father     lung- smoker  . Heart attack Father     X 2  . Thyroid disease Brother   . Hypertension Son   . Cancer Paternal Grandmother     History   Social History  . Marital Status: Unknown    Spouse Name: N/A    Number of Children: N/A  . Years of Education: N/A   Social History Main Topics  . Smoking status: Never Smoker   . Smokeless tobacco: Never Used  . Alcohol Use: Yes     Comment: Occasional glass of wine  . Drug Use: No  . Sexual Activity: None   Other Topics Concern  . None   Social History Narrative  . None  Review of Systems - See HPI.  All other ROS are negative.  BP 114/68  Pulse 110  Temp(Src) 98.3 F (36.8 C) (Oral)  Resp 16  Ht 5' 1.25" (1.556 m)  Wt 196 lb (88.905 kg)  BMI 36.72 kg/m2  SpO2 97%  Physical Exam  Vitals reviewed. Constitutional: She is oriented to person, place, and time and well-developed, well-nourished, and in no distress.  HENT:  Head: Normocephalic and atraumatic.  Eyes: Conjunctivae are normal.  Cardiovascular: Normal rate, regular rhythm, normal heart sounds and intact distal pulses.   Pulmonary/Chest: Effort normal and breath sounds normal.  Neurological: She is alert and oriented to person, place, and time.  Skin: Skin is warm and dry.  Presence of an erythematous maculopapular rash scattered over integument, sparing the neck and face.     Recent Results (from the past 2160 hour(s))  CBC     Status: None   Collection Time    11/10/13 11:47 AM      Result Value Ref Range   WBC 6.9  4.0 - 10.5 K/uL   RBC 4.93  3.87 - 5.11 MIL/uL   Hemoglobin 14.8  12.0 -  15.0 g/dL   HCT 42.8  36.0 - 46.0 %   MCV 86.8  78.0 - 100.0 fL   MCH 30.0  26.0 - 34.0 pg   MCHC 34.6  30.0 - 36.0 g/dL   RDW 14.0  11.5 - 15.5 %   Platelets 302  150 - 400 K/uL  VITAMIN D 25 HYDROXY     Status: None   Collection Time    11/10/13 11:47 AM      Result Value Ref Range   Vit D, 25-Hydroxy 44  30 - 89 ng/mL   Comment: This assay accurately quantifies Vitamin D, which is the sum of the     25-Hydroxy forms of Vitamin D2 and D3.  Studies have shown that the     optimum concentration of 25-Hydroxy Vitamin D is 30 ng/mL or higher.      Concentrations of Vitamin D between 20 and 29 ng/mL are considered to     be insufficient and concentrations less than 20 ng/mL are considered     to be deficient for Vitamin D.  RENAL FUNCTION PANEL     Status: None   Collection Time    11/10/13 11:47 AM      Result Value Ref Range   Sodium 140  135 - 145 mEq/L   Potassium 4.1  3.5 - 5.3 mEq/L   Chloride 101  96 - 112 mEq/L   CO2 27  19 - 32 mEq/L   Glucose, Bld 84  70 - 99 mg/dL   BUN 14  6 - 23 mg/dL   Creat 0.76  0.50 - 1.10 mg/dL   Albumin 4.3  3.5 - 5.2 g/dL   Calcium 9.8  8.4 - 10.5 mg/dL   Phosphorus 3.9  2.3 - 4.6 mg/dL  HEPATIC FUNCTION PANEL     Status: None   Collection Time    11/10/13 11:47 AM      Result Value Ref Range   Total Bilirubin 0.5  0.2 - 1.2 mg/dL   Bilirubin, Direct 0.1  0.0 - 0.3 mg/dL   Indirect Bilirubin 0.4  0.2 - 1.2 mg/dL   Alkaline Phosphatase 70  39 - 117 U/L   AST 15  0 - 37 U/L   ALT 16  0 - 35 U/L   Total Protein 7.3  6.0 -  8.3 g/dL   Albumin 4.3  3.5 - 5.2 g/dL  TSH     Status: None   Collection Time    11/10/13 11:47 AM      Result Value Ref Range   TSH 0.491  0.350 - 4.500 uIU/mL    Assessment/Plan: Dermatitis Initially felt to be contact dermatitis with rhus species.  Symptoms are persistent despite steroid therapy.  Possibly rebound dermatitis due to too short of a steroid course.  Will Rx Prednisone taper and taper over > 1 week.   Referral to dermatology placed for second opinion if symptoms do not begin to respond.

## 2014-01-21 NOTE — Assessment & Plan Note (Addendum)
Initially felt to be contact dermatitis with rhus species.  Symptoms are persistent despite steroid therapy.  Possibly rebound dermatitis due to too short of a steroid course.  Will Rx Prednisone taper and taper over > 1 week.  Referral to dermatology placed for second opinion if symptoms do not begin to respond.

## 2014-01-22 ENCOUNTER — Encounter: Payer: Self-pay | Admitting: Family Medicine

## 2014-01-24 ENCOUNTER — Encounter: Payer: Self-pay | Admitting: Family Medicine

## 2014-01-28 ENCOUNTER — Other Ambulatory Visit: Payer: Self-pay | Admitting: Family Medicine

## 2014-05-10 ENCOUNTER — Encounter: Payer: Self-pay | Admitting: Family Medicine

## 2014-05-17 ENCOUNTER — Ambulatory Visit (INDEPENDENT_AMBULATORY_CARE_PROVIDER_SITE_OTHER): Payer: Medicare HMO | Admitting: Family Medicine

## 2014-05-17 ENCOUNTER — Encounter: Payer: Self-pay | Admitting: Family Medicine

## 2014-05-17 ENCOUNTER — Ambulatory Visit (INDEPENDENT_AMBULATORY_CARE_PROVIDER_SITE_OTHER): Payer: Medicare HMO

## 2014-05-17 VITALS — BP 121/54 | HR 74 | Temp 98.5°F | Ht 61.25 in | Wt 195.0 lb

## 2014-05-17 DIAGNOSIS — M1990 Unspecified osteoarthritis, unspecified site: Secondary | ICD-10-CM

## 2014-05-17 DIAGNOSIS — E669 Obesity, unspecified: Secondary | ICD-10-CM

## 2014-05-17 DIAGNOSIS — E559 Vitamin D deficiency, unspecified: Secondary | ICD-10-CM

## 2014-05-17 DIAGNOSIS — E079 Disorder of thyroid, unspecified: Secondary | ICD-10-CM

## 2014-05-17 DIAGNOSIS — I10 Essential (primary) hypertension: Secondary | ICD-10-CM

## 2014-05-17 DIAGNOSIS — Z23 Encounter for immunization: Secondary | ICD-10-CM

## 2014-05-17 DIAGNOSIS — K219 Gastro-esophageal reflux disease without esophagitis: Secondary | ICD-10-CM

## 2014-05-17 LAB — TSH: TSH: 0.06 u[IU]/mL — ABNORMAL LOW (ref 0.35–4.50)

## 2014-05-17 LAB — RENAL FUNCTION PANEL
Albumin: 3.4 g/dL — ABNORMAL LOW (ref 3.5–5.2)
BUN: 13 mg/dL (ref 6–23)
CHLORIDE: 106 meq/L (ref 96–112)
CO2: 26 mEq/L (ref 19–32)
CREATININE: 0.7 mg/dL (ref 0.4–1.2)
Calcium: 9.3 mg/dL (ref 8.4–10.5)
GFR: 85.7 mL/min (ref >60.00)
GLUCOSE: 94 mg/dL (ref 70–99)
Phosphorus: 4.1 mg/dL (ref 2.3–4.6)
Potassium: 3.9 mEq/L (ref 3.5–5.1)
Sodium: 140 mEq/L (ref 135–145)

## 2014-05-17 LAB — VITAMIN D 25 HYDROXY (VIT D DEFICIENCY, FRACTURES): VITD: 43.29 ng/mL (ref 30.00–100.00)

## 2014-05-17 LAB — CBC
HCT: 43.6 % (ref 36.0–46.0)
Hemoglobin: 14.1 g/dL (ref 12.0–15.0)
MCHC: 32.5 g/dL (ref 30.0–36.0)
MCV: 91.2 fl (ref 78.0–100.0)
PLATELETS: 278 10*3/uL (ref 150.0–400.0)
RBC: 4.78 Mil/uL (ref 3.87–5.11)
RDW: 13.3 % (ref 11.5–15.5)
WBC: 5.4 10*3/uL (ref 4.0–10.5)

## 2014-05-17 LAB — HEPATIC FUNCTION PANEL
ALT: 14 U/L (ref 0–35)
AST: 15 U/L (ref 0–37)
Albumin: 3.4 g/dL — ABNORMAL LOW (ref 3.5–5.2)
Alkaline Phosphatase: 64 U/L (ref 39–117)
Bilirubin, Direct: 0 mg/dL (ref 0.0–0.3)
Total Bilirubin: 0.3 mg/dL (ref 0.2–1.2)
Total Protein: 7 g/dL (ref 6.0–8.3)

## 2014-05-17 NOTE — Progress Notes (Signed)
Pre visit review using our clinic review tool, if applicable. No additional management support is needed unless otherwise documented below in the visit note. 

## 2014-05-17 NOTE — Patient Instructions (Signed)

## 2014-05-17 NOTE — Progress Notes (Signed)
Patient ID: Felicia Lowe, female   DOB: [birth year], 73 y.o.   MRN: [id number] Felicia Lowe [id number] 06/23/1941 05/17/2014      Progress Note-Follow Up  Subjective  Chief Complaint  Chief Complaint  Patient presents with  . Follow-up    6 month  . Injections    flu    HPI  Patient is a 73 year old female in today for routine medical care. Patient is in today. Was having increased dyspepsia and cough but she has altered diet with less gluten and more veg and her symptoms have improved. No new or acute illness. Continues to struggle with chronic pain but it is manageable with various modalities including meds and chiropractic care. Denies CP/palp/SOB/HA/congestion/fevers/GI or GU c/o. Taking meds as prescribed  Past Medical History  Diagnosis Date  . Thyroid disease   . Hypertension   . Vitamin D deficiency   . Chicken pox as a child  . Measles as a child  . Mumps as a child  . Osteoporosis   . Obesity   . Kidney stone 2000    nephritis as a child  . Allergy   . H/O breast biopsy     benign, on left  . Arthritis     osteoarthritis, severe, b/l knees &b/l hips  replaced  . Skin lesion of face 05/18/2012  . UTI (urinary tract infection) 12/11/2012  . BPV (benign positional vertigo) 02/19/2013    mild  . Viral respiratory illness 05/21/2013    Past Surgical History  Procedure Laterality Date  . Knee surgery      both knees replaced  . Hip surgery      both hips    Family History  Problem Relation Age of Onset  . Hyperlipidemia Mother   . Hypertension Mother   . Heart attack Mother   . Cancer Father     lung- smoker  . Heart attack Father     X 2  . Thyroid disease Brother   . Hypertension Son   . Cancer Paternal Grandmother     History   Social History  . Marital Status: Unknown    Spouse Name: N/A    Number of Children: N/A  . Years of Education: N/A   Occupational History  . Not on file.   Social History Main Topics  . Smoking status: Never  Smoker   . Smokeless tobacco: Never Used  . Alcohol Use: Yes     Comment: Occasional glass of wine  . Drug Use: No  . Sexual Activity: Not on file   Other Topics Concern  . Not on file   Social History Narrative    Current Outpatient Prescriptions on File Prior to Visit  Medication Sig Dispense Refill  . Ascorbic Acid (VITAMIN C) 1000 MG tablet Take 1,000 mg by mouth daily.    Marland Kitchen aspirin 81 MG tablet Take 81 mg by mouth daily.    . Cholecalciferol (VITAMIN D) 2000 UNITS tablet Take 2,000 Units by mouth daily.    . Coenzyme Q10 (CO Q 10) 100 MG CAPS Take 400 mg by mouth daily.    . furosemide (LASIX) 20 MG tablet Take 1 tablet (20 mg total) by mouth daily as needed. 30 tablet 3  . lisinopril-hydrochlorothiazide (PRINZIDE,ZESTORETIC) 10-12.5 MG per tablet TAKE 1 TABLET BY MOUTH DAILY. 30 tablet 2  . nystatin (MYCOSTATIN/NYSTOP) 100000 UNIT/GM POWD as needed. 1 tsp. Po tid X 7 days    . Omega-3 Fatty Acids (FISH OIL) 1000 MG  CPDR Take 1 capsule by mouth daily.    Marland Kitchen SYNTHROID 150 MCG tablet TAKE 1 TABLET (150 MCG TOTAL) BY MOUTH DAILY BEFORE BREAKFAST. 30 tablet 3  . triamcinolone cream (KENALOG) 0.1 % Apply 1 application topically 2 (two) times daily. 85.2 g 3   No current facility-administered medications on file prior to visit.    Allergies  Allergen Reactions  . Eggs Or Egg-Derived Products   . Keflex [Cephalexin]     Rash   . Sulfa Antibiotics Nausea And Vomiting  . Penicillins Rash  . Poison Ivy Extract [Extract Of Poison Ivy] Rash    Review of Systems  Review of Systems  Constitutional: Negative for fever and malaise/fatigue.  HENT: Negative for congestion.   Eyes: Negative for discharge.  Respiratory: Negative for shortness of breath.   Cardiovascular: Negative for chest pain, palpitations and leg swelling.  Gastrointestinal: Negative for nausea, abdominal pain and diarrhea.  Genitourinary: Negative for dysuria.  Musculoskeletal: Negative for falls.  Skin:  Negative for rash.  Neurological: Negative for loss of consciousness and headaches.  Endo/Heme/Allergies: Negative for polydipsia.  Psychiatric/Behavioral: Negative for depression and suicidal ideas. The patient is not nervous/anxious and does not have insomnia.     Objective  BP 121/54 mmHg  Pulse 74  Temp(Src) 98.5 F (36.9 C) (Oral)  Ht 5' 1.25" (1.556 m)  Wt 195 lb (88.451 kg)  BMI 36.53 kg/m2  SpO2 96%  Physical Exam  Physical Exam  Constitutional: She is oriented to person, place, and time and well-developed, well-nourished, and in no distress. No distress.  HENT:  Head: Normocephalic and atraumatic.  Eyes: Conjunctivae are normal.  Neck: Neck supple. No thyromegaly present.  Cardiovascular: Normal rate, regular rhythm and normal heart sounds.   No murmur heard. Pulmonary/Chest: Effort normal and breath sounds normal. She has no wheezes.  Abdominal: She exhibits no distension and no mass.  Musculoskeletal: She exhibits no edema.  Lymphadenopathy:    She has no cervical adenopathy.  Neurological: She is alert and oriented to person, place, and time.  Skin: Skin is warm and dry. No rash noted. She is not diaphoretic.  Psychiatric: Memory, affect and judgment normal.    Lab Results  Component Value Date   TSH 0.491 11/10/2013   Lab Results  Component Value Date   WBC 6.9 11/10/2013   HGB 14.8 11/10/2013   HCT 42.8 11/10/2013   MCV 86.8 11/10/2013   PLT 302 11/10/2013   Lab Results  Component Value Date   CREATININE 0.76 11/10/2013   BUN 14 11/10/2013   NA 140 11/10/2013   K 4.1 11/10/2013   CL 101 11/10/2013   CO2 27 11/10/2013   Lab Results  Component Value Date   ALT 16 11/10/2013   AST 15 11/10/2013   ALKPHOS 70 11/10/2013   BILITOT 0.5 11/10/2013   Lab Results  Component Value Date   CHOL 145 02/16/2013   Lab Results  Component Value Date   HDL 46 02/16/2013   Lab Results  Component Value Date   LDLCALC 76 02/16/2013   Lab Results   Component Value Date   TRIG 117 02/16/2013   Lab Results  Component Value Date   CHOLHDL 3.2 02/16/2013     Assessment & Plan  Thyroid disease Overtreated, will decrease Levothyroxine to 6 days a week. reasses in 10 to 12 weeks  Vitamin d deficiency Taking Vitamin d 5000 IU daily  Obesity Encouraged DASH diet, decrease po intake and increase exercise as tolerated. Needs  7-8 hours of sleep nightly. Avoid trans fats, eat small, frequent meals every 4-5 hours with lean proteins, complex carbs and healthy fats. Minimize simple carbs  Arthritis Struggling with chronic pain but manages daily with numerous modalities.   Hypertension Well controlled, no changes to meds. Encouraged heart healthy diet such as the DASH diet and exercise as tolerated.   GERD (gastroesophageal reflux disease) Has changed diet and her symptoms are better, she is eating no gluten and has increased veggies. Less cough and less dyspepsia.

## 2014-05-27 ENCOUNTER — Encounter: Payer: Self-pay | Admitting: Family Medicine

## 2014-05-27 DIAGNOSIS — K219 Gastro-esophageal reflux disease without esophagitis: Secondary | ICD-10-CM

## 2014-05-27 HISTORY — DX: Gastro-esophageal reflux disease without esophagitis: K21.9

## 2014-05-27 NOTE — Assessment & Plan Note (Signed)
Encouraged DASH diet, decrease po intake and increase exercise as tolerated. Needs 7-8 hours of sleep nightly. Avoid trans fats, eat small, frequent meals every 4-5 hours with lean proteins, complex carbs and healthy fats. Minimize simple carbs 

## 2014-05-27 NOTE — Assessment & Plan Note (Signed)
Struggling with chronic pain but manages daily with numerous modalities.

## 2014-05-27 NOTE — Assessment & Plan Note (Signed)
Taking Vitamin d 5000 IU daily

## 2014-05-27 NOTE — Assessment & Plan Note (Signed)
Has changed diet and her symptoms are better, she is eating no gluten and has increased veggies. Less cough and less dyspepsia.

## 2014-05-27 NOTE — Assessment & Plan Note (Signed)
Overtreated, will decrease Levothyroxine to 6 days a week. reasses in 10 to 12 weeks

## 2014-05-27 NOTE — Assessment & Plan Note (Signed)
Well controlled, no changes to meds. Encouraged heart healthy diet such as the DASH diet and exercise as tolerated.  °

## 2014-06-08 ENCOUNTER — Other Ambulatory Visit: Payer: Self-pay | Admitting: Family Medicine

## 2014-06-09 ENCOUNTER — Encounter: Payer: Self-pay | Admitting: Family Medicine

## 2014-06-13 ENCOUNTER — Other Ambulatory Visit: Payer: Self-pay | Admitting: Family Medicine

## 2014-07-09 ENCOUNTER — Other Ambulatory Visit: Payer: Self-pay | Admitting: Family Medicine

## 2014-07-09 NOTE — Telephone Encounter (Signed)
Last filled:  01/08/14 Amt: 30, 2 refills Last OV: 05/17/14  Med filled.

## 2014-08-29 ENCOUNTER — Encounter: Payer: Self-pay | Admitting: Family Medicine

## 2014-09-09 ENCOUNTER — Encounter: Payer: Self-pay | Admitting: Family Medicine

## 2014-09-10 ENCOUNTER — Encounter: Payer: Self-pay | Admitting: Physician Assistant

## 2014-09-10 ENCOUNTER — Ambulatory Visit (INDEPENDENT_AMBULATORY_CARE_PROVIDER_SITE_OTHER): Payer: Medicare HMO | Admitting: Physician Assistant

## 2014-09-10 VITALS — BP 130/65 | HR 76 | Temp 97.5°F | Resp 16 | Ht 61.25 in | Wt 189.8 lb

## 2014-09-10 DIAGNOSIS — R399 Unspecified symptoms and signs involving the genitourinary system: Secondary | ICD-10-CM | POA: Diagnosis not present

## 2014-09-10 DIAGNOSIS — R8290 Unspecified abnormal findings in urine: Secondary | ICD-10-CM

## 2014-09-10 DIAGNOSIS — R42 Dizziness and giddiness: Secondary | ICD-10-CM | POA: Diagnosis not present

## 2014-09-10 LAB — POCT URINALYSIS DIPSTICK
BILIRUBIN UA: NEGATIVE
GLUCOSE UA: NEGATIVE
KETONES UA: NEGATIVE
Nitrite, UA: NEGATIVE
Protein, UA: NEGATIVE
RBC UA: NEGATIVE
SPEC GRAV UA: 1.01
UROBILINOGEN UA: 0.2
pH, UA: 6

## 2014-09-10 LAB — GLUCOSE, POCT (MANUAL RESULT ENTRY): POC Glucose: 97 mg/dl (ref 70–99)

## 2014-09-10 MED ORDER — CIPROFLOXACIN HCL 500 MG PO TABS: 500.0000 mg | ORAL_TABLET | Freq: Two times a day (BID) | ORAL | Status: DC

## 2014-09-10 NOTE — Assessment & Plan Note (Signed)
POC glucose within normal limits.  Giving history, suspect vasovagal pre-syncopal episode.  Supportive measures discussed including increase hydration and not skipping meals.

## 2014-09-10 NOTE — Progress Notes (Signed)
Patient presents to clinic today c/o episode of fatigue and dizziness yesterday while singing in a cantata at church.  Ate something and felt slightly better.  Feeling 100% better today.  Last meal was 6:30 AM.  Endorses good food input daily.  Was slightly dehydrated she thinks when symptoms occurred.  Endorses some urinary urgency and frequency but denies fever, chills, pain or hematuria.  Past Medical History  Diagnosis Date  . Thyroid disease   . Hypertension   . Vitamin D deficiency   . Chicken pox as a child  . Measles as a child  . Mumps as a child  . Osteoporosis   . Obesity   . Kidney stone 2000    nephritis as a child  . Allergy   . H/O breast biopsy     benign, on left  . Arthritis     osteoarthritis, severe, b/l knees &b/l hips  replaced  . Skin lesion of face 05/18/2012  . UTI (urinary tract infection) 12/11/2012  . BPV (benign positional vertigo) 02/19/2013    mild  . Viral respiratory illness 05/21/2013  . GERD (gastroesophageal reflux disease) 05/27/2014    Current Outpatient Prescriptions on File Prior to Visit  Medication Sig Dispense Refill  . Ascorbic Acid (VITAMIN C) 1000 MG tablet Take 1,000 mg by mouth daily.    Marland Kitchen aspirin 81 MG tablet Take 81 mg by mouth daily.    . Cholecalciferol (VITAMIN D) 2000 UNITS tablet Take 2,000 Units by mouth daily.    . Coenzyme Q10 (CO Q 10) 100 MG CAPS Take 400 mg by mouth daily.    . furosemide (LASIX) 20 MG tablet Take 1 tablet (20 mg total) by mouth daily as needed. 30 tablet 3  . lisinopril-hydrochlorothiazide (PRINZIDE,ZESTORETIC) 10-12.5 MG per tablet TAKE 1 TABLET BY MOUTH DAILY. 30 tablet 5  . nystatin (MYCOSTATIN/NYSTOP) 100000 UNIT/GM POWD as needed. 1 tsp. Po tid X 7 days    . Omega-3 Fatty Acids (FISH OIL) 1000 MG CPDR Take 1 capsule by mouth daily.    . Probiotic Product (PROBIOTIC DAILY PO) Take by mouth.    . SYNTHROID 150 MCG tablet TAKE 1 TABLET (150 MCG TOTAL) BY MOUTH DAILY BEFORE BREAKFAST. 30 tablet 3    . triamcinolone cream (KENALOG) 0.1 % Apply 1 application topically 2 (two) times daily. 85.2 g 3   No current facility-administered medications on file prior to visit.    Allergies  Allergen Reactions  . Eggs Or Egg-Derived Products   . Keflex [Cephalexin]     Rash   . Sulfa Antibiotics Nausea And Vomiting  . Penicillins Rash  . Poison Ivy Extract [Extract Of Poison Ivy] Rash    Family History  Problem Relation Age of Onset  . Hyperlipidemia Mother   . Hypertension Mother   . Heart attack Mother   . Cancer Father     lung- smoker  . Heart attack Father     X 2  . Thyroid disease Brother   . Hypertension Son   . Cancer Paternal Grandmother     History   Social History  . Marital Status: Unknown    Spouse Name: N/A  . Number of Children: N/A  . Years of Education: N/A   Social History Main Topics  . Smoking status: Never Smoker   . Smokeless tobacco: Never Used  . Alcohol Use: Yes     Comment: Occasional glass of wine  . Drug Use: No  . Sexual Activity: Not on  file   Other Topics Concern  . None   Social History Narrative   Review of Systems - See HPI.  All other ROS are negative.  BP 130/65 mmHg  Pulse 76  Temp(Src) 97.5 F (36.4 C) (Oral)  Resp 16  Ht 5' 1.25" (1.556 m)  Wt 189 lb 12.8 oz (86.093 kg)  BMI 35.56 kg/m2  SpO2 100%  Physical Exam  Constitutional: She is oriented to person, place, and time and well-developed, well-nourished, and in no distress.  HENT:  Head: Normocephalic and atraumatic.  Eyes: Conjunctivae are normal.  Neck: Neck supple.  Cardiovascular: Normal rate, regular rhythm, normal heart sounds and intact distal pulses.   Pulmonary/Chest: Effort normal and breath sounds normal. No respiratory distress. She has no wheezes. She has no rales. She exhibits no tenderness.  Neurological: She is alert and oriented to person, place, and time. No cranial nerve deficit.  Skin: Skin is warm and dry. No rash noted.  Psychiatric:  Affect normal.  Vitals reviewed.  Assessment/Plan: UTI symptoms Urine dip + LE.  Patient symptomatic and with + hx of UTI.  Will empirically treat with Cipro while culture is pending.  Increase fluids.  Daily probiotic and cranberry supplement.   Dizziness POC glucose within normal limits.  Giving history, suspect vasovagal pre-syncopal episode.  Supportive measures discussed including increase hydration and not skipping meals.

## 2014-09-10 NOTE — Patient Instructions (Signed)
Please take the antibiotic as directed. Stay well hydrated. Start a cranberry supplement.  I will call you with your culture results.  For the dizziness -- your exam and blood sugar look great. Stay well hydrated and eat a healthy meal or snack every 3-4 hours.  Call or return to clinic if symptoms recur.  Urinary Tract Infection Urinary tract infections (UTIs) can develop anywhere along your urinary tract. Your urinary tract is your body's drainage system for removing wastes and extra water. Your urinary tract includes two kidneys, two ureters, a bladder, and a urethra. Your kidneys are a pair of bean-shaped organs. Each kidney is about the size of your fist. They are located below your ribs, one on each side of your spine. CAUSES Infections are caused by microbes, which are microscopic organisms, including fungi, viruses, and bacteria. These organisms are so small that they can only be seen through a microscope. Bacteria are the microbes that most commonly cause UTIs. SYMPTOMS  Symptoms of UTIs may vary by age and gender of the patient and by the location of the infection. Symptoms in young women typically include a frequent and intense urge to urinate and a painful, burning feeling in the bladder or urethra during urination. Older women and men are more likely to be tired, shaky, and weak and have muscle aches and abdominal pain. A fever may mean the infection is in your kidneys. Other symptoms of a kidney infection include pain in your back or sides below the ribs, nausea, and vomiting. DIAGNOSIS To diagnose a UTI, your caregiver will ask you about your symptoms. Your caregiver also will ask to provide a urine sample. The urine sample will be tested for bacteria and white blood cells. White blood cells are made by your body to help fight infection. TREATMENT  Typically, UTIs can be treated with medication. Because most UTIs are caused by a bacterial infection, they usually can be treated with  the use of antibiotics. The choice of antibiotic and length of treatment depend on your symptoms and the type of bacteria causing your infection. HOME CARE INSTRUCTIONS  If you were prescribed antibiotics, take them exactly as your caregiver instructs you. Finish the medication even if you feel better after you have only taken some of the medication.  Drink enough water and fluids to keep your urine clear or pale yellow.  Avoid caffeine, tea, and carbonated beverages. They tend to irritate your bladder.  Empty your bladder often. Avoid holding urine for long periods of time.  Empty your bladder before and after sexual intercourse.  After a bowel movement, women should cleanse from front to back. Use each tissue only once. SEEK MEDICAL CARE IF:   You have back pain.  You develop a fever.  Your symptoms do not begin to resolve within 3 days. SEEK IMMEDIATE MEDICAL CARE IF:   You have severe back pain or lower abdominal pain.  You develop chills.  You have nausea or vomiting.  You have continued burning or discomfort with urination. MAKE SURE YOU:   Understand these instructions.  Will watch your condition.  Will get help right away if you are not doing well or get worse. Document Released: 04/01/2005 Document Revised: 12/22/2011 Document Reviewed: 07/31/2011 [facility] Patient Information 2015 [place], [place]. This information is not intended to replace advice given to you by your health care provider. Make sure you discuss any questions you have with your health care provider.

## 2014-09-10 NOTE — Assessment & Plan Note (Signed)
Urine dip + LE.  Patient symptomatic and with + hx of UTI.  Will empirically treat with Cipro while culture is pending.  Increase fluids.  Daily probiotic and cranberry supplement.

## 2014-09-10 NOTE — Progress Notes (Signed)
Pre visit review using our clinic review tool, if applicable. No additional management support is needed unless otherwise documented below in the visit note. 

## 2014-09-12 ENCOUNTER — Telehealth: Payer: Self-pay | Admitting: Physician Assistant

## 2014-09-12 DIAGNOSIS — N390 Urinary tract infection, site not specified: Secondary | ICD-10-CM

## 2014-09-12 LAB — URINE CULTURE: Colony Count: 60000

## 2014-09-12 MED ORDER — NITROFURANTOIN MONOHYD MACRO 100 MG PO CAPS: 100.0000 mg | ORAL_CAPSULE | Freq: Two times a day (BID) | ORAL | Status: DC

## 2014-09-12 NOTE — Telephone Encounter (Signed)
Patient informed, understood & agreed/SLS  

## 2014-09-12 NOTE — Telephone Encounter (Signed)
Culture reveals Group B strep UTI.  Cipro is not the best option.  Unfortunately she is allergic to penicillins which are 1st-line treatment for this.  We will have to start Macrobid twice daily for 5 days.  Rx has been sent to her pharmacy.

## 2014-11-14 ENCOUNTER — Telehealth: Payer: Self-pay | Admitting: *Deleted

## 2014-11-14 NOTE — Telephone Encounter (Signed)
Unable to reach patient at time of Pre-Visit Call.  Left message with family member reminding patient of appointment time and requested call back if available.

## 2014-11-15 ENCOUNTER — Encounter: Payer: Self-pay | Admitting: Family Medicine

## 2014-11-15 ENCOUNTER — Telehealth: Payer: Self-pay | Admitting: Family Medicine

## 2014-11-15 ENCOUNTER — Ambulatory Visit (INDEPENDENT_AMBULATORY_CARE_PROVIDER_SITE_OTHER): Payer: Medicare HMO | Admitting: Family Medicine

## 2014-11-15 VITALS — BP 134/84 | HR 84 | Temp 98.2°F | Ht 62.0 in | Wt 184.1 lb

## 2014-11-15 DIAGNOSIS — E669 Obesity, unspecified: Secondary | ICD-10-CM

## 2014-11-15 DIAGNOSIS — R2681 Unsteadiness on feet: Secondary | ICD-10-CM

## 2014-11-15 DIAGNOSIS — Z0000 Encounter for general adult medical examination without abnormal findings: Secondary | ICD-10-CM | POA: Diagnosis not present

## 2014-11-15 DIAGNOSIS — I10 Essential (primary) hypertension: Secondary | ICD-10-CM | POA: Diagnosis not present

## 2014-11-15 DIAGNOSIS — N390 Urinary tract infection, site not specified: Secondary | ICD-10-CM

## 2014-11-15 DIAGNOSIS — E782 Mixed hyperlipidemia: Secondary | ICD-10-CM

## 2014-11-15 DIAGNOSIS — K219 Gastro-esophageal reflux disease without esophagitis: Secondary | ICD-10-CM

## 2014-11-15 DIAGNOSIS — B372 Candidiasis of skin and nail: Secondary | ICD-10-CM | POA: Diagnosis not present

## 2014-11-15 DIAGNOSIS — E559 Vitamin D deficiency, unspecified: Secondary | ICD-10-CM

## 2014-11-15 DIAGNOSIS — L309 Dermatitis, unspecified: Secondary | ICD-10-CM

## 2014-11-15 DIAGNOSIS — E079 Disorder of thyroid, unspecified: Secondary | ICD-10-CM

## 2014-11-15 LAB — URINALYSIS, ROUTINE W REFLEX MICROSCOPIC
Bilirubin Urine: NEGATIVE
HGB URINE DIPSTICK: NEGATIVE
Ketones, ur: NEGATIVE
NITRITE: NEGATIVE
RBC / HPF: NONE SEEN (ref 0–?)
Specific Gravity, Urine: 1.015 (ref 1.000–1.030)
Total Protein, Urine: NEGATIVE
UROBILINOGEN UA: 0.2 (ref 0.0–1.0)
Urine Glucose: NEGATIVE
pH: 6 (ref 5.0–8.0)

## 2014-11-15 LAB — COMPREHENSIVE METABOLIC PANEL
ALBUMIN: 4 g/dL (ref 3.5–5.2)
ALT: 13 U/L (ref 0–35)
AST: 15 U/L (ref 0–37)
Alkaline Phosphatase: 70 U/L (ref 39–117)
BUN: 16 mg/dL (ref 6–23)
CO2: 29 mEq/L (ref 19–32)
CREATININE: 0.82 mg/dL (ref 0.40–1.20)
Calcium: 9.6 mg/dL (ref 8.4–10.5)
Chloride: 105 mEq/L (ref 96–112)
GFR: 72.48 mL/min (ref >60.00)
Glucose, Bld: 87 mg/dL (ref 70–99)
POTASSIUM: 3.8 meq/L (ref 3.5–5.1)
Sodium: 139 mEq/L (ref 135–145)
Total Bilirubin: 0.4 mg/dL (ref 0.2–1.2)
Total Protein: 7.2 g/dL (ref 6.0–8.3)

## 2014-11-15 LAB — CBC
HCT: 43.7 % (ref 36.0–46.0)
HEMOGLOBIN: 15 g/dL (ref 12.0–15.0)
MCHC: 34.5 g/dL (ref 30.0–36.0)
MCV: 86.7 fl (ref 78.0–100.0)
Platelets: 275 10*3/uL (ref 150.0–400.0)
RBC: 5.04 Mil/uL (ref 3.87–5.11)
RDW: 14.2 % (ref 11.5–15.5)
WBC: 6.5 10*3/uL (ref 4.0–10.5)

## 2014-11-15 LAB — TSH: TSH: 0.2 u[IU]/mL — AB (ref 0.35–4.50)

## 2014-11-15 LAB — VITAMIN D 25 HYDROXY (VIT D DEFICIENCY, FRACTURES): VITD: 39.93 ng/mL (ref 30.00–100.00)

## 2014-11-15 MED ORDER — LISINOPRIL-HYDROCHLOROTHIAZIDE 10-12.5 MG PO TABS: 1.0000 | ORAL_TABLET | Freq: Every day | ORAL | Status: DC

## 2014-11-15 MED ORDER — SYNTHROID 150 MCG PO TABS: ORAL_TABLET | ORAL | Status: DC

## 2014-11-15 MED ORDER — TRIAMCINOLONE ACETONIDE 0.1 % EX CREA: 1.0000 "application " | TOPICAL_CREAM | Freq: Two times a day (BID) | CUTANEOUS | Status: DC

## 2014-11-15 MED ORDER — NYSTATIN 100000 UNIT/GM EX POWD
CUTANEOUS | Status: DC
Start: 2014-11-15 — End: 2016-10-19

## 2014-11-15 NOTE — Telephone Encounter (Signed)
Called the pharmacist at [facility] per PCP instructions.  Corrected instructions to apply as directed as this is to be prescribed as powder not suspension.  Pharmacist informed

## 2014-11-15 NOTE — Patient Instructions (Signed)
[place], 10 strain 1 capsule daily, can order online at [email or web address]   Preventive Care for Adults A healthy lifestyle and preventive care can promote health and wellness. Preventive health guidelines for women include the following key practices.  A routine yearly physical is a good way to check with your health care provider about your health and preventive screening. It is a chance to share any concerns and updates on your health and to receive a thorough exam.  Visit your dentist for a routine exam and preventive care every 6 months. Brush your teeth twice a day and floss once a day. Good oral hygiene prevents tooth decay and gum disease.  The frequency of eye exams is based on your age, health, family medical history, use of contact lenses, and other factors. Follow your health care provider's recommendations for frequency of eye exams.  Eat a healthy diet. Foods like vegetables, fruits, whole grains, low-fat dairy products, and lean protein foods contain the nutrients you need without too many calories. Decrease your intake of foods high in solid fats, added sugars, and salt. Eat the right amount of calories for you.Get information about a proper diet from your health care provider, if necessary.  Regular physical exercise is one of the most important things you can do for your health. Most adults should get at least 150 minutes of moderate-intensity exercise (any activity that increases your heart rate and causes you to sweat) each week. In addition, most adults need muscle-strengthening exercises on 2 or more days a week.  Maintain a healthy weight. The body mass index (BMI) is a screening tool to identify possible weight problems. It provides an estimate of body fat based on height and weight. Your health care provider can find your BMI and can help you achieve or maintain a healthy weight.For adults 20 years and older:  A BMI below 18.5 is considered underweight.  A BMI of  18.5 to 24.9 is normal.  A BMI of 25 to 29.9 is considered overweight.  A BMI of 30 and above is considered obese.  Maintain normal blood lipids and cholesterol levels by exercising and minimizing your intake of saturated fat. Eat a balanced diet with plenty of fruit and vegetables. Blood tests for lipids and cholesterol should begin at age 51 and be repeated every 5 years. If your lipid or cholesterol levels are high, you are over 50, or you are at high risk for heart disease, you may need your cholesterol levels checked more frequently.Ongoing high lipid and cholesterol levels should be treated with medicines if diet and exercise are not working.  If you smoke, find out from your health care provider how to quit. If you do not use tobacco, do not start.  Lung cancer screening is recommended for adults aged 64-80 years who are at high risk for developing lung cancer because of a history of smoking. A yearly low-dose CT scan of the lungs is recommended for people who have at least a 30-pack-year history of smoking and are a current smoker or have quit within the past 15 years. A pack year of smoking is smoking an average of 1 pack of cigarettes a day for 1 year (for example: 1 pack a day for 30 years or 2 packs a day for 15 years). Yearly screening should continue until the smoker has stopped smoking for at least 15 years. Yearly screening should be stopped for people who develop a health problem that would prevent them from having lung cancer  treatment.  If you are pregnant, do not drink alcohol. If you are breastfeeding, be very cautious about drinking alcohol. If you are not pregnant and choose to drink alcohol, do not have more than 1 drink per day. One drink is considered to be 12 ounces (355 mL) of beer, 5 ounces (148 mL) of wine, or 1.5 ounces (44 mL) of liquor.  Avoid use of street drugs. Do not share needles with anyone. Ask for help if you need support or instructions about stopping the use  of drugs.  High blood pressure causes heart disease and increases the risk of stroke. Your blood pressure should be checked at least every 1 to 2 years. Ongoing high blood pressure should be treated with medicines if weight loss and exercise do not work.  If you are 77-90 years old, ask your health care provider if you should take aspirin to prevent strokes.  Diabetes screening involves taking a blood sample to check your fasting blood sugar level. This should be done once every 3 years, after age 29, if you are within normal weight and without risk factors for diabetes. Testing should be considered at a younger age or be carried out more frequently if you are overweight and have at least 1 risk factor for diabetes.  Breast cancer screening is essential preventive care for women. You should practice "breast self-awareness." This means understanding the normal appearance and feel of your breasts and may include breast self-examination. Any changes detected, no matter how small, should be reported to a health care provider. Women in their 79s and 30s should have a clinical breast exam (CBE) by a health care provider as part of a regular health exam every 1 to 3 years. After age 62, women should have a CBE every year. Starting at age 29, women should consider having a mammogram (breast X-ray test) every year. Women who have a family history of breast cancer should talk to their health care provider about genetic screening. Women at a high risk of breast cancer should talk to their health care providers about having an MRI and a mammogram every year.  Breast cancer gene (BRCA)-related cancer risk assessment is recommended for women who have family members with BRCA-related cancers. BRCA-related cancers include breast, ovarian, tubal, and peritoneal cancers. Having family members with these cancers may be associated with an increased risk for harmful changes (mutations) in the breast cancer genes BRCA1 and  BRCA2. Results of the assessment will determine the need for genetic counseling and BRCA1 and BRCA2 testing.  Routine pelvic exams to screen for cancer are no longer recommended for nonpregnant women who are considered low risk for cancer of the pelvic organs (ovaries, uterus, and vagina) and who do not have symptoms. Ask your health care provider if a screening pelvic exam is right for you.  If you have had past treatment for cervical cancer or a condition that could lead to cancer, you need Pap tests and screening for cancer for at least 20 years after your treatment. If Pap tests have been discontinued, your risk factors (such as having a new sexual partner) need to be reassessed to determine if screening should be resumed. Some women have medical problems that increase the chance of getting cervical cancer. In these cases, your health care provider may recommend more frequent screening and Pap tests.  The HPV test is an additional test that may be used for cervical cancer screening. The HPV test looks for the virus that can cause the  cell changes on the cervix. The cells collected during the Pap test can be tested for HPV. The HPV test could be used to screen women aged 57 years and older, and should be used in women of any age who have unclear Pap test results. After the age of 74, women should have HPV testing at the same frequency as a Pap test.  Colorectal cancer can be detected and often prevented. Most routine colorectal cancer screening begins at the age of 51 years and continues through age 69 years. However, your health care provider may recommend screening at an earlier age if you have risk factors for colon cancer. On a yearly basis, your health care provider may provide home test kits to check for hidden blood in the stool. Use of a small camera at the end of a tube, to directly examine the colon (sigmoidoscopy or colonoscopy), can detect the earliest forms of colorectal cancer. Talk to your  health care provider about this at age 45, when routine screening begins. Direct exam of the colon should be repeated every 5-10 years through age 76 years, unless early forms of pre-cancerous polyps or small growths are found.  People who are at an increased risk for hepatitis B should be screened for this virus. You are considered at high risk for hepatitis B if:  You were born in a country where hepatitis B occurs often. Talk with your health care provider about which countries are considered high risk.  Your parents were born in a high-risk country and you have not received a shot to protect against hepatitis B (hepatitis B vaccine).  You have HIV or AIDS.  You use needles to inject street drugs.  You live with, or have sex with, someone who has hepatitis B.  You get hemodialysis treatment.  You take certain medicines for conditions like cancer, organ transplantation, and autoimmune conditions.  Hepatitis C blood testing is recommended for all people born from [birth year] through 1965 and any individual with known risks for hepatitis C.  Practice safe sex. Use condoms and avoid high-risk sexual practices to reduce the spread of sexually transmitted infections (STIs). STIs include gonorrhea, chlamydia, syphilis, trichomonas, herpes, HPV, and human immunodeficiency virus (HIV). Herpes, HIV, and HPV are viral illnesses that have no cure. They can result in disability, cancer, and death.  You should be screened for sexually transmitted illnesses (STIs) including gonorrhea and chlamydia if:  You are sexually active and are younger than 24 years.  You are older than 24 years and your health care provider tells you that you are at risk for this type of infection.  Your sexual activity has changed since you were last screened and you are at an increased risk for chlamydia or gonorrhea. Ask your health care provider if you are at risk.  If you are at risk of being infected with HIV, it is  recommended that you take a prescription medicine daily to prevent HIV infection. This is called preexposure prophylaxis (PrEP). You are considered at risk if:  You are a heterosexual woman, are sexually active, and are at increased risk for HIV infection.  You take drugs by injection.  You are sexually active with a partner who has HIV.  Talk with your health care provider about whether you are at high risk of being infected with HIV. If you choose to begin PrEP, you should first be tested for HIV. You should then be tested every 3 months for as long as you are taking  PrEP.  Osteoporosis is a disease in which the bones lose minerals and strength with aging. This can result in serious bone fractures or breaks. The risk of osteoporosis can be identified using a bone density scan. Women ages 11 years and over and women at risk for fractures or osteoporosis should discuss screening with their health care providers. Ask your health care provider whether you should take a calcium supplement or vitamin D to reduce the rate of osteoporosis.  Menopause can be associated with physical symptoms and risks. Hormone replacement therapy is available to decrease symptoms and risks. You should talk to your health care provider about whether hormone replacement therapy is right for you.  Use sunscreen. Apply sunscreen liberally and repeatedly throughout the day. You should seek shade when your shadow is shorter than you. Protect yourself by wearing long sleeves, pants, a wide-brimmed hat, and sunglasses year round, whenever you are outdoors.  Once a month, do a whole body skin exam, using a mirror to look at the skin on your back. Tell your health care provider of new moles, moles that have irregular borders, moles that are larger than a pencil eraser, or moles that have changed in shape or color.  Stay current with required vaccines (immunizations).  Influenza vaccine. All adults should be immunized every  year.  Tetanus, diphtheria, and acellular pertussis (Td, Tdap) vaccine. Pregnant women should receive 1 dose of Tdap vaccine during each pregnancy. The dose should be obtained regardless of the length of time since the last dose. Immunization is preferred during the 27th-36th week of gestation. An adult who has not previously received Tdap or who does not know her vaccine status should receive 1 dose of Tdap. This initial dose should be followed by tetanus and diphtheria toxoids (Td) booster doses every 10 years. Adults with an unknown or incomplete history of completing a 3-dose immunization series with Td-containing vaccines should begin or complete a primary immunization series including a Tdap dose. Adults should receive a Td booster every 10 years.  Varicella vaccine. An adult without evidence of immunity to varicella should receive 2 doses or a second dose if she has previously received 1 dose. Pregnant females who do not have evidence of immunity should receive the first dose after pregnancy. This first dose should be obtained before leaving the health care facility. The second dose should be obtained 4-8 weeks after the first dose.  Human papillomavirus (HPV) vaccine. Females aged 13-26 years who have not received the vaccine previously should obtain the 3-dose series. The vaccine is not recommended for use in pregnant females. However, pregnancy testing is not needed before receiving a dose. If a female is found to be pregnant after receiving a dose, no treatment is needed. In that case, the remaining doses should be delayed until after the pregnancy. Immunization is recommended for any person with an immunocompromised condition through the age of 38 years if she did not get any or all doses earlier. During the 3-dose series, the second dose should be obtained 4-8 weeks after the first dose. The third dose should be obtained 24 weeks after the first dose and 16 weeks after the second dose.  Zoster  vaccine. One dose is recommended for adults aged 79 years or older unless certain conditions are present.  Measles, mumps, and rubella (MMR) vaccine. Adults born before [birth year] generally are considered immune to measles and mumps. Adults born in [birth year] or later should have 1 or more doses of MMR vaccine unless there  is a contraindication to the vaccine or there is laboratory evidence of immunity to each of the three diseases. A routine second dose of MMR vaccine should be obtained at least 28 days after the first dose for students attending postsecondary schools, health care workers, or international travelers. People who received inactivated measles vaccine or an unknown type of measles vaccine during 1963-1967 should receive 2 doses of MMR vaccine. People who received inactivated mumps vaccine or an unknown type of mumps vaccine before 1979 and are at high risk for mumps infection should consider immunization with 2 doses of MMR vaccine. For females of childbearing age, rubella immunity should be determined. If there is no evidence of immunity, females who are not pregnant should be vaccinated. If there is no evidence of immunity, females who are pregnant should delay immunization until after pregnancy. Unvaccinated health care workers born before [birth year] who lack laboratory evidence of measles, mumps, or rubella immunity or laboratory confirmation of disease should consider measles and mumps immunization with 2 doses of MMR vaccine or rubella immunization with 1 dose of MMR vaccine.  Pneumococcal 13-valent conjugate (PCV13) vaccine. When indicated, a person who is uncertain of her immunization history and has no record of immunization should receive the PCV13 vaccine. An adult aged 20 years or older who has certain medical conditions and has not been previously immunized should receive 1 dose of PCV13 vaccine. This PCV13 should be followed with a dose of pneumococcal polysaccharide (PPSV23) vaccine. The PPSV23  vaccine dose should be obtained at least 8 weeks after the dose of PCV13 vaccine. An adult aged 42 years or older who has certain medical conditions and previously received 1 or more doses of PPSV23 vaccine should receive 1 dose of PCV13. The PCV13 vaccine dose should be obtained 1 or more years after the last PPSV23 vaccine dose.  Pneumococcal polysaccharide (PPSV23) vaccine. When PCV13 is also indicated, PCV13 should be obtained first. All adults aged 33 years and older should be immunized. An adult younger than age 23 years who has certain medical conditions should be immunized. Any person who resides in a nursing home or long-term care facility should be immunized. An adult smoker should be immunized. People with an immunocompromised condition and certain other conditions should receive both PCV13 and PPSV23 vaccines. People with human immunodeficiency virus (HIV) infection should be immunized as soon as possible after diagnosis. Immunization during chemotherapy or radiation therapy should be avoided. Routine use of PPSV23 vaccine is not recommended for American Indians, [place] Natives, or people younger than 65 years unless there are medical conditions that require PPSV23 vaccine. When indicated, people who have unknown immunization and have no record of immunization should receive PPSV23 vaccine. One-time revaccination 5 years after the first dose of PPSV23 is recommended for people aged 19-64 years who have chronic kidney failure, nephrotic syndrome, asplenia, or immunocompromised conditions. People who received 1-2 doses of PPSV23 before age 61 years should receive another dose of PPSV23 vaccine at age 14 years or later if at least 5 years have passed since the previous dose. Doses of PPSV23 are not needed for people immunized with PPSV23 at or after age 66 years.  Meningococcal vaccine. Adults with asplenia or persistent complement component deficiencies should receive 2 doses of quadrivalent  meningococcal conjugate (MenACWY-D) vaccine. The doses should be obtained at least 2 months apart. Microbiologists working with certain meningococcal bacteria, [employer] recruits, people at risk during an outbreak, and people who travel to or live in countries with a  high rate of meningitis should be immunized. A first-year college student up through age 46 years who is living in a residence hall should receive a dose if she did not receive a dose on or after her 16th birthday. Adults who have certain high-risk conditions should receive one or more doses of vaccine.  Hepatitis A vaccine. Adults who wish to be protected from this disease, have certain high-risk conditions, work with hepatitis A-infected animals, work in hepatitis A research labs, or travel to or work in countries with a high rate of hepatitis A should be immunized. Adults who were previously unvaccinated and who anticipate close contact with an international adoptee during the first 60 days after arrival in the [place] from a country with a high rate of hepatitis A should be immunized.  Hepatitis B vaccine. Adults who wish to be protected from this disease, have certain high-risk conditions, may be exposed to blood or other infectious body fluids, are household contacts or sex partners of hepatitis B positive people, are clients or workers in certain care facilities, or travel to or work in countries with a high rate of hepatitis B should be immunized.  Haemophilus influenzae type b (Hib) vaccine. A previously unvaccinated person with asplenia or sickle cell disease or having a scheduled splenectomy should receive 1 dose of Hib vaccine. Regardless of previous immunization, a recipient of a hematopoietic stem cell transplant should receive a 3-dose series 6-12 months after her successful transplant. Hib vaccine is not recommended for adults with HIV infection. Preventive Services / Frequency Ages 65 to 73 years  Blood pressure check.**  / Every 1 to 2 years.  Lipid and cholesterol check.** / Every 5 years beginning at age 16.  Clinical breast exam.** / Every 3 years for women in their 46s and 42s.  BRCA-related cancer risk assessment.** / For women who have family members with a BRCA-related cancer (breast, ovarian, tubal, or peritoneal cancers).  Pap test.** / Every 2 years from ages 75 through 54. Every 3 years starting at age 60 through age 41 or 50 with a history of 3 consecutive normal Pap tests.  HPV screening.** / Every 3 years from ages 42 through ages 75 to 45 with a history of 3 consecutive normal Pap tests.  Hepatitis C blood test.** / For any individual with known risks for hepatitis C.  Skin self-exam. / Monthly.  Influenza vaccine. / Every year.  Tetanus, diphtheria, and acellular pertussis (Tdap, Td) vaccine.** / Consult your health care provider. Pregnant women should receive 1 dose of Tdap vaccine during each pregnancy. 1 dose of Td every 10 years.  Varicella vaccine.** / Consult your health care provider. Pregnant females who do not have evidence of immunity should receive the first dose after pregnancy.  HPV vaccine. / 3 doses over 6 months, if 58 and younger. The vaccine is not recommended for use in pregnant females. However, pregnancy testing is not needed before receiving a dose.  Measles, mumps, rubella (MMR) vaccine.** / You need at least 1 dose of MMR if you were born in 1957 or later. You may also need a 2nd dose. For females of childbearing age, rubella immunity should be determined. If there is no evidence of immunity, females who are not pregnant should be vaccinated. If there is no evidence of immunity, females who are pregnant should delay immunization until after pregnancy.  Pneumococcal 13-valent conjugate (PCV13) vaccine.** / Consult your health care provider.  Pneumococcal polysaccharide (PPSV23) vaccine.** / 1 to 2  doses if you smoke cigarettes or if you have certain  conditions.  Meningococcal vaccine.** / 1 dose if you are age 88 to 25 years and a Market researcher living in a residence hall, or have one of several medical conditions, you need to get vaccinated against meningococcal disease. You may also need additional booster doses.  Hepatitis A vaccine.** / Consult your health care provider.  Hepatitis B vaccine.** / Consult your health care provider.  Haemophilus influenzae type b (Hib) vaccine.** / Consult your health care provider. Ages 55 to 108 years  Blood pressure check.** / Every 1 to 2 years.  Lipid and cholesterol check.** / Every 5 years beginning at age 62 years.  Lung cancer screening. / Every year if you are aged 63-80 years and have a 30-pack-year history of smoking and currently smoke or have quit within the past 15 years. Yearly screening is stopped once you have quit smoking for at least 15 years or develop a health problem that would prevent you from having lung cancer treatment.  Clinical breast exam.** / Every year after age 52 years.  BRCA-related cancer risk assessment.** / For women who have family members with a BRCA-related cancer (breast, ovarian, tubal, or peritoneal cancers).  Mammogram.** / Every year beginning at age 62 years and continuing for as long as you are in good health. Consult with your health care provider.  Pap test.** / Every 3 years starting at age 47 years through age 61 or 65 years with a history of 3 consecutive normal Pap tests.  HPV screening.** / Every 3 years from ages 96 years through ages 9 to 43 years with a history of 3 consecutive normal Pap tests.  Fecal occult blood test (FOBT) of stool. / Every year beginning at age 41 years and continuing until age 59 years. You may not need to do this test if you get a colonoscopy every 10 years.  Flexible sigmoidoscopy or colonoscopy.** / Every 5 years for a flexible sigmoidoscopy or every 10 years for a colonoscopy beginning at age 74 years  and continuing until age 55 years.  Hepatitis C blood test.** / For all people born from [birth year] through 1965 and any individual with known risks for hepatitis C.  Skin self-exam. / Monthly.  Influenza vaccine. / Every year.  Tetanus, diphtheria, and acellular pertussis (Tdap/Td) vaccine.** / Consult your health care provider. Pregnant women should receive 1 dose of Tdap vaccine during each pregnancy. 1 dose of Td every 10 years.  Varicella vaccine.** / Consult your health care provider. Pregnant females who do not have evidence of immunity should receive the first dose after pregnancy.  Zoster vaccine.** / 1 dose for adults aged 21 years or older.  Measles, mumps, rubella (MMR) vaccine.** / You need at least 1 dose of MMR if you were born in 1957 or later. You may also need a 2nd dose. For females of childbearing age, rubella immunity should be determined. If there is no evidence of immunity, females who are not pregnant should be vaccinated. If there is no evidence of immunity, females who are pregnant should delay immunization until after pregnancy.  Pneumococcal 13-valent conjugate (PCV13) vaccine.** / Consult your health care provider.  Pneumococcal polysaccharide (PPSV23) vaccine.** / 1 to 2 doses if you smoke cigarettes or if you have certain conditions.  Meningococcal vaccine.** / Consult your health care provider.  Hepatitis A vaccine.** / Consult your health care provider.  Hepatitis B vaccine.** / Consult your health care provider.  Haemophilus influenzae type b (Hib) vaccine.** / Consult your health care provider. Ages 64 years and over  Blood pressure check.** / Every 1 to 2 years.  Lipid and cholesterol check.** / Every 5 years beginning at age 21 years.  Lung cancer screening. / Every year if you are aged 51-80 years and have a 30-pack-year history of smoking and currently smoke or have quit within the past 15 years. Yearly screening is stopped once you have quit smoking  for at least 15 years or develop a health problem that would prevent you from having lung cancer treatment.  Clinical breast exam.** / Every year after age 25 years.  BRCA-related cancer risk assessment.** / For women who have family members with a BRCA-related cancer (breast, ovarian, tubal, or peritoneal cancers).  Mammogram.** / Every year beginning at age 71 years and continuing for as long as you are in good health. Consult with your health care provider.  Pap test.** / Every 3 years starting at age 76 years through age 61 or 47 years with 3 consecutive normal Pap tests. Testing can be stopped between 65 and 70 years with 3 consecutive normal Pap tests and no abnormal Pap or HPV tests in the past 10 years.  HPV screening.** / Every 3 years from ages 67 years through ages 45 or 54 years with a history of 3 consecutive normal Pap tests. Testing can be stopped between 65 and 70 years with 3 consecutive normal Pap tests and no abnormal Pap or HPV tests in the past 10 years.  Fecal occult blood test (FOBT) of stool. / Every year beginning at age 88 years and continuing until age 63 years. You may not need to do this test if you get a colonoscopy every 10 years.  Flexible sigmoidoscopy or colonoscopy.** / Every 5 years for a flexible sigmoidoscopy or every 10 years for a colonoscopy beginning at age 70 years and continuing until age 30 years.  Hepatitis C blood test.** / For all people born from [birth year] through 1965 and any individual with known risks for hepatitis C.  Osteoporosis screening.** / A one-time screening for women ages 58 years and over and women at risk for fractures or osteoporosis.  Skin self-exam. / Monthly.  Influenza vaccine. / Every year.  Tetanus, diphtheria, and acellular pertussis (Tdap/Td) vaccine.** / 1 dose of Td every 10 years.  Varicella vaccine.** / Consult your health care provider.  Zoster vaccine.** / 1 dose for adults aged 55 years or older.  Pneumococcal  13-valent conjugate (PCV13) vaccine.** / Consult your health care provider.  Pneumococcal polysaccharide (PPSV23) vaccine.** / 1 dose for all adults aged 32 years and older.  Meningococcal vaccine.** / Consult your health care provider.  Hepatitis A vaccine.** / Consult your health care provider.  Hepatitis B vaccine.** / Consult your health care provider.  Haemophilus influenzae type b (Hib) vaccine.** / Consult your health care provider. ** Family history and personal history of risk and conditions may change your health care provider's recommendations. Document Released: 08/18/2001 Document Revised: 11/06/2013 Document Reviewed: 11/17/2010 [facility] Patient Information 2015 [place], [place]. This information is not intended to replace advice given to you by your health care provider. Make sure you discuss any questions you have with your health care provider.

## 2014-11-15 NOTE — Progress Notes (Signed)
Pre visit review using our clinic review tool, if applicable. No additional management support is needed unless otherwise documented below in the visit note. 

## 2014-11-15 NOTE — Telephone Encounter (Signed)
Advise on Nystatin powder sent in today stated to take by mouth.  Was this to be suspension.  [facility] needs clarification.

## 2014-11-17 LAB — URINE CULTURE: Colony Count: >=100000

## 2014-11-19 ENCOUNTER — Other Ambulatory Visit: Payer: Self-pay | Admitting: Family Medicine

## 2014-11-25 ENCOUNTER — Encounter: Payer: Self-pay | Admitting: Family Medicine

## 2014-11-25 DIAGNOSIS — R2681 Unsteadiness on feet: Secondary | ICD-10-CM | POA: Insufficient documentation

## 2014-11-25 DIAGNOSIS — Z0000 Encounter for general adult medical examination without abnormal findings: Secondary | ICD-10-CM

## 2014-11-25 HISTORY — DX: Encounter for general adult medical examination without abnormal findings: Z00.00

## 2014-11-25 HISTORY — DX: Unsteadiness on feet: R26.81

## 2014-11-25 NOTE — Assessment & Plan Note (Signed)
Referred to PT for further treatment she is encouraged to stay as active as possible

## 2014-11-25 NOTE — Assessment & Plan Note (Signed)
Patient denies any difficulties at home. No trouble with ADLs, depression or falls. No recent changes to vision or hearing. Is UTD with immunizations. Is UTD with screening. Discussed Advanced Directives, patient agrees to bring [place] copies of documents if can. Encouraged heart healthy diet, exercise as tolerated and adequate sleep. Labs ordered and reviewed.  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  Last pap fall of 2012, normal Last Pneumovax in past 2 years.  Took Zostavax Sees [place] [facility] and Dr Annamary Carolin for Mohs All other specialists are still located in [place]. See problem list for risk factors See AVS for preventative health reocmmendations

## 2014-11-25 NOTE — Assessment & Plan Note (Signed)
Avoid offending foods, start probiotics. Do not eat large meals in late evening and consider raising head of bed.  

## 2014-11-25 NOTE — Assessment & Plan Note (Addendum)
Recurrent candidal infections on skin, given refills on Diflucan and Triamcinolone and Nystatin cream

## 2014-11-25 NOTE — Progress Notes (Signed)
Felicia Lowe  [id number] [birth year] 11/25/2014      Progress Note-Follow Up  Subjective  Chief Complaint  Chief Complaint  Patient presents with  . Annual Exam    HPI  Patient is a 74 y.o. female in today for routine medical care. Patient is in today for annual exam. Generally doing well but acknowledges she has difficulty with unsteady gait and weakness. Is concerned about falls. Has not had any falls. So far. Is trying hard to eat a heart healthy diet and has lost about 5 pounds. Has had a recent flare in her allergies with Claritin has been helpful. Is noting some increased skin irritation and return if her yeast. Has some vaginal irritation as well. No recent fever or chills. Is not having any difficulties with her activities of daily living. Continues to see some subspecialists in [place]. Denies CP/palp/SOB/HA/congestion/fevers/GI or GU c/o. Taking meds as prescribed  Past Medical History  Diagnosis Date  . Thyroid disease   . Hypertension   . Vitamin D deficiency   . Chicken pox as a child  . Measles as a child  . Mumps as a child  . Osteoporosis   . Obesity   . Kidney stone 2000    nephritis as a child  . Allergy   . H/O breast biopsy     benign, on left  . Arthritis     osteoarthritis, severe, b/l knees &b/l hips  replaced  . Skin lesion of face 05/18/2012  . UTI (urinary tract infection) 12/11/2012  . BPV (benign positional vertigo) 02/19/2013    mild  . Viral respiratory illness 05/21/2013  . GERD (gastroesophageal reflux disease) 05/27/2014  . Preventative health care 11/25/2014    Past Surgical History  Procedure Laterality Date  . Knee surgery      both knees replaced  . Hip surgery      both hips    Family History  Problem Relation Age of Onset  . Hyperlipidemia Mother   . Hypertension Mother   . Heart attack Mother   . Cancer Father     lung- smoker  . Heart attack Father     X 2  . Thyroid disease Brother   . Hypertension Son   . Cancer  Paternal Grandmother     History   Social History  . Marital Status: Unknown    Spouse Name: N/A  . Number of Children: N/A  . Years of Education: N/A   Occupational History  . Not on file.   Social History Main Topics  . Smoking status: Never Smoker   . Smokeless tobacco: Never Used  . Alcohol Use: Yes     Comment: Occasional glass of wine  . Drug Use: No  . Sexual Activity: No   Other Topics Concern  . Not on file   Social History Narrative    Current Outpatient Prescriptions on File Prior to Visit  Medication Sig Dispense Refill  . Ascorbic Acid (VITAMIN C) 1000 MG tablet Take 1,000 mg by mouth daily.    Marland Kitchen aspirin 81 MG tablet Take 81 mg by mouth daily.    . Cholecalciferol (VITAMIN D) 2000 UNITS tablet Take 2,000 Units by mouth daily.    . Coenzyme Q10 (CO Q 10) 100 MG CAPS Take 400 mg by mouth daily.    . Omega-3 Fatty Acids (FISH OIL) 1000 MG CPDR Take 1 capsule by mouth daily.    . Probiotic Product (PROBIOTIC DAILY PO) Take by mouth.    Marland Kitchen  furosemide (LASIX) 20 MG tablet Take 1 tablet (20 mg total) by mouth daily as needed. (Patient not taking: Reported on 11/15/2014) 30 tablet 3  . nitrofurantoin, macrocrystal-monohydrate, (MACROBID) 100 MG capsule Take 1 capsule (100 mg total) by mouth 2 (two) times daily. (Patient not taking: Reported on 11/15/2014) 10 capsule 0   No current facility-administered medications on file prior to visit.    Allergies  Allergen Reactions  . Eggs Or Egg-Derived Products   . Keflex [Cephalexin]     Rash   . Sulfa Antibiotics Nausea And Vomiting  . Penicillins Rash  . Poison Ivy Extract [Extract Of Poison Ivy] Rash    Review of Systems  Review of Systems  Constitutional: Positive for malaise/fatigue. Negative for fever and chills.  HENT: Negative for congestion, hearing loss and nosebleeds.   Eyes: Negative for discharge.  Respiratory: Negative for cough, sputum production, shortness of breath and wheezing.   Cardiovascular:  Negative for chest pain, palpitations and leg swelling.  Gastrointestinal: Negative for heartburn, nausea, vomiting, abdominal pain, diarrhea, constipation and blood in stool.  Genitourinary: Negative for dysuria, urgency, frequency and hematuria.  Musculoskeletal: Positive for joint pain. Negative for myalgias, back pain and falls.  Skin: Positive for itching and rash.  Neurological: Negative for dizziness, tremors, sensory change, focal weakness, loss of consciousness, weakness and headaches.  Endo/Heme/Allergies: Negative for polydipsia. Does not bruise/bleed easily.  Psychiatric/Behavioral: Negative for depression and suicidal ideas. The patient is not nervous/anxious and does not have insomnia.     Objective  BP 134/84 mmHg  Pulse 84  Temp(Src) 98.2 F (36.8 C) (Oral)  Ht 5\' 2"  (1.575 m)  Wt 184 lb 2 oz (83.519 kg)  BMI 33.67 kg/m2  SpO2 96%  Physical Exam  Physical Exam  Constitutional: She is oriented to person, place, and time and well-developed, well-nourished, and in no distress. No distress.  HENT:  Head: Normocephalic and atraumatic.  Right Ear: External ear normal.  Left Ear: External ear normal.  Nose: Nose normal.  Mouth/Throat: Oropharynx is clear and moist. No oropharyngeal exudate.  Eyes: Conjunctivae are normal. Pupils are equal, round, and reactive to light. Right eye exhibits no discharge. Left eye exhibits no discharge. No scleral icterus.  Neck: Normal range of motion. Neck supple. No thyromegaly present.  Cardiovascular: Normal rate, regular rhythm, normal heart sounds and intact distal pulses.   No murmur heard. Pulmonary/Chest: Effort normal and breath sounds normal. No respiratory distress. She has no wheezes. She has no rales.  Abdominal: Soft. Bowel sounds are normal. She exhibits no distension and no mass. There is no tenderness.  Musculoskeletal: Normal range of motion. She exhibits no edema or tenderness.  Lymphadenopathy:    She has no cervical  adenopathy.  Neurological: She is alert and oriented to person, place, and time. She has normal reflexes. No cranial nerve deficit. Coordination normal.  Skin: Skin is warm and dry. No rash noted. She is not diaphoretic.  Psychiatric: Mood, memory and affect normal.    Lab Results  Component Value Date   TSH 0.20* 11/15/2014   Lab Results  Component Value Date   WBC 6.5 11/15/2014   HGB 15.0 11/15/2014   HCT 43.7 11/15/2014   MCV 86.7 11/15/2014   PLT 275.0 11/15/2014   Lab Results  Component Value Date   CREATININE 0.82 11/15/2014   BUN 16 11/15/2014   NA 139 11/15/2014   K 3.8 11/15/2014   CL 105 11/15/2014   CO2 29 11/15/2014   Lab Results  Component Value Date   ALT 13 11/15/2014   AST 15 11/15/2014   ALKPHOS 70 11/15/2014   BILITOT 0.4 11/15/2014   Lab Results  Component Value Date   CHOL 145 02/16/2013   Lab Results  Component Value Date   HDL 46 02/16/2013   Lab Results  Component Value Date   LDLCALC 76 02/16/2013   Lab Results  Component Value Date   TRIG 117 02/16/2013   Lab Results  Component Value Date   CHOLHDL 3.2 02/16/2013     Assessment & Plan Hypertension Well controlled, no changes to meds. Encouraged heart healthy diet such as the DASH diet and exercise as tolerated.    GERD (gastroesophageal reflux disease) Avoid offending foods, start probiotics. Do not eat large meals in late evening and consider raising head of bed.    UTI (urinary tract infection) macrobid bid and probiotics   Obesity Encouraged DASH diet, decrease po intake and increase exercise as tolerated. Needs 7-8 hours of sleep nightly. Avoid trans fats, eat small, frequent meals every 4-5 hours with lean proteins, complex carbs and healthy fats. Minimize simple carbs   Vitamin d deficiency Continue vitamin D supplements and recheck levels in 3 months   Dermatitis Recurrent candidal infections on skin, given refills on Diflucan and Triamcinolone and Nystatin  cream   Thyroid disease TSH mildly suppressed but patient asymptomatic, no changes will recheck TSH in 12 weeks   Medicare annual wellness visit, subsequent Patient denies any difficulties at home. No trouble with ADLs, depression or falls. No recent changes to vision or hearing. Is UTD with immunizations. Is UTD with screening. Discussed Advanced Directives, patient agrees to bring [place] copies of documents if can. Encouraged heart healthy diet, exercise as tolerated and adequate sleep. Labs ordered and reviewed.  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  Last pap fall of 2012, normal Last Pneumovax in past 2 years.  Took Zostavax Sees [facility] [place] [facility] and Dr Annamary Carolin for Mohs All other specialists are still located in [place]. See problem list for risk factors See AVS for preventative health reocmmendations   Preventative health care Patient encouraged to maintain heart healthy diet, regular exercise, adequate sleep. Consider daily probiotics. Take medications as prescribed. Encouraged to maintain good exercise patterns and clean diet.    Unsteady gait Referred to PT for further treatment she is encouraged to stay as active as possible

## 2014-11-25 NOTE — Assessment & Plan Note (Signed)
Encouraged DASH diet, decrease po intake and increase exercise as tolerated. Needs 7-8 hours of sleep nightly. Avoid trans fats, eat small, frequent meals every 4-5 hours with lean proteins, complex carbs and healthy fats. Minimize simple carbs 

## 2014-11-25 NOTE — Assessment & Plan Note (Signed)
TSH mildly suppressed but patient asymptomatic, no changes will recheck TSH in 12 weeks

## 2014-11-25 NOTE — Assessment & Plan Note (Signed)
Patient encouraged to maintain heart healthy diet, regular exercise, adequate sleep. Consider daily probiotics. Take medications as prescribed. Encouraged to maintain good exercise patterns and clean diet.

## 2014-11-25 NOTE — Assessment & Plan Note (Signed)
Continue vitamin D supplements and recheck levels in 3 months

## 2014-11-25 NOTE — Assessment & Plan Note (Signed)
Well controlled, no changes to meds. Encouraged heart healthy diet such as the DASH diet and exercise as tolerated.  °

## 2014-11-25 NOTE — Assessment & Plan Note (Signed)
macrobid bid and probiotics

## 2014-12-24 ENCOUNTER — Other Ambulatory Visit: Payer: Self-pay | Admitting: Family Medicine

## 2014-12-24 ENCOUNTER — Encounter: Payer: Self-pay | Admitting: Family Medicine

## 2014-12-24 ENCOUNTER — Telehealth: Payer: Self-pay | Admitting: Family Medicine

## 2014-12-24 DIAGNOSIS — M1990 Unspecified osteoarthritis, unspecified site: Secondary | ICD-10-CM

## 2014-12-24 NOTE — Telephone Encounter (Signed)
Have contacted the patient per PCP instructions from [place] message.  Rheumatology consult has been ordered and sed rate for the lab as well.  Patient aware.

## 2014-12-25 ENCOUNTER — Encounter: Payer: Self-pay | Admitting: Family Medicine

## 2014-12-26 ENCOUNTER — Other Ambulatory Visit (INDEPENDENT_AMBULATORY_CARE_PROVIDER_SITE_OTHER): Payer: Medicare HMO

## 2014-12-26 DIAGNOSIS — M1990 Unspecified osteoarthritis, unspecified site: Secondary | ICD-10-CM

## 2014-12-27 LAB — SEDIMENTATION RATE: Sed Rate: 11 mm/hr (ref 0–22)

## 2015-01-05 ENCOUNTER — Other Ambulatory Visit: Payer: Self-pay | Admitting: Family Medicine

## 2015-03-04 ENCOUNTER — Other Ambulatory Visit: Payer: Self-pay | Admitting: Family Medicine

## 2015-04-08 ENCOUNTER — Encounter: Payer: Self-pay | Admitting: Family Medicine

## 2015-04-25 ENCOUNTER — Encounter: Payer: Self-pay | Admitting: Family Medicine

## 2015-04-29 ENCOUNTER — Telehealth: Payer: Self-pay

## 2015-04-29 NOTE — Telephone Encounter (Addendum)
Attempted to call back to scheduled nurse for flublok.  No answer.  Unable to leave a message.  Sent message via [place].

## 2015-05-07 ENCOUNTER — Ambulatory Visit (INDEPENDENT_AMBULATORY_CARE_PROVIDER_SITE_OTHER): Payer: Medicare HMO

## 2015-05-07 DIAGNOSIS — Z23 Encounter for immunization: Secondary | ICD-10-CM | POA: Diagnosis not present

## 2015-05-10 NOTE — Telephone Encounter (Signed)
Appt was scheduled 05/07/15.

## 2015-05-22 DIAGNOSIS — M169 Osteoarthritis of hip, unspecified: Secondary | ICD-10-CM | POA: Diagnosis not present

## 2015-05-22 DIAGNOSIS — E559 Vitamin D deficiency, unspecified: Secondary | ICD-10-CM | POA: Diagnosis not present

## 2015-05-22 DIAGNOSIS — M19042 Primary osteoarthritis, left hand: Secondary | ICD-10-CM | POA: Diagnosis not present

## 2015-05-22 DIAGNOSIS — M179 Osteoarthritis of knee, unspecified: Secondary | ICD-10-CM | POA: Diagnosis not present

## 2015-05-22 DIAGNOSIS — M4806 Spinal stenosis, lumbar region: Secondary | ICD-10-CM | POA: Diagnosis not present

## 2015-05-22 DIAGNOSIS — M19041 Primary osteoarthritis, right hand: Secondary | ICD-10-CM | POA: Diagnosis not present

## 2015-05-22 DIAGNOSIS — M79643 Pain in unspecified hand: Secondary | ICD-10-CM | POA: Diagnosis not present

## 2015-05-23 DIAGNOSIS — M25552 Pain in left hip: Secondary | ICD-10-CM | POA: Diagnosis not present

## 2015-05-23 DIAGNOSIS — M25551 Pain in right hip: Secondary | ICD-10-CM | POA: Diagnosis not present

## 2015-07-07 HISTORY — PX: CATARACT EXTRACTION: SUR2

## 2015-08-06 ENCOUNTER — Other Ambulatory Visit: Payer: Self-pay | Admitting: Family Medicine

## 2015-08-06 MED ORDER — LISINOPRIL-HYDROCHLOROTHIAZIDE 10-12.5 MG PO TABS: 1.0000 | ORAL_TABLET | Freq: Every day | ORAL | Status: DC

## 2015-09-02 DIAGNOSIS — M179 Osteoarthritis of knee, unspecified: Secondary | ICD-10-CM | POA: Diagnosis not present

## 2015-09-02 DIAGNOSIS — M25512 Pain in left shoulder: Secondary | ICD-10-CM | POA: Diagnosis not present

## 2015-09-02 DIAGNOSIS — E559 Vitamin D deficiency, unspecified: Secondary | ICD-10-CM | POA: Diagnosis not present

## 2015-09-02 DIAGNOSIS — M4806 Spinal stenosis, lumbar region: Secondary | ICD-10-CM | POA: Diagnosis not present

## 2015-09-02 DIAGNOSIS — M169 Osteoarthritis of hip, unspecified: Secondary | ICD-10-CM | POA: Diagnosis not present

## 2015-09-18 ENCOUNTER — Encounter: Payer: Self-pay | Admitting: Family Medicine

## 2015-09-19 ENCOUNTER — Other Ambulatory Visit: Payer: Self-pay | Admitting: Family Medicine

## 2015-09-19 ENCOUNTER — Other Ambulatory Visit: Payer: Medicare HMO

## 2015-09-19 DIAGNOSIS — M5489 Other dorsalgia: Secondary | ICD-10-CM

## 2015-09-19 DIAGNOSIS — R252 Cramp and spasm: Secondary | ICD-10-CM

## 2015-09-19 DIAGNOSIS — E038 Other specified hypothyroidism: Secondary | ICD-10-CM

## 2015-09-20 ENCOUNTER — Other Ambulatory Visit (INDEPENDENT_AMBULATORY_CARE_PROVIDER_SITE_OTHER): Payer: Medicare HMO

## 2015-09-20 DIAGNOSIS — E038 Other specified hypothyroidism: Secondary | ICD-10-CM

## 2015-09-20 DIAGNOSIS — R252 Cramp and spasm: Secondary | ICD-10-CM

## 2015-09-20 DIAGNOSIS — M5489 Other dorsalgia: Secondary | ICD-10-CM | POA: Diagnosis not present

## 2015-09-20 LAB — COMPREHENSIVE METABOLIC PANEL
ALT: 13 U/L (ref 0–35)
AST: 13 U/L (ref 0–37)
Albumin: 3.9 g/dL (ref 3.5–5.2)
Alkaline Phosphatase: 61 U/L (ref 39–117)
BILIRUBIN TOTAL: 0.4 mg/dL (ref 0.2–1.2)
BUN: 16 mg/dL (ref 6–23)
CO2: 29 meq/L (ref 19–32)
Calcium: 9.2 mg/dL (ref 8.4–10.5)
Chloride: 104 mEq/L (ref 96–112)
Creatinine, Ser: 0.76 mg/dL (ref 0.40–1.20)
GFR: 78.94 mL/min (ref >60.00)
GLUCOSE: 79 mg/dL (ref 70–99)
Potassium: 3.8 mEq/L (ref 3.5–5.1)
Sodium: 140 mEq/L (ref 135–145)
Total Protein: 7 g/dL (ref 6.0–8.3)

## 2015-09-20 LAB — MAGNESIUM: MAGNESIUM: 2.1 mg/dL (ref 1.5–2.5)

## 2015-09-20 LAB — CBC WITH DIFFERENTIAL/PLATELET
BASOS ABS: 0 10*3/uL (ref 0.0–0.1)
Basophils Relative: 0.4 % (ref 0.0–3.0)
Eosinophils Absolute: 0.3 10*3/uL (ref 0.0–0.7)
Eosinophils Relative: 3.8 % (ref 0.0–5.0)
HCT: 44 % (ref 36.0–46.0)
Hemoglobin: 14.6 g/dL (ref 12.0–15.0)
LYMPHS ABS: 1.4 10*3/uL (ref 0.7–4.0)
Lymphocytes Relative: 19.1 % (ref 12.0–46.0)
MCHC: 33.3 g/dL (ref 30.0–36.0)
MCV: 90 fl (ref 78.0–100.0)
MONOS PCT: 8.4 % (ref 3.0–12.0)
Monocytes Absolute: 0.6 10*3/uL (ref 0.1–1.0)
NEUTROS ABS: 5.2 10*3/uL (ref 1.4–7.7)
NEUTROS PCT: 68.3 % (ref 43.0–77.0)
PLATELETS: 266 10*3/uL (ref 150.0–400.0)
RBC: 4.89 Mil/uL (ref 3.87–5.11)
RDW: 14.8 % (ref 11.5–15.5)
WBC: 7.6 10*3/uL (ref 4.0–10.5)

## 2015-09-20 LAB — TSH: TSH: 5.77 u[IU]/mL — AB (ref 0.35–4.50)

## 2015-09-22 ENCOUNTER — Encounter: Payer: Self-pay | Admitting: Family Medicine

## 2015-09-23 ENCOUNTER — Other Ambulatory Visit: Payer: Self-pay | Admitting: Family Medicine

## 2015-09-28 ENCOUNTER — Other Ambulatory Visit: Payer: Self-pay | Admitting: Family Medicine

## 2015-10-31 ENCOUNTER — Ambulatory Visit (INDEPENDENT_AMBULATORY_CARE_PROVIDER_SITE_OTHER): Payer: Medicare HMO | Admitting: Adult Health

## 2015-10-31 ENCOUNTER — Other Ambulatory Visit: Payer: Self-pay | Admitting: Family Medicine

## 2015-10-31 ENCOUNTER — Encounter: Payer: Self-pay | Admitting: Adult Health

## 2015-10-31 ENCOUNTER — Encounter: Payer: Self-pay | Admitting: Family Medicine

## 2015-10-31 VITALS — BP 108/60 | Temp 98.0°F | Wt 194.6 lb

## 2015-10-31 DIAGNOSIS — J302 Other seasonal allergic rhinitis: Secondary | ICD-10-CM

## 2015-10-31 DIAGNOSIS — J209 Acute bronchitis, unspecified: Secondary | ICD-10-CM

## 2015-10-31 MED ORDER — PREDNISONE 10 MG PO TABS: 10.0000 mg | ORAL_TABLET | Freq: Every day | ORAL | Status: DC

## 2015-10-31 MED ORDER — BENZONATATE 200 MG PO CAPS: 200.0000 mg | ORAL_CAPSULE | Freq: Two times a day (BID) | ORAL | Status: DC | PRN

## 2015-10-31 MED ORDER — MONTELUKAST SODIUM 10 MG PO TABS: 10.0000 mg | ORAL_TABLET | Freq: Every day | ORAL | Status: DC

## 2015-10-31 NOTE — Patient Instructions (Addendum)
It was great meeting you today  Your exam is consistent with bronchitis.   I have sent in a prescription for prednisone, take as directed  40 mg x 3 days, 20 mg x 3 days, 10 mg x 3 days  I have also sent in a prescription for Tessalon Pearls, take these as directed.   Restart your seasonal allergy medication.   Follow up with PCP if no improvement.   Acute Bronchitis Bronchitis is inflammation of the airways that extend from the windpipe into the lungs (bronchi). The inflammation often causes mucus to develop. This leads to a cough, which is the most common symptom of bronchitis.  In acute bronchitis, the condition usually develops suddenly and goes away over time, usually in a couple weeks. Smoking, allergies, and asthma can make bronchitis worse. Repeated episodes of bronchitis may cause further lung problems.  CAUSES Acute bronchitis is most often caused by the same virus that causes a cold. The virus can spread from person to person (contagious) through coughing, sneezing, and touching contaminated objects. SIGNS AND SYMPTOMS   Cough.   Fever.   Coughing up mucus.   Body aches.   Chest congestion.   Chills.   Shortness of breath.   Sore throat.  DIAGNOSIS  Acute bronchitis is usually diagnosed through a physical exam. Your health care provider will also ask you questions about your medical history. Tests, such as chest X-rays, are sometimes done to rule out other conditions.  TREATMENT  Acute bronchitis usually goes away in a couple weeks. Oftentimes, no medical treatment is necessary. Medicines are sometimes given for relief of fever or cough. Antibiotic medicines are usually not needed but may be prescribed in certain situations. In some cases, an inhaler may be recommended to help reduce shortness of breath and control the cough. A cool mist vaporizer may also be used to help thin bronchial secretions and make it easier to clear the chest.  HOME CARE  INSTRUCTIONS  Get plenty of rest.   Drink enough fluids to keep your urine clear or pale yellow (unless you have a medical condition that requires fluid restriction). Increasing fluids may help thin your respiratory secretions (sputum) and reduce chest congestion, and it will prevent dehydration.   Take medicines only as directed by your health care provider.  If you were prescribed an antibiotic medicine, finish it all even if you start to feel better.  Avoid smoking and secondhand smoke. Exposure to cigarette smoke or irritating chemicals will make bronchitis worse. If you are a smoker, consider using nicotine gum or skin patches to help control withdrawal symptoms. Quitting smoking will help your lungs heal faster.   Reduce the chances of another bout of acute bronchitis by washing your hands frequently, avoiding people with cold symptoms, and trying not to touch your hands to your mouth, nose, or eyes.   Keep all follow-up visits as directed by your health care provider.  SEEK MEDICAL CARE IF: Your symptoms do not improve after 1 week of treatment.  SEEK IMMEDIATE MEDICAL CARE IF:  You develop an increased fever or chills.   You have chest pain.   You have severe shortness of breath.  You have bloody sputum.   You develop dehydration.  You faint or repeatedly feel like you are going to pass out.  You develop repeated vomiting.  You develop a severe headache. MAKE SURE YOU:   Understand these instructions.  Will watch your condition.  Will get help right away if you  are not doing well or get worse.   This information is not intended to replace advice given to you by your health care provider. Make sure you discuss any questions you have with your health care provider.   Document Released: 07/30/2004 Document Revised: 07/13/2014 Document Reviewed: 12/13/2012 Elsevier Interactive Patient Education [employer].

## 2015-10-31 NOTE — Progress Notes (Signed)
Subjective:    Patient ID: Felicia Lowe, female    DOB: 1940/07/20, 75 y.o.   MRN: [id number]  HPI  75 year old female who presents to the office today for semi productive cough that started 7 days ago and tightness in the chest.   She denies fevers, sinus pain and pressure or ear pain.She does have clear rhinorrhea and feels as though this is seasonal allergies. She has not been taking her allergy medication.   She does not have a history of asthma or COPD.   She has had bronchitis in the the past and feels as though this is what it feels like.   Review of Systems  Constitutional: Negative.   HENT: Positive for congestion and rhinorrhea. Negative for ear discharge, ear pain, postnasal drip, sinus pressure and sore throat.   Eyes: Negative.   Respiratory: Positive for cough. Negative for apnea, shortness of breath and wheezing.   Cardiovascular: Negative.   Musculoskeletal: Negative.   Allergic/Immunologic: Positive for environmental allergies.  Neurological: Negative.   All other systems reviewed and are negative.  Past Medical History  Diagnosis Date  . Thyroid disease   . Hypertension   . Vitamin D deficiency   . Chicken pox as a child  . Measles as a child  . Mumps as a child  . Osteoporosis   . Obesity   . Kidney stone 2000    nephritis as a child  . Allergy   . H/O breast biopsy     benign, on left  . Arthritis     osteoarthritis, severe, b/l knees &b/l hips  replaced  . Skin lesion of face 05/18/2012  . UTI (urinary tract infection) 12/11/2012  . BPV (benign positional vertigo) 02/19/2013    mild  . Viral respiratory illness 05/21/2013  . GERD (gastroesophageal reflux disease) 05/27/2014  . Preventative health care 11/25/2014  . Unsteady gait 11/25/2014    Social History   Social History  . Marital Status: Unknown    Spouse Name: N/A  . Number of Children: N/A  . Years of Education: N/A   Occupational History  . Not on file.   Social History Main  Topics  . Smoking status: Never Smoker   . Smokeless tobacco: Never Used  . Alcohol Use: Yes     Comment: Occasional glass of wine  . Drug Use: No  . Sexual Activity: No   Other Topics Concern  . Not on file   Social History Narrative    Past Surgical History  Procedure Laterality Date  . Knee surgery      both knees replaced  . Hip surgery      both hips    Family History  Problem Relation Age of Onset  . Hyperlipidemia Mother   . Hypertension Mother   . Heart attack Mother   . Cancer Father     lung- smoker  . Heart attack Father     X 2  . Thyroid disease Brother   . Hypertension Son   . Cancer Paternal Grandmother     Allergies  Allergen Reactions  . Eggs Or Egg-Derived Products   . Keflex [Cephalexin]     Rash   . Sulfa Antibiotics Nausea And Vomiting  . Penicillins Rash  . Poison Ivy Extract [Extract Of Poison Ivy] Rash    Current Outpatient Prescriptions on File Prior to Visit  Medication Sig Dispense Refill  . Ascorbic Acid (VITAMIN C) 1000 MG tablet Take 1,000 mg by mouth  daily.    . aspirin 81 MG tablet Take 81 mg by mouth daily.    . Cholecalciferol (VITAMIN D) 2000 UNITS tablet Take 2,000 Units by mouth daily.    . Coenzyme Q10 (CO Q 10) 100 MG CAPS Take 400 mg by mouth daily.    . furosemide (LASIX) 20 MG tablet TAKE 1 TABLET (20 MG TOTAL) BY MOUTH DAILY AS NEEDED. 30 tablet 4  . lisinopril-hydrochlorothiazide (PRINZIDE,ZESTORETIC) 10-12.5 MG tablet Take 1 tablet by mouth daily. 90 tablet 0  . nitrofurantoin, macrocrystal-monohydrate, (MACROBID) 100 MG capsule Take 1 capsule (100 mg total) by mouth 2 (two) times daily. 10 capsule 0  . nystatin (MYCOSTATIN/NYSTOP) 100000 UNIT/GM POWD 1 tsp. Po tid X 7 days (Patient taking differently: No sig reported) 60 g 3  . Omega-3 Fatty Acids (FISH OIL) 1000 MG CPDR Take 1 capsule by mouth daily.    . Probiotic Product (PROBIOTIC DAILY PO) Take by mouth.    . SYNTHROID 150 MCG tablet TAKE 1 TABLET (150 MCG  TOTAL) BY MOUTH DAILY BEFORE BREAKFAST. 30 tablet 6  . triamcinolone cream (KENALOG) 0.1 % Apply 1 application topically 2 (two) times daily. 85.2 g 3  . montelukast (SINGULAIR) 10 MG tablet Take 1 tablet (10 mg total) by mouth at bedtime. (Patient not taking: Reported on 10/31/2015) 30 tablet 3   No current facility-administered medications on file prior to visit.    BP 108/60 mmHg  Temp(Src) 98 F (36.7 C) (Oral)  Wt 194 lb 9.6 oz (88.27 kg)       Objective:   Physical Exam  Constitutional: She is oriented to person, place, and time. She appears well-developed and well-nourished. No distress.  HENT:  Head: Normocephalic and atraumatic.  Right Ear: Hearing, tympanic membrane, external ear and ear canal normal.  Left Ear: Hearing, tympanic membrane, external ear and ear canal normal.  Nose: Mucosal edema and rhinorrhea present.  Mouth/Throat: Uvula is midline and oropharynx is clear and moist.  Eyes: Conjunctivae and EOM are normal. Pupils are equal, round, and reactive to light. Right eye exhibits no discharge. Left eye exhibits no discharge. No scleral icterus.  Neck: Normal range of motion. Neck supple.  Cardiovascular: Normal rate, regular rhythm, normal heart sounds and intact distal pulses.  Exam reveals no gallop and no friction rub.   No murmur heard. Pulmonary/Chest: Effort normal and breath sounds normal. No respiratory distress. She has no wheezes. She has no rales. She exhibits no tenderness.  Increased expiratory phase  Lymphadenopathy:    She has no cervical adenopathy.  Neurological: She is alert and oriented to person, place, and time.  Skin: Skin is warm and dry. No rash noted. She is not diaphoretic. No erythema. No pallor.  Psychiatric: She has a normal mood and affect. Her behavior is normal. Judgment and thought content normal.  Nursing note and vitals reviewed.     Assessment & Plan:  1. Acute bronchitis, unspecified organism - predniSONE (DELTASONE) 10 MG  tablet; Take 1 tablet (10 mg total) by mouth daily with breakfast. 40 mg x 3 days, 20 mg x 3 days, 10 mg x 3 days  Dispense: 21 tablet; Refill: 0 - benzonatate (TESSALON) 200 MG capsule; Take 1 capsule (200 mg total) by mouth 2 (two) times daily as needed for cough.  Dispense: 20 capsule; Refill: 2 - Stay hydrated - Mucinex during the day  - Follow up if no improvement.  2. Seasonal allergies - Restart seasonal allergy medication  - Add Flonase  Dorothyann Peng, NP

## 2015-11-07 ENCOUNTER — Ambulatory Visit (INDEPENDENT_AMBULATORY_CARE_PROVIDER_SITE_OTHER): Payer: Medicare HMO | Admitting: Family Medicine

## 2015-11-07 ENCOUNTER — Encounter: Payer: Self-pay | Admitting: Family Medicine

## 2015-11-07 VITALS — BP 90/59 | HR 84 | Temp 98.6°F | Resp 16 | Ht 62.0 in | Wt 194.8 lb

## 2015-11-07 DIAGNOSIS — J3089 Other allergic rhinitis: Secondary | ICD-10-CM

## 2015-11-07 DIAGNOSIS — J208 Acute bronchitis due to other specified organisms: Secondary | ICD-10-CM | POA: Diagnosis not present

## 2015-11-07 DIAGNOSIS — J019 Acute sinusitis, unspecified: Secondary | ICD-10-CM | POA: Diagnosis not present

## 2015-11-07 MED ORDER — ALBUTEROL SULFATE HFA 108 (90 BASE) MCG/ACT IN AERS: 2.0000 | INHALATION_SPRAY | RESPIRATORY_TRACT | Status: DC | PRN

## 2015-11-07 MED ORDER — PREDNISONE 20 MG PO TABS: ORAL_TABLET | ORAL | Status: DC

## 2015-11-07 NOTE — Progress Notes (Signed)
Pre visit review using our clinic review tool, if applicable. No additional management support is needed unless otherwise documented below in the visit note. 
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
## 2015-11-07 NOTE — Patient Instructions (Signed)
Get otc generic robitussin DM OR Mucinex DM and use as directed on the packaging for cough and congestion. Use otc generic saline nasal spray 2-3 times per day to irrigate/moisturize your nasal passages.   

## 2015-11-08 ENCOUNTER — Ambulatory Visit: Payer: Medicare HMO | Admitting: Family Medicine

## 2015-12-03 DIAGNOSIS — M179 Osteoarthritis of knee, unspecified: Secondary | ICD-10-CM | POA: Diagnosis not present

## 2015-12-03 DIAGNOSIS — M169 Osteoarthritis of hip, unspecified: Secondary | ICD-10-CM | POA: Diagnosis not present

## 2015-12-03 DIAGNOSIS — M4806 Spinal stenosis, lumbar region: Secondary | ICD-10-CM | POA: Diagnosis not present

## 2015-12-03 DIAGNOSIS — E559 Vitamin D deficiency, unspecified: Secondary | ICD-10-CM | POA: Diagnosis not present

## 2015-12-04 ENCOUNTER — Other Ambulatory Visit: Payer: Self-pay | Admitting: Family Medicine

## 2015-12-04 NOTE — Telephone Encounter (Signed)
Rx sent to the pharmacy by e-script.//AB/CMA 

## 2015-12-06 DIAGNOSIS — H25811 Combined forms of age-related cataract, right eye: Secondary | ICD-10-CM | POA: Diagnosis not present

## 2015-12-06 DIAGNOSIS — H25812 Combined forms of age-related cataract, left eye: Secondary | ICD-10-CM | POA: Diagnosis not present

## 2015-12-10 DIAGNOSIS — H25811 Combined forms of age-related cataract, right eye: Secondary | ICD-10-CM | POA: Diagnosis not present

## 2015-12-10 DIAGNOSIS — H25812 Combined forms of age-related cataract, left eye: Secondary | ICD-10-CM | POA: Diagnosis not present

## 2015-12-23 DIAGNOSIS — H2512 Age-related nuclear cataract, left eye: Secondary | ICD-10-CM | POA: Diagnosis not present

## 2015-12-23 DIAGNOSIS — H25812 Combined forms of age-related cataract, left eye: Secondary | ICD-10-CM | POA: Diagnosis not present

## 2016-01-13 ENCOUNTER — Encounter: Payer: Self-pay | Admitting: Family Medicine

## 2016-01-13 NOTE — Telephone Encounter (Signed)
Tiffany, please call the Pt and schedule appt at her convenience. See MyChart message for questions. Thank you.

## 2016-01-14 ENCOUNTER — Other Ambulatory Visit: Payer: Self-pay | Admitting: Family Medicine

## 2016-01-14 DIAGNOSIS — R300 Dysuria: Secondary | ICD-10-CM

## 2016-01-15 ENCOUNTER — Other Ambulatory Visit (INDEPENDENT_AMBULATORY_CARE_PROVIDER_SITE_OTHER): Payer: Medicare HMO

## 2016-01-15 DIAGNOSIS — R300 Dysuria: Secondary | ICD-10-CM | POA: Diagnosis not present

## 2016-01-15 LAB — URINALYSIS, ROUTINE W REFLEX MICROSCOPIC
Bilirubin Urine: NEGATIVE
KETONES UR: NEGATIVE
Nitrite: NEGATIVE
SPECIFIC GRAVITY, URINE: 1.02 (ref 1.000–1.030)
Total Protein, Urine: NEGATIVE
Urine Glucose: NEGATIVE
Urobilinogen, UA: 0.2 (ref 0.0–1.0)
pH: 6 (ref 5.0–8.0)

## 2016-01-15 LAB — CBC WITH DIFFERENTIAL/PLATELET
BASOS PCT: 0.4 % (ref 0.0–3.0)
Basophils Absolute: 0 10*3/uL (ref 0.0–0.1)
EOS PCT: 3.7 % (ref 0.0–5.0)
Eosinophils Absolute: 0.2 10*3/uL (ref 0.0–0.7)
HCT: 41.6 % (ref 36.0–46.0)
HEMOGLOBIN: 14 g/dL (ref 12.0–15.0)
LYMPHS ABS: 1.3 10*3/uL (ref 0.7–4.0)
Lymphocytes Relative: 23.7 % (ref 12.0–46.0)
MCHC: 33.6 g/dL (ref 30.0–36.0)
MCV: 88.8 fl (ref 78.0–100.0)
MONO ABS: 0.5 10*3/uL (ref 0.1–1.0)
Monocytes Relative: 9.8 % (ref 3.0–12.0)
NEUTROS PCT: 62.4 % (ref 43.0–77.0)
Neutro Abs: 3.5 10*3/uL (ref 1.4–7.7)
Platelets: 311 10*3/uL (ref 150.0–400.0)
RBC: 4.68 Mil/uL (ref 3.87–5.11)
RDW: 13.3 % (ref 11.5–15.5)
WBC: 5.5 10*3/uL (ref 4.0–10.5)

## 2016-01-17 ENCOUNTER — Other Ambulatory Visit: Payer: Self-pay | Admitting: Family Medicine

## 2016-01-17 DIAGNOSIS — B372 Candidiasis of skin and nail: Secondary | ICD-10-CM

## 2016-01-17 LAB — CULTURE, URINE COMPREHENSIVE

## 2016-01-17 MED ORDER — TRIAMCINOLONE ACETONIDE 0.1 % EX CREA: 1.0000 "application " | TOPICAL_CREAM | Freq: Two times a day (BID) | CUTANEOUS | Status: DC

## 2016-01-17 MED ORDER — CIPROFLOXACIN HCL 250 MG PO TABS: 250.0000 mg | ORAL_TABLET | Freq: Two times a day (BID) | ORAL | Status: DC

## 2016-01-21 ENCOUNTER — Ambulatory Visit: Payer: Medicare HMO | Admitting: Family Medicine

## 2016-01-21 ENCOUNTER — Encounter: Payer: Self-pay | Admitting: Physician Assistant

## 2016-01-21 ENCOUNTER — Ambulatory Visit (INDEPENDENT_AMBULATORY_CARE_PROVIDER_SITE_OTHER): Payer: Medicare HMO | Admitting: Physician Assistant

## 2016-01-21 VITALS — BP 100/68 | HR 92 | Temp 98.0°F | Resp 16 | Ht 62.0 in | Wt 194.1 lb

## 2016-01-21 DIAGNOSIS — B359 Dermatophytosis, unspecified: Secondary | ICD-10-CM | POA: Diagnosis not present

## 2016-01-21 MED ORDER — KETOCONAZOLE 2 % EX CREA
1.0000 | TOPICAL_CREAM | Freq: Every day | CUTANEOUS | Status: DC
Start: 2016-01-21 — End: 2016-04-21

## 2016-01-21 NOTE — Patient Instructions (Signed)
Please continue the steroid cream Start the Ketoconazole cream once daily to the areas. Do this over the next 2 weeks. If you do not notice improvement within one week, we may have to try oral medication or set you up with Dermatology.  Cool compresses will also help with itch.

## 2016-01-21 NOTE — Progress Notes (Signed)
Pre visit review using our clinic review tool, if applicable. No additional management support is needed unless otherwise documented below in the visit note/SLS  

## 2016-01-23 NOTE — Progress Notes (Signed)
Patient presents to clinic today c/o pruritic rash on flexural surfaces of lower extremities bilaterally, mainly behind knees x 1 month. Denies change in soaps, lotions, hygiene products. Denies pet in the home. Denies contact with similar symptoms. Denies known exposure to contact irritants. No rash elsewhere. States rash is red and flaky in the center.  Past Medical History  Diagnosis Date  . Thyroid disease   . Hypertension   . Vitamin D deficiency   . Chicken pox as a child  . Measles as a child  . Mumps as a child  . Osteoporosis   . Obesity   . Kidney stone 2000    nephritis as a child  . Allergy   . H/O breast biopsy     benign, on left  . Arthritis     osteoarthritis, severe, b/l knees &b/l hips  replaced  . Skin lesion of face 05/18/2012  . UTI (urinary tract infection) 12/11/2012  . BPV (benign positional vertigo) 02/19/2013    mild  . Viral respiratory illness 05/21/2013  . GERD (gastroesophageal reflux disease) 05/27/2014  . Preventative health care 11/25/2014  . Unsteady gait 11/25/2014    Current Outpatient Prescriptions on File Prior to Visit  Medication Sig Dispense Refill  . Ascorbic Acid (VITAMIN C) 1000 MG tablet Take 1,000 mg by mouth daily.    Marland Kitchen aspirin 81 MG tablet Take 81 mg by mouth daily.    . Cholecalciferol (VITAMIN D) 2000 UNITS tablet Take 2,000 Units by mouth daily.    . ciprofloxacin (CIPRO) 250 MG tablet Take 1 tablet (250 mg total) by mouth 2 (two) times daily. 10 tablet 0  . Coenzyme Q10 (CO Q 10) 100 MG CAPS Take 400 mg by mouth daily.    . furosemide (LASIX) 20 MG tablet TAKE 1 TABLET (20 MG TOTAL) BY MOUTH DAILY AS NEEDED. 30 tablet 4  . lisinopril-hydrochlorothiazide (PRINZIDE,ZESTORETIC) 10-12.5 MG tablet TAKE 1 TABLET BY MOUTH DAILY. (Patient taking differently: TAKE ONE TABLET BY MOUTH DAILY.Marland KitchenTAKES SIX DAYS A WEEK PER PATIENT) 90 tablet 1  . nystatin (MYCOSTATIN/NYSTOP) 100000 UNIT/GM POWD 1 tsp. Po tid X 7 days (Patient taking  differently: No sig reported) 60 g 3  . Omega-3 Fatty Acids (FISH OIL) 1000 MG CPDR Take 1 capsule by mouth daily.    . Probiotic Product (PROBIOTIC DAILY PO) Take by mouth.    . SYNTHROID 150 MCG tablet TAKE 1 TABLET (150 MCG TOTAL) BY MOUTH DAILY BEFORE BREAKFAST. 30 tablet 6  . triamcinolone cream (KENALOG) 0.1 % Apply 1 application topically 2 (two) times daily. 85.2 g 3  . albuterol (PROAIR HFA) 108 (90 Base) MCG/ACT inhaler Inhale 2 puffs into the lungs every 4 (four) hours as needed for wheezing or shortness of breath. (Patient not taking: Reported on 01/21/2016) 1 Inhaler 0   No current facility-administered medications on file prior to visit.    Allergies  Allergen Reactions  . Eggs Or Egg-Derived Products   . Keflex [Cephalexin]     Rash   . Sulfa Antibiotics Nausea And Vomiting  . Penicillins Rash  . Poison Ivy Extract [Extract Of Poison Ivy] Rash    Family History  Problem Relation Age of Onset  . Hyperlipidemia Mother   . Hypertension Mother   . Heart attack Mother   . Cancer Father     lung- smoker  . Heart attack Father     X 2  . Thyroid disease Brother   . Hypertension Son   .  Cancer Paternal Grandmother     Social History   Social History  . Marital Status: Unknown    Spouse Name: N/A  . Number of Children: N/A  . Years of Education: N/A   Social History Main Topics  . Smoking status: Never Smoker   . Smokeless tobacco: Never Used  . Alcohol Use: Yes     Comment: Occasional glass of wine  . Drug Use: No  . Sexual Activity: No   Other Topics Concern  . None   Social History Narrative    Review of Systems - See HPI.  All other ROS are negative.  BP 100/68 mmHg  Pulse 92  Temp(Src) 98 F (36.7 C) (Oral)  Resp 16  Ht 5\' 2"  (1.575 m)  Wt 194 lb 2 oz (88.055 kg)  BMI 35.50 kg/m2  SpO2 96%  Physical Exam  Constitutional: She is oriented to person, place, and time and well-developed, well-nourished, and in no distress.  HENT:  Head:  Normocephalic and atraumatic.  Eyes: Conjunctivae are normal.  Neck: Neck supple.  Cardiovascular: Normal rate, regular rhythm, normal heart sounds and intact distal pulses.   Pulmonary/Chest: Effort normal and breath sounds normal. No respiratory distress. She has no wheezes. She has no rales. She exhibits no tenderness.  Neurological: She is alert and oriented to person, place, and time.  Skin: Skin is warm and dry.     Psychiatric: Affect normal.  Vitals reviewed.   Recent Results (from the past 2160 hour(s))  CULTURE, URINE COMPREHENSIVE     Status: None   Collection Time: 01/15/16 10:19 AM  Result Value Ref Range   Culture ESCHERICHIA COLI    Colony Count 40,000 COLONIES/ML    Organism ID, Bacteria ESCHERICHIA COLI       Susceptibility   Escherichia coli -  (no method available)    AMPICILLIN <=2 Sensitive     AMOX/CLAVULANIC <=2 Sensitive     AMPICILLIN/SULBACTAM <=2 Sensitive     PIP/TAZO <=4 Sensitive     IMIPENEM <=0.25 Sensitive     CEFAZOLIN <=4 Not Reportable     CEFTRIAXONE <=1 Sensitive     CEFTAZIDIME <=1 Sensitive     CEFEPIME <=1 Sensitive     GENTAMICIN <=1 Sensitive     TOBRAMYCIN <=1 Sensitive     CIPROFLOXACIN <=0.25 Sensitive     LEVOFLOXACIN <=0.12 Sensitive     NITROFURANTOIN 32 Sensitive     TRIMETH/SULFA* <=20 Sensitive      * NR=NOT REPORTABLE,SEE COMMENTORAL therapy:A cefazolin MIC of <32 predicts susceptibility to the oral agents cefaclor,cefdinir,cefpodoxime,cefprozil,cefuroxime,cephalexin,and loracarbef when used for therapy of uncomplicated UTIs due to E.coli,K.pneumomiae,and P.mirabilis. PARENTERAL therapy: A cefazolinMIC of >8 indicates resistance to parenteralcefazolin. An alternate test method must beperformed to confirm susceptibility to parenteralcefazolin.  CBC with Differential/Platelet     Status: None   Collection Time: 01/15/16 10:19 AM  Result Value Ref Range   WBC 5.5 4.0 - 10.5 K/uL   RBC 4.68 3.87 - 5.11 Mil/uL   Hemoglobin  14.0 12.0 - 15.0 g/dL   HCT 41.6 36.0 - 46.0 %   MCV 88.8 78.0 - 100.0 fl   MCHC 33.6 30.0 - 36.0 g/dL   RDW 13.3 11.5 - 15.5 %   Platelets 311.0 150.0 - 400.0 K/uL   Neutrophils Relative % 62.4 43.0 - 77.0 %   Lymphocytes Relative 23.7 12.0 - 46.0 %   Monocytes Relative 9.8 3.0 - 12.0 %   Eosinophils Relative 3.7 0.0 - 5.0 %  Basophils Relative 0.4 0.0 - 3.0 %   Neutro Abs 3.5 1.4 - 7.7 K/uL   Lymphs Abs 1.3 0.7 - 4.0 K/uL   Monocytes Absolute 0.5 0.1 - 1.0 K/uL   Eosinophils Absolute 0.2 0.0 - 0.7 K/uL   Basophils Absolute 0.0 0.0 - 0.1 K/uL  Urinalysis, Routine w reflex microscopic (not at [facility])     Status: Abnormal   Collection Time: 01/15/16 10:19 AM  Result Value Ref Range   Color, Urine YELLOW Yellow;Lt. Yellow   APPearance CLEAR Clear   Specific Gravity, Urine 1.020 1.000-1.030   pH 6.0 5.0 - 8.0   Total Protein, Urine NEGATIVE Negative   Urine Glucose NEGATIVE Negative   Ketones, ur NEGATIVE Negative   Bilirubin Urine NEGATIVE Negative   Hgb urine dipstick TRACE-INTACT (A) Negative   Urobilinogen, UA 0.2 0.0 - 1.0   Leukocytes, UA MODERATE (A) Negative   Nitrite NEGATIVE Negative   WBC, UA 7-10/hpf (A) 0-2/hpf   RBC / HPF 0-2/hpf 0-2/hpf   Mucus, UA Presence of (A) None   Squamous Epithelial / LPF Rare(0-4/hpf) Rare(0-4/hpf)   Bacteria, UA Rare(<10/hpf) (A) None   Ca Oxalate Crys, UA Presence of (A) None    Assessment/Plan: 1. Tinea Rash has a yeast/fungal appearance. Continue topical steroid. Will start topical Nizoral as directed. Supportive measures reviewed. If not resolving we will need Dermatology input. - ketoconazole (NIZORAL) 2 % cream; Apply 1 application topically daily.  Dispense: 15 g; Refill: 0   Leeanne Rio, [place]

## 2016-03-06 ENCOUNTER — Ambulatory Visit (INDEPENDENT_AMBULATORY_CARE_PROVIDER_SITE_OTHER): Payer: Medicare HMO | Admitting: Family Medicine

## 2016-03-06 VITALS — BP 116/58 | HR 86 | Temp 98.0°F | Wt 193.2 lb

## 2016-03-06 DIAGNOSIS — K219 Gastro-esophageal reflux disease without esophagitis: Secondary | ICD-10-CM

## 2016-03-06 DIAGNOSIS — E2839 Other primary ovarian failure: Secondary | ICD-10-CM

## 2016-03-06 DIAGNOSIS — E559 Vitamin D deficiency, unspecified: Secondary | ICD-10-CM

## 2016-03-06 DIAGNOSIS — I10 Essential (primary) hypertension: Secondary | ICD-10-CM

## 2016-03-06 DIAGNOSIS — N390 Urinary tract infection, site not specified: Secondary | ICD-10-CM | POA: Diagnosis not present

## 2016-03-06 DIAGNOSIS — E669 Obesity, unspecified: Secondary | ICD-10-CM | POA: Diagnosis not present

## 2016-03-06 DIAGNOSIS — Z0000 Encounter for general adult medical examination without abnormal findings: Secondary | ICD-10-CM | POA: Diagnosis not present

## 2016-03-06 DIAGNOSIS — L309 Dermatitis, unspecified: Secondary | ICD-10-CM

## 2016-03-06 DIAGNOSIS — E079 Disorder of thyroid, unspecified: Secondary | ICD-10-CM | POA: Diagnosis not present

## 2016-03-06 DIAGNOSIS — Z23 Encounter for immunization: Secondary | ICD-10-CM | POA: Diagnosis not present

## 2016-03-06 LAB — VITAMIN D 25 HYDROXY (VIT D DEFICIENCY, FRACTURES): VITD: 37.75 ng/mL (ref 30.00–100.00)

## 2016-03-06 LAB — URINALYSIS, ROUTINE W REFLEX MICROSCOPIC
BILIRUBIN URINE: NEGATIVE
Hgb urine dipstick: NEGATIVE
KETONES UR: NEGATIVE
NITRITE: NEGATIVE
PH: 7.5 (ref 5.0–8.0)
Specific Gravity, Urine: 1.01 (ref 1.000–1.030)
TOTAL PROTEIN, URINE-UPE24: NEGATIVE
UROBILINOGEN UA: 0.2 (ref 0.0–1.0)
Urine Glucose: NEGATIVE

## 2016-03-06 LAB — COMPREHENSIVE METABOLIC PANEL
ALBUMIN: 4.1 g/dL (ref 3.5–5.2)
ALT: 11 U/L (ref 0–35)
AST: 12 U/L (ref 0–37)
Alkaline Phosphatase: 64 U/L (ref 39–117)
BUN: 13 mg/dL (ref 6–23)
CALCIUM: 9.1 mg/dL (ref 8.4–10.5)
CHLORIDE: 104 meq/L (ref 96–112)
CO2: 30 mEq/L (ref 19–32)
Creatinine, Ser: 0.87 mg/dL (ref 0.40–1.20)
GFR: 67.45 mL/min (ref >60.00)
Glucose, Bld: 93 mg/dL (ref 70–99)
POTASSIUM: 4.1 meq/L (ref 3.5–5.1)
SODIUM: 139 meq/L (ref 135–145)
Total Bilirubin: 0.4 mg/dL (ref 0.2–1.2)
Total Protein: 7.1 g/dL (ref 6.0–8.3)

## 2016-03-06 LAB — CBC
HEMATOCRIT: 43 % (ref 36.0–46.0)
Hemoglobin: 14.5 g/dL (ref 12.0–15.0)
MCHC: 33.8 g/dL (ref 30.0–36.0)
MCV: 88.8 fl (ref 78.0–100.0)
Platelets: 276 10*3/uL (ref 150.0–400.0)
RBC: 4.85 Mil/uL (ref 3.87–5.11)
RDW: 14 % (ref 11.5–15.5)
WBC: 7.5 10*3/uL (ref 4.0–10.5)

## 2016-03-06 LAB — TSH: TSH: 0.28 u[IU]/mL — ABNORMAL LOW (ref 0.35–4.50)

## 2016-03-06 NOTE — Progress Notes (Signed)
Pre visit review using our clinic review tool, if applicable. No additional management support is needed unless otherwise documented below in the visit note. 

## 2016-03-06 NOTE — Assessment & Plan Note (Addendum)
Encouraged DASH diet, decrease po intake and increase exercise as tolerated. Needs 7-8 hours of sleep nightly. Avoid trans fats, eat small, frequent meals every 4-5 hours with lean proteins, complex carbs and healthy fats. Minimize simple carbs 

## 2016-03-06 NOTE — Assessment & Plan Note (Addendum)
Patient encouraged to maintain heart healthy diet, regular exercise, adequate sleep. Consider daily probiotics. Take medications as prescribed. Given and reviewed copy of ACP documents from [place] Secretary of State and encouraged to complete and return 

## 2016-03-06 NOTE — Assessment & Plan Note (Signed)
On Levothyroxine, continue to monitor 

## 2016-03-06 NOTE — Patient Instructions (Signed)
Preventive Care for Adults, Female A healthy lifestyle and preventive care can promote health and wellness. Preventive health guidelines for women include the following key practices.  A routine yearly physical is a good way to check with your health care provider about your health and preventive screening. It is a chance to share any concerns and updates on your health and to receive a thorough exam.  Visit your dentist for a routine exam and preventive care every 6 months. Brush your teeth twice a day and floss once a day. Good oral hygiene prevents tooth decay and gum disease.  The frequency of eye exams is based on your age, health, family medical history, use of contact lenses, and other factors. Follow your health care provider's recommendations for frequency of eye exams.  Eat a healthy diet. Foods like vegetables, fruits, whole grains, low-fat dairy products, and lean protein foods contain the nutrients you need without too many calories. Decrease your intake of foods high in solid fats, added sugars, and salt. Eat the right amount of calories for you.Get information about a proper diet from your health care provider, if necessary.  Regular physical exercise is one of the most important things you can do for your health. Most adults should get at least 150 minutes of moderate-intensity exercise (any activity that increases your heart rate and causes you to sweat) each week. In addition, most adults need muscle-strengthening exercises on 2 or more days a week.  Maintain a healthy weight. The body mass index (BMI) is a screening tool to identify possible weight problems. It provides an estimate of body fat based on height and weight. Your health care provider can find your BMI and can help you achieve or maintain a healthy weight.For adults 20 years and older:  A BMI below 18.5 is considered underweight.  A BMI of 18.5 to 24.9 is normal.  A BMI of 25 to 29.9 is considered overweight.  A  BMI of 30 and above is considered obese.  Maintain normal blood lipids and cholesterol levels by exercising and minimizing your intake of saturated fat. Eat a balanced diet with plenty of fruit and vegetables. Blood tests for lipids and cholesterol should begin at age 45 and be repeated every 5 years. If your lipid or cholesterol levels are high, you are over 50, or you are at high risk for heart disease, you may need your cholesterol levels checked more frequently.Ongoing high lipid and cholesterol levels should be treated with medicines if diet and exercise are not working.  If you smoke, find out from your health care provider how to quit. If you do not use tobacco, do not start.  Lung cancer screening is recommended for adults aged 45-80 years who are at high risk for developing lung cancer because of a history of smoking. A yearly low-dose CT scan of the lungs is recommended for people who have at least a 30-pack-year history of smoking and are a current smoker or have quit within the past 15 years. A pack year of smoking is smoking an average of 1 pack of cigarettes a day for 1 year (for example: 1 pack a day for 30 years or 2 packs a day for 15 years). Yearly screening should continue until the smoker has stopped smoking for at least 15 years. Yearly screening should be stopped for people who develop a health problem that would prevent them from having lung cancer treatment.  If you are pregnant, do not drink alcohol. If you are  breastfeeding, be very cautious about drinking alcohol. If you are not pregnant and choose to drink alcohol, do not have more than 1 drink per day. One drink is considered to be 12 ounces (355 mL) of beer, 5 ounces (148 mL) of wine, or 1.5 ounces (44 mL) of liquor.  Avoid use of street drugs. Do not share needles with anyone. Ask for help if you need support or instructions about stopping the use of drugs.  High blood pressure causes heart disease and increases the risk  of stroke. Your blood pressure should be checked at least every 1 to 2 years. Ongoing high blood pressure should be treated with medicines if weight loss and exercise do not work.  If you are 55-79 years old, ask your health care provider if you should take aspirin to prevent strokes.  Diabetes screening is done by taking a blood sample to check your blood glucose level after you have not eaten for a certain period of time (fasting). If you are not overweight and you do not have risk factors for diabetes, you should be screened once every 3 years starting at age 45. If you are overweight or obese and you are 40-70 years of age, you should be screened for diabetes every year as part of your cardiovascular risk assessment.  Breast cancer screening is essential preventive care for women. You should practice "breast self-awareness." This means understanding the normal appearance and feel of your breasts and may include breast self-examination. Any changes detected, no matter how small, should be reported to a health care provider. Women in their 20s and 30s should have a clinical breast exam (CBE) by a health care provider as part of a regular health exam every 1 to 3 years. After age 40, women should have a CBE every year. Starting at age 40, women should consider having a mammogram (breast X-ray test) every year. Women who have a family history of breast cancer should talk to their health care provider about genetic screening. Women at a high risk of breast cancer should talk to their health care providers about having an MRI and a mammogram every year.  Breast cancer gene (BRCA)-related cancer risk assessment is recommended for women who have family members with BRCA-related cancers. BRCA-related cancers include breast, ovarian, tubal, and peritoneal cancers. Having family members with these cancers may be associated with an increased risk for harmful changes (mutations) in the breast cancer genes BRCA1 and  BRCA2. Results of the assessment will determine the need for genetic counseling and BRCA1 and BRCA2 testing.  Your health care provider may recommend that you be screened regularly for cancer of the pelvic organs (ovaries, uterus, and vagina). This screening involves a pelvic examination, including checking for microscopic changes to the surface of your cervix (Pap test). You may be encouraged to have this screening done every 3 years, beginning at age 21.  For women ages 30-65, health care providers may recommend pelvic exams and Pap testing every 3 years, or they may recommend the Pap and pelvic exam, combined with testing for human papilloma virus (HPV), every 5 years. Some types of HPV increase your risk of cervical cancer. Testing for HPV may also be done on women of any age with unclear Pap test results.  Other health care providers may not recommend any screening for nonpregnant women who are considered low risk for pelvic cancer and who do not have symptoms. Ask your health care provider if a screening pelvic exam is right for   you.  If you have had past treatment for cervical cancer or a condition that could lead to cancer, you need Pap tests and screening for cancer for at least 20 years after your treatment. If Pap tests have been discontinued, your risk factors (such as having a new sexual partner) need to be reassessed to determine if screening should resume. Some women have medical problems that increase the chance of getting cervical cancer. In these cases, your health care provider may recommend more frequent screening and Pap tests.  Colorectal cancer can be detected and often prevented. Most routine colorectal cancer screening begins at the age of 50 years and continues through age 75 years. However, your health care provider may recommend screening at an earlier age if you have risk factors for colon cancer. On a yearly basis, your health care provider may provide home test kits to check  for hidden blood in the stool. Use of a small camera at the end of a tube, to directly examine the colon (sigmoidoscopy or colonoscopy), can detect the earliest forms of colorectal cancer. Talk to your health care provider about this at age 50, when routine screening begins. Direct exam of the colon should be repeated every 5-10 years through age 75 years, unless early forms of precancerous polyps or small growths are found.  People who are at an increased risk for hepatitis B should be screened for this virus. You are considered at high risk for hepatitis B if:  You were born in a country where hepatitis B occurs often. Talk with your health care provider about which countries are considered high risk.  Your parents were born in a high-risk country and you have not received a shot to protect against hepatitis B (hepatitis B vaccine).  You have HIV or AIDS.  You use needles to inject street drugs.  You live with, or have sex with, someone who has hepatitis B.  You get hemodialysis treatment.  You take certain medicines for conditions like cancer, organ transplantation, and autoimmune conditions.  Hepatitis C blood testing is recommended for all people born from 1945 through 1965 and any individual with known risks for hepatitis C.  Practice safe sex. Use condoms and avoid high-risk sexual practices to reduce the spread of sexually transmitted infections (STIs). STIs include gonorrhea, chlamydia, syphilis, trichomonas, herpes, HPV, and human immunodeficiency virus (HIV). Herpes, HIV, and HPV are viral illnesses that have no cure. They can result in disability, cancer, and death.  You should be screened for sexually transmitted illnesses (STIs) including gonorrhea and chlamydia if:  You are sexually active and are younger than 24 years.  You are older than 24 years and your health care provider tells you that you are at risk for this type of infection.  Your sexual activity has changed  since you were last screened and you are at an increased risk for chlamydia or gonorrhea. Ask your health care provider if you are at risk.  If you are at risk of being infected with HIV, it is recommended that you take a prescription medicine daily to prevent HIV infection. This is called preexposure prophylaxis (PrEP). You are considered at risk if:  You are sexually active and do not regularly use condoms or know the HIV status of your partner(s).  You take drugs by injection.  You are sexually active with a partner who has HIV.  Talk with your health care provider about whether you are at high risk of being infected with HIV. If   you choose to begin PrEP, you should first be tested for HIV. You should then be tested every 3 months for as long as you are taking PrEP.  Osteoporosis is a disease in which the bones lose minerals and strength with aging. This can result in serious bone fractures or breaks. The risk of osteoporosis can be identified using a bone density scan. Women ages 67 years and over and women at risk for fractures or osteoporosis should discuss screening with their health care providers. Ask your health care provider whether you should take a calcium supplement or vitamin D to reduce the rate of osteoporosis.  Menopause can be associated with physical symptoms and risks. Hormone replacement therapy is available to decrease symptoms and risks. You should talk to your health care provider about whether hormone replacement therapy is right for you.  Use sunscreen. Apply sunscreen liberally and repeatedly throughout the day. You should seek shade when your shadow is shorter than you. Protect yourself by wearing long sleeves, pants, a wide-brimmed hat, and sunglasses year round, whenever you are outdoors.  Once a month, do a whole body skin exam, using a mirror to look at the skin on your back. Tell your health care provider of new moles, moles that have irregular borders, moles that  are larger than a pencil eraser, or moles that have changed in shape or color.  Stay current with required vaccines (immunizations).  Influenza vaccine. All adults should be immunized every year.  Tetanus, diphtheria, and acellular pertussis (Td, Tdap) vaccine. Pregnant women should receive 1 dose of Tdap vaccine during each pregnancy. The dose should be obtained regardless of the length of time since the last dose. Immunization is preferred during the 27th-36th week of gestation. An adult who has not previously received Tdap or who does not know her vaccine status should receive 1 dose of Tdap. This initial dose should be followed by tetanus and diphtheria toxoids (Td) booster doses every 10 years. Adults with an unknown or incomplete history of completing a 3-dose immunization series with Td-containing vaccines should begin or complete a primary immunization series including a Tdap dose. Adults should receive a Td booster every 10 years.  Varicella vaccine. An adult without evidence of immunity to varicella should receive 2 doses or a second dose if she has previously received 1 dose. Pregnant females who do not have evidence of immunity should receive the first dose after pregnancy. This first dose should be obtained before leaving the health care facility. The second dose should be obtained 4-8 weeks after the first dose.  Human papillomavirus (HPV) vaccine. Females aged 13-26 years who have not received the vaccine previously should obtain the 3-dose series. The vaccine is not recommended for use in pregnant females. However, pregnancy testing is not needed before receiving a dose. If a female is found to be pregnant after receiving a dose, no treatment is needed. In that case, the remaining doses should be delayed until after the pregnancy. Immunization is recommended for any person with an immunocompromised condition through the age of 61 years if she did not get any or all doses earlier. During the  3-dose series, the second dose should be obtained 4-8 weeks after the first dose. The third dose should be obtained 24 weeks after the first dose and 16 weeks after the second dose.  Zoster vaccine. One dose is recommended for adults aged 30 years or older unless certain conditions are present.  Measles, mumps, and rubella (MMR) vaccine. Adults born  before 1957 generally are considered immune to measles and mumps. Adults born in 1957 or later should have 1 or more doses of MMR vaccine unless there is a contraindication to the vaccine or there is laboratory evidence of immunity to each of the three diseases. A routine second dose of MMR vaccine should be obtained at least 28 days after the first dose for students attending postsecondary schools, health care workers, or international travelers. People who received inactivated measles vaccine or an unknown type of measles vaccine during 1963-1967 should receive 2 doses of MMR vaccine. People who received inactivated mumps vaccine or an unknown type of mumps vaccine before 1979 and are at high risk for mumps infection should consider immunization with 2 doses of MMR vaccine. For females of childbearing age, rubella immunity should be determined. If there is no evidence of immunity, females who are not pregnant should be vaccinated. If there is no evidence of immunity, females who are pregnant should delay immunization until after pregnancy. Unvaccinated health care workers born before 1957 who lack laboratory evidence of measles, mumps, or rubella immunity or laboratory confirmation of disease should consider measles and mumps immunization with 2 doses of MMR vaccine or rubella immunization with 1 dose of MMR vaccine.  Pneumococcal 13-valent conjugate (PCV13) vaccine. When indicated, a person who is uncertain of his immunization history and has no record of immunization should receive the PCV13 vaccine. All adults 65 years of age and older should receive this  vaccine. An adult aged 19 years or older who has certain medical conditions and has not been previously immunized should receive 1 dose of PCV13 vaccine. This PCV13 should be followed with a dose of pneumococcal polysaccharide (PPSV23) vaccine. Adults who are at high risk for pneumococcal disease should obtain the PPSV23 vaccine at least 8 weeks after the dose of PCV13 vaccine. Adults older than 75 years of age who have normal immune system function should obtain the PPSV23 vaccine dose at least 1 year after the dose of PCV13 vaccine.  Pneumococcal polysaccharide (PPSV23) vaccine. When PCV13 is also indicated, PCV13 should be obtained first. All adults aged 65 years and older should be immunized. An adult younger than age 65 years who has certain medical conditions should be immunized. Any person who resides in a nursing home or long-term care facility should be immunized. An adult smoker should be immunized. People with an immunocompromised condition and certain other conditions should receive both PCV13 and PPSV23 vaccines. People with human immunodeficiency virus (HIV) infection should be immunized as soon as possible after diagnosis. Immunization during chemotherapy or radiation therapy should be avoided. Routine use of PPSV23 vaccine is not recommended for American Indians, [place] Natives, or people younger than 65 years unless there are medical conditions that require PPSV23 vaccine. When indicated, people who have unknown immunization and have no record of immunization should receive PPSV23 vaccine. One-time revaccination 5 years after the first dose of PPSV23 is recommended for people aged 19-64 years who have chronic kidney failure, nephrotic syndrome, asplenia, or immunocompromised conditions. People who received 1-2 doses of PPSV23 before age 65 years should receive another dose of PPSV23 vaccine at age 65 years or later if at least 5 years have passed since the previous dose. Doses of PPSV23 are not  needed for people immunized with PPSV23 at or after age 65 years.  Meningococcal vaccine. Adults with asplenia or persistent complement component deficiencies should receive 2 doses of quadrivalent meningococcal conjugate (MenACWY-D) vaccine. The doses should be obtained   at least 2 months apart. Microbiologists working with certain meningococcal bacteria, [place] recruits, people at risk during an outbreak, and people who travel to or live in countries with a high rate of meningitis should be immunized. A first-year college student up through age 34 years who is living in a residence hall should receive a dose if she did not receive a dose on or after her 16th birthday. Adults who have certain high-risk conditions should receive one or more doses of vaccine.  Hepatitis A vaccine. Adults who wish to be protected from this disease, have certain high-risk conditions, work with hepatitis A-infected animals, work in hepatitis A research labs, or travel to or work in countries with a high rate of hepatitis A should be immunized. Adults who were previously unvaccinated and who anticipate close contact with an international adoptee during the first 60 days after arrival in the [place] from a country with a high rate of hepatitis A should be immunized.  Hepatitis B vaccine. Adults who wish to be protected from this disease, have certain high-risk conditions, may be exposed to blood or other infectious body fluids, are household contacts or sex partners of hepatitis B positive people, are clients or workers in certain care facilities, or travel to or work in countries with a high rate of hepatitis B should be immunized.  Haemophilus influenzae type b (Hib) vaccine. A previously unvaccinated person with asplenia or sickle cell disease or having a scheduled splenectomy should receive 1 dose of Hib vaccine. Regardless of previous immunization, a recipient of a hematopoietic stem cell transplant should receive a  3-dose series 6-12 months after her successful transplant. Hib vaccine is not recommended for adults with HIV infection. Preventive Services / Frequency Ages 35 to 4 years  Blood pressure check.** / Every 3-5 years.  Lipid and cholesterol check.** / Every 5 years beginning at age 60.  Clinical breast exam.** / Every 3 years for women in their 71s and 10s.  BRCA-related cancer risk assessment.** / For women who have family members with a BRCA-related cancer (breast, ovarian, tubal, or peritoneal cancers).  Pap test.** / Every 2 years from ages 76 through 26. Every 3 years starting at age 61 through age 76 or 93 with a history of 3 consecutive normal Pap tests.  HPV screening.** / Every 3 years from ages 37 through ages 60 to 51 with a history of 3 consecutive normal Pap tests.  Hepatitis C blood test.** / For any individual with known risks for hepatitis C.  Skin self-exam. / Monthly.  Influenza vaccine. / Every year.  Tetanus, diphtheria, and acellular pertussis (Tdap, Td) vaccine.** / Consult your health care provider. Pregnant women should receive 1 dose of Tdap vaccine during each pregnancy. 1 dose of Td every 10 years.  Varicella vaccine.** / Consult your health care provider. Pregnant females who do not have evidence of immunity should receive the first dose after pregnancy.  HPV vaccine. / 3 doses over 6 months, if 93 and younger. The vaccine is not recommended for use in pregnant females. However, pregnancy testing is not needed before receiving a dose.  Measles, mumps, rubella (MMR) vaccine.** / You need at least 1 dose of MMR if you were born in 1957 or later. You may also need a 2nd dose. For females of childbearing age, rubella immunity should be determined. If there is no evidence of immunity, females who are not pregnant should be vaccinated. If there is no evidence of immunity, females who are  pregnant should delay immunization until after pregnancy.  Pneumococcal  13-valent conjugate (PCV13) vaccine.** / Consult your health care provider.  Pneumococcal polysaccharide (PPSV23) vaccine.** / 1 to 2 doses if you smoke cigarettes or if you have certain conditions.  Meningococcal vaccine.** / 1 dose if you are age 68 to 8 years and a Market researcher living in a residence hall, or have one of several medical conditions, you need to get vaccinated against meningococcal disease. You may also need additional booster doses.  Hepatitis A vaccine.** / Consult your health care provider.  Hepatitis B vaccine.** / Consult your health care provider.  Haemophilus influenzae type b (Hib) vaccine.** / Consult your health care provider. Ages 7 to 53 years  Blood pressure check.** / Every year.  Lipid and cholesterol check.** / Every 5 years beginning at age 25 years.  Lung cancer screening. / Every year if you are aged 11-80 years and have a 30-pack-year history of smoking and currently smoke or have quit within the past 15 years. Yearly screening is stopped once you have quit smoking for at least 15 years or develop a health problem that would prevent you from having lung cancer treatment.  Clinical breast exam.** / Every year after age 48 years.  BRCA-related cancer risk assessment.** / For women who have family members with a BRCA-related cancer (breast, ovarian, tubal, or peritoneal cancers).  Mammogram.** / Every year beginning at age 41 years and continuing for as long as you are in good health. Consult with your health care provider.  Pap test.** / Every 3 years starting at age 65 years through age 37 or 70 years with a history of 3 consecutive normal Pap tests.  HPV screening.** / Every 3 years from ages 72 years through ages 60 to 40 years with a history of 3 consecutive normal Pap tests.  Fecal occult blood test (FOBT) of stool. / Every year beginning at age 21 years and continuing until age 5 years. You may not need to do this test if you get  a colonoscopy every 10 years.  Flexible sigmoidoscopy or colonoscopy.** / Every 5 years for a flexible sigmoidoscopy or every 10 years for a colonoscopy beginning at age 35 years and continuing until age 48 years.  Hepatitis C blood test.** / For all people born from [birth year] through 1965 and any individual with known risks for hepatitis C.  Skin self-exam. / Monthly.  Influenza vaccine. / Every year.  Tetanus, diphtheria, and acellular pertussis (Tdap/Td) vaccine.** / Consult your health care provider. Pregnant women should receive 1 dose of Tdap vaccine during each pregnancy. 1 dose of Td every 10 years.  Varicella vaccine.** / Consult your health care provider. Pregnant females who do not have evidence of immunity should receive the first dose after pregnancy.  Zoster vaccine.** / 1 dose for adults aged 30 years or older.  Measles, mumps, rubella (MMR) vaccine.** / You need at least 1 dose of MMR if you were born in 1957 or later. You may also need a second dose. For females of childbearing age, rubella immunity should be determined. If there is no evidence of immunity, females who are not pregnant should be vaccinated. If there is no evidence of immunity, females who are pregnant should delay immunization until after pregnancy.  Pneumococcal 13-valent conjugate (PCV13) vaccine.** / Consult your health care provider.  Pneumococcal polysaccharide (PPSV23) vaccine.** / 1 to 2 doses if you smoke cigarettes or if you have certain conditions.  Meningococcal vaccine.** /  Consult your health care provider.  Hepatitis A vaccine.** / Consult your health care provider.  Hepatitis B vaccine.** / Consult your health care provider.  Haemophilus influenzae type b (Hib) vaccine.** / Consult your health care provider. Ages 64 years and over  Blood pressure check.** / Every year.  Lipid and cholesterol check.** / Every 5 years beginning at age 23 years.  Lung cancer screening. / Every year if you  are aged 16-80 years and have a 30-pack-year history of smoking and currently smoke or have quit within the past 15 years. Yearly screening is stopped once you have quit smoking for at least 15 years or develop a health problem that would prevent you from having lung cancer treatment.  Clinical breast exam.** / Every year after age 74 years.  BRCA-related cancer risk assessment.** / For women who have family members with a BRCA-related cancer (breast, ovarian, tubal, or peritoneal cancers).  Mammogram.** / Every year beginning at age 44 years and continuing for as long as you are in good health. Consult with your health care provider.  Pap test.** / Every 3 years starting at age 58 years through age 22 or 39 years with 3 consecutive normal Pap tests. Testing can be stopped between 65 and 70 years with 3 consecutive normal Pap tests and no abnormal Pap or HPV tests in the past 10 years.  HPV screening.** / Every 3 years from ages 64 years through ages 70 or 61 years with a history of 3 consecutive normal Pap tests. Testing can be stopped between 65 and 70 years with 3 consecutive normal Pap tests and no abnormal Pap or HPV tests in the past 10 years.  Fecal occult blood test (FOBT) of stool. / Every year beginning at age 40 years and continuing until age 27 years. You may not need to do this test if you get a colonoscopy every 10 years.  Flexible sigmoidoscopy or colonoscopy.** / Every 5 years for a flexible sigmoidoscopy or every 10 years for a colonoscopy beginning at age 7 years and continuing until age 32 years.  Hepatitis C blood test.** / For all people born from [birth year] through 1965 and any individual with known risks for hepatitis C.  Osteoporosis screening.** / A one-time screening for women ages 30 years and over and women at risk for fractures or osteoporosis.  Skin self-exam. / Monthly.  Influenza vaccine. / Every year.  Tetanus, diphtheria, and acellular pertussis (Tdap/Td)  vaccine.** / 1 dose of Td every 10 years.  Varicella vaccine.** / Consult your health care provider.  Zoster vaccine.** / 1 dose for adults aged 35 years or older.  Pneumococcal 13-valent conjugate (PCV13) vaccine.** / Consult your health care provider.  Pneumococcal polysaccharide (PPSV23) vaccine.** / 1 dose for all adults aged 46 years and older.  Meningococcal vaccine.** / Consult your health care provider.  Hepatitis A vaccine.** / Consult your health care provider.  Hepatitis B vaccine.** / Consult your health care provider.  Haemophilus influenzae type b (Hib) vaccine.** / Consult your health care provider. ** Family history and personal history of risk and conditions may change your health care provider's recommendations.   This information is not intended to replace advice given to you by your health care provider. Make sure you discuss any questions you have with your health care provider.   Document Released: 08/18/2001 Document Revised: 07/13/2014 Document Reviewed: 11/17/2010 Elsevier Interactive Patient Education [employer].

## 2016-03-07 LAB — URINE CULTURE: Organism ID, Bacteria: 10000

## 2016-03-17 ENCOUNTER — Other Ambulatory Visit: Payer: Self-pay | Admitting: Family Medicine

## 2016-03-21 ENCOUNTER — Encounter: Payer: Self-pay | Admitting: Family Medicine

## 2016-03-22 ENCOUNTER — Encounter: Payer: Self-pay | Admitting: Family Medicine

## 2016-03-22 NOTE — Assessment & Plan Note (Signed)
Avoid offending foods, start probiotics. Do not eat large meals in late evening and consider raising head of bed.  

## 2016-03-22 NOTE — Assessment & Plan Note (Signed)
Triamcinolone prn and if no improvement willneed to visit dermatology

## 2016-03-22 NOTE — Assessment & Plan Note (Signed)
Well controlled, no changes to meds. Encouraged heart healthy diet such as the DASH diet and exercise as tolerated.  °

## 2016-03-22 NOTE — Assessment & Plan Note (Signed)
Levels within normal limits with labwork maintain daily supplements

## 2016-03-22 NOTE — Progress Notes (Signed)
Patient ID: Felicia Lowe, female   DOB: September 21, 1940, 75 y.o.   MRN: [id number]   Subjective:    Patient ID: Felicia Lowe, female    DOB: [birth year], 75 y.o.   MRN: [id number]  Chief Complaint  Patient presents with  . Annual Exam    HPI Patient is in today for annual preventative physical exam and follow up on numerous medical concerns. She feels well today. No recent hospitalization but she did have a recent UTI and rash. Her urinary symptoms are largely better, only frequency noted. Rash onlegs improving but still some lesions left. Denies CP/palp/SOB/HA/congestion/fevers/GI c/o. Taking meds as prescribed. Doing well with ADLs at home.   Past Medical History:  Diagnosis Date  . Allergy   . Arthritis    osteoarthritis, severe, b/l knees &b/l hips  replaced  . BPV (benign positional vertigo) 02/19/2013   mild  . Chicken pox as a child  . GERD (gastroesophageal reflux disease) 05/27/2014  . H/O breast biopsy    benign, on left  . Hypertension   . Kidney stone 2000   nephritis as a child  . Measles as a child  . Mumps as a child  . Obesity   . Osteoporosis   . Preventative health care 11/25/2014  . Skin lesion of face 05/18/2012  . Thyroid disease   . Unsteady gait 11/25/2014  . UTI (urinary tract infection) 12/11/2012  . Viral respiratory illness 05/21/2013  . Vitamin D deficiency     Past Surgical History:  Procedure Laterality Date  . HIP SURGERY     both hips  . KNEE SURGERY     both knees replaced    Family History  Problem Relation Age of Onset  . Hyperlipidemia Mother   . Hypertension Mother   . Heart attack Mother   . Cancer Father     lung- smoker  . Heart attack Father     X 2  . Thyroid disease Brother   . Hypertension Son   . Cancer Paternal Grandmother     Social History   Social History  . Marital status: Unknown    Spouse name: N/A  . Number of children: N/A  . Years of education: N/A   Occupational History  . Not on file.   Social  History Main Topics  . Smoking status: Never Smoker  . Smokeless tobacco: Never Used  . Alcohol use Yes     Comment: Occasional glass of wine  . Drug use: No  . Sexual activity: No   Other Topics Concern  . Not on file   Social History Narrative  . No narrative on file    Outpatient Medications Prior to Visit  Medication Sig Dispense Refill  . albuterol (PROAIR HFA) 108 (90 Base) MCG/ACT inhaler Inhale 2 puffs into the lungs every 4 (four) hours as needed for wheezing or shortness of breath. (Patient not taking: Reported on 01/21/2016) 1 Inhaler 0  . Ascorbic Acid (VITAMIN C) 1000 MG tablet Take 1,000 mg by mouth daily.    Marland Kitchen aspirin 81 MG tablet Take 81 mg by mouth daily.    . Cholecalciferol (VITAMIN D) 2000 UNITS tablet Take 2,000 Units by mouth daily.    . ciprofloxacin (CIPRO) 250 MG tablet Take 1 tablet (250 mg total) by mouth 2 (two) times daily. 10 tablet 0  . Coenzyme Q10 (CO Q 10) 100 MG CAPS Take 400 mg by mouth daily.    . furosemide (LASIX) 20 MG tablet TAKE 1 TABLET (  20 MG TOTAL) BY MOUTH DAILY AS NEEDED. 30 tablet 4  . ketoconazole (NIZORAL) 2 % cream Apply 1 application topically daily. 15 g 0  . lisinopril-hydrochlorothiazide (PRINZIDE,ZESTORETIC) 10-12.5 MG tablet TAKE 1 TABLET BY MOUTH DAILY. (Patient taking differently: TAKE ONE TABLET BY MOUTH DAILY.Marland KitchenTAKES SIX DAYS A WEEK PER PATIENT) 90 tablet 1  . nystatin (MYCOSTATIN/NYSTOP) 100000 UNIT/GM POWD 1 tsp. Po tid X 7 days (Patient taking differently: No sig reported) 60 g 3  . Omega-3 Fatty Acids (FISH OIL) 1000 MG CPDR Take 1 capsule by mouth daily.    . Probiotic Product (PROBIOTIC DAILY PO) Take by mouth.    . triamcinolone cream (KENALOG) 0.1 % Apply 1 application topically 2 (two) times daily. 85.2 g 3  . SYNTHROID 150 MCG tablet TAKE 1 TABLET (150 MCG TOTAL) BY MOUTH DAILY BEFORE BREAKFAST. 30 tablet 6   No facility-administered medications prior to visit.     Allergies  Allergen Reactions  . Eggs Or  Egg-Derived Products   . Keflex [Cephalexin]     Rash   . Sulfa Antibiotics Nausea And Vomiting  . Penicillins Rash  . Poison Ivy Extract [Poison Ivy Extract] Rash    Review of Systems  Constitutional: Positive for malaise/fatigue. Negative for fever.  HENT: Negative for congestion.   Eyes: Negative for blurred vision.  Respiratory: Negative for shortness of breath.   Cardiovascular: Positive for claudication. Negative for chest pain, palpitations and leg swelling.  Gastrointestinal: Positive for heartburn. Negative for abdominal pain, blood in stool and nausea.  Genitourinary: Negative for dysuria and frequency.  Musculoskeletal: Negative for falls.  Skin: Positive for rash.  Neurological: Negative for dizziness, loss of consciousness and headaches.  Endo/Heme/Allergies: Negative for environmental allergies.  Psychiatric/Behavioral: Negative for depression. The patient has insomnia. The patient is not nervous/anxious.        Objective:    Physical Exam  Constitutional: She is oriented to person, place, and time. She appears well-developed and well-nourished. No distress.  HENT:  Head: Normocephalic and atraumatic.  Eyes: Conjunctivae are normal.  Neck: Neck supple. No thyromegaly present.  Cardiovascular: Normal rate, regular rhythm and normal heart sounds.   Pulmonary/Chest: Effort normal and breath sounds normal. No respiratory distress.  Abdominal: Soft. Bowel sounds are normal. She exhibits no distension and no mass. There is no tenderness.  Musculoskeletal: She exhibits no edema.  Lymphadenopathy:    She has no cervical adenopathy.  Neurological: She is alert and oriented to person, place, and time.  Skin: Skin is warm and dry. Rash noted.  Scattered erythematous maculopapular lesions on legs.   Psychiatric: She has a normal mood and affect. Her behavior is normal.    BP (!) 116/58 (BP Location: Left Arm, Patient Position: Sitting, Cuff Size: Normal)   Pulse 86    Temp 98 F (36.7 C) (Oral)   Wt 193 lb 3.2 oz (87.6 kg)   SpO2 97%   BMI 35.34 kg/m  Wt Readings from Last 3 Encounters:  03/06/16 193 lb 3.2 oz (87.6 kg)  01/21/16 194 lb 2 oz (88.1 kg)  11/07/15 194 lb 12 oz (88.3 kg)     Lab Results  Component Value Date   WBC 7.5 03/06/2016   HGB 14.5 03/06/2016   HCT 43.0 03/06/2016   PLT 276.0 03/06/2016   GLUCOSE 93 03/06/2016   CHOL 145 02/16/2013   TRIG 117 02/16/2013   HDL 46 02/16/2013   LDLCALC 76 02/16/2013   ALT 11 03/06/2016   AST 12 03/06/2016  NA 139 03/06/2016   K 4.1 03/06/2016   CL 104 03/06/2016   CREATININE 0.87 03/06/2016   BUN 13 03/06/2016   CO2 30 03/06/2016   TSH 0.28 (L) 03/06/2016    Lab Results  Component Value Date   TSH 0.28 (L) 03/06/2016   Lab Results  Component Value Date   WBC 7.5 03/06/2016   HGB 14.5 03/06/2016   HCT 43.0 03/06/2016   MCV 88.8 03/06/2016   PLT 276.0 03/06/2016   Lab Results  Component Value Date   NA 139 03/06/2016   K 4.1 03/06/2016   CO2 30 03/06/2016   GLUCOSE 93 03/06/2016   BUN 13 03/06/2016   CREATININE 0.87 03/06/2016   BILITOT 0.4 03/06/2016   ALKPHOS 64 03/06/2016   AST 12 03/06/2016   ALT 11 03/06/2016   PROT 7.1 03/06/2016   ALBUMIN 4.1 03/06/2016   CALCIUM 9.1 03/06/2016   GFR 67.45 03/06/2016   Lab Results  Component Value Date   CHOL 145 02/16/2013   Lab Results  Component Value Date   HDL 46 02/16/2013   Lab Results  Component Value Date   LDLCALC 76 02/16/2013   Lab Results  Component Value Date   TRIG 117 02/16/2013   Lab Results  Component Value Date   CHOLHDL 3.2 02/16/2013   No results found for: HGBA1C     Assessment & Plan:   Problem List Items Addressed This Visit    Thyroid disease    On Levothyroxine, continue to monitor      Relevant Orders   TSH (Completed)   Hypertension    Well controlled, no changes to meds. Encouraged heart healthy diet such as the DASH diet and exercise as tolerated.       Relevant  Orders   TSH (Completed)   CBC (Completed)   Comprehensive metabolic panel (Completed)   Vitamin D deficiency - Primary    Levels within normal limits with labwork maintain daily supplements      Relevant Orders   VITAMIN D 25 Hydroxy (Vit-D Deficiency, Fractures) (Completed)   DG Bone Density   Obesity    Encouraged DASH diet, decrease po intake and increase exercise as tolerated. Needs 7-8 hours of sleep nightly. Avoid trans fats, eat small, frequent meals every 4-5 hours with lean proteins, complex carbs and healthy fats. Minimize simple carbs      UTI (urinary tract infection)    Repeat urine not infected, feeling better.       Relevant Orders   Urinalysis   Urine culture (Completed)   Dermatitis    Triamcinolone prn and if no improvement willneed to visit dermatology      GERD (gastroesophageal reflux disease)    Avoid offending foods, start probiotics. Do not eat large meals in late evening and consider raising head of bed.       Relevant Orders   CBC (Completed)   Preventative health care    Patient encouraged to maintain heart healthy diet, regular exercise, adequate sleep. Consider daily probiotics. Take medications as prescribed. Given and reviewed copy of ACP documents from [employer] and encouraged to complete and return       Other Visit Diagnoses    Estrogen deficiency       Relevant Orders   DG Bone Density   Need for vaccination with 13-polyvalent pneumococcal conjugate vaccine       Relevant Orders   Pneumococcal conjugate vaccine 13-valent (Completed)   Tetanus-diphtheria (Td) vaccination  Relevant Orders   Td : Tetanus/diphtheria >7yo Preservative  free (Completed)      I am having Ms. Fogleman maintain her vitamin C, Vitamin D, Co Q 10, Fish Oil, aspirin, Probiotic Product (PROBIOTIC DAILY PO), nystatin, furosemide, albuterol, lisinopril-hydrochlorothiazide, triamcinolone cream, ciprofloxacin, and ketoconazole.  No orders of the  defined types were placed in this encounter.    Penni Homans, MD

## 2016-03-22 NOTE — Assessment & Plan Note (Signed)
Repeat urine not infected, feeling better.

## 2016-03-26 ENCOUNTER — Other Ambulatory Visit (HOSPITAL_BASED_OUTPATIENT_CLINIC_OR_DEPARTMENT_OTHER): Payer: Medicare HMO

## 2016-04-21 ENCOUNTER — Other Ambulatory Visit: Payer: Self-pay | Admitting: Physician Assistant

## 2016-04-21 DIAGNOSIS — B359 Dermatophytosis, unspecified: Secondary | ICD-10-CM

## 2016-05-04 ENCOUNTER — Encounter: Payer: Self-pay | Admitting: Family Medicine

## 2016-05-04 ENCOUNTER — Other Ambulatory Visit: Payer: Self-pay | Admitting: Family Medicine

## 2016-05-04 MED ORDER — FUROSEMIDE 20 MG PO TABS: 20.0000 mg | ORAL_TABLET | Freq: Every day | ORAL | 4 refills | Status: DC | PRN

## 2016-05-15 ENCOUNTER — Ambulatory Visit (INDEPENDENT_AMBULATORY_CARE_PROVIDER_SITE_OTHER): Payer: Medicare HMO | Admitting: Behavioral Health

## 2016-05-15 DIAGNOSIS — Z23 Encounter for immunization: Secondary | ICD-10-CM | POA: Diagnosis not present

## 2016-06-03 ENCOUNTER — Encounter: Payer: Self-pay | Admitting: Family Medicine

## 2016-06-04 ENCOUNTER — Ambulatory Visit: Payer: Medicare HMO | Admitting: Family Medicine

## 2016-06-04 ENCOUNTER — Other Ambulatory Visit: Payer: Self-pay | Admitting: Family Medicine

## 2016-06-04 DIAGNOSIS — E669 Obesity, unspecified: Secondary | ICD-10-CM | POA: Diagnosis not present

## 2016-06-04 DIAGNOSIS — M169 Osteoarthritis of hip, unspecified: Secondary | ICD-10-CM | POA: Diagnosis not present

## 2016-06-04 DIAGNOSIS — M179 Osteoarthritis of knee, unspecified: Secondary | ICD-10-CM | POA: Diagnosis not present

## 2016-06-04 DIAGNOSIS — E559 Vitamin D deficiency, unspecified: Secondary | ICD-10-CM | POA: Diagnosis not present

## 2016-06-04 MED ORDER — LISINOPRIL-HYDROCHLOROTHIAZIDE 10-12.5 MG PO TABS: 1.0000 | ORAL_TABLET | Freq: Every day | ORAL | 1 refills | Status: DC

## 2016-07-10 ENCOUNTER — Encounter: Payer: Self-pay | Admitting: Family Medicine

## 2016-07-10 ENCOUNTER — Ambulatory Visit (INDEPENDENT_AMBULATORY_CARE_PROVIDER_SITE_OTHER): Payer: Medicare HMO | Admitting: Family Medicine

## 2016-07-10 DIAGNOSIS — B359 Dermatophytosis, unspecified: Secondary | ICD-10-CM

## 2016-07-10 DIAGNOSIS — I10 Essential (primary) hypertension: Secondary | ICD-10-CM

## 2016-07-10 DIAGNOSIS — E559 Vitamin D deficiency, unspecified: Secondary | ICD-10-CM

## 2016-07-10 DIAGNOSIS — E079 Disorder of thyroid, unspecified: Secondary | ICD-10-CM | POA: Diagnosis not present

## 2016-07-10 DIAGNOSIS — E6609 Other obesity due to excess calories: Secondary | ICD-10-CM

## 2016-07-10 DIAGNOSIS — L304 Erythema intertrigo: Secondary | ICD-10-CM

## 2016-07-10 LAB — CBC
HCT: 42.2 % (ref 36.0–46.0)
Hemoglobin: 14.2 g/dL (ref 12.0–15.0)
MCHC: 33.7 g/dL (ref 30.0–36.0)
MCV: 88 fl (ref 78.0–100.0)
PLATELETS: 282 10*3/uL (ref 150.0–400.0)
RBC: 4.8 Mil/uL (ref 3.87–5.11)
RDW: 14.1 % (ref 11.5–15.5)
WBC: 6.8 10*3/uL (ref 4.0–10.5)

## 2016-07-10 LAB — COMPREHENSIVE METABOLIC PANEL
ALT: 13 U/L (ref 0–35)
AST: 14 U/L (ref 0–37)
Albumin: 4.1 g/dL (ref 3.5–5.2)
Alkaline Phosphatase: 58 U/L (ref 39–117)
BUN: 16 mg/dL (ref 6–23)
CALCIUM: 9.7 mg/dL (ref 8.4–10.5)
CHLORIDE: 107 meq/L (ref 96–112)
CO2: 29 meq/L (ref 19–32)
CREATININE: 0.74 mg/dL (ref 0.40–1.20)
GFR: 81.23 mL/min (ref >60.00)
GLUCOSE: 93 mg/dL (ref 70–99)
Potassium: 4.3 mEq/L (ref 3.5–5.1)
SODIUM: 141 meq/L (ref 135–145)
Total Bilirubin: 0.3 mg/dL (ref 0.2–1.2)
Total Protein: 7.1 g/dL (ref 6.0–8.3)

## 2016-07-10 LAB — VITAMIN D 25 HYDROXY (VIT D DEFICIENCY, FRACTURES): VITD: 37.25 ng/mL (ref 30.00–100.00)

## 2016-07-10 LAB — TSH: TSH: 0.41 u[IU]/mL (ref 0.35–4.50)

## 2016-07-10 MED ORDER — SYNTHROID 150 MCG PO TABS
ORAL_TABLET | ORAL | 4 refills | Status: DC
Start: 2016-07-10 — End: 2017-02-03

## 2016-07-10 MED ORDER — KETOCONAZOLE 2 % EX CREA: 1.0000 "application " | TOPICAL_CREAM | Freq: Every day | CUTANEOUS | 0 refills | Status: DC

## 2016-07-10 NOTE — Patient Instructions (Signed)
Hypertension Hypertension, commonly called high blood pressure, is when the force of blood pumping through your arteries is too strong. Your arteries are the blood vessels that carry blood from your heart throughout your body. A blood pressure reading consists of a higher number over a lower number, such as 110/72. The higher number (systolic) is the pressure inside your arteries when your heart pumps. The lower number (diastolic) is the pressure inside your arteries when your heart relaxes. Ideally you want your blood pressure below 120/80. Hypertension forces your heart to work harder to pump blood. Your arteries may become narrow or stiff. Having untreated or uncontrolled hypertension can cause heart attack, stroke, kidney disease, and other problems. What increases the risk? Some risk factors for high blood pressure are controllable. Others are not. Risk factors you cannot control include:  Race. You may be at higher risk if you are African American.  Age. Risk increases with age.  Gender. Men are at higher risk than women before age 45 years. After age 65, women are at higher risk than men. Risk factors you can control include:  Not getting enough exercise or physical activity.  Being overweight.  Getting too much fat, sugar, calories, or salt in your diet.  Drinking too much alcohol. What are the signs or symptoms? Hypertension does not usually cause signs or symptoms. Extremely high blood pressure (hypertensive crisis) may cause headache, anxiety, shortness of breath, and nosebleed. How is this diagnosed? To check if you have hypertension, your health care provider will measure your blood pressure while you are seated, with your arm held at the level of your heart. It should be measured at least twice using the same arm. Certain conditions can cause a difference in blood pressure between your right and left arms. A blood pressure reading that is higher than normal on one occasion does  not mean that you need treatment. If it is not clear whether you have high blood pressure, you may be asked to return on a different day to have your blood pressure checked again. Or, you may be asked to monitor your blood pressure at home for 1 or more weeks. How is this treated? Treating high blood pressure includes making lifestyle changes and possibly taking medicine. Living a healthy lifestyle can help lower high blood pressure. You may need to change some of your habits. Lifestyle changes may include:  Following the DASH diet. This diet is high in fruits, vegetables, and whole grains. It is low in salt, red meat, and added sugars.  Keep your sodium intake below 2,300 mg per day.  Getting at least 30-45 minutes of aerobic exercise at least 4 times per week.  Losing weight if necessary.  Not smoking.  Limiting alcoholic beverages.  Learning ways to reduce stress. Your health care provider may prescribe medicine if lifestyle changes are not enough to get your blood pressure under control, and if one of the following is true:  You are 18-59 years of age and your systolic blood pressure is above 140.  You are 60 years of age or older, and your systolic blood pressure is above 150.  Your diastolic blood pressure is above 90.  You have diabetes, and your systolic blood pressure is over 140 or your diastolic blood pressure is over 90.  You have kidney disease and your blood pressure is above 140/90.  You have heart disease and your blood pressure is above 140/90. Your personal target blood pressure may vary depending on your medical   conditions, your age, and other factors. Follow these instructions at home:  Have your blood pressure rechecked as directed by your health care provider.  Take medicines only as directed by your health care provider. Follow the directions carefully. Blood pressure medicines must be taken as prescribed. The medicine does not work as well when you skip  doses. Skipping doses also puts you at risk for problems.  Do not smoke.  Monitor your blood pressure at home as directed by your health care provider. Contact a health care provider if:  You think you are having a reaction to medicines taken.  You have recurrent headaches or feel dizzy.  You have swelling in your ankles.  You have trouble with your vision. Get help right away if:  You develop a severe headache or confusion.  You have unusual weakness, numbness, or feel faint.  You have severe chest or abdominal pain.  You vomit repeatedly.  You have trouble breathing. This information is not intended to replace advice given to you by your health care provider. Make sure you discuss any questions you have with your health care provider. Document Released: 06/22/2005 Document Revised: 11/28/2015 Document Reviewed: 04/14/2013 Elsevier Interactive Patient Education  2017 [employer]  

## 2016-07-10 NOTE — Progress Notes (Signed)
Patient ID: Felicia Lowe, female   DOB: 12-Dec-1940, 76 y.o.   MRN: [id number]

## 2016-07-10 NOTE — Progress Notes (Signed)
Pre visit review using our clinic review tool, if applicable. No additional management support is needed unless otherwise documented below in the visit note. 

## 2016-07-10 NOTE — Assessment & Plan Note (Signed)
On Levothyroxine, continue to monitor 

## 2016-07-10 NOTE — Assessment & Plan Note (Addendum)
Check level today, maintain daily supplement

## 2016-07-10 NOTE — Progress Notes (Signed)
Subjective:    Patient ID: Felicia Lowe, female    DOB: October 18, 1940, 76 y.o.   MRN: [id number]  No chief complaint on file.   HPI Patient is in today for follow up. No acute concerns noted. She has lost a sibling and a brother in law in past few months but feels she is managing well. Struggles with chronic low back and joint pain as welll as fatigue. No recent illness or hospitalizations. She tries to remain active but is limited by her pain. Denies CP/palp/SOB/HA/congestion/fevers/GI or GU c/o. Taking meds as prescribed  Past Medical History:  Diagnosis Date  . Allergy   . Arthritis    osteoarthritis, severe, b/l knees &b/l hips  replaced  . BPV (benign positional vertigo) 02/19/2013   mild  . Chicken pox as a child  . GERD (gastroesophageal reflux disease) 05/27/2014  . H/O breast biopsy    benign, on left  . Hypertension   . Kidney stone 2000   nephritis as a child  . Measles as a child  . Mumps as a child  . Obesity   . Osteoporosis   . Preventative health care 11/25/2014  . Skin lesion of face 05/18/2012  . Thyroid disease   . Unsteady gait 11/25/2014  . UTI (urinary tract infection) 12/11/2012  . Viral respiratory illness 05/21/2013  . Vitamin D deficiency     Past Surgical History:  Procedure Laterality Date  . HIP SURGERY     both hips  . KNEE SURGERY     both knees replaced    Family History  Problem Relation Age of Onset  . Hyperlipidemia Mother   . Hypertension Mother   . Heart attack Mother   . Cancer Father     lung- smoker  . Heart attack Father     X 2  . Thyroid disease Brother   . Hypertension Son   . Cancer Paternal Grandmother     Social History   Social History  . Marital status: Unknown    Spouse name: N/A  . Number of children: N/A  . Years of education: N/A   Occupational History  . Not on file.   Social History Main Topics  . Smoking status: Never Smoker  . Smokeless tobacco: Never Used  . Alcohol use Yes     Comment:  Occasional glass of wine  . Drug use: No  . Sexual activity: No   Other Topics Concern  . Not on file   Social History Narrative  . No narrative on file    Outpatient Medications Prior to Visit  Medication Sig Dispense Refill  . Ascorbic Acid (VITAMIN C) 1000 MG tablet Take 1,000 mg by mouth daily.    Marland Kitchen aspirin 81 MG tablet Take 81 mg by mouth daily.    . Cholecalciferol (VITAMIN D) 2000 UNITS tablet Take 2,000 Units by mouth daily.    . Coenzyme Q10 (CO Q 10) 100 MG CAPS Take 400 mg by mouth daily.    . furosemide (LASIX) 20 MG tablet Take 1 tablet (20 mg total) by mouth daily as needed. 30 tablet 4  . lisinopril-hydrochlorothiazide (PRINZIDE,ZESTORETIC) 10-12.5 MG tablet Take 1 tablet by mouth daily. 90 tablet 1  . Omega-3 Fatty Acids (FISH OIL) 1000 MG CPDR Take 1 capsule by mouth daily.    . Probiotic Product (PROBIOTIC DAILY PO) Take by mouth.    . triamcinolone cream (KENALOG) 0.1 % Apply 1 application topically 2 (two) times daily. 85.2 g 3  .  ketoconazole (NIZORAL) 2 % cream APPLY 1 APPLICATION TOPICALLY DAILY. 30 g 0  . SYNTHROID 150 MCG tablet TAKE 1 TABLET (150 MCG TOTAL) BY MOUTH DAILY BEFORE BREAKFAST. 30 tablet 4  . albuterol (PROAIR HFA) 108 (90 Base) MCG/ACT inhaler Inhale 2 puffs into the lungs every 4 (four) hours as needed for wheezing or shortness of breath. (Patient not taking: Reported on 07/10/2016) 1 Inhaler 0  . nystatin (MYCOSTATIN/NYSTOP) 100000 UNIT/GM POWD 1 tsp. Po tid X 7 days (Patient not taking: Reported on 07/10/2016) 60 g 3  . ciprofloxacin (CIPRO) 250 MG tablet Take 1 tablet (250 mg total) by mouth 2 (two) times daily. (Patient not taking: Reported on 07/10/2016) 10 tablet 0   No facility-administered medications prior to visit.     Allergies  Allergen Reactions  . Eggs Or Egg-Derived Products   . Keflex [Cephalexin]     Rash   . Sulfa Antibiotics Nausea And Vomiting  . Penicillins Rash  . Poison Ivy Extract [Poison Ivy Extract] Rash    Review  of Systems  Constitutional: Positive for malaise/fatigue. Negative for fever.  HENT: Negative for congestion.   Eyes: Negative for blurred vision.  Respiratory: Negative for cough.   Cardiovascular: Negative for chest pain and palpitations.  Gastrointestinal: Negative for vomiting.  Musculoskeletal: Positive for back pain and joint pain.  Skin: Negative for rash.  Neurological: Negative for loss of consciousness and headaches.       Objective:    Physical Exam  Constitutional: She is oriented to person, place, and time. She appears well-developed and well-nourished. No distress.  HENT:  Head: Normocephalic and atraumatic.  Eyes: Conjunctivae are normal.  Neck: Normal range of motion. No thyromegaly present.  Cardiovascular: Normal rate and regular rhythm.   Pulmonary/Chest: Effort normal and breath sounds normal. She has no wheezes.  Abdominal: Soft. Bowel sounds are normal. She exhibits no mass. There is no tenderness.  Musculoskeletal: She exhibits no edema or deformity.  Neurological: She is alert and oriented to person, place, and time.  Skin: Skin is warm and dry. She is not diaphoretic.  Psychiatric: She has a normal mood and affect.    BP 116/80 (BP Location: Right Wrist, Patient Position: Sitting, Cuff Size: Normal)   Pulse 73   Temp 97.5 F (36.4 C) (Oral)   Ht 5\' 2"  (1.575 m)   Wt 195 lb (88.5 kg)   SpO2 96% Comment: RA  BMI 35.67 kg/m  Wt Readings from Last 3 Encounters:  07/10/16 195 lb (88.5 kg)  03/06/16 193 lb 3.2 oz (87.6 kg)  01/21/16 194 lb 2 oz (88.1 kg)     Lab Results  Component Value Date   WBC 6.8 07/10/2016   HGB 14.2 07/10/2016   HCT 42.2 07/10/2016   PLT 282.0 07/10/2016   GLUCOSE 93 07/10/2016   CHOL 145 02/16/2013   TRIG 117 02/16/2013   HDL 46 02/16/2013   LDLCALC 76 02/16/2013   ALT 13 07/10/2016   AST 14 07/10/2016   NA 141 07/10/2016   K 4.3 07/10/2016   CL 107 07/10/2016   CREATININE 0.74 07/10/2016   BUN 16 07/10/2016    CO2 29 07/10/2016   TSH 0.41 07/10/2016    Lab Results  Component Value Date   TSH 0.41 07/10/2016   Lab Results  Component Value Date   WBC 6.8 07/10/2016   HGB 14.2 07/10/2016   HCT 42.2 07/10/2016   MCV 88.0 07/10/2016   PLT 282.0 07/10/2016   Lab Results  Component Value Date   NA 141 07/10/2016   K 4.3 07/10/2016   CO2 29 07/10/2016   GLUCOSE 93 07/10/2016   BUN 16 07/10/2016   CREATININE 0.74 07/10/2016   BILITOT 0.3 07/10/2016   ALKPHOS 58 07/10/2016   AST 14 07/10/2016   ALT 13 07/10/2016   PROT 7.1 07/10/2016   ALBUMIN 4.1 07/10/2016   CALCIUM 9.7 07/10/2016   GFR 81.23 07/10/2016   Lab Results  Component Value Date   CHOL 145 02/16/2013   Lab Results  Component Value Date   HDL 46 02/16/2013   Lab Results  Component Value Date   LDLCALC 76 02/16/2013   Lab Results  Component Value Date   TRIG 117 02/16/2013   Lab Results  Component Value Date   CHOLHDL 3.2 02/16/2013   No results found for: HGBA1C    I acted as a Education administrator for Dr. Charlett Blake. Raiford Noble, RMA  Assessment & Plan:   Problem List Items Addressed This Visit    Thyroid disease    On Levothyroxine, continue to monitor      Relevant Medications   SYNTHROID 150 MCG tablet   Hypertension    Well controlled, no changes to meds. Encouraged heart healthy diet such as the DASH diet and exercise as tolerated.       Relevant Orders   CBC (Completed)   Comprehensive metabolic panel (Completed)   TSH (Completed)   Comprehensive metabolic panel   Vitamin D deficiency    Check level today, maintain daily supplement      Relevant Orders   VITAMIN D 25 Hydroxy (Vit-D Deficiency, Fractures) (Completed)   Obesity    Encouraged DASH diet, decrease po intake and increase exercise as tolerated. Needs 7-8 hours of sleep nightly. Avoid trans fats, eat small, frequent meals every 4-5 hours with lean proteins, complex carbs and healthy fats. Minimize simple carbs      Intertrigo    Good  response to Ketoconazole in past, is given a refill today       Other Visit Diagnoses    Tinea       Relevant Medications   ketoconazole (NIZORAL) 2 % cream      I have discontinued Ms. Wakeley's ciprofloxacin. I have also changed her SYNTHROID. Additionally, I am having her maintain her vitamin C, Vitamin D, Co Q 10, Fish Oil, aspirin, Probiotic Product (PROBIOTIC DAILY PO), nystatin, albuterol, triamcinolone cream, furosemide, lisinopril-hydrochlorothiazide, and ketoconazole.  Meds ordered this encounter  Medications  . ketoconazole (NIZORAL) 2 % cream    Sig: Apply 1 application topically daily.    Dispense:  30 g    Refill:  0  . SYNTHROID 150 MCG tablet    Sig: 1 tab po daily x 6 days a week    Dispense:  30 tablet    Refill:  4    CMA served as scribe during this visit. History, Physical and Plan performed by medical provider. Documentation and orders reviewed and attested to.  Penni Homans, MD

## 2016-07-10 NOTE — Assessment & Plan Note (Signed)
Well controlled, no changes to meds. Encouraged heart healthy diet such as the DASH diet and exercise as tolerated.  °

## 2016-07-12 NOTE — Assessment & Plan Note (Signed)
Encouraged DASH diet, decrease po intake and increase exercise as tolerated. Needs 7-8 hours of sleep nightly. Avoid trans fats, eat small, frequent meals every 4-5 hours with lean proteins, complex carbs and healthy fats. Minimize simple carbs 

## 2016-07-12 NOTE — Assessment & Plan Note (Signed)
Good response to Ketoconazole in past, is given a refill today

## 2016-08-18 ENCOUNTER — Telehealth: Payer: Self-pay | Admitting: Family Medicine

## 2016-08-18 ENCOUNTER — Other Ambulatory Visit: Payer: Self-pay | Admitting: Family Medicine

## 2016-08-18 ENCOUNTER — Other Ambulatory Visit (INDEPENDENT_AMBULATORY_CARE_PROVIDER_SITE_OTHER): Payer: Medicare HMO

## 2016-08-18 DIAGNOSIS — R350 Frequency of micturition: Secondary | ICD-10-CM

## 2016-08-18 NOTE — Telephone Encounter (Signed)
Caller name: Relationship to patient: Self Can be reached: [phone]  Pharmacy:  [id number] - [place], [place] [phone] (Phone) [phone] (Fax)     Reason for call: Patient states she is now having symptoms of a UTI (frequent urination and pressure) and would like to have medication called in

## 2016-08-18 NOTE — Telephone Encounter (Signed)
Called the patient to get clarification on symptoms--no answer/no voice mail Advise please

## 2016-08-18 NOTE — Telephone Encounter (Signed)
Patient is having frequent urination and pressure.  No chills, no fever---these symptoms have been going on the past 24 hours.  But frequency worsening. Spoke to PCP who did approve ua and urine culture. Patient informed/schedule lab appt./put order in computer.

## 2016-08-19 ENCOUNTER — Encounter: Payer: Self-pay | Admitting: Family Medicine

## 2016-08-19 ENCOUNTER — Other Ambulatory Visit: Payer: Self-pay | Admitting: Family Medicine

## 2016-08-19 LAB — URINALYSIS, ROUTINE W REFLEX MICROSCOPIC
Bilirubin Urine: NEGATIVE
Nitrite: NEGATIVE
SPECIFIC GRAVITY, URINE: 1.01 (ref 1.000–1.030)
Total Protein, Urine: NEGATIVE
URINE GLUCOSE: NEGATIVE
UROBILINOGEN UA: 0.2 (ref 0.0–1.0)
pH: 6 (ref 5.0–8.0)

## 2016-08-19 MED ORDER — CIPROFLOXACIN HCL 250 MG PO TABS: 250.0000 mg | ORAL_TABLET | Freq: Two times a day (BID) | ORAL | 0 refills | Status: DC

## 2016-08-20 LAB — URINE CULTURE

## 2016-09-01 ENCOUNTER — Other Ambulatory Visit: Payer: Self-pay | Admitting: Family Medicine

## 2016-10-16 NOTE — Progress Notes (Signed)
Subjective:   Felicia Lowe is a 76 y.o. female who presents for Medicare Annual (Subsequent) preventive examination.  She found out in the past that her home water supply had high levels of MTBE-a gasoline additive. 1 year ago she added a charcoal filtration system to resolve the problem. The state and county have evaluated the water supply and found it to be safe. Since, she has had skin problems-itching, bumps on arms and breasts. She does not know if these things are related, but she would like this information to be in her chart.   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 She is also active in concert choir, but is limited due to her inability to stand for long periods of time due to hip pain.  Review of Systems:  No ROS.  Medicare Wellness Visit.  Cardiac Risk Factors include: advanced age (>61men, >41 women);dyslipidemia;hypertension;obesity (BMI >30kg/m2)  Sleep patterns: no sleep issues. Sleeps 5-6 hours nightly. Home Safety/Smoke Alarms: Feels safe in home. Smoke alarms in place. Security system in place. Living environment; residence and Firearm Safety: Lives w/ husband. 1-story house/ trailer, number of inside stairs: 1, tub-shower, firearms stored safely.  Seat Belt Safety/Bike Helmet: Wears seat belt.   Counseling:   Eye Exam- Follows w/ Dr. Sharren Bridge  Dental- Dr. Alinda Money twice yearly.  Female:   Pap- Aged out.     Mammo- Aged out.        Dexa scan- Not on file.       CCS- last 07/06/04. No report on file. She reports no polyps and no FH colon CA. She is interested in Cologuard.     Objective:     Vitals: BP 138/66   Pulse 83   Temp 98.4 F (36.9 C) (Oral)   Ht 5\' 2"  (1.575 m)   Wt 193 lb (87.5 kg)   SpO2 97%   BMI 35.30 kg/m   Body mass index is 35.3 kg/m.   Tobacco History  Smoking Status  . Never Smoker  Smokeless Tobacco  . Never Used     Counseling given: Not Answered   Past Medical History:  Diagnosis Date  . Allergy   .  Arthritis    osteoarthritis, severe, b/l knees &b/l hips  replaced  . BPV (benign positional vertigo) 02/19/2013   mild  . Chicken pox as a child  . GERD (gastroesophageal reflux disease) 05/27/2014  . H/O breast biopsy    benign, on left  . Hypertension   . Kidney stone 2000   nephritis as a child  . Measles as a child  . Mumps as a child  . Obesity   . Osteoporosis   . Preventative health care 11/25/2014  . Skin lesion of face 05/18/2012  . Thyroid disease   . Unsteady gait 11/25/2014  . UTI (urinary tract infection) 12/11/2012  . Viral respiratory illness 05/21/2013  . Vitamin D deficiency    Past Surgical History:  Procedure Laterality Date  . CATARACT EXTRACTION Left 2017  . HIP SURGERY     both hips  . KNEE SURGERY     both knees replaced   Family History  Problem Relation Age of Onset  . Hyperlipidemia Mother   . Hypertension Mother   . Heart attack Mother   . Cancer Father     lung- smoker  . Heart attack Father     X 2  . Thyroid disease Brother   . Hypertension Son   . Cancer Paternal Grandmother  History  Sexual Activity  . Sexual activity: No    Outpatient Encounter Prescriptions as of 10/19/2016  Medication Sig  . Ascorbic Acid (VITAMIN C) 1000 MG tablet Take 1,000 mg by mouth daily.  Marland Kitchen aspirin 81 MG tablet Take 81 mg by mouth daily.  . Cholecalciferol (VITAMIN D) 2000 UNITS tablet Take 2,000 Units by mouth daily.  . Coenzyme Q10 (CO Q 10) 100 MG CAPS Take 400 mg by mouth daily.  . furosemide (LASIX) 20 MG tablet Take 1 tablet (20 mg total) by mouth daily as needed.  Marland Kitchen lisinopril-hydrochlorothiazide (PRINZIDE,ZESTORETIC) 10-12.5 MG tablet Take 1 tablet by mouth daily.  . Omega-3 Fatty Acids (FISH OIL) 1000 MG CPDR Take 1 capsule by mouth daily.  . Probiotic Product (PROBIOTIC DAILY PO) Take by mouth.  . SYNTHROID 150 MCG tablet 1 tab po daily x 6 days a week  . albuterol (PROAIR HFA) 108 (90 Base) MCG/ACT inhaler Inhale 2 puffs into the lungs every  4 (four) hours as needed for wheezing or shortness of breath. (Patient not taking: Reported on 10/19/2016)  . ketoconazole (NIZORAL) 2 % cream Apply 1 application topically daily. (Patient not taking: Reported on 10/19/2016)  . SYNTHROID 150 MCG tablet TAKE 1 TABLET BY MOUTH EVERY DAY BEFORE BREAKFAST. (Patient not taking: Reported on 10/19/2016)  . triamcinolone cream (KENALOG) 0.1 % Apply 1 application topically 2 (two) times daily. (Patient not taking: Reported on 10/19/2016)  . [DISCONTINUED] ciprofloxacin (CIPRO) 250 MG tablet Take 1 tablet (250 mg total) by mouth 2 (two) times daily. (Patient not taking: Reported on 10/19/2016)  . [DISCONTINUED] nystatin (MYCOSTATIN/NYSTOP) 100000 UNIT/GM POWD 1 tsp. Po tid X 7 days (Patient not taking: Reported on 07/10/2016)   No facility-administered encounter medications on file as of 10/19/2016.     Activities of Daily Living In your present state of health, do you have any difficulty performing the following activities: 10/19/2016  Hearing? N  Vision? N  Difficulty concentrating or making decisions? N  Walking or climbing stairs? N  Dressing or bathing? N  Doing errands, shopping? N  Preparing Food and eating ? N  Using the Toilet? N  In the past six months, have you accidently leaked urine? N  Do you have problems with loss of bowel control? N  Managing your Medications? N  Managing your Finances? N  Housekeeping or managing your Housekeeping? N  Some recent data might be hidden    Patient Care Team: Mosie Lukes, MD as PCP - General (Family Medicine) Stephannie Li, OD as Consulting Physician (Ophthalmology) Valinda Party, MD as Consulting Physician (Rheumatology)    Assessment:    Physical assessment deferred to PCP.  Exercise Activities and Dietary recommendations Current Exercise Habits: Structured exercise class, Type of exercise: yoga, Time (Minutes): 45, Frequency (Times/Week): 3, Weekly Exercise (Minutes/Week): 135, Intensity:  Mild  Diet (meal preparation, eat out, water intake, caffeinated beverages, dairy products, fruits and vegetables): in general, a "healthy" diet  , well balanced, on average, 3 meals per day. She would like to lose some weight and is trying to drink more water. She tries not to snack during the day and avoid late night snacking does not eat "cruddy stuff." She likes fruits and veggies and eats salads regularly. Tries to be conscientious of serving sizes of meat.     Goals    . Increase physical activity    . Weight (lb) < 190 lb (86.2 kg)      Fall Risk Fall Risk  10/19/2016 03/06/2016 11/15/2014 11/10/2013  Falls in the past year? No No No No   Depression Screen PHQ 2/9 Scores 10/19/2016 03/06/2016 11/15/2014 11/10/2013  PHQ - 2 Score 0 0 0 0     Cognitive Function MMSE - Mini Mental State Exam 10/19/2016  Orientation to time 5  Orientation to Place 5  Registration 3  Attention/ Calculation 5  Recall 3  Language- name 2 objects 2  Language- repeat 1  Language- follow 3 step command 3  Language- read & follow direction 1  Write a sentence 1  Copy design 0  Total score 29        Immunization History  Administered Date(s) Administered  . Influenza, Quadrivalent, Recombinant, Inj, Pf 05/15/2016  . Influenza,trivalent, recombinat, inj, PF 05/17/2014, 05/07/2015  . Pneumococcal Conjugate-13 03/06/2016  . Pneumococcal Polysaccharide-23 07/06/2010  . Td 03/06/2016  . Tdap 07/06/2001  . Zoster 04/06/2011   Screening Tests Health Maintenance  Topic Date Due  . DEXA SCAN  02/13/2006  . COLONOSCOPY  07/06/2014  . INFLUENZA VACCINE  02/03/2017  . TETANUS/TDAP  03/06/2026  . PNA vac Low Risk Adult  Completed      Plan:    Follow-up w/ PCP as scheduled.  Bring a copy of your advance directives to your next office visit.  Cologuard will be ordered via portal.  During the course of the visit the patient was educated and counseled about the following appropriate screening and  preventive services:   Vaccines to include Pneumococcal, Influenza, Td, HCV  Cardiovascular Disease  Colorectal cancer screening  Bone density screening  Diabetes screening  Glaucoma screening  Nutrition counseling   Patient Instructions (the written plan) was given to the patient.   Dorrene German, RN  10/19/2016

## 2016-10-16 NOTE — Progress Notes (Signed)
Pre visit review using our clinic review tool, if applicable. No additional management support is needed unless otherwise documented below in the visit note. 

## 2016-10-19 ENCOUNTER — Ambulatory Visit: Payer: Medicare HMO | Admitting: Family Medicine

## 2016-10-19 ENCOUNTER — Ambulatory Visit (INDEPENDENT_AMBULATORY_CARE_PROVIDER_SITE_OTHER): Payer: Medicare HMO | Admitting: Family Medicine

## 2016-10-19 ENCOUNTER — Encounter: Payer: Self-pay | Admitting: Family Medicine

## 2016-10-19 VITALS — BP 138/66 | HR 83 | Temp 98.4°F | Ht 62.0 in | Wt 193.0 lb

## 2016-10-19 DIAGNOSIS — L309 Dermatitis, unspecified: Secondary | ICD-10-CM | POA: Diagnosis not present

## 2016-10-19 DIAGNOSIS — E6609 Other obesity due to excess calories: Secondary | ICD-10-CM

## 2016-10-19 DIAGNOSIS — M1990 Unspecified osteoarthritis, unspecified site: Secondary | ICD-10-CM

## 2016-10-19 DIAGNOSIS — E559 Vitamin D deficiency, unspecified: Secondary | ICD-10-CM | POA: Diagnosis not present

## 2016-10-19 DIAGNOSIS — Z0000 Encounter for general adult medical examination without abnormal findings: Secondary | ICD-10-CM | POA: Diagnosis not present

## 2016-10-19 DIAGNOSIS — K219 Gastro-esophageal reflux disease without esophagitis: Secondary | ICD-10-CM

## 2016-10-19 MED ORDER — PERMETHRIN 5 % EX CREA: 1.0000 "application " | TOPICAL_CREAM | Freq: Once | CUTANEOUS | 1 refills | Status: AC

## 2016-10-19 NOTE — Assessment & Plan Note (Signed)
Continue to take daily supplements.

## 2016-10-19 NOTE — Assessment & Plan Note (Signed)
Avoid offending foods take probiotics. Do not eat large meals in late evening and consider raising head of bed.

## 2016-10-19 NOTE — Assessment & Plan Note (Signed)
Has been following with chiropractor in [place], Dr Deatra Robinson and her hips, low back and neck all improving.

## 2016-10-19 NOTE — Assessment & Plan Note (Signed)
Has been having recurrent pruritic papular erythematous rash on arms and trunk and legs. She responds to Zyrtec and anti itch lotion for several hours but then itching returns. She notes it is worse at night. May correlate with new mattress. Will have her to increase Zyrtec to bid and add Zantac 150 mg po bid. If no response then try a course of Permethrin. If no response then refer to dermatology

## 2016-10-19 NOTE — Assessment & Plan Note (Signed)
Encouraged DASH diet, decrease po intake and increase exercise as tolerated. Needs 7-8 hours of sleep nightly. Avoid trans fats, eat small, frequent meals every 4-5 hours with lean proteins, complex carbs and healthy fats. Minimize simple carbs, referred to bariatric program 

## 2016-10-19 NOTE — Patient Instructions (Addendum)
Call [phone] to schedule your bone density. Bring a copy of your advance directives to your next office visit.  Zyrtec 10 mg twice daily x 30 days then as needed Zantac/Ranitidine 150 mg twice daily x 30 days then as needed  If no response then try the permetrin Carbohydrate Counting for Diabetes Mellitus, Adult Carbohydrate counting is a method for keeping track of how many carbohydrates you eat. Eating carbohydrates naturally increases the amount of sugar (glucose) in the blood. Counting how many carbohydrates you eat helps keep your blood glucose within normal limits, which helps you manage your diabetes (diabetes mellitus). It is important to know how many carbohydrates you can safely have in each meal. This is different for every person. A diet and nutrition specialist (registered dietitian) can help you make a meal plan and calculate how many carbohydrates you should have at each meal and snack. Carbohydrates are found in the following foods:  Grains, such as breads and cereals.  Dried beans and soy products.  Starchy vegetables, such as potatoes, peas, and corn.  Fruit and fruit juices.  Milk and yogurt.  Sweets and snack foods, such as cake, cookies, candy, chips, and soft drinks. How do I count carbohydrates? There are two ways to count carbohydrates in food. You can use either of the methods or a combination of both. Reading "Nutrition Facts" on packaged food  The "Nutrition Facts" list is included on the labels of almost all packaged foods and beverages in the [place] It includes:  The serving size.  Information about nutrients in each serving, including the grams (g) of carbohydrate per serving. To use the "Nutrition Facts":  Decide how many servings you will have.  Multiply the number of servings by the number of carbohydrates per serving.  The resulting number is the total amount of carbohydrates that you will be having. Learning standard serving sizes of other foods   When you eat foods containing carbohydrates that are not packaged or do not include "Nutrition Facts" on the label, you need to measure the servings in order to count the amount of carbohydrates:  Measure the foods that you will eat with a food scale or measuring cup, if needed.  Decide how many standard-size servings you will eat.  Multiply the number of servings by 15. Most carbohydrate-rich foods have about 15 g of carbohydrates per serving.  For example, if you eat 8 oz (170 g) of strawberries, you will have eaten 2 servings and 30 g of carbohydrates (2 servings x 15 g = 30 g).  For foods that have more than one food mixed, such as soups and casseroles, you must count the carbohydrates in each food that is included. The following list contains standard serving sizes of common carbohydrate-rich foods. Each of these servings has about 15 g of carbohydrates:   hamburger bun or  English muffin.   oz (15 mL) syrup.   oz (14 g) jelly.  1 slice of bread.  1 six-inch tortilla.  3 oz (85 g) cooked rice or pasta.  4 oz (113 g) cooked dried beans.  4 oz (113 g) starchy vegetable, such as peas, corn, or potatoes.  4 oz (113 g) hot cereal.  4 oz (113 g) mashed potatoes or  of a large baked potato.  4 oz (113 g) canned or frozen fruit.  4 oz (120 mL) fruit juice.  4-6 crackers.  6 chicken nuggets.  6 oz (170 g) unsweetened dry cereal.  6 oz (170 g) plain  fat-free yogurt or yogurt sweetened with artificial sweeteners.  8 oz (240 mL) milk.  8 oz (170 g) fresh fruit or one small piece of fruit.  24 oz (680 g) popped popcorn. Example of carbohydrate counting Sample meal   3 oz (85 g) chicken breast.  6 oz (170 g) brown rice.  4 oz (113 g) corn.  8 oz (240 mL) milk.  8 oz (170 g) strawberries with sugar-free whipped topping. Carbohydrate calculation  1. Identify the foods that contain carbohydrates:  Rice.  Corn.  Milk.  Strawberries. 2. Calculate how  many servings you have of each food:  2 servings rice.  1 serving corn.  1 serving milk.  1 serving strawberries. 3. Multiply each number of servings by 15 g:  2 servings rice x 15 g = 30 g.  1 serving corn x 15 g = 15 g.  1 serving milk x 15 g = 15 g.  1 serving strawberries x 15 g = 15 g. 4. Add together all of the amounts to find the total grams of carbohydrates eaten:  30 g + 15 g + 15 g + 15 g = 75 g of carbohydrates total. This information is not intended to replace advice given to you by your health care provider. Make sure you discuss any questions you have with your health care provider. Document Released: 06/22/2005 Document Revised: 01/10/2016 Document Reviewed: 12/04/2015 Elsevier Interactive Patient Education  2017 [employer].

## 2016-10-19 NOTE — Progress Notes (Signed)
Subjective:  I acted as a Education administrator for Dr. Charlett Blake. [name], [place]   Patient ID: Felicia Lowe, female    DOB: 1940/11/07, 76 y.o.   MRN: [id number]  Chief Complaint  Patient presents with  . Medicare Wellness    w/ RN  . Hypertension    Pt here for follow-up  . Hypothyroidism    Pt here for follow-up  . Tinea    Itchy bumps on arms on breasts since December. Uses camphor + menthol lotion at home.     HPI  Patient is in today for follow up on obesity, reflux, arthritis and dermatitis. She is struggling with worsening rash over last month. She is noting a roughly one month history of worsening pruritic rash. Notes it is worse when she lies down at Night. She notes that taking a Zyrtec and applying anti-itch lotion gets her couple hours of relief but then symptoms recur. The rash does potentially correlate with the getting up and new mattress. Her husband does not sleep in the same bed so he does not have similar symptoms. Otherwise she feels well. No recent febrile illness or hospitalization. She began seeing a chiropractor for chronic neck back and hip pain. She is finding it helpful so far. 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 Denies CP/palp/SOB/HA/congestion/fevers/GI or GU c/o. Taking meds as prescribed  Patient Care Team: Mosie Lukes, MD as PCP - General (Family Medicine) Stephannie Li, OD as Consulting Physician (Ophthalmology) Valinda Party, MD as Consulting Physician (Rheumatology)   Past Medical History:  Diagnosis Date  . Allergy   . Arthritis    osteoarthritis, severe, b/l knees &b/l hips  replaced  . BPV (benign positional vertigo) 02/19/2013   mild  . Chicken pox as a child  . GERD (gastroesophageal reflux disease) 05/27/2014  . H/O breast biopsy    benign, on left  . Hypertension   . Kidney stone 2000   nephritis as a  child  . Measles as a child  . Mumps as a child  . Obesity   . Osteoporosis   . Preventative health care 11/25/2014  . Skin lesion of face 05/18/2012  . Thyroid disease   . Unsteady gait 11/25/2014  . UTI (urinary tract infection) 12/11/2012  . Viral respiratory illness 05/21/2013  . Vitamin D deficiency     Past Surgical History:  Procedure Laterality Date  . CATARACT EXTRACTION Left 2017  . HIP SURGERY     both hips  . KNEE SURGERY     both knees replaced    Family History  Problem Relation Age of Onset  . Hyperlipidemia Mother   . Hypertension Mother   . Heart attack Mother   . Cancer Father     lung- smoker  . Heart attack Father     X 2  . Thyroid disease Brother   . Hypertension Son   . Cancer Paternal Grandmother     Social History   Social History  . Marital status: Unknown    Spouse name: N/A  . Number of children: N/A  . Years of education: N/A   Occupational History  . Not on file.   Social History Main Topics  . Smoking status: Never Smoker  . Smokeless tobacco: Never Used  . Alcohol use Yes     Comment: Occasional glass of wine  .  Drug use: No  . Sexual activity: No   Other Topics Concern  . Not on file   Social History Narrative  . No narrative on file    Outpatient Medications Prior to Visit  Medication Sig Dispense Refill  . Ascorbic Acid (VITAMIN C) 1000 MG tablet Take 1,000 mg by mouth daily.    Marland Kitchen aspirin 81 MG tablet Take 81 mg by mouth daily.    . Cholecalciferol (VITAMIN D) 2000 UNITS tablet Take 2,000 Units by mouth daily.    . Coenzyme Q10 (CO Q 10) 100 MG CAPS Take 400 mg by mouth daily.    . furosemide (LASIX) 20 MG tablet Take 1 tablet (20 mg total) by mouth daily as needed. 30 tablet 4  . lisinopril-hydrochlorothiazide (PRINZIDE,ZESTORETIC) 10-12.5 MG tablet Take 1 tablet by mouth daily. 90 tablet 1  . Omega-3 Fatty Acids (FISH OIL) 1000 MG CPDR Take 1 capsule by mouth daily.    . Probiotic Product (PROBIOTIC DAILY PO)  Take by mouth.    . SYNTHROID 150 MCG tablet 1 tab po daily x 6 days a week 30 tablet 4  . albuterol (PROAIR HFA) 108 (90 Base) MCG/ACT inhaler Inhale 2 puffs into the lungs every 4 (four) hours as needed for wheezing or shortness of breath. (Patient not taking: Reported on 10/19/2016) 1 Inhaler 0  . ketoconazole (NIZORAL) 2 % cream Apply 1 application topically daily. (Patient not taking: Reported on 10/19/2016) 30 g 0  . SYNTHROID 150 MCG tablet TAKE 1 TABLET BY MOUTH EVERY DAY BEFORE BREAKFAST. (Patient not taking: Reported on 10/19/2016) 30 tablet 4  . triamcinolone cream (KENALOG) 0.1 % Apply 1 application topically 2 (two) times daily. (Patient not taking: Reported on 10/19/2016) 85.2 g 3  . ciprofloxacin (CIPRO) 250 MG tablet Take 1 tablet (250 mg total) by mouth 2 (two) times daily. (Patient not taking: Reported on 10/19/2016) 10 tablet 0  . nystatin (MYCOSTATIN/NYSTOP) 100000 UNIT/GM POWD 1 tsp. Po tid X 7 days (Patient not taking: Reported on 07/10/2016) 60 g 3   No facility-administered medications prior to visit.     Allergies  Allergen Reactions  . Eggs Or Egg-Derived Products Shortness Of Breath and Rash  . Sulfa Antibiotics Nausea And Vomiting  . Keflex [Cephalexin] Rash    Rash   . Penicillins Rash  . Poison Ivy Extract [Poison Ivy Extract] Rash    Review of Systems  Constitutional: Negative for fever and malaise/fatigue.  HENT: Negative for congestion.   Eyes: Negative for blurred vision.  Respiratory: Negative for shortness of breath.   Cardiovascular: Negative for chest pain, palpitations and leg swelling.  Gastrointestinal: Negative for abdominal pain, blood in stool and nausea.  Genitourinary: Negative for dysuria and frequency.  Musculoskeletal: Positive for back pain, joint pain and neck pain. Negative for falls.  Skin: Positive for itching and rash.  Neurological: Negative for dizziness, loss of consciousness and headaches.  Endo/Heme/Allergies: Negative for  environmental allergies.  Psychiatric/Behavioral: Negative for depression. The patient is not nervous/anxious.        Objective:    Physical Exam  Constitutional: She is oriented to person, place, and time. She appears well-developed and well-nourished. No distress.  HENT:  Head: Normocephalic and atraumatic.  Nose: Nose normal.  Eyes: Right eye exhibits no discharge. Left eye exhibits no discharge.  Neck: Normal range of motion. Neck supple.  Cardiovascular: Normal rate and regular rhythm.   No murmur heard. Pulmonary/Chest: Effort normal and breath sounds normal.  Abdominal: Soft. Bowel  sounds are normal. There is no tenderness.  Musculoskeletal: She exhibits no edema.  Neurological: She is alert and oriented to person, place, and time.  Skin: Skin is warm and dry.  Psychiatric: She has a normal mood and affect.  Nursing note and vitals reviewed.   BP 138/66   Pulse 83   Temp 98.4 F (36.9 C) (Oral)   Ht 5\' 2"  (1.575 m)   Wt 193 lb (87.5 kg)   SpO2 97%   BMI 35.30 kg/m  Wt Readings from Last 3 Encounters:  10/19/16 193 lb (87.5 kg)  07/10/16 195 lb (88.5 kg)  03/06/16 193 lb 3.2 oz (87.6 kg)   BP Readings from Last 3 Encounters:  10/19/16 138/66  07/10/16 116/80  03/06/16 (!) 116/58     Immunization History  Administered Date(s) Administered  . Influenza, Quadrivalent, Recombinant, Inj, Pf 05/15/2016  . Influenza,trivalent, recombinat, inj, PF 05/17/2014, 05/07/2015  . Pneumococcal Conjugate-13 03/06/2016  . Pneumococcal Polysaccharide-23 07/06/2010  . Td 03/06/2016  . Tdap 07/06/2001  . Zoster 04/06/2011    Health Maintenance  Topic Date Due  . DEXA SCAN  02/13/2006  . COLONOSCOPY  07/06/2014  . INFLUENZA VACCINE  02/03/2017  . TETANUS/TDAP  03/06/2026  . PNA vac Low Risk Adult  Completed    Lab Results  Component Value Date   WBC 6.8 07/10/2016   HGB 14.2 07/10/2016   HCT 42.2 07/10/2016   PLT 282.0 07/10/2016   GLUCOSE 93 07/10/2016   CHOL  145 02/16/2013   TRIG 117 02/16/2013   HDL 46 02/16/2013   LDLCALC 76 02/16/2013   ALT 13 07/10/2016   AST 14 07/10/2016   NA 141 07/10/2016   K 4.3 07/10/2016   CL 107 07/10/2016   CREATININE 0.74 07/10/2016   BUN 16 07/10/2016   CO2 29 07/10/2016   TSH 0.41 07/10/2016    Lab Results  Component Value Date   TSH 0.41 07/10/2016   Lab Results  Component Value Date   WBC 6.8 07/10/2016   HGB 14.2 07/10/2016   HCT 42.2 07/10/2016   MCV 88.0 07/10/2016   PLT 282.0 07/10/2016   Lab Results  Component Value Date   NA 141 07/10/2016   K 4.3 07/10/2016   CO2 29 07/10/2016   GLUCOSE 93 07/10/2016   BUN 16 07/10/2016   CREATININE 0.74 07/10/2016   BILITOT 0.3 07/10/2016   ALKPHOS 58 07/10/2016   AST 14 07/10/2016   ALT 13 07/10/2016   PROT 7.1 07/10/2016   ALBUMIN 4.1 07/10/2016   CALCIUM 9.7 07/10/2016   GFR 81.23 07/10/2016   Lab Results  Component Value Date   CHOL 145 02/16/2013   Lab Results  Component Value Date   HDL 46 02/16/2013   Lab Results  Component Value Date   LDLCALC 76 02/16/2013   Lab Results  Component Value Date   TRIG 117 02/16/2013   Lab Results  Component Value Date   CHOLHDL 3.2 02/16/2013   No results found for: HGBA1C       Assessment & Plan:   Problem List Items Addressed This Visit    Vitamin D deficiency    Continue to take daily supplements.       Obesity    Encouraged DASH diet, decrease po intake and increase exercise as tolerated. Needs 7-8 hours of sleep nightly. Avoid trans fats, eat small, frequent meals every 4-5 hours with lean proteins, complex carbs and healthy fats. Minimize simple carbs, referred to bariatric program  Arthritis    Has been following with chiropractor in [place], Dr Deatra Robinson and her hips, low back and neck all improving.       Dermatitis    Has been having recurrent pruritic papular erythematous rash on arms and trunk and legs. She responds to Zyrtec and anti itch lotion for several  hours but then itching returns. She notes it is worse at night. May correlate with new mattress. Will have her to increase Zyrtec to bid and add Zantac 150 mg po bid. If no response then try a course of Permethrin. If no response then refer to dermatology      GERD (gastroesophageal reflux disease)    Avoid offending foods take probiotics. Do not eat large meals in late evening and consider raising head of bed.        Other Visit Diagnoses    Encounter for Medicare annual wellness exam    -  Primary      I have discontinued Ms. Demos's nystatin and ciprofloxacin. I am also having her maintain her vitamin C, Vitamin D, Co Q 10, Fish Oil, aspirin, Probiotic Product (PROBIOTIC DAILY PO), albuterol, triamcinolone cream, furosemide, lisinopril-hydrochlorothiazide, ketoconazole, SYNTHROID, and SYNTHROID.  No orders of the defined types were placed in this encounter.   CMA served as Education administrator during this visit. History, Physical and Plan performed by medical provider. Documentation and orders reviewed and attested to.  Penni Homans, MD

## 2016-10-30 DIAGNOSIS — Z1211 Encounter for screening for malignant neoplasm of colon: Secondary | ICD-10-CM | POA: Diagnosis not present

## 2016-10-30 LAB — COLOGUARD: Cologuard: NEGATIVE

## 2016-11-02 ENCOUNTER — Encounter: Payer: Self-pay | Admitting: Family Medicine

## 2016-11-02 ENCOUNTER — Other Ambulatory Visit: Payer: Self-pay | Admitting: Family Medicine

## 2016-11-02 DIAGNOSIS — L309 Dermatitis, unspecified: Secondary | ICD-10-CM

## 2016-11-05 ENCOUNTER — Encounter: Payer: Self-pay | Admitting: Family Medicine

## 2016-11-23 DIAGNOSIS — L249 Irritant contact dermatitis, unspecified cause: Secondary | ICD-10-CM | POA: Diagnosis not present

## 2016-12-01 ENCOUNTER — Other Ambulatory Visit: Payer: Self-pay | Admitting: Family Medicine

## 2016-12-01 DIAGNOSIS — R21 Rash and other nonspecific skin eruption: Secondary | ICD-10-CM | POA: Diagnosis not present

## 2016-12-01 DIAGNOSIS — M179 Osteoarthritis of knee, unspecified: Secondary | ICD-10-CM | POA: Diagnosis not present

## 2016-12-01 DIAGNOSIS — M169 Osteoarthritis of hip, unspecified: Secondary | ICD-10-CM | POA: Diagnosis not present

## 2016-12-01 DIAGNOSIS — Z8739 Personal history of other diseases of the musculoskeletal system and connective tissue: Secondary | ICD-10-CM | POA: Diagnosis not present

## 2016-12-14 DIAGNOSIS — D485 Neoplasm of uncertain behavior of skin: Secondary | ICD-10-CM | POA: Diagnosis not present

## 2016-12-14 DIAGNOSIS — L509 Urticaria, unspecified: Secondary | ICD-10-CM | POA: Diagnosis not present

## 2016-12-14 DIAGNOSIS — L508 Other urticaria: Secondary | ICD-10-CM | POA: Diagnosis not present

## 2016-12-31 ENCOUNTER — Encounter: Payer: Self-pay | Admitting: Family Medicine

## 2017-01-01 ENCOUNTER — Encounter: Payer: Self-pay | Admitting: Family Medicine

## 2017-01-08 DIAGNOSIS — J309 Allergic rhinitis, unspecified: Secondary | ICD-10-CM | POA: Diagnosis not present

## 2017-01-08 DIAGNOSIS — R21 Rash and other nonspecific skin eruption: Secondary | ICD-10-CM | POA: Diagnosis not present

## 2017-01-08 DIAGNOSIS — L501 Idiopathic urticaria: Secondary | ICD-10-CM | POA: Diagnosis not present

## 2017-01-08 DIAGNOSIS — H1045 Other chronic allergic conjunctivitis: Secondary | ICD-10-CM | POA: Diagnosis not present

## 2017-01-18 ENCOUNTER — Ambulatory Visit: Payer: Medicare HMO | Admitting: Family Medicine

## 2017-01-21 ENCOUNTER — Encounter: Payer: Self-pay | Admitting: Family Medicine

## 2017-01-21 ENCOUNTER — Ambulatory Visit (INDEPENDENT_AMBULATORY_CARE_PROVIDER_SITE_OTHER): Payer: Medicare HMO | Admitting: Family Medicine

## 2017-01-21 DIAGNOSIS — T7840XD Allergy, unspecified, subsequent encounter: Secondary | ICD-10-CM | POA: Diagnosis not present

## 2017-01-21 DIAGNOSIS — E6609 Other obesity due to excess calories: Secondary | ICD-10-CM | POA: Diagnosis not present

## 2017-01-21 DIAGNOSIS — E559 Vitamin D deficiency, unspecified: Secondary | ICD-10-CM

## 2017-01-21 DIAGNOSIS — L299 Pruritus, unspecified: Secondary | ICD-10-CM | POA: Diagnosis not present

## 2017-01-21 DIAGNOSIS — R2681 Unsteadiness on feet: Secondary | ICD-10-CM

## 2017-01-21 DIAGNOSIS — E079 Disorder of thyroid, unspecified: Secondary | ICD-10-CM | POA: Diagnosis not present

## 2017-01-21 HISTORY — DX: Pruritus, unspecified: L29.9

## 2017-01-21 LAB — CBC WITH DIFFERENTIAL/PLATELET
BASOS ABS: 0 10*3/uL (ref 0.0–0.1)
BASOS PCT: 0.7 % (ref 0.0–3.0)
Eosinophils Absolute: 0.5 10*3/uL (ref 0.0–0.7)
Eosinophils Relative: 10 % — ABNORMAL HIGH (ref 0.0–5.0)
HEMATOCRIT: 43.4 % (ref 36.0–46.0)
Hemoglobin: 14.3 g/dL (ref 12.0–15.0)
LYMPHS PCT: 16.6 % (ref 12.0–46.0)
Lymphs Abs: 0.9 10*3/uL (ref 0.7–4.0)
MCHC: 33 g/dL (ref 30.0–36.0)
MCV: 90.8 fl (ref 78.0–100.0)
MONOS PCT: 10.3 % (ref 3.0–12.0)
Monocytes Absolute: 0.6 10*3/uL (ref 0.1–1.0)
NEUTROS ABS: 3.4 10*3/uL (ref 1.4–7.7)
Neutrophils Relative %: 62.4 % (ref 43.0–77.0)
PLATELETS: 266 10*3/uL (ref 150.0–400.0)
RBC: 4.78 Mil/uL (ref 3.87–5.11)
RDW: 14.1 % (ref 11.5–15.5)
WBC: 5.4 10*3/uL (ref 4.0–10.5)

## 2017-01-21 LAB — COMPREHENSIVE METABOLIC PANEL
ALT: 14 U/L (ref 0–35)
AST: 13 U/L (ref 0–37)
Albumin: 4 g/dL (ref 3.5–5.2)
Alkaline Phosphatase: 57 U/L (ref 39–117)
BUN: 15 mg/dL (ref 6–23)
CALCIUM: 9.6 mg/dL (ref 8.4–10.5)
CHLORIDE: 106 meq/L (ref 96–112)
CO2: 26 meq/L (ref 19–32)
Creatinine, Ser: 0.76 mg/dL (ref 0.40–1.20)
GFR: 78.65 mL/min (ref >60.00)
Glucose, Bld: 103 mg/dL — ABNORMAL HIGH (ref 70–99)
Potassium: 3.9 mEq/L (ref 3.5–5.1)
Sodium: 140 mEq/L (ref 135–145)
Total Bilirubin: 0.4 mg/dL (ref 0.2–1.2)
Total Protein: 6.8 g/dL (ref 6.0–8.3)

## 2017-01-21 LAB — T4, FREE: FREE T4: 1.17 ng/dL (ref 0.60–1.60)

## 2017-01-21 LAB — TSH: TSH: 0.15 u[IU]/mL — ABNORMAL LOW (ref 0.35–4.50)

## 2017-01-21 LAB — SEDIMENTATION RATE: Sed Rate: 14 mm/hr (ref 0–30)

## 2017-01-21 LAB — VITAMIN D 25 HYDROXY (VIT D DEFICIENCY, FRACTURES): VITD: 44.17 ng/mL (ref 30.00–100.00)

## 2017-01-21 NOTE — Assessment & Plan Note (Signed)
Check level today 

## 2017-01-21 NOTE — Progress Notes (Signed)
Subjective:  I acted as a Education administrator for Dr. Charlett Blake. [name], [place]  Patient ID: Felicia Lowe, female    DOB: [birth year], 76 y.o.   MRN: [id number]  No chief complaint on file.   HPI  Patient is in today for a 3 month follow up. She continues to struggle with hives and pruritus in groin for several months. She has been seen by allergist and rheumatoligst so far with no answers. She denies any new products or clothes. No recent febrile illness or hospitalizations. Denies CP/palp/SOB/HA/congestion/fevers/GI or GU c/o. Taking meds as prescribed  Patient Care Team: Mosie Lukes, MD as PCP - General (Family Medicine) Stephannie Li, [place] as Consulting Physician (Ophthalmology) Valinda Party, MD as Consulting Physician (Rheumatology)   Past Medical History:  Diagnosis Date  . Allergy   . Arthritis    osteoarthritis, severe, b/l knees &b/l hips  replaced  . BPV (benign positional vertigo) 02/19/2013   mild  . Chicken pox as a child  . GERD (gastroesophageal reflux disease) 05/27/2014  . H/O breast biopsy    benign, on left  . Hypertension   . Kidney stone 2000   nephritis as a child  . Measles as a child  . Mumps as a child  . Obesity   . Osteoporosis   . Preventative health care 11/25/2014  . Pruritus 01/21/2017  . Skin lesion of face 05/18/2012  . Thyroid disease   . Unsteady gait 11/25/2014  . UTI (urinary tract infection) 12/11/2012  . Viral respiratory illness 05/21/2013  . Vitamin D deficiency     Past Surgical History:  Procedure Laterality Date  . CATARACT EXTRACTION Left 2017  . HIP SURGERY     both hips  . KNEE SURGERY     both knees replaced    Family History  Problem Relation Age of Onset  . Hyperlipidemia Mother   . Hypertension Mother   . Heart attack Mother   . Cancer Father        lung- smoker  . Heart attack Father        X 2  . Thyroid disease Brother   . Hypertension Son   . Cancer Paternal Grandmother     Social History   Social History    . Marital status: Unknown    Spouse name: N/A  . Number of children: N/A  . Years of education: N/A   Occupational History  . Not on file.   Social History Main Topics  . Smoking status: Never Smoker  . Smokeless tobacco: Never Used  . Alcohol use Yes     Comment: Occasional glass of wine  . Drug use: No  . Sexual activity: No   Other Topics Concern  . Not on file   Social History Narrative  . No narrative on file    Outpatient Medications Prior to Visit  Medication Sig Dispense Refill  . Ascorbic Acid (VITAMIN C) 1000 MG tablet Take 1,000 mg by mouth daily.    Marland Kitchen aspirin 81 MG tablet Take 81 mg by mouth daily.    . Cholecalciferol (VITAMIN D) 2000 UNITS tablet Take 2,000 Units by mouth daily.    . Coenzyme Q10 (CO Q 10) 100 MG CAPS Take 400 mg by mouth daily.    . furosemide (LASIX) 20 MG tablet Take 1 tablet (20 mg total) by mouth daily as needed. 30 tablet 4  . lisinopril-hydrochlorothiazide (PRINZIDE,ZESTORETIC) 10-12.5 MG tablet TAKE 1 TABLET BY MOUTH DAILY. 90 tablet 1  .  Omega-3 Fatty Acids (FISH OIL) 1000 MG CPDR Take 1 capsule by mouth daily.    . Probiotic Product (PROBIOTIC DAILY PO) Take by mouth.    . SYNTHROID 150 MCG tablet 1 tab po daily x 6 days a week 30 tablet 4  . triamcinolone cream (KENALOG) 0.1 % Apply 1 application topically 2 (two) times daily. 85.2 g 3  . albuterol (PROAIR HFA) 108 (90 Base) MCG/ACT inhaler Inhale 2 puffs into the lungs every 4 (four) hours as needed for wheezing or shortness of breath. (Patient not taking: Reported on 10/19/2016) 1 Inhaler 0  . ketoconazole (NIZORAL) 2 % cream Apply 1 application topically daily. (Patient not taking: Reported on 10/19/2016) 30 g 0  . SYNTHROID 150 MCG tablet TAKE 1 TABLET BY MOUTH EVERY DAY BEFORE BREAKFAST. (Patient not taking: Reported on 10/19/2016) 30 tablet 4   No facility-administered medications prior to visit.     Allergies  Allergen Reactions  . Eggs Or Egg-Derived Products Shortness Of  Breath and Rash  . Sulfa Antibiotics Nausea And Vomiting  . Keflex [Cephalexin] Rash    Rash   . Penicillins Rash  . Poison Ivy Extract [Poison Ivy Extract] Rash    Review of Systems  Constitutional: Positive for malaise/fatigue. Negative for fever.  HENT: Negative for congestion.   Eyes: Negative for blurred vision.  Respiratory: Negative for cough and shortness of breath.   Cardiovascular: Negative for chest pain, palpitations and leg swelling.  Gastrointestinal: Negative for vomiting.  Musculoskeletal: Negative for back pain.  Skin: Positive for itching and rash.  Neurological: Positive for dizziness. Negative for loss of consciousness and headaches.       Objective:    Physical Exam  Constitutional: She is oriented to person, place, and time. She appears well-developed and well-nourished. No distress.  HENT:  Head: Normocephalic and atraumatic.  Eyes: Conjunctivae are normal.  Neck: Normal range of motion. No thyromegaly present.  Cardiovascular: Normal rate and regular rhythm.   Murmur heard. Pulmonary/Chest: Effort normal and breath sounds normal. She has no wheezes.  Abdominal: Soft. Bowel sounds are normal. There is no tenderness.  Musculoskeletal: Normal range of motion. She exhibits no edema or deformity.  Neurological: She is alert and oriented to person, place, and time.  Skin: Skin is warm and dry. Rash noted. She is not diaphoretic.  Urticarial lesions in inguinal region  Psychiatric: She has a normal mood and affect.    BP 120/78 (BP Location: Left Arm, Patient Position: Sitting, Cuff Size: Normal)   Pulse 77   Temp 98.2 F (36.8 C) (Oral)   Resp 18   Wt 188 lb 3.2 oz (85.4 kg)   SpO2 98%   BMI 34.42 kg/m  Wt Readings from Last 3 Encounters:  01/21/17 188 lb 3.2 oz (85.4 kg)  10/19/16 193 lb (87.5 kg)  07/10/16 195 lb (88.5 kg)   BP Readings from Last 3 Encounters:  01/21/17 120/78  10/19/16 138/66  07/10/16 116/80     Immunization History    Administered Date(s) Administered  . Influenza, Quadrivalent, Recombinant, Inj, Pf 05/15/2016  . Influenza,trivalent, recombinat, inj, PF 05/17/2014, 05/07/2015  . Pneumococcal Conjugate-13 03/06/2016  . Pneumococcal Polysaccharide-23 07/06/2010  . Td 03/06/2016  . Tdap 07/06/2001  . Zoster 04/06/2011    Health Maintenance  Topic Date Due  . DEXA SCAN  02/13/2006  . INFLUENZA VACCINE  02/03/2017  . Fecal DNA (Cologuard)  10/31/2019  . TETANUS/TDAP  03/06/2026  . PNA vac Low Risk Adult  Completed  Lab Results  Component Value Date   WBC 5.4 01/21/2017   HGB 14.3 01/21/2017   HCT 43.4 01/21/2017   PLT 266.0 01/21/2017   GLUCOSE 103 (H) 01/21/2017   CHOL 145 02/16/2013   TRIG 117 02/16/2013   HDL 46 02/16/2013   LDLCALC 76 02/16/2013   ALT 14 01/21/2017   AST 13 01/21/2017   NA 140 01/21/2017   K 3.9 01/21/2017   CL 106 01/21/2017   CREATININE 0.76 01/21/2017   BUN 15 01/21/2017   CO2 26 01/21/2017   TSH 0.15 (L) 01/21/2017    Lab Results  Component Value Date   TSH 0.15 (L) 01/21/2017   Lab Results  Component Value Date   WBC 5.4 01/21/2017   HGB 14.3 01/21/2017   HCT 43.4 01/21/2017   MCV 90.8 01/21/2017   PLT 266.0 01/21/2017   Lab Results  Component Value Date   NA 140 01/21/2017   K 3.9 01/21/2017   CO2 26 01/21/2017   GLUCOSE 103 (H) 01/21/2017   BUN 15 01/21/2017   CREATININE 0.76 01/21/2017   BILITOT 0.4 01/21/2017   ALKPHOS 57 01/21/2017   AST 13 01/21/2017   ALT 14 01/21/2017   PROT 6.8 01/21/2017   ALBUMIN 4.0 01/21/2017   CALCIUM 9.6 01/21/2017   GFR 78.65 01/21/2017   Lab Results  Component Value Date   CHOL 145 02/16/2013   Lab Results  Component Value Date   HDL 46 02/16/2013   Lab Results  Component Value Date   LDLCALC 76 02/16/2013   Lab Results  Component Value Date   TRIG 117 02/16/2013   Lab Results  Component Value Date   CHOLHDL 3.2 02/16/2013   No results found for: HGBA1C       Assessment & Plan:    Problem List Items Addressed This Visit    Thyroid disease    Check TSH and free T4 toay      Relevant Orders   TSH (Completed)   T4, free (Completed)   Vitamin D deficiency    Check level today      Relevant Orders   VITAMIN D 25 Hydroxy (Vit-D Deficiency, Fractures) (Completed)   Obesity    Encouraged DASH diet, decrease po intake and increase exercise as tolerated. Needs 7-8 hours of sleep nightly. Avoid trans fats, eat small, frequent meals every 4-5 hours with lean proteins, complex carbs and healthy fats. Minimize simple carbs, bariatric referral      Allergic state    Struggling with recurrent hives, seeing an allergist and needs labs      Relevant Orders   Sedimentation rate (Completed)   Comprehensive metabolic panel (Completed)   Unsteady gait    Has been seeing chiropractic and doing better      Relevant Orders   CBC with Differential/Platelet (Completed)   Pruritus    Has been seen by allergist, Dr Raylene Everts and rheumatology and is undergoing testing start Zyrtec and Zantac bid, check labs requested by her allergist and report worsening symptoms         I have discontinued Ms. Melchior's albuterol and ketoconazole. I am also having her maintain her vitamin C, Vitamin D, Co Q 10, Fish Oil, aspirin, Probiotic Product (PROBIOTIC DAILY PO), triamcinolone cream, furosemide, SYNTHROID, and lisinopril-hydrochlorothiazide.  No orders of the defined types were placed in this encounter.   CMA served as Education administrator during this visit. History, Physical and Plan performed by medical provider. Documentation and orders reviewed and attested to.  Erline Levine  Charlett Blake, MD

## 2017-01-21 NOTE — Assessment & Plan Note (Addendum)
Has been seen by allergist, Dr Raylene Everts and rheumatology and is undergoing testing start Zyrtec and Zantac bid, check labs requested by her allergist and report worsening symptoms

## 2017-01-21 NOTE — Assessment & Plan Note (Signed)
Encouraged DASH diet, decrease po intake and increase exercise as tolerated. Needs 7-8 hours of sleep nightly. Avoid trans fats, eat small, frequent meals every 4-5 hours with lean proteins, complex carbs and healthy fats. Minimize simple carbs, bariatric referral 

## 2017-01-21 NOTE — Assessment & Plan Note (Signed)
Check TSH and free T4 toay

## 2017-01-21 NOTE — Assessment & Plan Note (Signed)
Has been seeing chiropractic and doing better

## 2017-01-21 NOTE — Assessment & Plan Note (Signed)
Struggling with recurrent hives, seeing an allergist and needs labs

## 2017-01-21 NOTE — Patient Instructions (Signed)
Exercising to Lose Weight Exercising can help you to lose weight. In order to lose weight through exercise, you need to do vigorous-intensity exercise. You can tell that you are exercising with vigorous intensity if you are breathing very hard and fast and cannot hold a conversation while exercising. Moderate-intensity exercise helps to maintain your current weight. You can tell that you are exercising at a moderate level if you have a higher heart rate and faster breathing, but you are still able to hold a conversation. How often should I exercise? Choose an activity that you enjoy and set realistic goals. Your health care provider can help you to make an activity plan that works for you. Exercise regularly as directed by your health care provider. This may include:  Doing resistance training twice each week, such as: ? Push-ups. ? Sit-ups. ? Lifting weights. ? Using resistance bands.  Doing a given intensity of exercise for a given amount of time. Choose from these options: ? 150 minutes of moderate-intensity exercise every week. ? 75 minutes of vigorous-intensity exercise every week. ? A mix of moderate-intensity and vigorous-intensity exercise every week.  Children, pregnant women, people who are out of shape, people who are overweight, and older adults may need to consult a health care provider for individual recommendations. If you have any sort of medical condition, be sure to consult your health care provider before starting a new exercise program. What are some activities that can help me to lose weight?  Walking at a rate of at least 4.5 miles an hour.  Jogging or running at a rate of 5 miles per hour.  Biking at a rate of at least 10 miles per hour.  Lap swimming.  Roller-skating or in-line skating.  Cross-country skiing.  Vigorous competitive sports, such as football, basketball, and soccer.  Jumping rope.  Aerobic dancing. How can I be more active in my day-to-day  activities?  Use the stairs instead of the elevator.  Take a walk during your lunch break.  If you drive, park your car farther away from work or school.  If you take public transportation, get off one stop early and walk the rest of the way.  Make all of your phone calls while standing up and walking around.  Get up, stretch, and walk around every 30 minutes throughout the day. What guidelines should I follow while exercising?  Do not exercise so much that you hurt yourself, feel dizzy, or get very short of breath.  Consult your health care provider prior to starting a new exercise program.  Wear comfortable clothes and shoes with good support.  Drink plenty of water while you exercise to prevent dehydration or heat stroke. Body water is lost during exercise and must be replaced.  Work out until you breathe faster and your heart beats faster. This information is not intended to replace advice given to you by your health care provider. Make sure you discuss any questions you have with your health care provider. Document Released: 07/25/2010 Document Revised: 11/28/2015 Document Reviewed: 11/23/2013 Elsevier Interactive Patient Education  2018 [employer]  

## 2017-01-28 ENCOUNTER — Ambulatory Visit: Payer: Medicare HMO | Admitting: Family Medicine

## 2017-02-03 ENCOUNTER — Other Ambulatory Visit: Payer: Self-pay | Admitting: *Deleted

## 2017-02-03 DIAGNOSIS — E079 Disorder of thyroid, unspecified: Secondary | ICD-10-CM

## 2017-02-03 MED ORDER — SYNTHROID 150 MCG PO TABS: ORAL_TABLET | ORAL | 0 refills | Status: DC

## 2017-02-03 NOTE — Progress Notes (Signed)
Faxed refill request received from [facility] for Synthroid 150 mcg tab [request 90-day supply] Last filled by MD on [only showing as Historical med], take one PO 6 days a week; Rx requesting for one daily] Last AEX - 01/21/17 Labs - T4, free  Status:  Final result Dx:  Thyroid disease  Notes recorded by Mosie Lukes, MD on 01/21/2017 at 8:47 PM EDT Labs look good, tsh is suppressed but the free T4 is normal so no changes   Called and spoke with patient and she stated that she was taking the "6 days a week" and she "she does as Dr. Charlett Blake tells her to"; informed her that I was going to go ahead and send in #90 but to use the instructions given [6 days a week] unless Dr. Charlett Blake were to tell her differently when she speaks with her next. I will note to pharmacy to Hold until due next month per patient request. Patient understood & agreed/SLS 08/01

## 2017-02-19 ENCOUNTER — Encounter: Payer: Self-pay | Admitting: Family Medicine

## 2017-02-20 ENCOUNTER — Other Ambulatory Visit: Payer: Self-pay | Admitting: Family Medicine

## 2017-02-20 DIAGNOSIS — B372 Candidiasis of skin and nail: Secondary | ICD-10-CM

## 2017-02-20 MED ORDER — TRIAMCINOLONE ACETONIDE 0.1 % EX CREA: 1.0000 "application " | TOPICAL_CREAM | Freq: Two times a day (BID) | CUTANEOUS | 3 refills | Status: DC

## 2017-02-22 ENCOUNTER — Other Ambulatory Visit: Payer: Self-pay | Admitting: Family Medicine

## 2017-02-22 ENCOUNTER — Encounter: Payer: Self-pay | Admitting: Family Medicine

## 2017-02-22 DIAGNOSIS — B372 Candidiasis of skin and nail: Secondary | ICD-10-CM

## 2017-02-22 DIAGNOSIS — L501 Idiopathic urticaria: Secondary | ICD-10-CM | POA: Diagnosis not present

## 2017-02-22 DIAGNOSIS — R21 Rash and other nonspecific skin eruption: Secondary | ICD-10-CM | POA: Diagnosis not present

## 2017-02-22 DIAGNOSIS — J309 Allergic rhinitis, unspecified: Secondary | ICD-10-CM | POA: Diagnosis not present

## 2017-02-22 DIAGNOSIS — H1045 Other chronic allergic conjunctivitis: Secondary | ICD-10-CM | POA: Diagnosis not present

## 2017-02-22 MED ORDER — TRIAMCINOLONE ACETONIDE 0.1 % EX CREA: 1.0000 "application " | TOPICAL_CREAM | Freq: Two times a day (BID) | CUTANEOUS | 3 refills | Status: DC

## 2017-02-24 DIAGNOSIS — R21 Rash and other nonspecific skin eruption: Secondary | ICD-10-CM | POA: Diagnosis not present

## 2017-02-24 DIAGNOSIS — L501 Idiopathic urticaria: Secondary | ICD-10-CM | POA: Diagnosis not present

## 2017-02-24 DIAGNOSIS — J309 Allergic rhinitis, unspecified: Secondary | ICD-10-CM | POA: Diagnosis not present

## 2017-02-24 DIAGNOSIS — H1045 Other chronic allergic conjunctivitis: Secondary | ICD-10-CM | POA: Diagnosis not present

## 2017-02-26 ENCOUNTER — Telehealth: Payer: Self-pay | Admitting: Family Medicine

## 2017-02-26 DIAGNOSIS — R21 Rash and other nonspecific skin eruption: Secondary | ICD-10-CM | POA: Diagnosis not present

## 2017-02-26 DIAGNOSIS — H1045 Other chronic allergic conjunctivitis: Secondary | ICD-10-CM | POA: Diagnosis not present

## 2017-02-26 DIAGNOSIS — J309 Allergic rhinitis, unspecified: Secondary | ICD-10-CM | POA: Diagnosis not present

## 2017-02-26 DIAGNOSIS — L501 Idiopathic urticaria: Secondary | ICD-10-CM | POA: Diagnosis not present

## 2017-02-26 NOTE — Telephone Encounter (Signed)
Pt called in to request to have her results faxed to her allergist - Dr. Carrie Mew winkle    She said that he is with [facility] but they are on a different system.

## 2017-03-01 ENCOUNTER — Ambulatory Visit (INDEPENDENT_AMBULATORY_CARE_PROVIDER_SITE_OTHER): Payer: Medicare HMO | Admitting: Family Medicine

## 2017-03-01 ENCOUNTER — Encounter: Payer: Self-pay | Admitting: Family Medicine

## 2017-03-01 VITALS — BP 110/62 | HR 93 | Temp 98.6°F | Ht 62.0 in | Wt 185.4 lb

## 2017-03-01 DIAGNOSIS — L509 Urticaria, unspecified: Secondary | ICD-10-CM | POA: Diagnosis not present

## 2017-03-01 MED ORDER — PREDNISONE 10 MG PO TABS: ORAL_TABLET | ORAL | 0 refills | Status: DC

## 2017-03-01 MED ORDER — METHYLPREDNISOLONE ACETATE 80 MG/ML IJ SUSP
80.0000 mg | Freq: Once | INTRAMUSCULAR | Status: AC
  Administered 2017-03-01: 80 mg via INTRAMUSCULAR

## 2017-03-01 NOTE — Patient Instructions (Signed)
Hives Hives (urticaria) are itchy, red, swollen areas on your skin. Hives can appear on any part of your body and can vary in size. They can be as small as the tip of a pen or much larger. Hives often fade within 24 hours (acute hives). In other cases, new hives appear after old ones fade. This cycle can continue for several days or weeks (chronic hives). Hives result from your body's reaction to an irritant or to something that you are allergic to (trigger). When you are exposed to a trigger, your body releases a chemical (histamine) that causes redness, itching, and swelling. You can get hives immediately after being exposed to a trigger or hours later. Hives do not spread from person to person (are not contagious). Your hives may get worse with scratching, exercise, and emotional stress. What are the causes? Causes of this condition include:  Allergies to certain foods or ingredients.  Insect bites or stings.  Exposure to pollen or pet dander.  Contact with latex or chemicals.  Spending time in sunlight, heat, or cold (exposure).  Exercise.  Stress. You can also get hives from some medical conditions and treatments. These include:  Viruses, including the common cold.  Bacterial infections, such as urinary tract infections and strep throat.  Disorders such as vasculitis, lupus, or thyroid disease.  Certain medications.  Allergy shots.  Blood transfusions. Sometimes, the cause of hives is not known (idiopathic hives). What increases the risk? This condition is more likely to develop in:  Women.  People who have food allergies, especially to citrus fruits, milk, eggs, peanuts, tree nuts, or shellfish.  People who are allergic to:  Medicines.  Latex.  Insects.  Animals.  Pollen.  People who have certain medical conditions, includinglupus or thyroid disease. What are the signs or symptoms? The main symptom of this condition is raised, itchyred or white bumps or  patches on your skin. These areas may:  Become large and swollen (welts).  Change in shape and location, quickly and repeatedly.  Be separate hives or connect over a large area of skin.  Sting or become painful.  Turn white when pressed in the center (blanch). In severe cases, yourhands, feet, and face may also become swollen. This may occur if hives develop deeper in your skin. How is this diagnosed? This condition is diagnosed based on your symptoms, medical history, and physical exam. Your skin, urine, or blood may be tested to find out what is causing your hives and to rule out other health issues. Your health care provider may also remove a small sample of skin from the affected area and examine it under a microscope (biopsy). How is this treated? Treatment depends on the severity of your condition. Your health care provider may recommend using cool, wet cloths (cool compresses) or taking cool showers to relieve itching. Hives are sometimes treated with medicines, including:  Antihistamines.  Corticosteroids.  Antibiotics.  An injectable medicine (omalizumab). Your health care provider may prescribe this if you have chronic idiopathic hives and you continue to have symptoms even after treatment with antihistamines. Severe cases may require an emergency injection of adrenaline (epinephrine) to prevent a life-threatening allergic reaction (anaphylaxis). Follow these instructions at home: Medicines  Take or apply over-the-counter and prescription medicines only as told by your health care provider.  If you were prescribed an antibiotic medicine, use it as told by your health care provider. Do not stop taking the antibiotic even if you start to feel better. Skin Care    Apply cool compresses to the affected areas.  Do not scratch or rub your skin. General instructions  Do not take hot showers or baths. This can make itching worse.  Do not wear tight-fitting clothing.  Use  sunscreen and wear protective clothing when you are outside.  Avoid any substances that cause your hives. Keep a journal to help you track what causes your hives. Write down:  What medicines you take.  What you eat and drink.  What products you use on your skin.  Keep all follow-up visits as told by your health care provider. This is important. Contact a health care provider if:  Your symptoms are not controlled with medicine.  Your joints are painful or swollen. Get help right away if:  You have a fever.  You have pain in your abdomen.  Your tongue or lips are swollen.  Your eyelids are swollen.  Your chest or throat feels tight.  You have trouble breathing or swallowing. These symptoms may represent a serious problem that is an emergency. Do not wait to see if the symptoms will go away. Get medical help right away. Call your local emergency services (911 in the [place] Do not drive yourself to the hospital.  This information is not intended to replace advice given to you by your health care provider. Make sure you discuss any questions you have with your health care provider. Document Released: 06/22/2005 Document Revised: 11/20/2015 Document Reviewed: 04/10/2015 Elsevier Interactive Patient Education  2017 [employer]  

## 2017-03-01 NOTE — Telephone Encounter (Signed)
Results faxed to Dr. Harold Hedge as requested.

## 2017-03-01 NOTE — Progress Notes (Signed)
Patient ID: Felicia Lowe, female    DOB: [birth year]  Age: 76 y.o. MRN: [id number]    Subjective:  Subjective  HPI Felicia Lowe presents for rash since January-- she has seen her pcp, rheum, allergist and dermatologist.  She is on 2 zyrtec in am and 2 hydroxyzine pm --- no relief.  She has an appointment with allergist at [facility] in October.    Review of Systems  Constitutional: Negative for chills and fever.  HENT: Negative for congestion and hearing loss.   Eyes: Negative for discharge.  Respiratory: Negative for cough and shortness of breath.   Cardiovascular: Negative for chest pain, palpitations and leg swelling.  Gastrointestinal: Negative for abdominal pain, blood in stool, constipation, diarrhea, nausea and vomiting.  Genitourinary: Negative for dysuria, frequency, hematuria and urgency.  Musculoskeletal: Negative for back pain and myalgias.  Skin: Negative for rash.  Allergic/Immunologic: Negative for environmental allergies.  Neurological: Negative for dizziness, weakness and headaches.  Hematological: Does not bruise/bleed easily.  Psychiatric/Behavioral: Negative for suicidal ideas. The patient is not nervous/anxious.     History Past Medical History:  Diagnosis Date  . Allergy   . Arthritis    osteoarthritis, severe, b/l knees &b/l hips  replaced  . BPV (benign positional vertigo) 02/19/2013   mild  . Chicken pox as a child  . GERD (gastroesophageal reflux disease) 05/27/2014  . H/O breast biopsy    benign, on left  . Hypertension   . Kidney stone 2000   nephritis as a child  . Measles as a child  . Mumps as a child  . Obesity   . Osteoporosis   . Preventative health care 11/25/2014  . Pruritus 01/21/2017  . Skin lesion of face 05/18/2012  . Thyroid disease   . Unsteady gait 11/25/2014  . UTI (urinary tract infection) 12/11/2012  . Viral respiratory illness 05/21/2013  . Vitamin D deficiency     She has a past surgical history that includes Knee surgery;  Hip surgery; and Cataract extraction (Left, 2017).   Her family history includes Cancer in her father and paternal grandmother; Heart attack in her father and mother; Hyperlipidemia in her mother; Hypertension in her mother and son; Thyroid disease in her brother.She reports that she has never smoked. She has never used smokeless tobacco. She reports that she drinks alcohol. She reports that she does not use drugs.  Current Outpatient Prescriptions on File Prior to Visit  Medication Sig Dispense Refill  . Ascorbic Acid (VITAMIN C) 1000 MG tablet Take 1,000 mg by mouth daily.    Marland Kitchen aspirin 81 MG tablet Take 81 mg by mouth daily.    . Cholecalciferol (VITAMIN D) 2000 UNITS tablet Take 2,000 Units by mouth daily.    . Coenzyme Q10 (CO Q 10) 100 MG CAPS Take 400 mg by mouth daily.    . furosemide (LASIX) 20 MG tablet Take 1 tablet (20 mg total) by mouth daily as needed. 30 tablet 4  . lisinopril-hydrochlorothiazide (PRINZIDE,ZESTORETIC) 10-12.5 MG tablet TAKE 1 TABLET BY MOUTH DAILY. 90 tablet 1  . Omega-3 Fatty Acids (FISH OIL) 1000 MG CPDR Take 1 capsule by mouth daily.    . Probiotic Product (PROBIOTIC DAILY PO) Take by mouth.    . SYNTHROID 150 MCG tablet 1 tab po daily x 6 days a week 90 tablet 0  . triamcinolone cream (KENALOG) 0.1 % Apply 1 application topically 2 (two) times daily. 453.6 g 3   No current facility-administered medications on file prior to visit.  Objective:  Objective  Physical Exam  Skin: Rash noted. Rash is urticarial.     Nursing note and vitals reviewed.    BP 110/62   Pulse 93   Temp 98.6 F (37 C) (Oral)   Ht 5\' 2"  (1.575 m)   Wt 185 lb 6.4 oz (84.1 kg)   SpO2 97%   BMI 33.91 kg/m  Wt Readings from Last 3 Encounters:  03/01/17 185 lb 6.4 oz (84.1 kg)  01/21/17 188 lb 3.2 oz (85.4 kg)  10/19/16 193 lb (87.5 kg)     Lab Results  Component Value Date   WBC 5.4 01/21/2017   HGB 14.3 01/21/2017   HCT 43.4 01/21/2017   PLT 266.0 01/21/2017    GLUCOSE 103 (H) 01/21/2017   CHOL 145 02/16/2013   TRIG 117 02/16/2013   HDL 46 02/16/2013   LDLCALC 76 02/16/2013   ALT 14 01/21/2017   AST 13 01/21/2017   NA 140 01/21/2017   K 3.9 01/21/2017   CL 106 01/21/2017   CREATININE 0.76 01/21/2017   BUN 15 01/21/2017   CO2 26 01/21/2017   TSH 0.15 (L) 01/21/2017    Ct Head Wo Contrast  Result Date: 11/17/2012 *RADIOLOGY REPORT* Clinical Data: Dizziness. CT HEAD WITHOUT CONTRAST Technique:  Contiguous axial images were obtained from the base of the skull through the vertex without contrast. Comparison: None. Findings: No acute intracranial abnormality.  Specifically, no hemorrhage, hydrocephalus, mass lesion, acute infarction, or significant intracranial injury.  No acute calvarial abnormality. Visualized paranasal sinuses and mastoids clear.  Orbital soft tissues unremarkable. IMPRESSION: No acute intracranial abnormality. Original Report Authenticated By: Rolm Baptise, M.D.     Assessment & Plan:  Plan  I am having Ms. Britz start on predniSONE. I am also having her maintain her vitamin C, Vitamin D, Co Q 10, Fish Oil, aspirin, Probiotic Product (PROBIOTIC DAILY PO), furosemide, lisinopril-hydrochlorothiazide, SYNTHROID, triamcinolone cream, and hydrOXYzine. We administered methylPREDNISolone acetate.  Meds ordered this encounter  Medications  . hydrOXYzine (ATARAX/VISTARIL) 10 MG tablet    Sig: Take 2 tablets by mouth 2 (two) times daily.  . predniSONE (DELTASONE) 10 MG tablet    Sig: TAKE 3 TABLETS PO QD FOR 3 DAYS THEN TAKE 2 TABLETS PO QD FOR 3 DAYS THEN TAKE 1 TABLET PO QD FOR 3 DAYS THEN TAKE 1/2 TAB PO QD FOR 3 DAYS    Dispense:  20 tablet    Refill:  0  . methylPREDNISolone acetate (DEPO-MEDROL) injection 80 mg    Problem List Items Addressed This Visit    None    Visit Diagnoses    Hives    -  Primary   Relevant Medications   predniSONE (DELTASONE) 10 MG tablet   methylPREDNISolone acetate (DEPO-MEDROL) injection 80  mg (Completed)    keep f/u allergist at [facility]  Follow-up: Return if symptoms worsen or fail to improve.  Ann Held, DO

## 2017-03-09 ENCOUNTER — Encounter: Payer: Self-pay | Admitting: Family Medicine

## 2017-03-11 ENCOUNTER — Other Ambulatory Visit: Payer: Self-pay

## 2017-03-11 MED ORDER — METOPROLOL SUCCINATE ER 25 MG PO TB24: 25.0000 mg | ORAL_TABLET | Freq: Every day | ORAL | 0 refills | Status: DC

## 2017-03-11 NOTE — Telephone Encounter (Signed)
Per SB Hazelee Harbold/C Lisinopril HCT and Start Metoprolol ER 25mg  1qd #[facility] and pt aware/this is to see if rash is a Reaction to the HCTZ/thx dmf

## 2017-03-16 ENCOUNTER — Other Ambulatory Visit: Payer: Self-pay | Admitting: Family Medicine

## 2017-03-16 ENCOUNTER — Encounter: Payer: Self-pay | Admitting: Family Medicine

## 2017-03-16 MED ORDER — METHYLPREDNISOLONE 4 MG PO TABS: ORAL_TABLET | ORAL | 0 refills | Status: DC

## 2017-03-17 ENCOUNTER — Other Ambulatory Visit: Payer: Self-pay | Admitting: Family Medicine

## 2017-03-17 DIAGNOSIS — E079 Disorder of thyroid, unspecified: Secondary | ICD-10-CM

## 2017-03-19 ENCOUNTER — Encounter: Payer: Self-pay | Admitting: Family Medicine

## 2017-03-23 ENCOUNTER — Encounter: Payer: Self-pay | Admitting: Family Medicine

## 2017-03-23 ENCOUNTER — Ambulatory Visit (INDEPENDENT_AMBULATORY_CARE_PROVIDER_SITE_OTHER): Payer: Medicare HMO | Admitting: Family Medicine

## 2017-03-23 VITALS — BP 127/45 | HR 88 | Temp 97.8°F | Ht 62.0 in | Wt 186.6 lb

## 2017-03-23 DIAGNOSIS — R21 Rash and other nonspecific skin eruption: Secondary | ICD-10-CM

## 2017-03-23 DIAGNOSIS — B9789 Other viral agents as the cause of diseases classified elsewhere: Secondary | ICD-10-CM

## 2017-03-23 DIAGNOSIS — J988 Other specified respiratory disorders: Secondary | ICD-10-CM | POA: Diagnosis not present

## 2017-03-23 MED ORDER — FLUCONAZOLE 100 MG PO TABS: 100.0000 mg | ORAL_TABLET | ORAL | 0 refills | Status: DC

## 2017-03-23 NOTE — Assessment & Plan Note (Signed)
It is possible that this is candidal in nature with a being worse in the skin folds. She has had 3 different rounds of steroids with no improvement. She has been to a dermatologist and a skin biopsy was unrevealing. 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 Her lisinopril was stopped and was started on metoprolol. - Try oral fluconazole once weekly for 4 weeks - Could consider trying an antibiotic if no improvement. - She has follow-up with dermatology scheduled already.

## 2017-03-23 NOTE — Patient Instructions (Signed)
Thank you for coming in,   Please try the medication provided.   Please let us know if there is no improvement.    Please feel free to call with any questions or concerns at any time, at [phone]. --Dr. Raeford Razor

## 2017-03-23 NOTE — Progress Notes (Signed)
Felicia Lowe - 76 y.o. female MRN [id number]  Date of birth: 04/30/1941  SUBJECTIVE:  Including CC & ROS.  Chief Complaint  Patient presents with  . Rash    Patient is here troday C/O a systemic rash after 3 prednisone packs for Tx with last dose being 2d ago. Following last dosage she states that more spots came up and the itching worsened.    Felicia Lowe is a 76 year old female is presenting with a rash. She reports the rash started in January and has progressed since that time. She has been on 3 different courses of steroids with limited improvement. She did have some resolution of her itching with the IM Depo-Medrol. She has been to a dermatologist and has had a skin biopsy which was normal. She reports having significant areas affected in the groin and some of her skin.. She has not tried any topical ointments. She has tried Zantac, Zyrtec, and Atarax for itching. She denies any fevers or chills.   Upon chart review patient has had different dose pack sent. She was seen on 8/27 and was provided prednisone Atarax at that time. She was diagnosed with highs at that time.  Review of Systems  Constitutional: Negative for fever.  Skin: Positive for rash.    HISTORY: Past Medical, Surgical, Social, and Family History Reviewed & Updated per EMR.   Pertinent Historical Findings include:  Past Medical History:  Diagnosis Date  . Allergy   . Arthritis    osteoarthritis, severe, b/l knees &b/l hips  replaced  . BPV (benign positional vertigo) 02/19/2013   mild  . Chicken pox as a child  . GERD (gastroesophageal reflux disease) 05/27/2014  . H/O breast biopsy    benign, on left  . Hypertension   . Kidney stone 2000   nephritis as a child  . Measles as a child  . Mumps as a child  . Obesity   . Osteoporosis   . Preventative health care 11/25/2014  . Pruritus 01/21/2017  . Skin lesion of face 05/18/2012  . Thyroid disease   . Unsteady gait 11/25/2014  . UTI (urinary tract infection)  12/11/2012  . Viral respiratory illness 05/21/2013  . Vitamin D deficiency     Past Surgical History:  Procedure Laterality Date  . CATARACT EXTRACTION Left 2017  . HIP SURGERY     both hips  . KNEE SURGERY     both knees replaced    Allergies  Allergen Reactions  . Eggs Or Egg-Derived Products Shortness Of Breath and Rash  . Sulfa Antibiotics Nausea And Vomiting  . Keflex [Cephalexin] Rash    Rash   . Penicillins Rash  . Poison Ivy Extract [Poison Ivy Extract] Rash    Family History  Problem Relation Age of Onset  . Hyperlipidemia Mother   . Hypertension Mother   . Heart attack Mother   . Cancer Father        lung- smoker  . Heart attack Father        X 2  . Thyroid disease Brother   . Hypertension Son   . Cancer Paternal Grandmother      Social History   Social History  . Marital status: Unknown    Spouse name: N/A  . Number of children: N/A  . Years of education: N/A   Occupational History  . Not on file.   Social History Main Topics  . Smoking status: Never Smoker  . Smokeless tobacco: Never Used  . Alcohol use  Yes     Comment: Occasional glass of wine  . Drug use: No  . Sexual activity: No   Other Topics Concern  . Not on file   Social History Narrative  . No narrative on file     PHYSICAL EXAM:  VS: BP (!) 127/45 (BP Location: Right Arm, Patient Position: Sitting, Cuff Size: Large)   Pulse 88   Temp 97.8 F (36.6 C) (Oral)   Ht 5\' 2"  (1.575 m)   Wt 186 lb 9.6 oz (84.6 kg)   SpO2 96%   BMI 34.13 kg/m  Physical Exam Gen: NAD, alert, cooperative with exam,  ENT: normal lips, normal nasal mucosa,  Eye: normal EOM, normal conjunctiva and lids CV:  no edema, +2 pedal pulses   Resp: no accessory muscle use, non-labored,  GI: no masses or tenderness, no hernia  Skin: Macular ulcerative lesions on her extremities, a red beefy rash in her inguinal folds and some of her skin folds on her abdomen, no areas of induration  Neuro: normal tone,  normal sensation to touch Psych:  normal insight, alert and oriented MSK: Normal gait, normal strength      ASSESSMENT & PLAN:   Rash It is possible that this is candidal in nature with a being worse in the skin folds. She has had 3 different rounds of steroids with no improvement. She has been to a dermatologist and a skin biopsy was unrevealing. 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 Her lisinopril was stopped and was started on metoprolol. - Try oral fluconazole once weekly for 4 weeks - Could consider trying an antibiotic if no improvement. - She has follow-up with dermatology scheduled already.

## 2017-03-31 DIAGNOSIS — E039 Hypothyroidism, unspecified: Secondary | ICD-10-CM | POA: Diagnosis not present

## 2017-03-31 DIAGNOSIS — L299 Pruritus, unspecified: Secondary | ICD-10-CM | POA: Diagnosis not present

## 2017-04-01 ENCOUNTER — Ambulatory Visit (INDEPENDENT_AMBULATORY_CARE_PROVIDER_SITE_OTHER): Payer: Medicare HMO | Admitting: Family Medicine

## 2017-04-01 ENCOUNTER — Encounter: Payer: Self-pay | Admitting: Family Medicine

## 2017-04-01 ENCOUNTER — Ambulatory Visit: Payer: Medicare HMO

## 2017-04-01 VITALS — BP 135/79 | HR 64 | Temp 97.8°F | Ht 62.0 in | Wt 185.0 lb

## 2017-04-01 DIAGNOSIS — I10 Essential (primary) hypertension: Secondary | ICD-10-CM | POA: Diagnosis not present

## 2017-04-01 DIAGNOSIS — R079 Chest pain, unspecified: Secondary | ICD-10-CM

## 2017-04-01 DIAGNOSIS — R600 Localized edema: Secondary | ICD-10-CM | POA: Diagnosis not present

## 2017-04-01 MED ORDER — NITROGLYCERIN 0.4 MG SL SUBL: 0.4000 mg | SUBLINGUAL_TABLET | SUBLINGUAL | 3 refills | Status: DC | PRN

## 2017-04-01 MED ORDER — LORATADINE 10 MG PO TABS: 10.0000 mg | ORAL_TABLET | Freq: Two times a day (BID) | ORAL | 11 refills | Status: DC | PRN

## 2017-04-01 MED ORDER — LISINOPRIL 10 MG PO TABS: 10.0000 mg | ORAL_TABLET | Freq: Two times a day (BID) | ORAL | 3 refills | Status: DC

## 2017-04-01 NOTE — Progress Notes (Unsigned)
Pre visit review using our clinic tool,if applicable. No additional management support is needed unless otherwise documented below in the visit note.  

## 2017-04-01 NOTE — Progress Notes (Signed)
Pre visit review using our clinic tool,if applicable. No additional management support is needed unless otherwise documented below in the visit note.  

## 2017-04-01 NOTE — Progress Notes (Unsigned)
   Subjective:    Patient ID: Felicia Lowe, female    DOB: 14-Feb-1941, 76 y.o.   MRN: [id number]  HPI    Review of Systems     Objective:   Physical Exam        Assessment & Plan:

## 2017-04-01 NOTE — Progress Notes (Signed)
Subjective:  I acted as a Education administrator for Dr. Rogue Jury, CMA   Patient ID: Felicia Lowe, female    DOB: Nov 13, 1940, 76 y.o.   MRN: [id number]  Chief Complaint  Patient presents with  . Joint Swelling    Pt states that her ankles are swollen. Onset about 3 weeks again  . Medication Reaction    states that when she switched Rx her ankles began to swell   . Chest Pain    Pt states that she's having episodes of "a grabbing feeling in her chest" Last for about 5 minutes and comes and goes. Pt states that after she has a drink of water it goes away.     HPI  Patient is in today for swelling in both ankles and intermittent chest pain. She was in today for a nurse blood pressure check but with her symptoms she is converted to an office visit. She feels her symptoms developed after Metoprolol was started. Notes her chest pain is only seconds and has no associated symptoms such as palpitations, SOB. Denies palp/HA/congestion/fevers/GI or GU c/o. Taking meds as prescribed  Patient Care Team: Mosie Lukes, MD as PCP - General (Family Medicine) Stephannie Li, Mark as Consulting Physician (Ophthalmology) Valinda Party, MD as Consulting Physician (Rheumatology) Harold Hedge, Darrick Grinder, MD as Consulting Physician (Allergy and Immunology)   Past Medical History:  Diagnosis Date  . Allergy   . Arthritis    osteoarthritis, severe, b/l knees &b/l hips  replaced  . BPV (benign positional vertigo) 02/19/2013   mild  . Chicken pox as a child  . GERD (gastroesophageal reflux disease) 05/27/2014  . H/O breast biopsy    benign, on left  . Hypertension   . Kidney stone 2000   nephritis as a child  . Measles as a child  . Mumps as a child  . Obesity   . Osteoporosis   . Preventative health care 11/25/2014  . Pruritus 01/21/2017  . Skin lesion of face 05/18/2012  . Thyroid disease   . Unsteady gait 11/25/2014  . UTI (urinary tract infection) 12/11/2012  . Viral respiratory illness 05/21/2013  .  Vitamin D deficiency     Past Surgical History:  Procedure Laterality Date  . CATARACT EXTRACTION Left 2017  . HIP SURGERY     both hips  . KNEE SURGERY     both knees replaced    Family History  Problem Relation Age of Onset  . Hyperlipidemia Mother   . Hypertension Mother   . Heart attack Mother   . Cancer Father        lung- smoker  . Heart attack Father        X 2  . Thyroid disease Brother   . Hypertension Son   . Cancer Paternal Grandmother     Social History   Social History  . Marital status: Unknown    Spouse name: N/A  . Number of children: N/A  . Years of education: N/A   Occupational History  . Not on file.   Social History Main Topics  . Smoking status: Never Smoker  . Smokeless tobacco: Never Used  . Alcohol use Yes     Comment: Occasional glass of wine  . Drug use: No  . Sexual activity: No   Other Topics Concern  . Not on file   Social History Narrative  . No narrative on file    Outpatient Medications Prior to Visit  Medication Sig Dispense Refill  .  Ascorbic Acid (VITAMIN C) 1000 MG tablet Take 1,000 mg by mouth daily.    Marland Kitchen aspirin 81 MG tablet Take 81 mg by mouth daily.    . Cholecalciferol (VITAMIN D) 2000 UNITS tablet Take 2,000 Units by mouth daily.    . Coenzyme Q10 (CO Q 10) 100 MG CAPS Take 400 mg by mouth daily.    . fluconazole (DIFLUCAN) 100 MG tablet Take 1 tablet (100 mg total) by mouth once a week. For a total of 4 weeks. 4 tablet 0  . furosemide (LASIX) 20 MG tablet Take 1 tablet (20 mg total) by mouth daily as needed. 30 tablet 4  . hydrOXYzine (ATARAX/VISTARIL) 10 MG tablet Take 2 tablets by mouth 2 (two) times daily.    . Omega-3 Fatty Acids (FISH OIL) 1000 MG CPDR Take 1 capsule by mouth daily.    . Probiotic Product (PROBIOTIC DAILY PO) Take by mouth.    . triamcinolone cream (KENALOG) 0.1 % Apply 1 application topically 2 (two) times daily. 453.6 g 3  . metoprolol succinate (TOPROL-XL) 25 MG 24 hr tablet Take 1  tablet (25 mg total) by mouth daily. 30 tablet 0  . SYNTHROID 150 MCG tablet 1 tab po daily x 6 days a week 90 tablet 0   No facility-administered medications prior to visit.     Allergies  Allergen Reactions  . Eggs Or Egg-Derived Products Shortness Of Breath and Rash  . Sulfa Antibiotics Nausea And Vomiting  . Keflex [Cephalexin] Rash    Rash   . Penicillins Rash  . Poison Ivy Extract [Poison Ivy Extract] Rash    Review of Systems  Constitutional: Negative for chills, fever and malaise/fatigue.  HENT: Negative for congestion.   Eyes: Negative for discharge.  Respiratory: Negative for sputum production and shortness of breath.   Cardiovascular: Positive for chest pain and leg swelling. Negative for palpitations.  Gastrointestinal: Negative for abdominal pain and constipation.  Genitourinary: Positive for frequency.  Musculoskeletal: Negative for myalgias.  Neurological: Negative for weakness and headaches.  Endo/Heme/Allergies: Negative for environmental allergies.  Psychiatric/Behavioral: Negative for depression.       Objective:    Physical Exam  Constitutional: She is oriented to person, place, and time. She appears well-developed and well-nourished. No distress.  HENT:  Head: Normocephalic and atraumatic.  Nose: Nose normal.  Eyes: Right eye exhibits no discharge. Left eye exhibits no discharge.  Neck: Normal range of motion. Neck supple.  Cardiovascular: Normal rate and regular rhythm.   No murmur heard. Pulmonary/Chest: Effort normal and breath sounds normal.  Abdominal: Soft. Bowel sounds are normal. There is no tenderness.  Musculoskeletal: She exhibits no edema.  Neurological: She is alert and oriented to person, place, and time.  Skin: Skin is warm and dry.  Psychiatric: She has a normal mood and affect.  Nursing note and vitals reviewed.   BP 135/79   Pulse 64   Temp 97.8 F (36.6 C) (Oral)   Ht 5\' 2"  (1.575 m)   Wt 185 lb (83.9 kg)   SpO2 98%    BMI 33.84 kg/m  Wt Readings from Last 3 Encounters:  04/01/17 185 lb (83.9 kg)  03/23/17 186 lb 9.6 oz (84.6 kg)  03/01/17 185 lb 6.4 oz (84.1 kg)   BP Readings from Last 3 Encounters:  04/01/17 135/79  03/23/17 (!) 127/45  03/01/17 110/62     Immunization History  Administered Date(s) Administered  . Influenza, Quadrivalent, Recombinant, Inj, Pf 05/15/2016  . Influenza,trivalent, recombinat, inj, PF  05/17/2014, 05/07/2015  . Pneumococcal Conjugate-13 03/06/2016  . Pneumococcal Polysaccharide-23 07/06/2010  . Td 03/06/2016  . Tdap 07/06/2001  . Zoster 04/06/2011    Health Maintenance  Topic Date Due  . DEXA SCAN  02/13/2006  . INFLUENZA VACCINE  06/01/2017 (Originally 02/03/2017)  . TETANUS/TDAP  03/06/2026  . PNA vac Low Risk Adult  Completed    Lab Results  Component Value Date   WBC 5.4 01/21/2017   HGB 14.3 01/21/2017   HCT 43.4 01/21/2017   PLT 266.0 01/21/2017   GLUCOSE 103 (H) 01/21/2017   CHOL 145 02/16/2013   TRIG 117 02/16/2013   HDL 46 02/16/2013   LDLCALC 76 02/16/2013   ALT 14 01/21/2017   AST 13 01/21/2017   NA 140 01/21/2017   K 3.9 01/21/2017   CL 106 01/21/2017   CREATININE 0.76 01/21/2017   BUN 15 01/21/2017   CO2 26 01/21/2017   TSH 0.15 (L) 01/21/2017    Lab Results  Component Value Date   TSH 0.15 (L) 01/21/2017   Lab Results  Component Value Date   WBC 5.4 01/21/2017   HGB 14.3 01/21/2017   HCT 43.4 01/21/2017   MCV 90.8 01/21/2017   PLT 266.0 01/21/2017   Lab Results  Component Value Date   NA 140 01/21/2017   K 3.9 01/21/2017   CO2 26 01/21/2017   GLUCOSE 103 (H) 01/21/2017   BUN 15 01/21/2017   CREATININE 0.76 01/21/2017   BILITOT 0.4 01/21/2017   ALKPHOS 57 01/21/2017   AST 13 01/21/2017   ALT 14 01/21/2017   PROT 6.8 01/21/2017   ALBUMIN 4.0 01/21/2017   CALCIUM 9.6 01/21/2017   GFR 78.65 01/21/2017   Lab Results  Component Value Date   CHOL 145 02/16/2013   Lab Results  Component Value Date   HDL 46  02/16/2013   Lab Results  Component Value Date   LDLCALC 76 02/16/2013   Lab Results  Component Value Date   TRIG 117 02/16/2013   Lab Results  Component Value Date   CHOLHDL 3.2 02/16/2013   No results found for: HGBA1C       Assessment & Plan:   Problem List Items Addressed This Visit    Essential hypertension    Well controlled, no changes to meds. Encouraged heart healthy diet such as the DASH diet and exercise as tolerated.       Relevant Medications   lisinopril (PRINIVIL,ZESTRIL) 10 MG tablet   nitroGLYCERIN (NITROSTAT) 0.4 MG SL tablet   Other Relevant Orders   Ambulatory referral to Cardiology   Chest pain - Primary    Atypical and recurrent since switching to metoprolol. Will stop metoprolol and refer to cardiology for further consideration. EKG sinus rhythm with no acute ST or T wave changes. Started on ECASA 81 mg daily and given NTG to use prn.       Relevant Orders   EKG 12-Lead (Completed)   Ambulatory referral to Cardiology   Pedal edema    Encouraged to elevate feet, minimize sodium and try compression hose.          I have discontinued Ms. Billiot's SYNTHROID and metoprolol succinate. I am also having her start on lisinopril, nitroGLYCERIN, and loratadine. Additionally, I am having her maintain her vitamin C, Vitamin D, Co Q 10, Fish Oil, aspirin, Probiotic Product (PROBIOTIC DAILY PO), furosemide, triamcinolone cream, hydrOXYzine, fluconazole, and levothyroxine.  Meds ordered this encounter  Medications  . lisinopril (PRINIVIL,ZESTRIL) 10 MG tablet  Sig: Take 1 tablet (10 mg total) by mouth 2 (two) times daily.    Dispense:  90 tablet    Refill:  3  . nitroGLYCERIN (NITROSTAT) 0.4 MG SL tablet    Sig: Place 1 tablet (0.4 mg total) under the tongue every 5 (five) minutes as needed for chest pain.    Dispense:  50 tablet    Refill:  3  . loratadine (CLARITIN) 10 MG tablet    Sig: Take 1 tablet (10 mg total) by mouth 2 (two) times daily as  needed for allergies.    Dispense:  30 tablet    Refill:  11  . levothyroxine (SYNTHROID, LEVOTHROID) 125 MCG tablet    Sig: Take 1 tablet (125 mcg total) by mouth daily.    Dispense:  90 tablet    Refill:  3     Penni Homans, MD CMA served as scribe during this visit. History, Physical and Plan performed by medical provider. Documentation and orders reviewed and attested to.

## 2017-04-01 NOTE — Progress Notes (Unsigned)
Pre visit review using our clinic tool,if applicable. No additional management support is needed unless otherwise documented below in the visit note.   Patient in for BP check per order dated 03/23/2017. Patient was taken off Lisinopril and started on Metoprolol 25 mg to take with Lasix 20 mg prn.   Patient states she has not done well since taking Metoprolol 25 mg. States  she has not taken Lasix  20 mg at all since she started Metoprolol by choice. Complains of swelling in feet and and an ocaisional grabbing in chest that lasts a few seconds. States she continues to have hives. Has appointment upcoming with Dermatologist. Denies SOB.    BP today =  135/79  P=64  Per Dr. Charlett Blake, patient to be seen by her today.

## 2017-04-01 NOTE — Patient Instructions (Signed)

## 2017-04-04 DIAGNOSIS — R0789 Other chest pain: Secondary | ICD-10-CM | POA: Insufficient documentation

## 2017-04-04 DIAGNOSIS — R600 Localized edema: Secondary | ICD-10-CM | POA: Insufficient documentation

## 2017-04-04 NOTE — Assessment & Plan Note (Signed)
Atypical and recurrent since switching to metoprolol. Will stop metoprolol and refer to cardiology for further consideration. EKG sinus rhythm with no acute ST or T wave changes. Started on ECASA 81 mg daily and given NTG to use prn.

## 2017-04-04 NOTE — Assessment & Plan Note (Signed)
Well controlled, no changes to meds. Encouraged heart healthy diet such as the DASH diet and exercise as tolerated.  °

## 2017-04-04 NOTE — Assessment & Plan Note (Signed)
Encouraged to elevate feet, minimize sodium and try compression hose.

## 2017-04-12 ENCOUNTER — Encounter: Payer: Self-pay | Admitting: Cardiology

## 2017-04-12 ENCOUNTER — Ambulatory Visit (INDEPENDENT_AMBULATORY_CARE_PROVIDER_SITE_OTHER): Payer: Medicare HMO | Admitting: Cardiology

## 2017-04-12 ENCOUNTER — Encounter: Payer: Self-pay | Admitting: Family Medicine

## 2017-04-12 VITALS — BP 110/78 | HR 72 | Resp 10 | Ht 62.0 in | Wt 184.1 lb

## 2017-04-12 DIAGNOSIS — I10 Essential (primary) hypertension: Secondary | ICD-10-CM | POA: Diagnosis not present

## 2017-04-12 DIAGNOSIS — M1990 Unspecified osteoarthritis, unspecified site: Secondary | ICD-10-CM | POA: Diagnosis not present

## 2017-04-12 DIAGNOSIS — R600 Localized edema: Secondary | ICD-10-CM | POA: Diagnosis not present

## 2017-04-12 NOTE — Addendum Note (Signed)
Addended by: Kathyrn Sheriff on: 04/12/2017 10:38 AM   Modules accepted: Orders

## 2017-04-12 NOTE — Progress Notes (Signed)
Cardiology Consultation:    Date:  04/12/2017   ID:  Felicia Lowe, DOB [birth year], MRN [id number]  PCP:  Mosie Lukes, MD  Cardiologist:  Jenne Campus, MD   Referring MD: Mosie Lukes, MD   Chief Complaint  Patient presents with  . Chest Pain    Due to medication (Metoprolol)  . Hypertension  I'm having chest pain  History of Present Illness:    Felicia Lowe is a 76 y.o. female who is being seen today for the evaluation of Atypical chest pain at the request of Mosie Lukes, MD. For last few months she drinks experiencing atypical chest pain. Pains happen typically when she was sitting could last for a few seconds sometimes for a few minutes she goes and drink a glass of water and that usually helps. There is no shortness of breath no sweating assisted with the sensation however her daughter told her that she looked very pale when she had those episodes. She attributes those symptoms to changes in her medications. Apparently at her lisinopril has been discontinued she was put on metoprolol however lately and she was put back on lisinopril and she seems to be doing better since that time. In spite of the fact of both hips and both knee replaced she is fairly active. She has 4 acres of land up to cut grass and has no difficulty doing that. Denies having exertional chest pain tightness squeezing pressure burning in the chest. She described is as short tightness like without radiation. She grades this in about 4-5 scale of 10. She also described to have some swelling of lower extremities however she cannot tell me if it's worse in the morning and worsen in the afternoon. She took some diuretic for it with partial relief. This edema is partially pitting.  Past Medical History:  Diagnosis Date  . Allergy   . Arthritis    osteoarthritis, severe, b/l knees &b/l hips  replaced  . BPV (benign positional vertigo) 02/19/2013   mild  . Chicken pox as a child  . GERD (gastroesophageal  reflux disease) 05/27/2014  . H/O breast biopsy    benign, on left  . Hypertension   . Kidney stone 2000   nephritis as a child  . Measles as a child  . Mumps as a child  . Obesity   . Osteoporosis   . Preventative health care 11/25/2014  . Pruritus 01/21/2017  . Skin lesion of face 05/18/2012  . Thyroid disease   . Unsteady gait 11/25/2014  . UTI (urinary tract infection) 12/11/2012  . Viral respiratory illness 05/21/2013  . Vitamin D deficiency     Past Surgical History:  Procedure Laterality Date  . CATARACT EXTRACTION Left 2017  . HIP SURGERY     both hips  . KNEE SURGERY     both knees replaced    Current Medications: Current Meds  Medication Sig  . Ascorbic Acid (VITAMIN C) 1000 MG tablet Take 1,000 mg by mouth daily.  Marland Kitchen aspirin 81 MG tablet Take 81 mg by mouth daily.  . Cholecalciferol (VITAMIN D) 2000 UNITS tablet Take 2,000 Units by mouth daily.  . Coenzyme Q10 (CO Q 10) 100 MG CAPS Take 400 mg by mouth daily.  . fluconazole (DIFLUCAN) 100 MG tablet Take 1 tablet (100 mg total) by mouth once a week. For a total of 4 weeks.  . furosemide (LASIX) 20 MG tablet Take 1 tablet (20 mg total) by mouth daily as needed.  Marland Kitchen  hydrOXYzine (ATARAX/VISTARIL) 10 MG tablet Take 2 tablets by mouth 2 (two) times daily.  Marland Kitchen levothyroxine (SYNTHROID, LEVOTHROID) 125 MCG tablet Take 1 tablet (125 mcg total) by mouth daily.  Marland Kitchen lisinopril (PRINIVIL,ZESTRIL) 10 MG tablet Take 1 tablet (10 mg total) by mouth 2 (two) times daily.  Marland Kitchen loratadine (CLARITIN) 10 MG tablet Take 1 tablet (10 mg total) by mouth 2 (two) times daily as needed for allergies.  . nitroGLYCERIN (NITROSTAT) 0.4 MG SL tablet Place 1 tablet (0.4 mg total) under the tongue every 5 (five) minutes as needed for chest pain.  . Omega-3 Fatty Acids (FISH OIL) 1000 MG CPDR Take 1 capsule by mouth daily.  . Probiotic Product (PROBIOTIC DAILY PO) Take by mouth.  . triamcinolone cream (KENALOG) 0.1 % Apply 1 application topically 2 (two)  times daily.     Allergies:   Eggs or egg-derived products; Sulfa antibiotics; Keflex [cephalexin]; Penicillins; and Poison ivy extract [poison ivy extract]   Social History   Social History  . Marital status: Unknown    Spouse name: N/A  . Number of children: N/A  . Years of education: N/A   Social History Main Topics  . Smoking status: Never Smoker  . Smokeless tobacco: Never Used  . Alcohol use Yes     Comment: Occasional glass of wine  . Drug use: No  . Sexual activity: No   Other Topics Concern  . None   Social History Narrative  . None     Family History: The patient's  family history includes Cancer in her father and paternal grandmother; Heart attack in her father and mother; Hyperlipidemia in her mother; Hypertension in her mother and son; Thyroid disease in her brother. ROS:   Please see the history of present illness.    All 14 point review of systems negative except as described per history of present illness.  EKGs/Labs/Other Studies Reviewed:    The following studies were reviewed today: EKG done by primary care physician showed normal sinus rhythm, normal PR interval, normal to discomfort duration and morphology.    Recent Labs: 01/21/2017: ALT 14; BUN 15; Creatinine, Ser 0.76; Hemoglobin 14.3; Platelets 266.0; Potassium 3.9; Sodium 140; TSH 0.15  Recent Lipid Panel    Component Value Date/Time   CHOL 145 02/16/2013 1211   TRIG 117 02/16/2013 1211   HDL 46 02/16/2013 1211   CHOLHDL 3.2 02/16/2013 1211   VLDL 23 02/16/2013 1211   LDLCALC 76 02/16/2013 1211    Physical Exam:    VS:  BP 110/78   Pulse 72   Resp 10   Ht 5\' 2"  (1.575 m)   Wt 184 lb 1.9 oz (83.5 kg)   BMI 33.68 kg/m     Wt Readings from Last 3 Encounters:  04/12/17 184 lb 1.9 oz (83.5 kg)  04/01/17 185 lb (83.9 kg)  03/23/17 186 lb 9.6 oz (84.6 kg)     GEN:  Well nourished, well developed in no acute distress HEENT: Normal NECK: No JVD; No carotid bruits LYMPHATICS: No  lymphadenopathy CARDIAC: RRR, no murmurs, no rubs, no gallops RESPIRATORY:  Clear to auscultation without rales, wheezing or rhonchi  ABDOMEN: Soft, non-tender, non-distended MUSCULOSKELETAL:  Trace pitting edema; No deformity  SKIN: Warm and dry NEUROLOGIC:  Alert and oriented x 3 PSYCHIATRIC:  Normal affect   ASSESSMENT:    1. Essential hypertension   2. Arthritis   3. Pedal edema    PLAN:    In order of problems listed above:  1. Atypical chest pain. Threasa Beards does have some risk factors for coronary artery disease, I will ask her to have [place]. She is taking away the aspirin which I will continue. Several risk factors assessment I will ask her to have fasting lipid profile. She told me that she would like to wait with the stresses and she will let me know within a month when she would like to have it done. She said she needed to see dermatologist for his because of hives. I told her if the pain became more frequent or change characteristic she needs to let me know or if she decided to have a stress test she can simply call us and we will schedule her for a test. 2. Essential hypertension: Blood pressure appears to be well-controlled continue present management. 3. Pitting edema of lower extremities only mild. Waiting for echocardiogram to assess left ventricular ejection fraction the right ventricle pressure. She told me that she does not snore at night. However if I see some elevation of the pulmonary pressure we may be forced to do sleep study. 4. Arthritis: Diminished by internal medicine team. 5. Cholesterol status last cholesterol I see in the chart is from 2014 ask her to have fasting lipid profile done.   Medication Adjustments/Labs and Tests Ordered: Current medicines are reviewed at length with the patient today.  Concerns regarding medicines are outlined above.  No orders of the defined types were placed in this encounter.  No orders of the defined types were placed in this  encounter.   Signed, Park Liter, MD, [facility]. 04/12/2017 10:28 AM    [facility]

## 2017-04-12 NOTE — Patient Instructions (Signed)
Medication Instructions:  Your physician recommends that you continue on your current medications as directed. Please refer to the Current Medication list given to you today.  1. Avoid all over-the-counter antihistamines except Claritin/Loratadine and Zyrtec/Cetrizine. 2. Avoid all combination including cold sinus allergies flu decongestant and sleep medications 3. You can use Robitussin DM Mucinex and Mucinex DM for cough. 4. can use Tylenol aspirin ibuprofen and naproxen but no combinations such as sleep or sinus.  Labwork: Your physician recommends that you return for a FASTING lipid profile: when you visit your Primary Care Doctor you can have this obtained.   Testing/Procedures: Your physician has requested that you have an echocardiogram. Echocardiography is a painless test that uses sound waves to create images of your heart. It provides your doctor with information about the size and shape of your heart and how well your heart's chambers and valves are working. This procedure takes approximately one hour. There are no restrictions for this procedure.   Follow-Up: Your physician recommends that you schedule a follow-up appointment in: 2 months   Any Other Special Instructions Will Be Listed Below (If Applicable).  Please note that any paperwork needing to be filled out by the provider will need to be addressed at the front desk prior to seeing the provider. Please note that any paperwork FMLA, Disability or other documents regarding health condition is subject to a $25.00 charge that must be received prior to completion of paperwork in the form of a money order or check.    If you need a refill on your cardiac medications before your next appointment, please call your pharmacy.

## 2017-04-13 DIAGNOSIS — L299 Pruritus, unspecified: Secondary | ICD-10-CM | POA: Diagnosis not present

## 2017-04-13 DIAGNOSIS — L309 Dermatitis, unspecified: Secondary | ICD-10-CM | POA: Diagnosis not present

## 2017-04-26 ENCOUNTER — Encounter: Payer: Self-pay | Admitting: Family Medicine

## 2017-04-26 DIAGNOSIS — B86 Scabies: Secondary | ICD-10-CM | POA: Diagnosis not present

## 2017-04-27 ENCOUNTER — Encounter: Payer: Self-pay | Admitting: Family Medicine

## 2017-05-03 ENCOUNTER — Other Ambulatory Visit: Payer: Self-pay | Admitting: Family Medicine

## 2017-05-03 MED ORDER — HYDROXYZINE HCL 10 MG PO TABS: 10.0000 mg | ORAL_TABLET | Freq: Two times a day (BID) | ORAL | 2 refills | Status: DC

## 2017-05-05 ENCOUNTER — Other Ambulatory Visit (HOSPITAL_BASED_OUTPATIENT_CLINIC_OR_DEPARTMENT_OTHER): Payer: Medicare HMO

## 2017-05-19 DIAGNOSIS — E039 Hypothyroidism, unspecified: Secondary | ICD-10-CM | POA: Diagnosis not present

## 2017-05-24 DIAGNOSIS — B86 Scabies: Secondary | ICD-10-CM | POA: Diagnosis not present

## 2017-05-24 DIAGNOSIS — L299 Pruritus, unspecified: Secondary | ICD-10-CM | POA: Diagnosis not present

## 2017-05-25 ENCOUNTER — Other Ambulatory Visit: Payer: Self-pay

## 2017-05-25 ENCOUNTER — Encounter: Payer: Self-pay | Admitting: Family Medicine

## 2017-05-25 ENCOUNTER — Ambulatory Visit: Payer: Medicare HMO | Admitting: Family Medicine

## 2017-05-25 VITALS — BP 128/68 | HR 89 | Temp 98.2°F | Resp 18 | Ht 62.0 in | Wt 187.2 lb

## 2017-05-25 DIAGNOSIS — Z23 Encounter for immunization: Secondary | ICD-10-CM

## 2017-05-25 DIAGNOSIS — R739 Hyperglycemia, unspecified: Secondary | ICD-10-CM

## 2017-05-25 DIAGNOSIS — E2839 Other primary ovarian failure: Secondary | ICD-10-CM | POA: Diagnosis not present

## 2017-05-25 DIAGNOSIS — E559 Vitamin D deficiency, unspecified: Secondary | ICD-10-CM | POA: Diagnosis not present

## 2017-05-25 DIAGNOSIS — Z0000 Encounter for general adult medical examination without abnormal findings: Secondary | ICD-10-CM | POA: Diagnosis not present

## 2017-05-25 DIAGNOSIS — E079 Disorder of thyroid, unspecified: Secondary | ICD-10-CM

## 2017-05-25 DIAGNOSIS — L309 Dermatitis, unspecified: Secondary | ICD-10-CM

## 2017-05-25 DIAGNOSIS — I10 Essential (primary) hypertension: Secondary | ICD-10-CM

## 2017-05-25 DIAGNOSIS — E6609 Other obesity due to excess calories: Secondary | ICD-10-CM | POA: Diagnosis not present

## 2017-05-25 LAB — COMPREHENSIVE METABOLIC PANEL
ALBUMIN: 3.9 g/dL (ref 3.5–5.2)
ALT: 13 U/L (ref 0–35)
AST: 16 U/L (ref 0–37)
Alkaline Phosphatase: 60 U/L (ref 39–117)
BILIRUBIN TOTAL: 0.4 mg/dL (ref 0.2–1.2)
BUN: 15 mg/dL (ref 6–23)
CALCIUM: 9.3 mg/dL (ref 8.4–10.5)
CO2: 29 mEq/L (ref 19–32)
CREATININE: 0.82 mg/dL (ref 0.40–1.20)
Chloride: 104 mEq/L (ref 96–112)
GFR: 71.99 mL/min (ref >60.00)
Glucose, Bld: 92 mg/dL (ref 70–99)
Potassium: 3.9 mEq/L (ref 3.5–5.1)
Sodium: 140 mEq/L (ref 135–145)
TOTAL PROTEIN: 6.6 g/dL (ref 6.0–8.3)

## 2017-05-25 LAB — CBC
HCT: 43.9 % (ref 36.0–46.0)
HEMOGLOBIN: 14.6 g/dL (ref 12.0–15.0)
MCHC: 33.2 g/dL (ref 30.0–36.0)
MCV: 92.6 fl (ref 78.0–100.0)
PLATELETS: 332 10*3/uL (ref 150.0–400.0)
RBC: 4.74 Mil/uL (ref 3.87–5.11)
RDW: 13.8 % (ref 11.5–15.5)
WBC: 5.9 10*3/uL (ref 4.0–10.5)

## 2017-05-25 LAB — TSH: TSH: 0.72 u[IU]/mL (ref 0.35–4.50)

## 2017-05-25 LAB — HEMOGLOBIN A1C: Hgb A1c MFr Bld: 5.9 % (ref 4.6–6.5)

## 2017-05-25 LAB — VITAMIN D 25 HYDROXY (VIT D DEFICIENCY, FRACTURES): VITD: 35.93 ng/mL (ref 30.00–100.00)

## 2017-05-25 MED ORDER — LEVOTHYROXINE SODIUM 125 MCG PO TABS: 125.0000 ug | ORAL_TABLET | Freq: Every day | ORAL | 3 refills | Status: DC

## 2017-05-25 MED ORDER — MUPIROCIN 2 % EX OINT: 1.0000 "application " | TOPICAL_OINTMENT | Freq: Two times a day (BID) | CUTANEOUS | 0 refills | Status: DC

## 2017-05-25 NOTE — Assessment & Plan Note (Signed)
Encouraged DASH diet, decrease po intake and increase exercise as tolerated. Needs 7-8 hours of sleep nightly. Avoid trans fats, eat small, frequent meals every 4-5 hours with lean proteins, complex carbs and healthy fats. Minimize simple carbs 

## 2017-05-25 NOTE — Assessment & Plan Note (Signed)
Patient encouraged to maintain heart healthy diet, regular exercise, adequate sleep. Consider daily probiotics. Take medications as prescribed, encouraged to give [place] copy of ACP documents

## 2017-05-25 NOTE — Progress Notes (Signed)
Subjective:  I acted as a Education administrator for Dr. Charlett Blake. [name], [place]  Patient ID: Felicia Lowe, female    DOB: 15-Sep-1940, 76 y.o.   MRN: [id number]  Chief Complaint  Patient presents with  . Annual Exam    HPI  Patient is in today for an annual exam and follow up on chronic medical concerns. No fevers but has continued to have a pruritic maculopapular rash.  Dermatology has ultimately diagnosed her with scabies but she has been slow to respond.  She does believe it is improving slightly.  She also has started following with endocrinology and is seeing Dr. Hartford Poli at [facility].  They have checked her thyroid and chosen not to change her dosing of Unithroid at this time.  No recent febrile illness or recent hospitalization. Denies CP/palp/SOB/HA/congestion/fevers/GI or GU c/o. Taking meds as prescribed. Notes she is doing well with ADLs at home.   Patient Care Team: Mosie Lukes, MD as PCP - General (Family Medicine) Stephannie Li, [place] as Consulting Physician (Ophthalmology) Valinda Party, MD as Consulting Physician (Rheumatology) Harold Hedge, Darrick Grinder, MD as Consulting Physician (Allergy and Immunology)   Past Medical History:  Diagnosis Date  . Allergy   . Arthritis    osteoarthritis, severe, b/l knees &b/l hips  replaced  . BPV (benign positional vertigo) 02/19/2013   mild  . Chicken pox as a child  . GERD (gastroesophageal reflux disease) 05/27/2014  . H/O breast biopsy    benign, on left  . Hypertension   . Kidney stone 2000   nephritis as a child  . Measles as a child  . Mumps as a child  . Obesity   . Osteoporosis   . Preventative health care 11/25/2014  . Pruritus 01/21/2017  . Skin lesion of face 05/18/2012  . Thyroid disease   . Unsteady gait 11/25/2014  . UTI (urinary tract infection) 12/11/2012  . Viral respiratory illness 05/21/2013  . Vitamin D deficiency     Past Surgical History:  Procedure Laterality Date  . CATARACT EXTRACTION Left 2017  . HIP  SURGERY     both hips  . KNEE SURGERY     both knees replaced    Family History  Problem Relation Age of Onset  . Hyperlipidemia Mother   . Hypertension Mother   . Heart attack Mother   . Cancer Father        lung- smoker  . Heart attack Father        X 2  . Thyroid disease Brother   . Hypertension Son   . Cancer Paternal Grandmother     Social History   Socioeconomic History  . Marital status: Unknown    Spouse name: Not on file  . Number of children: Not on file  . Years of education: Not on file  . Highest education level: Not on file  Social Needs  . Financial resource strain: Not on file  . Food insecurity - worry: Not on file  . Food insecurity - inability: Not on file  . Transportation needs - medical: Not on file  . Transportation needs - non-medical: Not on file  Occupational History  . Not on file  Tobacco Use  . Smoking status: Never Smoker  . Smokeless tobacco: Never Used  Substance and Sexual Activity  . Alcohol use: Yes    Comment: Occasional glass of wine  . Drug use: No  . Sexual activity: No  Other Topics Concern  . Not on  file  Social History Narrative  . Not on file    Outpatient Medications Prior to Visit  Medication Sig Dispense Refill  . Ascorbic Acid (VITAMIN C) 1000 MG tablet Take 1,000 mg by mouth daily.    Marland Kitchen aspirin 81 MG tablet Take 81 mg by mouth daily.    . Cholecalciferol (VITAMIN D) 2000 UNITS tablet Take 2,000 Units by mouth daily.    . Coenzyme Q10 (CO Q 10) 100 MG CAPS Take 400 mg by mouth daily.    . fluconazole (DIFLUCAN) 100 MG tablet Take 1 tablet (100 mg total) by mouth once a week. For a total of 4 weeks. 4 tablet 0  . furosemide (LASIX) 20 MG tablet Take 1 tablet (20 mg total) by mouth daily as needed. 30 tablet 4  . hydrOXYzine (ATARAX/VISTARIL) 10 MG tablet Take 1-2 tablets (10-20 mg total) by mouth 2 (two) times daily. 60 tablet 2  . levothyroxine (SYNTHROID, LEVOTHROID) 125 MCG tablet Take 1 tablet (125 mcg total)  by mouth daily. 90 tablet 3  . lisinopril (PRINIVIL,ZESTRIL) 10 MG tablet Take 1 tablet (10 mg total) by mouth 2 (two) times daily. 90 tablet 3  . loratadine (CLARITIN) 10 MG tablet Take 1 tablet (10 mg total) by mouth 2 (two) times daily as needed for allergies. 30 tablet 11  . nitroGLYCERIN (NITROSTAT) 0.4 MG SL tablet Place 1 tablet (0.4 mg total) under the tongue every 5 (five) minutes as needed for chest pain. 50 tablet 3  . Omega-3 Fatty Acids (FISH OIL) 1000 MG CPDR Take 1 capsule by mouth daily.    . Probiotic Product (PROBIOTIC DAILY PO) Take by mouth.    . triamcinolone cream (KENALOG) 0.1 % Apply 1 application topically 2 (two) times daily. 453.6 g 3   No facility-administered medications prior to visit.     Allergies  Allergen Reactions  . Eggs Or Egg-Derived Products Shortness Of Breath and Rash  . Sulfa Antibiotics Nausea And Vomiting  . Keflex [Cephalexin] Rash    Rash   . Penicillins Rash  . Poison Ivy Extract [Poison Ivy Extract] Rash    Review of Systems  Constitutional: Negative for fever and malaise/fatigue.  HENT: Negative for congestion.   Eyes: Negative for blurred vision.  Respiratory: Negative for cough and shortness of breath.   Cardiovascular: Negative for chest pain, palpitations and leg swelling.  Gastrointestinal: Negative for vomiting.  Musculoskeletal: Negative for back pain.  Skin: Positive for itching and rash.  Neurological: Negative for loss of consciousness and headaches.       Objective:    Physical Exam  Constitutional: She is oriented to person, place, and time. She appears well-developed and well-nourished. No distress.  HENT:  Head: Normocephalic and atraumatic.  Eyes: Conjunctivae are normal.  Neck: Normal range of motion. No thyromegaly present.  Cardiovascular: Normal rate and regular rhythm.  Pulmonary/Chest: Effort normal and breath sounds normal. She has no wheezes.  Abdominal: Soft. Bowel sounds are normal. There is no  tenderness.  Musculoskeletal: Normal range of motion. She exhibits no edema or deformity.  Neurological: She is alert and oriented to person, place, and time.  Skin: Skin is warm and dry. Rash noted. She is not diaphoretic.  Scattered maculopapular lesions on trunk and extreiities. Some linear lesions noted along pant line.  Psychiatric: She has a normal mood and affect.    BP 128/68 (BP Location: Left Arm, Patient Position: Sitting, Cuff Size: Normal)   Pulse 89   Temp 98.2 F (36.8  C) (Oral)   Resp 18   Ht 5\' 2"  (1.575 m)   Wt 187 lb 3.2 oz (84.9 kg)   SpO2 98%   BMI 34.24 kg/m  Wt Readings from Last 3 Encounters:  05/25/17 187 lb 3.2 oz (84.9 kg)  04/12/17 184 lb 1.9 oz (83.5 kg)  04/01/17 185 lb (83.9 kg)   BP Readings from Last 3 Encounters:  05/25/17 128/68  04/12/17 110/78  04/01/17 135/79     Immunization History  Administered Date(s) Administered  . Influenza, Quadrivalent, Recombinant, Inj, Pf 05/15/2016, 05/25/2017  . Influenza,trivalent, recombinat, inj, PF 05/17/2014, 05/07/2015  . Pneumococcal Conjugate-13 03/06/2016  . Pneumococcal Polysaccharide-23 07/06/2010  . Td 03/06/2016  . Tdap 07/06/2001  . Zoster 04/06/2011    Health Maintenance  Topic Date Due  . DEXA SCAN  02/13/2006  . INFLUENZA VACCINE  06/01/2017 (Originally 02/03/2017)  . TETANUS/TDAP  03/06/2026  . PNA vac Low Risk Adult  Completed    Lab Results  Component Value Date   WBC 5.4 01/21/2017   HGB 14.3 01/21/2017   HCT 43.4 01/21/2017   PLT 266.0 01/21/2017   GLUCOSE 103 (H) 01/21/2017   CHOL 145 02/16/2013   TRIG 117 02/16/2013   HDL 46 02/16/2013   LDLCALC 76 02/16/2013   ALT 14 01/21/2017   AST 13 01/21/2017   NA 140 01/21/2017   K 3.9 01/21/2017   CL 106 01/21/2017   CREATININE 0.76 01/21/2017   BUN 15 01/21/2017   CO2 26 01/21/2017   TSH 0.15 (L) 01/21/2017    Lab Results  Component Value Date   TSH 0.15 (L) 01/21/2017   Lab Results  Component Value Date   WBC  5.4 01/21/2017   HGB 14.3 01/21/2017   HCT 43.4 01/21/2017   MCV 90.8 01/21/2017   PLT 266.0 01/21/2017   Lab Results  Component Value Date   NA 140 01/21/2017   K 3.9 01/21/2017   CO2 26 01/21/2017   GLUCOSE 103 (H) 01/21/2017   BUN 15 01/21/2017   CREATININE 0.76 01/21/2017   BILITOT 0.4 01/21/2017   ALKPHOS 57 01/21/2017   AST 13 01/21/2017   ALT 14 01/21/2017   PROT 6.8 01/21/2017   ALBUMIN 4.0 01/21/2017   CALCIUM 9.6 01/21/2017   GFR 78.65 01/21/2017   Lab Results  Component Value Date   CHOL 145 02/16/2013   Lab Results  Component Value Date   HDL 46 02/16/2013   Lab Results  Component Value Date   LDLCALC 76 02/16/2013   Lab Results  Component Value Date   TRIG 117 02/16/2013   Lab Results  Component Value Date   CHOLHDL 3.2 02/16/2013   No results found for: HGBA1C       Assessment & Plan:   Problem List Items Addressed This Visit    Thyroid disease    Follows with Dr Hartford Poli endocrinology, TSH good on Unithyroid       Relevant Orders   TSH   Essential hypertension    Well controlled, no changes to meds. Encouraged heart healthy diet such as the DASH diet and exercise as tolerated.       Relevant Orders   CBC   Comprehensive metabolic panel   Vitamin D deficiency    Check level today, taking a 1000 IU daily      Relevant Orders   VITAMIN D 25 Hydroxy (Vit-D Deficiency, Fractures)   Obesity    Encouraged DASH diet, decrease po intake and increase exercise as tolerated.  Needs 7-8 hours of sleep nightly. Avoid trans fats, eat small, frequent meals every 4-5 hours with lean proteins, complex carbs and healthy fats. Minimize simple carbs      Dermatitis    Ultimately diagnosed with scabies. Slowly responding to treatment. If lesions not fully resolved in 2 weeks dermatology wants to use Ivermectin       Preventative health care    Patient encouraged to maintain heart healthy diet, regular exercise, adequate sleep. Consider daily  probiotics. Take medications as prescribed, encouraged to give [place] copy of ACP documents      Hyperglycemia    hgba1c acceptable, minimize simple carbs. Increase exercise as tolerated.       Relevant Orders   Hemoglobin A1c    Other Visit Diagnoses    Needs flu shot    -  Primary   Relevant Orders   Flu vaccine, recombinant, quadrivalent, inj (Flublok quad egg free) (Completed)   Estrogen deficiency       Relevant Orders   DG Bone Density      I am having [address] "Manuela Schwartz" start on mupirocin ointment. I am also having her maintain her vitamin C, Vitamin D, Co Q 10, Fish Oil, aspirin, Probiotic Product (PROBIOTIC DAILY PO), furosemide, triamcinolone cream, fluconazole, lisinopril, nitroGLYCERIN, loratadine, levothyroxine, and hydrOXYzine.  Meds ordered this encounter  Medications  . mupirocin ointment (BACTROBAN) 2 %    Sig: Place 1 application 2 (two) times daily into the nose.    Dispense:  22 g    Refill:  0    CMA served as scribe during this visit. History, Physical and Plan performed by medical provider. Documentation and orders reviewed and attested to.  Penni Homans, MD

## 2017-05-25 NOTE — Assessment & Plan Note (Signed)
hgba1c acceptable, minimize simple carbs. Increase exercise as tolerated.  

## 2017-05-25 NOTE — Assessment & Plan Note (Signed)
Well controlled, no changes to meds. Encouraged heart healthy diet such as the DASH diet and exercise as tolerated.  °

## 2017-05-25 NOTE — Assessment & Plan Note (Addendum)
Check level today, taking a 1000 IU daily

## 2017-05-25 NOTE — Patient Instructions (Addendum)
[employer] Preventive Care 76 Years and Older, Female Preventive care refers to lifestyle choices and visits with your health care provider that can promote health and wellness. What does preventive care include?  A yearly physical exam. This is also called an annual well check.  Dental exams once or twice a year.  Routine eye exams. Ask your health care provider how often you should have your eyes checked.  Personal lifestyle choices, including: ? Daily care of your teeth and gums. ? Regular physical activity. ? Eating a healthy diet. ? Avoiding tobacco and drug use. ? Limiting alcohol use. ? Practicing safe sex. ? Taking low-dose aspirin every day. ? Taking vitamin and mineral supplements as recommended by your health care provider. What happens during an annual well check? The services and screenings done by your health care provider during your annual well check will depend on your age, overall health, lifestyle risk factors, and family history of disease. Counseling Your health care provider may ask you questions about your:  Alcohol use.  Tobacco use.  Drug use.  Emotional well-being.  Home and relationship well-being.  Sexual activity.  Eating habits.  History of falls.  Memory and ability to understand (cognition).  Work and work Statistician.  Reproductive health.  Screening You may have the following tests or measurements:  Height, weight, and BMI.  Blood pressure.  Lipid and cholesterol levels. These may be checked every 5 years, or more frequently if you are over 50 years old.  Skin check.  Lung cancer screening. You may have this screening every year starting at age 64 if you have a 30-pack-year history of smoking and currently smoke or have quit within the past 15 years.  Fecal occult blood test (FOBT) of the stool. You may have this test every year starting at age 30.  Flexible sigmoidoscopy or colonoscopy. You may have a sigmoidoscopy  every 5 years or a colonoscopy every 10 years starting at age 29.  Hepatitis C blood test.  Hepatitis B blood test.  Sexually transmitted disease (STD) testing.  Diabetes screening. This is done by checking your blood sugar (glucose) after you have not eaten for a while (fasting). You may have this done every 1-3 years.  Bone density scan. This is done to screen for osteoporosis. You may have this done starting at age 76.  Mammogram. This may be done every 1-2 years. Talk to your health care provider about how often you should have regular mammograms.  Talk with your health care provider about your test results, treatment options, and if necessary, the need for more tests. Vaccines Your health care provider may recommend certain vaccines, such as:  Influenza vaccine. This is recommended every year.  Tetanus, diphtheria, and acellular pertussis (Tdap, Td) vaccine. You may need a Td booster every 10 years.  Varicella vaccine. You may need this if you have not been vaccinated.  Zoster vaccine. You may need this after age 56.  Measles, mumps, and rubella (MMR) vaccine. You may need at least one dose of MMR if you were born in 1957 or later. You may also need a second dose.  Pneumococcal 13-valent conjugate (PCV13) vaccine. One dose is recommended after age 77.  Pneumococcal polysaccharide (PPSV23) vaccine. One dose is recommended after age 87.  Meningococcal vaccine. You may need this if you have certain conditions.  Hepatitis A vaccine. You may need this if you have certain conditions or if you travel or work in places where you may be exposed  to hepatitis A.  Hepatitis B vaccine. You may need this if you have certain conditions or if you travel or work in places where you may be exposed to hepatitis B.  Haemophilus influenzae type b (Hib) vaccine. You may need this if you have certain conditions.  Talk to your health care provider about which screenings and vaccines you need and  how often you need them. This information is not intended to replace advice given to you by your health care provider. Make sure you discuss any questions you have with your health care provider. Document Released: 07/19/2015 Document Revised: 03/11/2016 Document Reviewed: 04/23/2015 Elsevier Interactive Patient Education  2017 [employer].

## 2017-05-25 NOTE — Assessment & Plan Note (Signed)
Follows with Dr Hartford Poli endocrinology, TSH good on Unithyroid

## 2017-05-25 NOTE — Assessment & Plan Note (Signed)
Ultimately diagnosed with scabies. Slowly responding to treatment. If lesions not fully resolved in 2 weeks dermatology wants to use Ivermectin

## 2017-05-27 ENCOUNTER — Other Ambulatory Visit: Payer: Self-pay | Admitting: Family Medicine

## 2017-06-14 ENCOUNTER — Ambulatory Visit: Payer: Medicare HMO | Admitting: Cardiology

## 2017-06-19 ENCOUNTER — Other Ambulatory Visit: Payer: Self-pay | Admitting: Family Medicine

## 2017-06-19 DIAGNOSIS — E079 Disorder of thyroid, unspecified: Secondary | ICD-10-CM

## 2017-06-22 DIAGNOSIS — E039 Hypothyroidism, unspecified: Secondary | ICD-10-CM | POA: Diagnosis not present

## 2017-07-02 DIAGNOSIS — I10 Essential (primary) hypertension: Secondary | ICD-10-CM | POA: Diagnosis not present

## 2017-07-02 DIAGNOSIS — E039 Hypothyroidism, unspecified: Secondary | ICD-10-CM | POA: Diagnosis not present

## 2017-07-06 HISTORY — PX: EYE SURGERY: SHX253

## 2017-07-13 ENCOUNTER — Other Ambulatory Visit: Payer: Self-pay | Admitting: Family Medicine

## 2017-07-14 DIAGNOSIS — Z96652 Presence of left artificial knee joint: Secondary | ICD-10-CM | POA: Diagnosis not present

## 2017-07-14 DIAGNOSIS — G8929 Other chronic pain: Secondary | ICD-10-CM | POA: Diagnosis not present

## 2017-07-14 DIAGNOSIS — M25562 Pain in left knee: Secondary | ICD-10-CM | POA: Diagnosis not present

## 2017-09-07 ENCOUNTER — Other Ambulatory Visit: Payer: Self-pay | Admitting: Family Medicine

## 2017-09-07 ENCOUNTER — Encounter: Payer: Self-pay | Admitting: Family Medicine

## 2017-09-07 MED ORDER — LEVOTHYROXINE SODIUM 125 MCG PO TABS: 125.0000 ug | ORAL_TABLET | Freq: Every day | ORAL | 3 refills | Status: DC

## 2017-09-24 ENCOUNTER — Other Ambulatory Visit: Payer: Self-pay | Admitting: Family Medicine

## 2017-09-24 ENCOUNTER — Encounter: Payer: Self-pay | Admitting: Family Medicine

## 2017-09-24 MED ORDER — DOXYCYCLINE HYCLATE 100 MG PO TABS: 100.0000 mg | ORAL_TABLET | Freq: Two times a day (BID) | ORAL | 0 refills | Status: DC

## 2017-10-15 NOTE — Progress Notes (Signed)
Subjective:   Felicia Lowe is a 77 y.o. female who presents for Medicare Annual (Subsequent) preventive examination.  Review of Systems: No ROS.  Medicare Wellness Visit. Additional risk factors are reflected in the social history.  Cardiac Risk Factors include: advanced age (>65men, >91 women);hypertension Sleep patterns:  Sleep varies. Home Safety/Smoke Alarms: Feels safe in home. Smoke alarms in place.  Living environment; residence and Firearm Safety: Lives with husband in 1 story home.   Female:      Mammo- declines       Dexa scan- scheduled 11/08/17       CCS- Cologuard 10/30/16-negative  Objective:     Vitals: BP 138/70 (BP Location: Left Arm, Patient Position: Sitting, Cuff Size: Normal)   Pulse 75   Ht 5\' 2"  (1.575 m)   Wt 185 lb 6.4 oz (84.1 kg)   SpO2 97%   BMI 33.91 kg/m   Body mass index is 33.91 kg/m.  Advanced Directives 10/20/2017 10/19/2016 11/15/2014  Does Patient Have a Medical Advance Directive? No Yes No  Does patient want to make changes to medical advance directive? Yes (MAU/Ambulatory/Procedural Areas - Information given) Yes (MAU/Ambulatory/Procedural Areas - Information given) -  Would patient like information on creating a medical advance directive? - - Yes - Educational materials given    Tobacco Social History   Tobacco Use  Smoking Status Never Smoker  Smokeless Tobacco Never Used     Counseling given: Not Answered   Clinical Intake: Pain : No/denies pain  Past Medical History:  Diagnosis Date  . Allergy   . Arthritis    osteoarthritis, severe, b/l knees &b/l hips  replaced  . BPV (benign positional vertigo) 02/19/2013   mild  . Chicken pox as a child  . GERD (gastroesophageal reflux disease) 05/27/2014  . H/O breast biopsy    benign, on left  . Hypertension   . Kidney stone 2000   nephritis as a child  . Measles as a child  . Mumps as a child  . Obesity   . Osteoporosis   . Preventative health care 11/25/2014  . Pruritus  01/21/2017  . Scabies   . Skin lesion of face 05/18/2012  . Thyroid disease   . Unsteady gait 11/25/2014  . UTI (urinary tract infection) 12/11/2012  . Viral respiratory illness 05/21/2013  . Vitamin D deficiency    Past Surgical History:  Procedure Laterality Date  . CATARACT EXTRACTION Left 2017  . HIP SURGERY     both hips  . KNEE SURGERY     both knees replaced   Family History  Problem Relation Age of Onset  . Hyperlipidemia Mother   . Hypertension Mother   . Heart attack Mother   . Cancer Father        lung- smoker  . Heart attack Father        X 2  . Thyroid disease Brother   . Hypertension Son   . Cancer Paternal Grandmother    Social History   Socioeconomic History  . Marital status: Unknown    Spouse name: Not on file  . Number of children: Not on file  . Years of education: Not on file  . Highest education level: Not on file  Occupational History  . Not on file  Social Needs  . Financial resource strain: Not on file  . Food insecurity:    Worry: Not on file    Inability: Not on file  . Transportation needs:    Medical:  Not on file    Non-medical: Not on file  Tobacco Use  . Smoking status: Never Smoker  . Smokeless tobacco: Never Used  Substance and Sexual Activity  . Alcohol use: Yes    Comment: Occasional glass of wine  . Drug use: No  . Sexual activity: Never  Lifestyle  . Physical activity:    Days per week: Not on file    Minutes per session: Not on file  . Stress: Not on file  Relationships  . Social connections:    Talks on phone: Not on file    Gets together: Not on file    Attends religious service: Not on file    Active member of club or organization: Not on file    Attends meetings of clubs or organizations: Not on file    Relationship status: Not on file  Other Topics Concern  . Not on file  Social History Narrative  . Not on file    Outpatient Encounter Medications as of 10/20/2017  Medication Sig  . Ascorbic Acid  (VITAMIN C) 1000 MG tablet Take 1,000 mg by mouth daily.  Marland Kitchen aspirin 81 MG tablet Take 81 mg by mouth daily.  . Cholecalciferol (VITAMIN D) 2000 UNITS tablet Take 2,000 Units by mouth daily.  . Coenzyme Q10 (CO Q 10) 100 MG CAPS Take 400 mg by mouth daily.  . furosemide (LASIX) 20 MG tablet Take 1 tablet (20 mg total) by mouth daily as needed.  . hydrOXYzine (ATARAX/VISTARIL) 10 MG tablet Take 1-2 tablets (10-20 mg total) by mouth 2 (two) times daily.  Marland Kitchen levothyroxine (SYNTHROID, LEVOTHROID) 125 MCG tablet Take 1 tablet (125 mcg total) by mouth daily. Synthroid  . lisinopril (PRINIVIL,ZESTRIL) 10 MG tablet Take 1 tablet (10 mg total) by mouth 2 (two) times daily.  Marland Kitchen loratadine (CLARITIN) 10 MG tablet Take 1 tablet (10 mg total) by mouth 2 (two) times daily as needed for allergies.  . nitroGLYCERIN (NITROSTAT) 0.4 MG SL tablet Place 1 tablet (0.4 mg total) under the tongue every 5 (five) minutes as needed for chest pain.  . Omega-3 Fatty Acids (FISH OIL) 1000 MG CPDR Take 1 capsule by mouth daily.  . Probiotic Product (PROBIOTIC DAILY PO) Take by mouth.  Marland Kitchen lisinopril-hydrochlorothiazide (PRINZIDE,ZESTORETIC) 10-12.5 MG tablet TAKE 1 TABLET BY MOUTH EVERY DAY (Patient not taking: Reported on 10/20/2017)  . mupirocin ointment (BACTROBAN) 2 % Place 1 application 2 (two) times daily into the nose. (Patient not taking: Reported on 10/20/2017)  . triamcinolone cream (KENALOG) 0.1 % Apply 1 application topically 2 (two) times daily. (Patient not taking: Reported on 10/20/2017)  . [DISCONTINUED] doxycycline (VIBRA-TABS) 100 MG tablet Take 1 tablet (100 mg total) by mouth 2 (two) times daily.  . [DISCONTINUED] fluconazole (DIFLUCAN) 100 MG tablet Take 1 tablet (100 mg total) by mouth once a week. For a total of 4 weeks.  . [DISCONTINUED] SYNTHROID 150 MCG tablet TAKE 1 TABLET BY MOUTH DAILY X6 DAYS   No facility-administered encounter medications on file as of 10/20/2017.     Activities of Daily Living In  your present state of health, do you have any difficulty performing the following activities: 10/20/2017  Hearing? N  Vision? N  Comment wearing glasses. hx cataract sx left eye. Dr.Coop last 1.5 yrs ago.  Difficulty concentrating or making decisions? N  Walking or climbing stairs? Y  Dressing or bathing? N  Doing errands, shopping? N  Preparing Food and eating ? N  Using the Toilet? N  In the past six months, have you accidently leaked urine? N  Do you have problems with loss of bowel control? N  Managing your Medications? N  Managing your Finances? N  Housekeeping or managing your Housekeeping? N  Some recent data might be hidden    Patient Care Team: Mosie Lukes, MD as PCP - General (Family Medicine) Stephannie Li, [place] as Consulting Physician (Ophthalmology) Valinda Party, MD as Consulting Physician (Rheumatology) Harold Hedge, Darrick Grinder, MD as Consulting Physician (Allergy and Immunology)    Assessment:   This is a routine wellness examination for Felicia Lowe. Physical assessment deferred to PCP.   Exercise Activities and Dietary recommendations Current Exercise Habits: Home exercise routine, Type of exercise: stretching;yoga, Time (Minutes): 30, Frequency (Times/Week): 2, Weekly Exercise (Minutes/Week): 60, Intensity: Mild   Diet (meal preparation, eat out, water intake, caffeinated beverages, dairy products, fruits and vegetables): in general, a "healthy" diet  , well balanced, on average, 3 meals per day      Goals    . Weight (lb) < 175 lb (79.4 kg) (pt-stated)     By continuing to eat healthy and stay active.    . Weight (lb) < 190 lb (86.2 kg)       Fall Risk Fall Risk  10/20/2017 10/19/2016 03/06/2016 11/15/2014 11/10/2013  Falls in the past year? Yes No No No No  Number falls in past yr: 2 or more - - - -  Injury with Fall? No - - - -  Follow up Education provided;Falls prevention discussed - - - -    Depression Screen PHQ 2/9 Scores 10/20/2017 10/19/2016 03/06/2016  11/15/2014  PHQ - 2 Score 0 0 0 0     Cognitive Function MMSE - Mini Mental State Exam 10/19/2016  Orientation to time 5  Orientation to Place 5  Registration 3  Attention/ Calculation 5  Recall 3  Language- name 2 objects 2  Language- repeat 1  Language- follow 3 step command 3  Language- read & follow direction 1  Write a sentence 1  Copy design 0  Total score 29        Immunization History  Administered Date(s) Administered  . Influenza, Quadrivalent, Recombinant, Inj, Pf 05/15/2016, 05/25/2017  . Influenza,trivalent, recombinat, inj, PF 05/17/2014, 05/07/2015  . Pneumococcal Conjugate-13 03/06/2016  . Pneumococcal Polysaccharide-23 07/06/2010  . Td 03/06/2016  . Tdap 07/06/2001  . Zoster 04/06/2011    Screening Tests Health Maintenance  Topic Date Due  . DEXA SCAN  02/13/2006  . INFLUENZA VACCINE  02/03/2018  . TETANUS/TDAP  03/06/2026  . PNA vac Low Risk Adult  Completed        Plan:   Please schedule appointment to follow up with Dr.Blyth next month to discuss concerns about cardio test and physical therapy options.  Schedule appointment for next AWV with me in 1 yr.  Consider trying a teaspoon of mustard or vinegar for leg cramps.  Schedule eye appointment.   I have personally reviewed and noted the following in the patient's chart:   . Medical and social history . Use of alcohol, tobacco or illicit drugs  . Current medications and supplements . Functional ability and status . Nutritional status . Physical activity . Advanced directives . List of other physicians . Hospitalizations, surgeries, and ER visits in previous 12 months . Vitals . Screenings to include cognitive, depression, and falls . Referrals and appointments  In addition, I have reviewed and discussed with patient certain preventive protocols, quality metrics, and  best practice recommendations. A written personalized care plan for preventive services as well as general preventive  health recommendations were provided to patient.     Naaman Plummer [place], [place]  10/20/2017

## 2017-10-20 ENCOUNTER — Ambulatory Visit: Payer: Medicare HMO | Admitting: *Deleted

## 2017-10-20 ENCOUNTER — Ambulatory Visit (INDEPENDENT_AMBULATORY_CARE_PROVIDER_SITE_OTHER): Payer: Medicare HMO | Admitting: *Deleted

## 2017-10-20 ENCOUNTER — Encounter: Payer: Self-pay | Admitting: *Deleted

## 2017-10-20 VITALS — BP 138/70 | HR 75 | Ht 62.0 in | Wt 185.4 lb

## 2017-10-20 DIAGNOSIS — Z0000 Encounter for general adult medical examination without abnormal findings: Secondary | ICD-10-CM | POA: Diagnosis not present

## 2017-10-20 NOTE — Progress Notes (Signed)
Medical screening examination/treatment was performed by qualified clinical staff member and as supervising physician I was immediately available for consultation/collaboration. I have reviewed documentation and agree with assessment and plan.  Stacey Blyth, MD 

## 2017-10-20 NOTE — Patient Instructions (Signed)
Please schedule appointment to follow up with Dr.Blyth next month to discuss concerns about cardio test and physical therapy options.  Schedule appointment for next AWV with me in 1 yr.  Consider trying a teaspoon of mustard or vinegar for leg cramps.  Schedule eye appointment.   Felicia Lowe , Thank you for taking time to come for your Medicare Wellness Visit. I appreciate your ongoing commitment to your health goals. Please review the following plan we discussed and let me know if I can assist you in the future.   These are the goals we discussed: Goals    . Weight (lb) < 175 lb (79.4 kg) (pt-stated)     By continuing to eat healthy and stay active.    . Weight (lb) < 190 lb (86.2 kg)       This is a list of the screening recommended for you and due dates:  Health Maintenance  Topic Date Due  . DEXA scan (bone density measurement)  02/13/2006  . Flu Shot  02/03/2018  . Tetanus Vaccine  03/06/2026  . Pneumonia vaccines  Completed    Health Maintenance for Postmenopausal Women Menopause is a normal process in which your reproductive ability comes to an end. This process happens gradually over a span of months to years, usually between the ages of 74 and 37. Menopause is complete when you have missed 12 consecutive menstrual periods. It is important to talk with your health care provider about some of the most common conditions that affect postmenopausal women, such as heart disease, cancer, and bone loss (osteoporosis). Adopting a healthy lifestyle and getting preventive care can help to promote your health and wellness. Those actions can also lower your chances of developing some of these common conditions. What should I know about menopause? During menopause, you may experience a number of symptoms, such as:  Moderate-to-severe hot flashes.  Night sweats.  Decrease in sex drive.  Mood swings.  Headaches.  Tiredness.  Irritability.  Memory  problems.  Insomnia.  Choosing to treat or not to treat menopausal changes is an individual decision that you make with your health care provider. What should I know about hormone replacement therapy and supplements? Hormone therapy products are effective for treating symptoms that are associated with menopause, such as hot flashes and night sweats. Hormone replacement carries certain risks, especially as you become older. If you are thinking about using estrogen or estrogen with progestin treatments, discuss the benefits and risks with your health care provider. What should I know about heart disease and stroke? Heart disease, heart attack, and stroke become more likely as you age. This may be due, in part, to the hormonal changes that your body experiences during menopause. These can affect how your body processes dietary fats, triglycerides, and cholesterol. Heart attack and stroke are both medical emergencies. There are many things that you can do to help prevent heart disease and stroke:  Have your blood pressure checked at least every 1-2 years. High blood pressure causes heart disease and increases the risk of stroke.  If you are 83-34 years old, ask your health care provider if you should take aspirin to prevent a heart attack or a stroke.  Do not use any tobacco products, including cigarettes, chewing tobacco, or electronic cigarettes. If you need help quitting, ask your health care provider.  It is important to eat a healthy diet and maintain a healthy weight. ? Be sure to include plenty of vegetables, fruits, low-fat dairy products, and  lean protein. ? Avoid eating foods that are high in solid fats, added sugars, or salt (sodium).  Get regular exercise. This is one of the most important things that you can do for your health. ? Try to exercise for at least 150 minutes each week. The type of exercise that you do should increase your heart rate and make you sweat. This is known as  moderate-intensity exercise. ? Try to do strengthening exercises at least twice each week. Do these in addition to the moderate-intensity exercise.  Know your numbers.Ask your health care provider to check your cholesterol and your blood glucose. Continue to have your blood tested as directed by your health care provider.  What should I know about cancer screening? There are several types of cancer. Take the following steps to reduce your risk and to catch any cancer development as early as possible. Breast Cancer  Practice breast self-awareness. ? This means understanding how your breasts normally appear and feel. ? It also means doing regular breast self-exams. Let your health care provider know about any changes, no matter how small.  If you are 3 or older, have a clinician do a breast exam (clinical breast exam or CBE) every year. Depending on your age, family history, and medical history, it may be recommended that you also have a yearly breast X-ray (mammogram).  If you have a family history of breast cancer, talk with your health care provider about genetic screening.  If you are at high risk for breast cancer, talk with your health care provider about having an MRI and a mammogram every year.  Breast cancer (BRCA) gene test is recommended for women who have family members with BRCA-related cancers. Results of the assessment will determine the need for genetic counseling and BRCA1 and for BRCA2 testing. BRCA-related cancers include these types: ? Breast. This occurs in males or females. ? Ovarian. ? Tubal. This may also be called fallopian tube cancer. ? Cancer of the abdominal or pelvic lining (peritoneal cancer). ? Prostate. ? Pancreatic.  Cervical, Uterine, and Ovarian Cancer Your health care provider may recommend that you be screened regularly for cancer of the pelvic organs. These include your ovaries, uterus, and vagina. This screening involves a pelvic exam, which  includes checking for microscopic changes to the surface of your cervix (Pap test).  For women ages 21-65, health care providers may recommend a pelvic exam and a Pap test every three years. For women ages 58-65, they may recommend the Pap test and pelvic exam, combined with testing for human papilloma virus (HPV), every five years. Some types of HPV increase your risk of cervical cancer. Testing for HPV may also be done on women of any age who have unclear Pap test results.  Other health care providers may not recommend any screening for nonpregnant women who are considered low risk for pelvic cancer and have no symptoms. Ask your health care provider if a screening pelvic exam is right for you.  If you have had past treatment for cervical cancer or a condition that could lead to cancer, you need Pap tests and screening for cancer for at least 20 years after your treatment. If Pap tests have been discontinued for you, your risk factors (such as having a new sexual partner) need to be reassessed to determine if you should start having screenings again. Some women have medical problems that increase the chance of getting cervical cancer. In these cases, your health care provider may recommend that you have  screening and Pap tests more often.  If you have a family history of uterine cancer or ovarian cancer, talk with your health care provider about genetic screening.  If you have vaginal bleeding after reaching menopause, tell your health care provider.  There are currently no reliable tests available to screen for ovarian cancer.  Lung Cancer Lung cancer screening is recommended for adults 27-52 years old who are at high risk for lung cancer because of a history of smoking. A yearly low-dose CT scan of the lungs is recommended if you:  Currently smoke.  Have a history of at least 30 pack-years of smoking and you currently smoke or have quit within the past 15 years. A pack-year is smoking an  average of one pack of cigarettes per day for one year.  Yearly screening should:  Continue until it has been 15 years since you quit.  Stop if you develop a health problem that would prevent you from having lung cancer treatment.  Colorectal Cancer  This type of cancer can be detected and can often be prevented.  Routine colorectal cancer screening usually begins at age 83 and continues through age 83.  If you have risk factors for colon cancer, your health care provider may recommend that you be screened at an earlier age.  If you have a family history of colorectal cancer, talk with your health care provider about genetic screening.  Your health care provider may also recommend using home test kits to check for hidden blood in your stool.  A small camera at the end of a tube can be used to examine your colon directly (sigmoidoscopy or colonoscopy). This is done to check for the earliest forms of colorectal cancer.  Direct examination of the colon should be repeated every 5-10 years until age 50. However, if early forms of precancerous polyps or small growths are found or if you have a family history or genetic risk for colorectal cancer, you may need to be screened more often.  Skin Cancer  Check your skin from head to toe regularly.  Monitor any moles. Be sure to tell your health care provider: ? About any new moles or changes in moles, especially if there is a change in a mole's shape or color. ? If you have a mole that is larger than the size of a pencil eraser.  If any of your family members has a history of skin cancer, especially at a young age, talk with your health care provider about genetic screening.  Always use sunscreen. Apply sunscreen liberally and repeatedly throughout the day.  Whenever you are outside, protect yourself by wearing long sleeves, pants, a wide-brimmed hat, and sunglasses.  What should I know about osteoporosis? Osteoporosis is a condition in  which bone destruction happens more quickly than new bone creation. After menopause, you may be at an increased risk for osteoporosis. To help prevent osteoporosis or the bone fractures that can happen because of osteoporosis, the following is recommended:  If you are 61-56 years old, get at least 1,000 mg of calcium and at least 600 mg of vitamin D per day.  If you are older than age 19 but younger than age 41, get at least 1,200 mg of calcium and at least 600 mg of vitamin D per day.  If you are older than age 52, get at least 1,200 mg of calcium and at least 800 mg of vitamin D per day.  Smoking and excessive alcohol intake increase the risk  of osteoporosis. Eat foods that are rich in calcium and vitamin D, and do weight-bearing exercises several times each week as directed by your health care provider. What should I know about how menopause affects my mental health? Depression may occur at any age, but it is more common as you become older. Common symptoms of depression include:  Low or sad mood.  Changes in sleep patterns.  Changes in appetite or eating patterns.  Feeling an overall lack of motivation or enjoyment of activities that you previously enjoyed.  Frequent crying spells.  Talk with your health care provider if you think that you are experiencing depression. What should I know about immunizations? It is important that you get and maintain your immunizations. These include:  Tetanus, diphtheria, and pertussis (Tdap) booster vaccine.  Influenza every year before the flu season begins.  Pneumonia vaccine.  Shingles vaccine.  Your health care provider may also recommend other immunizations. This information is not intended to replace advice given to you by your health care provider. Make sure you discuss any questions you have with your health care provider. Document Released: 08/14/2005 Document Revised: 01/10/2016 Document Reviewed: 03/26/2015 Elsevier Interactive  Patient Education  2018 [employer].

## 2017-10-28 ENCOUNTER — Telehealth: Payer: Self-pay | Admitting: Family Medicine

## 2017-10-28 NOTE — Telephone Encounter (Signed)
Copied from [place] [id number]. Topic: Quick Communication - Rx Refill/Question >> Oct 28, 2017  2:57 PM Margot Ables wrote: Medication: lisinopril & levothyroxine - pt husband got ill unexpectedly when traveling. They are in [place] and she is needing more medication than she took with her. Maybe send in 1 weeks worth of medications? Her husband is expected to be in the hospital for a few more days.  Preferred Pharmacy (with phone number or street name): [facility] - This is the pharmacy inside the hospital and only open Monday-Friday - please send Friday and call her to notify on her cell ph# [facility], [place], [place] - [address] [place] [phone] (Phone) [phone] (Fax)

## 2017-10-29 NOTE — Telephone Encounter (Signed)
Please route this to Debbrah Alar - Dr. Charlett Blake is out and patient is needing a few pills to last until she gets back to [place].   She is changing pharmacy due to hours of operation Please send to :   [facility].  Jerline Pain, [place]

## 2017-11-01 NOTE — Telephone Encounter (Signed)
Patient should be back in town at this point. Will call and make sure

## 2017-11-08 ENCOUNTER — Ambulatory Visit (HOSPITAL_BASED_OUTPATIENT_CLINIC_OR_DEPARTMENT_OTHER)
Admission: RE | Admit: 2017-11-08 | Discharge: 2017-11-08 | Disposition: A | Discharge status: Home / Self Care | Payer: Medicare HMO | Source: Ambulatory Visit | Attending: Family Medicine | Admitting: Family Medicine

## 2017-11-08 DIAGNOSIS — E2839 Other primary ovarian failure: Secondary | ICD-10-CM | POA: Insufficient documentation

## 2017-11-08 DIAGNOSIS — M810 Age-related osteoporosis without current pathological fracture: Secondary | ICD-10-CM | POA: Insufficient documentation

## 2017-11-08 DIAGNOSIS — M85831 Other specified disorders of bone density and structure, right forearm: Secondary | ICD-10-CM | POA: Diagnosis not present

## 2017-11-15 DIAGNOSIS — H2511 Age-related nuclear cataract, right eye: Secondary | ICD-10-CM | POA: Diagnosis not present

## 2017-11-15 DIAGNOSIS — H43813 Vitreous degeneration, bilateral: Secondary | ICD-10-CM | POA: Diagnosis not present

## 2017-11-15 DIAGNOSIS — H1851 Endothelial corneal dystrophy: Secondary | ICD-10-CM | POA: Diagnosis not present

## 2017-11-22 ENCOUNTER — Ambulatory Visit: Payer: Medicare HMO | Admitting: Family Medicine

## 2017-11-26 ENCOUNTER — Encounter: Payer: Self-pay | Admitting: Family Medicine

## 2017-11-30 NOTE — Telephone Encounter (Signed)
Per Chart Review 11-22-2017 appointment was made on April 17,2019 @ 9:17am with out check out rep.    Thank you

## 2017-12-01 DIAGNOSIS — M169 Osteoarthritis of hip, unspecified: Secondary | ICD-10-CM | POA: Diagnosis not present

## 2017-12-01 DIAGNOSIS — R21 Rash and other nonspecific skin eruption: Secondary | ICD-10-CM | POA: Diagnosis not present

## 2017-12-01 DIAGNOSIS — M179 Osteoarthritis of knee, unspecified: Secondary | ICD-10-CM | POA: Diagnosis not present

## 2017-12-01 DIAGNOSIS — Z8739 Personal history of other diseases of the musculoskeletal system and connective tissue: Secondary | ICD-10-CM | POA: Diagnosis not present

## 2017-12-01 NOTE — Telephone Encounter (Signed)
Felicia Lowe -- please see above mychart message about no show fee not been applied.

## 2017-12-03 ENCOUNTER — Encounter: Payer: Self-pay | Admitting: Family Medicine

## 2017-12-27 DIAGNOSIS — H35371 Puckering of macula, right eye: Secondary | ICD-10-CM | POA: Diagnosis not present

## 2017-12-27 DIAGNOSIS — H2511 Age-related nuclear cataract, right eye: Secondary | ICD-10-CM | POA: Diagnosis not present

## 2017-12-27 DIAGNOSIS — H1851 Endothelial corneal dystrophy: Secondary | ICD-10-CM | POA: Diagnosis not present

## 2018-01-12 ENCOUNTER — Other Ambulatory Visit: Payer: Self-pay | Admitting: Family Medicine

## 2018-01-12 ENCOUNTER — Encounter: Payer: Self-pay | Admitting: Family Medicine

## 2018-01-21 ENCOUNTER — Ambulatory Visit: Payer: Medicare HMO | Admitting: Family Medicine

## 2018-01-21 ENCOUNTER — Encounter: Payer: Self-pay | Admitting: Family Medicine

## 2018-01-21 VITALS — BP 122/50 | HR 81 | Temp 98.0°F | Resp 18 | Ht 62.0 in | Wt 183.0 lb

## 2018-01-21 DIAGNOSIS — I10 Essential (primary) hypertension: Secondary | ICD-10-CM

## 2018-01-21 DIAGNOSIS — E559 Vitamin D deficiency, unspecified: Secondary | ICD-10-CM | POA: Diagnosis not present

## 2018-01-21 DIAGNOSIS — R739 Hyperglycemia, unspecified: Secondary | ICD-10-CM

## 2018-01-21 DIAGNOSIS — H1851 Endothelial corneal dystrophy: Secondary | ICD-10-CM

## 2018-01-21 DIAGNOSIS — H18519 Endothelial corneal dystrophy, unspecified eye: Secondary | ICD-10-CM | POA: Insufficient documentation

## 2018-01-21 DIAGNOSIS — E079 Disorder of thyroid, unspecified: Secondary | ICD-10-CM

## 2018-01-21 LAB — LIPID PANEL
CHOL/HDL RATIO: 3
Cholesterol: 136 mg/dL (ref 0–200)
HDL: 44.6 mg/dL (ref >39.00)
LDL CALC: 59 mg/dL (ref 0–99)
NonHDL: 91.38
TRIGLYCERIDES: 162 mg/dL — AB (ref 0.0–149.0)
VLDL: 32.4 mg/dL (ref 0.0–40.0)

## 2018-01-21 LAB — CBC
HCT: 42.2 % (ref 36.0–46.0)
Hemoglobin: 14.2 g/dL (ref 12.0–15.0)
MCHC: 33.7 g/dL (ref 30.0–36.0)
MCV: 89.7 fl (ref 78.0–100.0)
PLATELETS: 279 10*3/uL (ref 150.0–400.0)
RBC: 4.71 Mil/uL (ref 3.87–5.11)
RDW: 14 % (ref 11.5–15.5)
WBC: 5.6 10*3/uL (ref 4.0–10.5)

## 2018-01-21 LAB — T4, FREE: FREE T4: 1.36 ng/dL (ref 0.60–1.60)

## 2018-01-21 LAB — COMPREHENSIVE METABOLIC PANEL
ALBUMIN: 4 g/dL (ref 3.5–5.2)
ALT: 11 U/L (ref 0–35)
AST: 9 U/L (ref 0–37)
Alkaline Phosphatase: 63 U/L (ref 39–117)
BILIRUBIN TOTAL: 0.3 mg/dL (ref 0.2–1.2)
BUN: 18 mg/dL (ref 6–23)
CALCIUM: 9.4 mg/dL (ref 8.4–10.5)
CHLORIDE: 103 meq/L (ref 96–112)
CO2: 30 meq/L (ref 19–32)
CREATININE: 0.73 mg/dL (ref 0.40–1.20)
GFR: 82.18 mL/min (ref >60.00)
Glucose, Bld: 108 mg/dL — ABNORMAL HIGH (ref 70–99)
Potassium: 3.8 mEq/L (ref 3.5–5.1)
SODIUM: 140 meq/L (ref 135–145)
Total Protein: 7 g/dL (ref 6.0–8.3)

## 2018-01-21 LAB — VITAMIN D 25 HYDROXY (VIT D DEFICIENCY, FRACTURES): VITD: 35 ng/mL (ref 30.00–100.00)

## 2018-01-21 LAB — HEMOGLOBIN A1C: HEMOGLOBIN A1C: 5.9 % (ref 4.6–6.5)

## 2018-01-21 LAB — TSH: TSH: 0.21 u[IU]/mL — AB (ref 0.35–4.50)

## 2018-01-21 MED ORDER — LEVOTHYROXINE SODIUM 125 MCG PO TABS: 125.0000 ug | ORAL_TABLET | Freq: Every day | ORAL | 3 refills | Status: DC

## 2018-01-21 NOTE — Patient Instructions (Signed)
shingrix is the new shingles shot, 2 shots over 2-6 months at pharmacy Hypertension Hypertension, commonly called high blood pressure, is when the force of blood pumping through the arteries is too strong. The arteries are the blood vessels that carry blood from the heart throughout the body. Hypertension forces the heart to work harder to pump blood and may cause arteries to become narrow or stiff. Having untreated or uncontrolled hypertension can cause heart attacks, strokes, kidney disease, and other problems. A blood pressure reading consists of a higher number over a lower number. Ideally, your blood pressure should be below 120/80. The first ("top") number is called the systolic pressure. It is a measure of the pressure in your arteries as your heart beats. The second ("bottom") number is called the diastolic pressure. It is a measure of the pressure in your arteries as the heart relaxes. What are the causes? The cause of this condition is not known. What increases the risk? Some risk factors for high blood pressure are under your control. Others are not. Factors you can change  Smoking.  Having type 2 diabetes mellitus, high cholesterol, or both.  Not getting enough exercise or physical activity.  Being overweight.  Having too much fat, sugar, calories, or salt (sodium) in your diet.  Drinking too much alcohol. Factors that are difficult or impossible to change  Having chronic kidney disease.  Having a family history of high blood pressure.  Age. Risk increases with age.  Race. You may be at higher risk if you are African-American.  Gender. Men are at higher risk than women before age 27. After age 63, women are at higher risk than men.  Having obstructive sleep apnea.  Stress. What are the signs or symptoms? Extremely high blood pressure (hypertensive crisis) may cause:  Headache.  Anxiety.  Shortness of breath.  Nosebleed.  Nausea and vomiting.  Severe chest  pain.  Jerky movements you cannot control (seizures).  How is this diagnosed? This condition is diagnosed by measuring your blood pressure while you are seated, with your arm resting on a surface. The cuff of the blood pressure monitor will be placed directly against the skin of your upper arm at the level of your heart. It should be measured at least twice using the same arm. Certain conditions can cause a difference in blood pressure between your right and left arms. Certain factors can cause blood pressure readings to be lower or higher than normal (elevated) for a short period of time:  When your blood pressure is higher when you are in a health care provider's office than when you are at home, this is called white coat hypertension. Most people with this condition do not need medicines.  When your blood pressure is higher at home than when you are in a health care provider's office, this is called masked hypertension. Most people with this condition may need medicines to control blood pressure.  If you have a high blood pressure reading during one visit or you have normal blood pressure with other risk factors:  You may be asked to return on a different day to have your blood pressure checked again.  You may be asked to monitor your blood pressure at home for 1 week or longer.  If you are diagnosed with hypertension, you may have other blood or imaging tests to help your health care provider understand your overall risk for other conditions. How is this treated? This condition is treated by making healthy lifestyle changes,  such as eating healthy foods, exercising more, and reducing your alcohol intake. Your health care provider may prescribe medicine if lifestyle changes are not enough to get your blood pressure under control, and if:  Your systolic blood pressure is above 130.  Your diastolic blood pressure is above 80.  Your personal target blood pressure may vary depending on your  medical conditions, your age, and other factors. Follow these instructions at home: Eating and drinking  Eat a diet that is high in fiber and potassium, and low in sodium, added sugar, and fat. An example eating plan is called the DASH (Dietary Approaches to Stop Hypertension) diet. To eat this way: ? Eat plenty of fresh fruits and vegetables. Try to fill half of your plate at each meal with fruits and vegetables. ? Eat whole grains, such as whole wheat pasta, brown rice, or whole grain bread. Fill about one quarter of your plate with whole grains. ? Eat or drink low-fat dairy products, such as skim milk or low-fat yogurt. ? Avoid fatty cuts of meat, processed or cured meats, and poultry with skin. Fill about one quarter of your plate with lean proteins, such as fish, chicken without skin, beans, eggs, and tofu. ? Avoid premade and processed foods. These tend to be higher in sodium, added sugar, and fat.  Reduce your daily sodium intake. Most people with hypertension should eat less than 1,500 mg of sodium a day.  Limit alcohol intake to no more than 1 drink a day for nonpregnant women and 2 drinks a day for men. One drink equals 12 oz of beer, 5 oz of wine, or 1 oz of hard liquor. Lifestyle  Work with your health care provider to maintain a healthy body weight or to lose weight. Ask what an ideal weight is for you.  Get at least 30 minutes of exercise that causes your heart to beat faster (aerobic exercise) most days of the week. Activities may include walking, swimming, or biking.  Include exercise to strengthen your muscles (resistance exercise), such as pilates or lifting weights, as part of your weekly exercise routine. Try to do these types of exercises for 30 minutes at least 3 days a week.  Do not use any products that contain nicotine or tobacco, such as cigarettes and e-cigarettes. If you need help quitting, ask your health care provider.  Monitor your blood pressure at home as  told by your health care provider.  Keep all follow-up visits as told by your health care provider. This is important. Medicines  Take over-the-counter and prescription medicines only as told by your health care provider. Follow directions carefully. Blood pressure medicines must be taken as prescribed.  Do not skip doses of blood pressure medicine. Doing this puts you at risk for problems and can make the medicine less effective.  Ask your health care provider about side effects or reactions to medicines that you should watch for. Contact a health care provider if:  You think you are having a reaction to a medicine you are taking.  You have headaches that keep coming back (recurring).  You feel dizzy.  You have swelling in your ankles.  You have trouble with your vision. Get help right away if:  You develop a severe headache or confusion.  You have unusual weakness or numbness.  You feel faint.  You have severe pain in your chest or abdomen.  You vomit repeatedly.  You have trouble breathing. Summary  Hypertension is when the force of  blood pumping through your arteries is too strong. If this condition is not controlled, it may put you at risk for serious complications.  Your personal target blood pressure may vary depending on your medical conditions, your age, and other factors. For most people, a normal blood pressure is less than 120/80.  Hypertension is treated with lifestyle changes, medicines, or a combination of both. Lifestyle changes include weight loss, eating a healthy, low-sodium diet, exercising more, and limiting alcohol. This information is not intended to replace advice given to you by your health care provider. Make sure you discuss any questions you have with your health care provider. Document Released: 06/22/2005 Document Revised: 05/20/2016 Document Reviewed: 05/20/2016 Elsevier Interactive Patient Education  [employer].

## 2018-01-21 NOTE — Assessment & Plan Note (Signed)
Supplement and monitor 

## 2018-01-21 NOTE — Progress Notes (Signed)
Subjective:  I acted as a Education administrator for Dr. Charlett Blake. [name], [place]  Patient ID: Felicia Lowe, female    DOB: 1940-09-08, 77 y.o.   MRN: [id number]  No chief complaint on file.   HPI  Patient is in today for a 6 week follow up. She is following up on her HTN, hyperglycemia and other medical concerns. No recent febrile illness or acute hospitalizations. Denies CP/palp/SOB/HA/congestion/fevers/GI or GU c/o. Taking meds as prescribed. She is worried about her husband but is managing her activities of daily living well.    Patient Care Team: Mosie Lukes, MD as PCP - General (Family Medicine) Stephannie Li, [place] as Consulting Physician (Ophthalmology) Valinda Party, MD as Consulting Physician (Rheumatology) Harold Hedge, Darrick Grinder, MD as Consulting Physician (Allergy and Immunology)   Past Medical History:  Diagnosis Date  . Allergy   . Arthritis    osteoarthritis, severe, b/l knees &b/l hips  replaced  . BPV (benign positional vertigo) 02/19/2013   mild  . Chicken pox as a child  . GERD (gastroesophageal reflux disease) 05/27/2014  . H/O breast biopsy    benign, on left  . Hypertension   . Kidney stone 2000   nephritis as a child  . Measles as a child  . Mumps as a child  . Obesity   . Osteoporosis   . Preventative health care 11/25/2014  . Pruritus 01/21/2017  . Scabies   . Skin lesion of face 05/18/2012  . Thyroid disease   . Unsteady gait 11/25/2014  . UTI (urinary tract infection) 12/11/2012  . Viral respiratory illness 05/21/2013  . Vitamin D deficiency     Past Surgical History:  Procedure Laterality Date  . CATARACT EXTRACTION Left 2017  . EYE SURGERY Left 2019   cataract removal left  . HIP SURGERY     both hips  . KNEE SURGERY     both knees replaced    Family History  Problem Relation Age of Onset  . Hyperlipidemia Mother   . Hypertension Mother   . Heart attack Mother   . Cancer Father        lung- smoker  . Heart attack Father        X 2  .  Thyroid disease Brother   . Hypertension Son   . Cancer Paternal Grandmother     Social History   Socioeconomic History  . Marital status: Unknown    Spouse name: Not on file  . Number of children: Not on file  . Years of education: Not on file  . Highest education level: Not on file  Occupational History  . Not on file  Social Needs  . Financial resource strain: Not on file  . Food insecurity:    Worry: Not on file    Inability: Not on file  . Transportation needs:    Medical: Not on file    Non-medical: Not on file  Tobacco Use  . Smoking status: Never Smoker  . Smokeless tobacco: Never Used  Substance and Sexual Activity  . Alcohol use: Yes    Comment: Occasional glass of wine  . Drug use: No  . Sexual activity: Never  Lifestyle  . Physical activity:    Days per week: Not on file    Minutes per session: Not on file  . Stress: Not on file  Relationships  . Social connections:    Talks on phone: Not on file    Gets together: Not on file  Attends religious service: Not on file    Active member of club or organization: Not on file    Attends meetings of clubs or organizations: Not on file    Relationship status: Not on file  . Intimate partner violence:    Fear of current or ex partner: Not on file    Emotionally abused: Not on file    Physically abused: Not on file    Forced sexual activity: Not on file  Other Topics Concern  . Not on file  Social History Narrative  . Not on file    Outpatient Medications Prior to Visit  Medication Sig Dispense Refill  . Ascorbic Acid (VITAMIN C) 1000 MG tablet Take 1,000 mg by mouth daily.    Marland Kitchen aspirin 81 MG tablet Take 81 mg by mouth daily.    . Cholecalciferol (VITAMIN D) 2000 UNITS tablet Take 2,000 Units by mouth daily.    . Coenzyme Q10 (CO Q 10) 100 MG CAPS Take 400 mg by mouth daily.    . furosemide (LASIX) 20 MG tablet Take 1 tablet (20 mg total) by mouth daily as needed. 30 tablet 4  .  lisinopril-hydrochlorothiazide (PRINZIDE,ZESTORETIC) 10-12.5 MG tablet TAKE 1 TABLET BY MOUTH EVERY DAY 90 tablet 1  . Omega-3 Fatty Acids (FISH OIL) 1000 MG CPDR Take 1 capsule by mouth daily.    . Probiotic Product (PROBIOTIC DAILY PO) Take by mouth.    . levothyroxine (SYNTHROID, LEVOTHROID) 125 MCG tablet Take 1 tablet (125 mcg total) by mouth daily. Synthroid 90 tablet 3  . hydrOXYzine (ATARAX/VISTARIL) 10 MG tablet Take 1-2 tablets (10-20 mg total) by mouth 2 (two) times daily. 60 tablet 2  . lisinopril (PRINIVIL,ZESTRIL) 10 MG tablet Take 1 tablet (10 mg total) by mouth 2 (two) times daily. 90 tablet 3  . loratadine (CLARITIN) 10 MG tablet Take 1 tablet (10 mg total) by mouth 2 (two) times daily as needed for allergies. 30 tablet 11  . mupirocin ointment (BACTROBAN) 2 % Place 1 application 2 (two) times daily into the nose. (Patient not taking: Reported on 10/20/2017) 22 g 0  . nitroGLYCERIN (NITROSTAT) 0.4 MG SL tablet Place 1 tablet (0.4 mg total) under the tongue every 5 (five) minutes as needed for chest pain. 50 tablet 3  . triamcinolone cream (KENALOG) 0.1 % Apply 1 application topically 2 (two) times daily. (Patient not taking: Reported on 10/20/2017) 453.6 g 3   No facility-administered medications prior to visit.     Allergies  Allergen Reactions  . Eggs Or Egg-Derived Products Shortness Of Breath and Rash  . Sulfa Antibiotics Nausea And Vomiting  . Keflex [Cephalexin] Rash    Rash   . Penicillins Rash  . Poison Ivy Extract [Poison Ivy Extract] Rash    Review of Systems  Constitutional: Negative for fever and malaise/fatigue.  HENT: Negative for congestion.   Eyes: Negative for blurred vision.  Respiratory: Negative for shortness of breath.   Cardiovascular: Negative for chest pain, palpitations and leg swelling.  Gastrointestinal: Negative for abdominal pain, blood in stool and nausea.  Genitourinary: Negative for dysuria and frequency.  Musculoskeletal: Negative for  falls.  Skin: Negative for rash.  Neurological: Negative for dizziness, loss of consciousness and headaches.  Endo/Heme/Allergies: Negative for environmental allergies.  Psychiatric/Behavioral: Negative for depression. The patient is not nervous/anxious.        Objective:    Physical Exam  Constitutional: She is oriented to person, place, and time. She appears well-developed and well-nourished. No distress.  HENT:  Head: Normocephalic and atraumatic.  Nose: Nose normal.  Eyes: Right eye exhibits no discharge. Left eye exhibits no discharge.  Neck: Normal range of motion. Neck supple.  Cardiovascular: Normal rate and regular rhythm.  No murmur heard. Pulmonary/Chest: Effort normal and breath sounds normal.  Abdominal: Soft. Bowel sounds are normal. There is no tenderness.  Musculoskeletal: She exhibits no edema.  Neurological: She is alert and oriented to person, place, and time.  Skin: Skin is warm and dry.  Psychiatric: She has a normal mood and affect.  Nursing note and vitals reviewed.   BP (!) 122/50 (BP Location: Left Arm, Patient Position: Sitting, Cuff Size: Large)   Pulse 81   Temp 98 F (36.7 C) (Oral)   Resp 18   Ht 5\' 2"  (1.575 m)   Wt 183 lb (83 kg)   SpO2 97%   BMI 33.47 kg/m  Wt Readings from Last 3 Encounters:  01/21/18 183 lb (83 kg)  10/20/17 185 lb 6.4 oz (84.1 kg)  05/25/17 187 lb 3.2 oz (84.9 kg)   BP Readings from Last 3 Encounters:  01/21/18 (!) 122/50  10/20/17 138/70  05/25/17 128/68     Immunization History  Administered Date(s) Administered  . Influenza, Quadrivalent, Recombinant, Inj, Pf 05/15/2016, 05/25/2017  . Influenza,trivalent, recombinat, inj, PF 05/17/2014, 05/07/2015  . Pneumococcal Conjugate-13 03/06/2016  . Pneumococcal Polysaccharide-23 07/06/2010  . Td 03/06/2016  . Tdap 07/06/2001  . Zoster 04/06/2011    Health Maintenance  Topic Date Due  . INFLUENZA VACCINE  02/03/2018  . TETANUS/TDAP  03/06/2026  . DEXA SCAN   Completed  . PNA vac Low Risk Adult  Completed    Lab Results  Component Value Date   WBC 5.6 01/21/2018   HGB 14.2 01/21/2018   HCT 42.2 01/21/2018   PLT 279.0 01/21/2018   GLUCOSE 108 (H) 01/21/2018   CHOL 136 01/21/2018   TRIG 162.0 (H) 01/21/2018   HDL 44.60 01/21/2018   LDLCALC 59 01/21/2018   ALT 11 01/21/2018   AST 9 01/21/2018   NA 140 01/21/2018   K 3.8 01/21/2018   CL 103 01/21/2018   CREATININE 0.73 01/21/2018   BUN 18 01/21/2018   CO2 30 01/21/2018   TSH 0.21 (L) 01/21/2018   HGBA1C 5.9 01/21/2018    Lab Results  Component Value Date   TSH 0.21 (L) 01/21/2018   Lab Results  Component Value Date   WBC 5.6 01/21/2018   HGB 14.2 01/21/2018   HCT 42.2 01/21/2018   MCV 89.7 01/21/2018   PLT 279.0 01/21/2018   Lab Results  Component Value Date   NA 140 01/21/2018   K 3.8 01/21/2018   CO2 30 01/21/2018   GLUCOSE 108 (H) 01/21/2018   BUN 18 01/21/2018   CREATININE 0.73 01/21/2018   BILITOT 0.3 01/21/2018   ALKPHOS 63 01/21/2018   AST 9 01/21/2018   ALT 11 01/21/2018   PROT 7.0 01/21/2018   ALBUMIN 4.0 01/21/2018   CALCIUM 9.4 01/21/2018   GFR 82.18 01/21/2018   Lab Results  Component Value Date   CHOL 136 01/21/2018   Lab Results  Component Value Date   HDL 44.60 01/21/2018   Lab Results  Component Value Date   LDLCALC 59 01/21/2018   Lab Results  Component Value Date   TRIG 162.0 (H) 01/21/2018   Lab Results  Component Value Date   CHOLHDL 3 01/21/2018   Lab Results  Component Value Date   HGBA1C 5.9 01/21/2018  Assessment & Plan:   Problem List Items Addressed This Visit    Thyroid disease    On Levothyroxine, continue to monitor      Relevant Medications   levothyroxine (SYNTHROID, LEVOTHROID) 125 MCG tablet   Other Relevant Orders   Lipid panel (Completed)   TSH (Completed)   T4, free (Completed)   Essential hypertension - Primary    Well controlled, no changes to meds. Encouraged heart healthy diet such  as the DASH diet and exercise as tolerated.       Relevant Orders   CBC (Completed)   Comprehensive metabolic panel (Completed)   Lipid panel (Completed)   Vitamin D deficiency    Supplement and monitor      Relevant Orders   VITAMIN D 25 Hydroxy (Vit-D Deficiency, Fractures) (Completed)   Hyperglycemia    hgba1c acceptable, minimize simple carbs. Increase exercise as tolerated.       Relevant Orders   Hemoglobin A1c (Completed)   Lipid panel (Completed)   Fuchs' corneal dystrophy    Discovered during her cataract surgery in the left eye. Right eye is due.          I have discontinued Shauntay Brunelli "Susan"'s triamcinolone cream, lisinopril, nitroGLYCERIN, loratadine, hydrOXYzine, and mupirocin ointment. I am also having her maintain her vitamin C, Vitamin D, Co Q 10, Fish Oil, aspirin, Probiotic Product (PROBIOTIC DAILY PO), furosemide, lisinopril-hydrochlorothiazide, and levothyroxine.  Meds ordered this encounter  Medications  . levothyroxine (SYNTHROID, LEVOTHROID) 125 MCG tablet    Sig: Take 1 tablet (125 mcg total) by mouth daily. Synthroid    Dispense:  90 tablet    Refill:  3   CMA served as scribe during this visit. History, Physical and Plan performed by medical provider. Documentation and orders reviewed and attested to.  Penni Homans, MD

## 2018-01-21 NOTE — Assessment & Plan Note (Signed)
On Levothyroxine, continue to monitor 

## 2018-01-21 NOTE — Assessment & Plan Note (Signed)
hgba1c acceptable, minimize simple carbs. Increase exercise as tolerated.  

## 2018-01-21 NOTE — Assessment & Plan Note (Signed)
Well controlled, no changes to meds. Encouraged heart healthy diet such as the DASH diet and exercise as tolerated.  °

## 2018-01-21 NOTE — Assessment & Plan Note (Signed)
Discovered during her cataract surgery in the left eye. Right eye is due.

## 2018-01-25 ENCOUNTER — Encounter: Payer: Self-pay | Admitting: Family Medicine

## 2018-02-02 DIAGNOSIS — H35341 Macular cyst, hole, or pseudohole, right eye: Secondary | ICD-10-CM | POA: Diagnosis not present

## 2018-02-02 DIAGNOSIS — H35363 Drusen (degenerative) of macula, bilateral: Secondary | ICD-10-CM | POA: Diagnosis not present

## 2018-02-02 DIAGNOSIS — H2511 Age-related nuclear cataract, right eye: Secondary | ICD-10-CM | POA: Diagnosis not present

## 2018-02-02 DIAGNOSIS — H35371 Puckering of macula, right eye: Secondary | ICD-10-CM | POA: Diagnosis not present

## 2018-03-30 DIAGNOSIS — H26492 Other secondary cataract, left eye: Secondary | ICD-10-CM | POA: Diagnosis not present

## 2018-03-30 DIAGNOSIS — Z961 Presence of intraocular lens: Secondary | ICD-10-CM | POA: Diagnosis not present

## 2018-03-30 DIAGNOSIS — H1851 Endothelial corneal dystrophy: Secondary | ICD-10-CM | POA: Diagnosis not present

## 2018-03-30 DIAGNOSIS — H35341 Macular cyst, hole, or pseudohole, right eye: Secondary | ICD-10-CM | POA: Diagnosis not present

## 2018-03-30 DIAGNOSIS — H25811 Combined forms of age-related cataract, right eye: Secondary | ICD-10-CM | POA: Diagnosis not present

## 2018-03-30 DIAGNOSIS — H43813 Vitreous degeneration, bilateral: Secondary | ICD-10-CM | POA: Diagnosis not present

## 2018-03-30 DIAGNOSIS — H35371 Puckering of macula, right eye: Secondary | ICD-10-CM | POA: Diagnosis not present

## 2018-03-30 DIAGNOSIS — H527 Unspecified disorder of refraction: Secondary | ICD-10-CM | POA: Diagnosis not present

## 2018-04-26 ENCOUNTER — Encounter: Payer: Self-pay | Admitting: Family Medicine

## 2018-04-26 MED ORDER — LEVOTHYROXINE SODIUM 125 MCG PO TABS: 125.0000 ug | ORAL_TABLET | Freq: Every day | ORAL | 0 refills | Status: DC

## 2018-05-30 ENCOUNTER — Encounter: Payer: Self-pay | Admitting: Family Medicine

## 2018-05-30 ENCOUNTER — Ambulatory Visit (INDEPENDENT_AMBULATORY_CARE_PROVIDER_SITE_OTHER): Payer: Medicare HMO | Admitting: Family Medicine

## 2018-05-30 VITALS — BP 110/82 | HR 76 | Temp 97.2°F | Resp 18 | Ht 62.0 in | Wt 182.0 lb

## 2018-05-30 DIAGNOSIS — E559 Vitamin D deficiency, unspecified: Secondary | ICD-10-CM

## 2018-05-30 DIAGNOSIS — E6609 Other obesity due to excess calories: Secondary | ICD-10-CM | POA: Diagnosis not present

## 2018-05-30 DIAGNOSIS — R739 Hyperglycemia, unspecified: Secondary | ICD-10-CM | POA: Diagnosis not present

## 2018-05-30 DIAGNOSIS — E079 Disorder of thyroid, unspecified: Secondary | ICD-10-CM | POA: Diagnosis not present

## 2018-05-30 DIAGNOSIS — R54 Age-related physical debility: Secondary | ICD-10-CM | POA: Diagnosis not present

## 2018-05-30 DIAGNOSIS — Z23 Encounter for immunization: Secondary | ICD-10-CM | POA: Diagnosis not present

## 2018-05-30 DIAGNOSIS — E785 Hyperlipidemia, unspecified: Secondary | ICD-10-CM | POA: Diagnosis not present

## 2018-05-30 DIAGNOSIS — Z0000 Encounter for general adult medical examination without abnormal findings: Secondary | ICD-10-CM

## 2018-05-30 DIAGNOSIS — N200 Calculus of kidney: Secondary | ICD-10-CM | POA: Diagnosis not present

## 2018-05-30 DIAGNOSIS — M8580 Other specified disorders of bone density and structure, unspecified site: Secondary | ICD-10-CM

## 2018-05-30 DIAGNOSIS — Z1239 Encounter for other screening for malignant neoplasm of breast: Secondary | ICD-10-CM

## 2018-05-30 DIAGNOSIS — I10 Essential (primary) hypertension: Secondary | ICD-10-CM | POA: Diagnosis not present

## 2018-05-30 LAB — COMPREHENSIVE METABOLIC PANEL
ALBUMIN: 4.1 g/dL (ref 3.5–5.2)
ALK PHOS: 63 U/L (ref 39–117)
ALT: 9 U/L (ref 0–35)
AST: 12 U/L (ref 0–37)
BUN: 13 mg/dL (ref 6–23)
CALCIUM: 9.5 mg/dL (ref 8.4–10.5)
CO2: 27 mEq/L (ref 19–32)
CREATININE: 0.69 mg/dL (ref 0.40–1.20)
Chloride: 104 mEq/L (ref 96–112)
GFR: 87.62 mL/min (ref >60.00)
Glucose, Bld: 92 mg/dL (ref 70–99)
Potassium: 3.9 mEq/L (ref 3.5–5.1)
Sodium: 141 mEq/L (ref 135–145)
TOTAL PROTEIN: 6.5 g/dL (ref 6.0–8.3)
Total Bilirubin: 0.5 mg/dL (ref 0.2–1.2)

## 2018-05-30 LAB — CBC
HCT: 43.3 % (ref 36.0–46.0)
HEMOGLOBIN: 14.4 g/dL (ref 12.0–15.0)
MCHC: 33.2 g/dL (ref 30.0–36.0)
MCV: 90.2 fl (ref 78.0–100.0)
PLATELETS: 274 10*3/uL (ref 150.0–400.0)
RBC: 4.81 Mil/uL (ref 3.87–5.11)
RDW: 14.1 % (ref 11.5–15.5)
WBC: 5.8 10*3/uL (ref 4.0–10.5)

## 2018-05-30 LAB — LIPID PANEL
CHOLESTEROL: 142 mg/dL (ref 0–200)
HDL: 43.6 mg/dL (ref >39.00)
LDL Cholesterol: 67 mg/dL (ref 0–99)
NonHDL: 98.85
TRIGLYCERIDES: 157 mg/dL — AB (ref 0.0–149.0)
Total CHOL/HDL Ratio: 3
VLDL: 31.4 mg/dL (ref 0.0–40.0)

## 2018-05-30 LAB — VITAMIN D 25 HYDROXY (VIT D DEFICIENCY, FRACTURES): VITD: 38.78 ng/mL (ref 30.00–100.00)

## 2018-05-30 LAB — TSH: TSH: 0.26 u[IU]/mL — ABNORMAL LOW (ref 0.35–4.50)

## 2018-05-30 LAB — HEMOGLOBIN A1C: Hgb A1c MFr Bld: 5.8 % (ref 4.6–6.5)

## 2018-05-30 NOTE — Assessment & Plan Note (Signed)
Supplement and monitor 

## 2018-05-30 NOTE — Assessment & Plan Note (Signed)
On Levothyroxine, continue to monitor 

## 2018-05-30 NOTE — Assessment & Plan Note (Signed)
Encouraged to get adequate exercise, calcium and vitamin d intake 

## 2018-05-30 NOTE — Assessment & Plan Note (Signed)
Asymptomatic check Urinalysis

## 2018-05-30 NOTE — Assessment & Plan Note (Signed)
Encouraged heart healthy diet, increase exercise, avoid trans fats, consider a krill oil cap daily 

## 2018-05-30 NOTE — Assessment & Plan Note (Signed)
Encouraged DASH diet, decrease po intake and increase exercise as tolerated. Needs 7-8 hours of sleep nightly. Avoid trans fats, eat small, frequent meals every 4-5 hours with lean proteins, complex carbs and healthy fats. Minimize simple carbs 

## 2018-05-30 NOTE — Assessment & Plan Note (Signed)
Patient encouraged to maintain heart healthy diet, regular exercise, adequate sleep. Consider daily probiotics. Take medications as prescribed 

## 2018-05-30 NOTE — Assessment & Plan Note (Signed)
hgba1c acceptable, minimize simple carbs. Increase exercise as tolerated.  

## 2018-05-30 NOTE — Assessment & Plan Note (Signed)
Well controlled, no changes to meds. Encouraged heart healthy diet such as the DASH diet and exercise as tolerated.  °

## 2018-05-30 NOTE — Patient Instructions (Addendum)
Bone density shows osteopenia, which is thinner than normal but not as bad as osteoporosis. Recommend calcium intake of 1200 to 1500 mg daily, divided into roughly 3 doses. Best source is the diet and a single dairy serving is about 500 mg, a supplement of calcium citrate once or twice daily to balance diet is fine if not getting enough in diet. Also need Vitamin D 2000 IU caps, 1 cap daily if not already taking vitamin D. Also recommend weight baring exercise on hips and upper body to keep bones strong   Shingrix is the new shingles shot. 2 shots over 2-6 months at pharmacy    Preventive Care 65 Years and Older, Female Preventive care refers to lifestyle choices and visits with your health care provider that can promote health and wellness. What does preventive care include?  A yearly physical exam. This is also called an annual well check.  Dental exams once or twice a year.  Routine eye exams. Ask your health care provider how often you should have your eyes checked.  Personal lifestyle choices, including: ? Daily care of your teeth and gums. ? Regular physical activity. ? Eating a healthy diet. ? Avoiding tobacco and drug use. ? Limiting alcohol use. ? Practicing safe sex. ? Taking low-dose aspirin every day. ? Taking vitamin and mineral supplements as recommended by your health care provider. What happens during an annual well check? The services and screenings done by your health care provider during your annual well check will depend on your age, overall health, lifestyle risk factors, and family history of disease. Counseling Your health care provider may ask you questions about your:  Alcohol use.  Tobacco use.  Drug use.  Emotional well-being.  Home and relationship well-being.  Sexual activity.  Eating habits.  History of falls.  Memory and ability to understand (cognition).  Work and work environment.  Reproductive health.  Screening You may have the  following tests or measurements:  Height, weight, and BMI.  Blood pressure.  Lipid and cholesterol levels. These may be checked every 5 years, or more frequently if you are over 50 years old.  Skin check.  Lung cancer screening. You may have this screening every year starting at age 55 if you have a 30-pack-year history of smoking and currently smoke or have quit within the past 15 years.  Fecal occult blood test (FOBT) of the stool. You may have this test every year starting at age 50.  Flexible sigmoidoscopy or colonoscopy. You may have a sigmoidoscopy every 5 years or a colonoscopy every 10 years starting at age 50.  Hepatitis C blood test.  Hepatitis B blood test.  Sexually transmitted disease (STD) testing.  Diabetes screening. This is done by checking your blood sugar (glucose) after you have not eaten for a while (fasting). You may have this done every 1-3 years.  Bone density scan. This is done to screen for osteoporosis. You may have this done starting at age 77.  Mammogram. This may be done every 1-2 years. Talk to your health care provider about how often you should have regular mammograms.  Talk with your health care provider about your test results, treatment options, and if necessary, the need for more tests. Vaccines Your health care provider may recommend certain vaccines, such as:  Influenza vaccine. This is recommended every year.  Tetanus, diphtheria, and acellular pertussis (Tdap, Td) vaccine. You may need a Td booster every 10 years.  Varicella vaccine. You may need this if   you have not been vaccinated.  Zoster vaccine. You may need this after age 60.  Measles, mumps, and rubella (MMR) vaccine. You may need at least one dose of MMR if you were born in 1957 or later. You may also need a second dose.  Pneumococcal 13-valent conjugate (PCV13) vaccine. One dose is recommended after age 77.  Pneumococcal polysaccharide (PPSV23) vaccine. One dose is  recommended after age 77.  Meningococcal vaccine. You may need this if you have certain conditions.  Hepatitis A vaccine. You may need this if you have certain conditions or if you travel or work in places where you may be exposed to hepatitis A.  Hepatitis B vaccine. You may need this if you have certain conditions or if you travel or work in places where you may be exposed to hepatitis B.  Haemophilus influenzae type b (Hib) vaccine. You may need this if you have certain conditions.  Talk to your health care provider about which screenings and vaccines you need and how often you need them. This information is not intended to replace advice given to you by your health care provider. Make sure you discuss any questions you have with your health care provider. Document Released: 07/19/2015 Document Revised: 03/11/2016 Document Reviewed: 04/23/2015 Elsevier Interactive Patient Education  2018 [employer]  

## 2018-06-01 NOTE — Progress Notes (Signed)
Subjective:    Patient ID: Felicia Lowe, female    DOB: February 27, 1941, 77 y.o.   MRN: [id number]  Chief Complaint  Patient presents with  . Annual Exam    HPI Patient is in today for annual preventative exam and follow up on chronic medical concerns including hyperglycemia, obesity, hyperlipidemia and kidney stones and more. She feels well today. No recent febrile illness or hospitalizations. No polyuria or polydipsia. She is trying to maintain a heart healthy diet and minimize carbohydrates. Denies CP/palp/SOB/HA/congestion/fevers/GI or GU c/o. Taking meds as prescribed. She is doing well with activities of daily living but does endorse some fatigue.   Past Medical History:  Diagnosis Date  . Allergy   . Arthritis    osteoarthritis, severe, b/l knees &b/l hips  replaced  . BPV (benign positional vertigo) 02/19/2013   mild  . Chicken pox as a child  . GERD (gastroesophageal reflux disease) 05/27/2014  . H/O breast biopsy    benign, on left  . Hypertension   . Kidney stone 2000   nephritis as a child  . Measles as a child  . Mumps as a child  . Obesity   . Osteoporosis   . Preventative health care 11/25/2014  . Pruritus 01/21/2017  . Scabies   . Skin lesion of face 05/18/2012  . Thyroid disease   . Unsteady gait 11/25/2014  . UTI (urinary tract infection) 12/11/2012  . Viral respiratory illness 05/21/2013  . Vitamin D deficiency     Past Surgical History:  Procedure Laterality Date  . CATARACT EXTRACTION Left 2017  . EYE SURGERY Left 2019   cataract removal left  . HIP SURGERY     both hips  . KNEE SURGERY     both knees replaced    Family History  Problem Relation Age of Onset  . Hyperlipidemia Mother   . Hypertension Mother   . Heart attack Mother   . Cancer Father        lung- smoker  . Heart attack Father        X 2  . Thyroid disease Brother   . Hypertension Son   . Rashes / Skin problems Son   . Hernia Son        umbilical  . Cancer Paternal  Grandmother   . Macular degeneration Brother     Social History   Socioeconomic History  . Marital status: Unknown    Spouse name: Not on file  . Number of children: Not on file  . Years of education: Not on file  . Highest education level: Not on file  Occupational History  . Not on file  Social Needs  . Financial resource strain: Not on file  . Food insecurity:    Worry: Not on file    Inability: Not on file  . Transportation needs:    Medical: Not on file    Non-medical: Not on file  Tobacco Use  . Smoking status: Never Smoker  . Smokeless tobacco: Never Used  Substance and Sexual Activity  . Alcohol use: Yes    Comment: Occasional glass of wine  . Drug use: No  . Sexual activity: Never  Lifestyle  . Physical activity:    Days per week: Not on file    Minutes per session: Not on file  . Stress: Not on file  Relationships  . Social connections:    Talks on phone: Not on file    Gets together: Not on file  Attends religious service: Not on file    Active member of club or organization: Not on file    Attends meetings of clubs or organizations: Not on file    Relationship status: Not on file  . Intimate partner violence:    Fear of current or ex partner: Not on file    Emotionally abused: Not on file    Physically abused: Not on file    Forced sexual activity: Not on file  Other Topics Concern  . Not on file  Social History Narrative  . Not on file    Outpatient Medications Prior to Visit  Medication Sig Dispense Refill  . Ascorbic Acid (VITAMIN C) 1000 MG tablet Take 1,000 mg by mouth daily.    Marland Kitchen aspirin 81 MG tablet Take 81 mg by mouth daily.    . Cholecalciferol (VITAMIN D) 2000 UNITS tablet Take 2,000 Units by mouth daily.    . Coenzyme Q10 (CO Q 10) 100 MG CAPS Take 400 mg by mouth daily.    . furosemide (LASIX) 20 MG tablet Take 1 tablet (20 mg total) by mouth daily as needed. 30 tablet 4  . levothyroxine (SYNTHROID, LEVOTHROID) 125 MCG tablet Take  1 tablet (125 mcg total) by mouth daily before breakfast. 90 tablet 0  . lisinopril-hydrochlorothiazide (PRINZIDE,ZESTORETIC) 10-12.5 MG tablet TAKE 1 TABLET BY MOUTH EVERY DAY 90 tablet 1  . Omega-3 Fatty Acids (FISH OIL) 1000 MG CPDR Take 1 capsule by mouth daily.    . Probiotic Product (PROBIOTIC DAILY PO) Take by mouth.     No facility-administered medications prior to visit.     Allergies  Allergen Reactions  . Eggs Or Egg-Derived Products Shortness Of Breath and Rash  . Sulfa Antibiotics Nausea And Vomiting  . Keflex [Cephalexin] Rash    Rash   . Penicillins Rash  . Poison Ivy Extract [Poison Ivy Extract] Rash    Review of Systems  Constitutional: Negative for chills, fever and malaise/fatigue.  HENT: Negative for congestion and hearing loss.   Eyes: Negative for discharge.  Respiratory: Negative for cough, sputum production and shortness of breath.   Cardiovascular: Negative for chest pain, palpitations and leg swelling.  Gastrointestinal: Negative for abdominal pain, blood in stool, constipation, diarrhea, heartburn, nausea and vomiting.  Genitourinary: Negative for dysuria, frequency, hematuria and urgency.  Musculoskeletal: Negative for back pain, falls and myalgias.  Skin: Negative for rash.  Neurological: Negative for dizziness, sensory change, loss of consciousness, weakness and headaches.  Endo/Heme/Allergies: Negative for environmental allergies. Does not bruise/bleed easily.  Psychiatric/Behavioral: Negative for depression and suicidal ideas. The patient is not nervous/anxious and does not have insomnia.        Objective:    Physical Exam  Constitutional: She is oriented to person, place, and time. No distress.  HENT:  Head: Normocephalic and atraumatic.  Right Ear: External ear normal.  Left Ear: External ear normal.  Nose: Nose normal.  Mouth/Throat: Oropharynx is clear and moist. No oropharyngeal exudate.  Eyes: Pupils are equal, round, and reactive to  light. Conjunctivae are normal. Right eye exhibits no discharge. Left eye exhibits no discharge. No scleral icterus.  Neck: Normal range of motion. Neck supple. No thyromegaly present.  Cardiovascular: Normal rate, regular rhythm, normal heart sounds and intact distal pulses.  No murmur heard. Pulmonary/Chest: Effort normal and breath sounds normal. No respiratory distress. She has no wheezes. She has no rales.  Abdominal: Soft. Bowel sounds are normal. She exhibits no distension and no mass. There is  no tenderness.  Musculoskeletal: Normal range of motion. She exhibits no edema or tenderness.  Lymphadenopathy:    She has no cervical adenopathy.  Neurological: She is alert and oriented to person, place, and time. She has normal reflexes. She displays normal reflexes. No cranial nerve deficit. Coordination normal.  Skin: Skin is warm and dry. No rash noted. She is not diaphoretic.    BP 110/82 (BP Location: Left Arm, Patient Position: Sitting, Cuff Size: Large)   Pulse 76   Temp (!) 97.2 F (36.2 C) (Oral)   Resp 18   Ht 5\' 2"  (1.575 m)   Wt 182 lb (82.6 kg)   SpO2 96%   BMI 33.29 kg/m  Wt Readings from Last 3 Encounters:  05/30/18 182 lb (82.6 kg)  01/21/18 183 lb (83 kg)  10/20/17 185 lb 6.4 oz (84.1 kg)     Lab Results  Component Value Date   WBC 5.8 05/30/2018   HGB 14.4 05/30/2018   HCT 43.3 05/30/2018   PLT 274.0 05/30/2018   GLUCOSE 92 05/30/2018   CHOL 142 05/30/2018   TRIG 157.0 (H) 05/30/2018   HDL 43.60 05/30/2018   LDLCALC 67 05/30/2018   ALT 9 05/30/2018   AST 12 05/30/2018   NA 141 05/30/2018   K 3.9 05/30/2018   CL 104 05/30/2018   CREATININE 0.69 05/30/2018   BUN 13 05/30/2018   CO2 27 05/30/2018   TSH 0.26 (L) 05/30/2018   HGBA1C 5.8 05/30/2018    Lab Results  Component Value Date   TSH 0.26 (L) 05/30/2018   Lab Results  Component Value Date   WBC 5.8 05/30/2018   HGB 14.4 05/30/2018   HCT 43.3 05/30/2018   MCV 90.2 05/30/2018   PLT 274.0  05/30/2018   Lab Results  Component Value Date   NA 141 05/30/2018   K 3.9 05/30/2018   CO2 27 05/30/2018   GLUCOSE 92 05/30/2018   BUN 13 05/30/2018   CREATININE 0.69 05/30/2018   BILITOT 0.5 05/30/2018   ALKPHOS 63 05/30/2018   AST 12 05/30/2018   ALT 9 05/30/2018   PROT 6.5 05/30/2018   ALBUMIN 4.1 05/30/2018   CALCIUM 9.5 05/30/2018   GFR 87.62 05/30/2018   Lab Results  Component Value Date   CHOL 142 05/30/2018   Lab Results  Component Value Date   HDL 43.60 05/30/2018   Lab Results  Component Value Date   LDLCALC 67 05/30/2018   Lab Results  Component Value Date   TRIG 157.0 (H) 05/30/2018   Lab Results  Component Value Date   CHOLHDL 3 05/30/2018   Lab Results  Component Value Date   HGBA1C 5.8 05/30/2018       Assessment & Plan:   Problem List Items Addressed This Visit    Thyroid disease    On Levothyroxine, continue to monitor      Essential hypertension    Well controlled, no changes to meds. Encouraged heart healthy diet such as the DASH diet and exercise as tolerated.       Relevant Orders   CBC (Completed)   Comprehensive metabolic panel (Completed)   Lipid panel (Completed)   TSH (Completed)   Vitamin D deficiency    Supplement and monitor      Relevant Orders   VITAMIN D 25 Hydroxy (Vit-D Deficiency, Fractures) (Completed)   Kidney stone    Asymptomatic check Urinalysis      Obesity    Encouraged DASH diet, decrease po intake and increase  exercise as tolerated. Needs 7-8 hours of sleep nightly. Avoid trans fats, eat small, frequent meals every 4-5 hours with lean proteins, complex carbs and healthy fats. Minimize simple carbs      Preventative health care    Patient encouraged to maintain heart healthy diet, regular exercise, adequate sleep. Consider daily probiotics. Take medications as prescribed      Relevant Orders   CBC (Completed)   Comprehensive metabolic panel (Completed)   TSH (Completed)   Hyperglycemia     hgba1c acceptable, minimize simple carbs. Increase exercise as tolerated.      Relevant Orders   Hemoglobin A1c (Completed)   Hyperlipidemia    Encouraged heart healthy diet, increase exercise, avoid trans fats, consider a krill oil cap daily      Osteopenia    Encouraged to get adequate exercise, calcium and vitamin d intake       Other Visit Diagnoses    Age-related physical debility    -  Primary   Relevant Orders   Ambulatory referral to Physical Therapy   Breast cancer screening       Relevant Orders   MM 3D SCREEN BREAST BILATERAL      I am having Felicia Lowe "Felicia Lowe" maintain her vitamin C, Vitamin D, Co Q 10, Fish Oil, aspirin, Probiotic Product (PROBIOTIC DAILY PO), furosemide, lisinopril-hydrochlorothiazide, and levothyroxine.  No orders of the defined types were placed in this encounter.    Penni Homans, MD

## 2018-06-09 DIAGNOSIS — R269 Unspecified abnormalities of gait and mobility: Secondary | ICD-10-CM | POA: Diagnosis not present

## 2018-06-09 DIAGNOSIS — R54 Age-related physical debility: Secondary | ICD-10-CM | POA: Diagnosis not present

## 2018-06-17 ENCOUNTER — Encounter: Payer: Self-pay | Admitting: Family Medicine

## 2018-06-20 DIAGNOSIS — R269 Unspecified abnormalities of gait and mobility: Secondary | ICD-10-CM | POA: Diagnosis not present

## 2018-06-20 DIAGNOSIS — R54 Age-related physical debility: Secondary | ICD-10-CM | POA: Diagnosis not present

## 2018-06-22 DIAGNOSIS — R269 Unspecified abnormalities of gait and mobility: Secondary | ICD-10-CM | POA: Diagnosis not present

## 2018-06-22 DIAGNOSIS — R54 Age-related physical debility: Secondary | ICD-10-CM | POA: Diagnosis not present

## 2018-06-27 DIAGNOSIS — R54 Age-related physical debility: Secondary | ICD-10-CM | POA: Diagnosis not present

## 2018-06-27 DIAGNOSIS — R269 Unspecified abnormalities of gait and mobility: Secondary | ICD-10-CM | POA: Diagnosis not present

## 2018-07-08 ENCOUNTER — Other Ambulatory Visit: Payer: Self-pay | Admitting: Family Medicine

## 2018-07-14 DIAGNOSIS — R54 Age-related physical debility: Secondary | ICD-10-CM | POA: Diagnosis not present

## 2018-07-14 DIAGNOSIS — R269 Unspecified abnormalities of gait and mobility: Secondary | ICD-10-CM | POA: Diagnosis not present

## 2018-07-22 DIAGNOSIS — R269 Unspecified abnormalities of gait and mobility: Secondary | ICD-10-CM | POA: Diagnosis not present

## 2018-07-22 DIAGNOSIS — R54 Age-related physical debility: Secondary | ICD-10-CM | POA: Diagnosis not present

## 2018-07-25 DIAGNOSIS — R269 Unspecified abnormalities of gait and mobility: Secondary | ICD-10-CM | POA: Diagnosis not present

## 2018-07-25 DIAGNOSIS — R54 Age-related physical debility: Secondary | ICD-10-CM | POA: Diagnosis not present

## 2018-07-29 DIAGNOSIS — R54 Age-related physical debility: Secondary | ICD-10-CM | POA: Diagnosis not present

## 2018-07-29 DIAGNOSIS — R269 Unspecified abnormalities of gait and mobility: Secondary | ICD-10-CM | POA: Diagnosis not present

## 2018-08-02 DIAGNOSIS — R54 Age-related physical debility: Secondary | ICD-10-CM | POA: Diagnosis not present

## 2018-08-02 DIAGNOSIS — R269 Unspecified abnormalities of gait and mobility: Secondary | ICD-10-CM | POA: Diagnosis not present

## 2018-08-05 DIAGNOSIS — R269 Unspecified abnormalities of gait and mobility: Secondary | ICD-10-CM | POA: Diagnosis not present

## 2018-08-05 DIAGNOSIS — R54 Age-related physical debility: Secondary | ICD-10-CM | POA: Diagnosis not present

## 2018-08-08 DIAGNOSIS — R54 Age-related physical debility: Secondary | ICD-10-CM | POA: Diagnosis not present

## 2018-08-08 DIAGNOSIS — R269 Unspecified abnormalities of gait and mobility: Secondary | ICD-10-CM | POA: Diagnosis not present

## 2018-09-02 DIAGNOSIS — H1851 Endothelial corneal dystrophy: Secondary | ICD-10-CM | POA: Diagnosis not present

## 2018-09-02 DIAGNOSIS — H26492 Other secondary cataract, left eye: Secondary | ICD-10-CM | POA: Diagnosis not present

## 2018-09-02 DIAGNOSIS — H527 Unspecified disorder of refraction: Secondary | ICD-10-CM | POA: Diagnosis not present

## 2018-09-02 DIAGNOSIS — H25811 Combined forms of age-related cataract, right eye: Secondary | ICD-10-CM | POA: Diagnosis not present

## 2018-09-02 DIAGNOSIS — H43813 Vitreous degeneration, bilateral: Secondary | ICD-10-CM | POA: Diagnosis not present

## 2018-09-02 DIAGNOSIS — H35371 Puckering of macula, right eye: Secondary | ICD-10-CM | POA: Diagnosis not present

## 2018-09-02 DIAGNOSIS — H35341 Macular cyst, hole, or pseudohole, right eye: Secondary | ICD-10-CM | POA: Diagnosis not present

## 2018-09-02 DIAGNOSIS — Z961 Presence of intraocular lens: Secondary | ICD-10-CM | POA: Diagnosis not present

## 2018-09-02 LAB — HM DIABETES EYE EXAM

## 2018-09-07 ENCOUNTER — Telehealth: Payer: Self-pay | Admitting: *Deleted

## 2018-09-07 NOTE — Telephone Encounter (Signed)
Received Complete Eye Exam Report from [place]; forwarded to provider/SLS 03/04

## 2018-09-19 ENCOUNTER — Encounter: Payer: Self-pay | Admitting: Family Medicine

## 2018-10-19 ENCOUNTER — Encounter: Payer: Self-pay | Admitting: Family Medicine

## 2018-10-21 NOTE — Progress Notes (Addendum)
Virtual Visit via Video Note  I connected with patient on 10/24/18 at 10:00 AM EDT by a video enabled telemedicine application and verified that I am speaking with the correct person using two identifiers.   THIS ENCOUNTER IS A VIRTUAL VISIT DUE TO COVID-19 - PATIENT WAS NOT SEEN IN THE OFFICE. PATIENT HAS CONSENTED TO VIRTUAL VISIT / TELEMEDICINE VISIT   Location of patient: home  Location of provider: office  I discussed the limitations of evaluation and management by telemedicine and the availability of in person appointments. The patient expressed understanding and agreed to proceed.   Subjective:   Felicia Lowe is a 78 y.o. female who presents for Medicare Annual (Subsequent) preventive examination.  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   Review of Systems: No ROS.  Medicare Wellness Visit. Additional risk factors are reflected in the social history. Cardiac Risk Factors include: advanced age (>32men, >36 women);hypertension;dyslipidemia Sleep patterns: varies Home Safety/Smoke Alarms: Feels safe in home. Smoke alarms in place.  Lives with husband in one story home.    Female:   Mammo- declines  Dexa scan- 11/08/17 [facility]- Cologuard 10/30/16- negative    Objective:     Vitals: Unable to assess. This visit is enabled though telemedicine due to Covid 19.  Pt took during visit: BP=137/66 HR=82.  Advanced Directives 10/24/2018 10/20/2017 10/19/2016 11/15/2014  Does Patient Have a Medical Advance Directive? No No Yes No  Does patient want to make changes to medical advance directive? - Yes (MAU/Ambulatory/Procedural Areas - Information given) Yes (MAU/Ambulatory/Procedural Areas - Information given) -  Would patient like information on creating a medical advance directive? No - Patient declined - - Yes - Scientist, clinical (histocompatibility and immunogenetics) given    Tobacco Social History   Tobacco Use  Smoking Status Never Smoker  Smokeless Tobacco Never Used     Counseling given: Not Answered    Clinical Intake: Pain : No/denies pain   Past Medical History:  Diagnosis Date  . Allergy   . Arthritis    osteoarthritis, severe, b/l knees &b/l hips  replaced  . BPV (benign positional vertigo) 02/19/2013   mild  . Chicken pox as a child  . GERD (gastroesophageal reflux disease) 05/27/2014  . H/O breast biopsy    benign, on left  . Hypertension   . Kidney stone 2000   nephritis as a child  . Measles as a child  . Mumps as a child  . Obesity   . Osteoporosis   . Preventative health care 11/25/2014  . Pruritus 01/21/2017  . Scabies   . Skin lesion of face 05/18/2012  . Thyroid disease   . Unsteady gait 11/25/2014  . UTI (urinary tract infection) 12/11/2012  . Viral respiratory illness 05/21/2013  . Vitamin D deficiency    Past Surgical History:  Procedure Laterality Date  . CATARACT EXTRACTION Left 2017  . EYE SURGERY Left 2019   cataract removal left  . HIP SURGERY     both hips  . KNEE SURGERY     both knees replaced   Family History  Problem Relation Age of Onset  . Hyperlipidemia Mother   . Hypertension Mother   . Heart attack Mother   . Cancer Father        lung- smoker  . Heart attack Father        X 2  . Thyroid disease Brother   . Hypertension Son   . Rashes / Skin problems Son   . Hernia Son  umbilical  . Cancer Paternal Grandmother   . Macular degeneration Brother    Social History   Socioeconomic History  . Marital status: Unknown    Spouse name: Not on file  . Number of children: Not on file  . Years of education: Not on file  . Highest education level: Not on file  Occupational History  . Not on file  Social Needs  . Financial resource strain: Not on file  . Food insecurity:    Worry: Not on file    Inability: Not on file  . Transportation needs:    Medical: Not on file    Non-medical: Not on file  Tobacco Use  . Smoking status: Never Smoker  . Smokeless tobacco: Never Used  Substance and Sexual Activity  . Alcohol  use: Yes    Comment: Occasional glass of wine  . Drug use: No  . Sexual activity: Never  Lifestyle  . Physical activity:    Days per week: Not on file    Minutes per session: Not on file  . Stress: Not on file  Relationships  . Social connections:    Talks on phone: Not on file    Gets together: Not on file    Attends religious service: Not on file    Active member of club or organization: Not on file    Attends meetings of clubs or organizations: Not on file    Relationship status: Not on file  Other Topics Concern  . Not on file  Social History Narrative  . Not on file    Outpatient Encounter Medications as of 10/24/2018  Medication Sig  . Ascorbic Acid (VITAMIN C) 1000 MG tablet Take 1,000 mg by mouth daily.  Marland Kitchen aspirin 81 MG tablet Take 81 mg by mouth daily.  . Cholecalciferol (VITAMIN D) 2000 UNITS tablet Take 2,000 Units by mouth daily.  . Coenzyme Q10 (CO Q 10) 100 MG CAPS Take 400 mg by mouth daily.  Marland Kitchen levothyroxine (SYNTHROID, LEVOTHROID) 125 MCG tablet Take 1 tablet (125 mcg total) by mouth daily before breakfast.  . lisinopril-hydrochlorothiazide (PRINZIDE,ZESTORETIC) 10-12.5 MG tablet TAKE 1 TABLET BY MOUTH EVERY DAY  . Omega-3 Fatty Acids (FISH OIL) 1000 MG CPDR Take 1 capsule by mouth daily.  . Probiotic Product (PROBIOTIC DAILY PO) Take by mouth.  . furosemide (LASIX) 20 MG tablet Take 1 tablet (20 mg total) by mouth daily as needed. (Patient not taking: Reported on 10/24/2018)   No facility-administered encounter medications on file as of 10/24/2018.     Activities of Daily Living In your present state of health, do you have any difficulty performing the following activities: 10/24/2018  Hearing? N  Vision? N  Difficulty concentrating or making decisions? N  Walking or climbing stairs? N  Dressing or bathing? N  Doing errands, shopping? N  Preparing Food and eating ? N  Using the Toilet? N  Do you have problems with loss of bowel control? N  Managing your  Medications? N  Managing your Finances? N  Housekeeping or managing your Housekeeping? N  Some recent data might be hidden    Patient Care Team: Mosie Lukes, MD as PCP - General (Family Medicine) Stephannie Li, [place] Tract as Consulting Physician (Ophthalmology) Valinda Party, MD as Consulting Physician (Rheumatology) Harold Hedge, Darrick Grinder, MD as Consulting Physician (Allergy and Immunology)    Assessment:   This is a routine wellness examination for Deyana. Physical assessment deferred to PCP.  Exercise Activities and Dietary recommendations  Current Exercise Habits: Home exercise routine, Type of exercise: stretching;yoga, Frequency (Times/Week): 2, Exercise limited by: None identified Diet (meal preparation, eat out, water intake, caffeinated beverages, dairy products, fruits and vegetables): well balanced, on average, 3 meals per day    Goals    . Increase physical activity       Fall Risk Fall Risk  10/20/2017 10/19/2016 03/06/2016 11/15/2014 11/10/2013  Falls in the past year? Yes No No No No  Number falls in past yr: 2 or more - - - -  Injury with Fall? No - - - -  Follow up Education provided;Falls prevention discussed - - - -    Depression Screen PHQ 2/9 Scores 10/20/2017 10/19/2016 03/06/2016 11/15/2014  PHQ - 2 Score 0 0 0 0     Cognitive Function Ad8 score reviewed for issues:  Issues making decisions:no  Less interest in hobbies / activities:no  Repeats questions, stories (family complaining):no  Trouble using ordinary gadgets (microwave, computer, phone):no  Forgets the month or year: no  Mismanaging finances: no  Remembering appts:no  Daily problems with thinking and/or memory:no Ad8 score is=0     MMSE - Mini Mental State Exam 10/19/2016  Orientation to time 5  Orientation to Place 5  Registration 3  Attention/ Calculation 5  Recall 3  Language- name 2 objects 2  Language- repeat 1  Language- follow 3 step command 3  Language- read & follow  direction 1  Write a sentence 1  Copy design 0  Total score 29        Immunization History  Administered Date(s) Administered  . Influenza, Quadrivalent, Recombinant, Inj, Pf 05/15/2016, 05/25/2017, 05/30/2018  . Influenza,trivalent, recombinat, inj, PF 05/17/2014, 05/07/2015  . Pneumococcal Conjugate-13 03/06/2016  . Pneumococcal Polysaccharide-23 07/06/2010  . Td 03/06/2016  . Tdap 07/06/2001  . Zoster 04/06/2011  . Zoster Recombinat (Shingrix) 08/23/2018    Screening Tests Health Maintenance  Topic Date Due  . INFLUENZA VACCINE  02/04/2019  . TETANUS/TDAP  03/06/2026  . DEXA SCAN  Completed  . PNA vac Low Risk Adult  Completed     Plan:   See you next year!  Continue to eat heart healthy diet (full of fruits, vegetables, whole grains, lean protein, water--limit salt, fat, and sugar intake) and increase physical activity as tolerated.  Continue doing brain stimulating activities (puzzles, reading, adult coloring books, staying active) to keep memory sharp.   Bring a copy of your living will and/or healthcare power of attorney to your next office visit.   I have personally reviewed and noted the following in the patient's chart:   . Medical and social history . Use of alcohol, tobacco or illicit drugs  . Current medications and supplements . Functional ability and status . Nutritional status . Physical activity . Advanced directives . List of other physicians . Hospitalizations, surgeries, and ER visits in previous 12 months . Vitals . Screenings to include cognitive, depression, and falls . Referrals and appointments  In addition, I have reviewed and discussed with patient certain preventive protocols, quality metrics, and best practice recommendations. A written personalized care plan for preventive services as well as general preventive health recommendations were provided to patient.     Shela Nevin, [place]  10/24/2018   Medical screening  examination/treatment was performed by qualified clinical staff member and as supervising physician I was immediately available for consultation/collaboration. I have reviewed documentation and agree with assessment and plan.  Penni Homans, MD

## 2018-10-24 ENCOUNTER — Other Ambulatory Visit: Payer: Self-pay

## 2018-10-24 ENCOUNTER — Encounter: Payer: Self-pay | Admitting: *Deleted

## 2018-10-24 ENCOUNTER — Ambulatory Visit (INDEPENDENT_AMBULATORY_CARE_PROVIDER_SITE_OTHER): Payer: Medicare HMO | Admitting: *Deleted

## 2018-10-24 DIAGNOSIS — Z0000 Encounter for general adult medical examination without abnormal findings: Secondary | ICD-10-CM

## 2018-10-24 NOTE — Patient Instructions (Signed)
See you next year!  Continue to eat heart healthy diet (full of fruits, vegetables, whole grains, lean protein, water--limit salt, fat, and sugar intake) and increase physical activity as tolerated.  Continue doing brain stimulating activities (puzzles, reading, adult coloring books, staying active) to keep memory sharp.   Bring a copy of your living will and/or healthcare power of attorney to your next office visit.   Ms. Felicia Lowe , Thank you for taking time to come for your Medicare Wellness Visit. I appreciate your ongoing commitment to your health goals. Please review the following plan we discussed and let me know if I can assist you in the future.   These are the goals we discussed: Goals    . Increase physical activity       This is a list of the screening recommended for you and due dates:  Health Maintenance  Topic Date Due  . Flu Shot  02/04/2019  . Tetanus Vaccine  03/06/2026  . DEXA scan (bone density measurement)  Completed  . Pneumonia vaccines  Completed    Health Maintenance After Age 70 After age 16, you are at a higher risk for certain long-term diseases and infections as well as injuries from falls. Falls are a major cause of broken bones and head injuries in people who are older than age 51. Getting regular preventive care can help to keep you healthy and well. Preventive care includes getting regular testing and making lifestyle changes as recommended by your health care provider. Talk with your health care provider about:  Which screenings and tests you should have. A screening is a test that checks for a disease when you have no symptoms.  A diet and exercise plan that is right for you. What should I know about screenings and tests to prevent falls? Screening and testing are the best ways to find a health problem early. Early diagnosis and treatment give you the best chance of managing medical conditions that are common after age 65. Certain conditions and  lifestyle choices may make you more likely to have a fall. Your health care provider may recommend:  Regular vision checks. Poor vision and conditions such as cataracts can make you more likely to have a fall. If you wear glasses, make sure to get your prescription updated if your vision changes.  Medicine review. Work with your health care provider to regularly review all of the medicines you are taking, including over-the-counter medicines. Ask your health care provider about any side effects that may make you more likely to have a fall. Tell your health care provider if any medicines that you take make you feel dizzy or sleepy.  Osteoporosis screening. Osteoporosis is a condition that causes the bones to get weaker. This can make the bones weak and cause them to break more easily.  Blood pressure screening. Blood pressure changes and medicines to control blood pressure can make you feel dizzy.  Strength and balance checks. Your health care provider may recommend certain tests to check your strength and balance while standing, walking, or changing positions.  Foot health exam. Foot pain and numbness, as well as not wearing proper footwear, can make you more likely to have a fall.  Depression screening. You may be more likely to have a fall if you have a fear of falling, feel emotionally low, or feel unable to do activities that you used to do.  Alcohol use screening. Using too much alcohol can affect your balance and may make you more  likely to have a fall. What actions can I take to lower my risk of falls? General instructions  Talk with your health care provider about your risks for falling. Tell your health care provider if: ? You fall. Be sure to tell your health care provider about all falls, even ones that seem minor. ? You feel dizzy, sleepy, or off-balance.  Take over-the-counter and prescription medicines only as told by your health care provider. These include any supplements.   Eat a healthy diet and maintain a healthy weight. A healthy diet includes low-fat dairy products, low-fat (lean) meats, and fiber from whole grains, beans, and lots of fruits and vegetables. Home safety  Remove any tripping hazards, such as rugs, cords, and clutter.  Install safety equipment such as grab bars in bathrooms and safety rails on stairs.  Keep rooms and walkways well-lit. Activity   Follow a regular exercise program to stay fit. This will help you maintain your balance. Ask your health care provider what types of exercise are appropriate for you.  If you need a cane or walker, use it as recommended by your health care provider.  Wear supportive shoes that have nonskid soles. Lifestyle  Do not drink alcohol if your health care provider tells you not to drink.  If you drink alcohol, limit how much you have: ? 0-1 drink a day for women. ? 0-2 drinks a day for men.  Be aware of how much alcohol is in your drink. In the [place] one drink equals one typical bottle of beer (12 oz), one-half glass of wine (5 oz), or one shot of hard liquor (1 oz).  Do not use any products that contain nicotine or tobacco, such as cigarettes and e-cigarettes. If you need help quitting, ask your health care provider. Summary  Having a healthy lifestyle and getting preventive care can help to protect your health and wellness after age 55.  Screening and testing are the best way to find a health problem early and help you avoid having a fall. Early diagnosis and treatment give you the best chance for managing medical conditions that are more common for people who are older than age 50.  Falls are a major cause of broken bones and head injuries in people who are older than age 54. Take precautions to prevent a fall at home.  Work with your health care provider to learn what changes you can make to improve your health and wellness and to prevent falls. This information is not intended to replace  advice given to you by your health care provider. Make sure you discuss any questions you have with your health care provider. Document Released: 05/05/2017 Document Revised: 05/05/2017 Document Reviewed: 05/05/2017 Elsevier Interactive Patient Education  2019 [employer].

## 2018-11-08 DIAGNOSIS — H25811 Combined forms of age-related cataract, right eye: Secondary | ICD-10-CM | POA: Diagnosis not present

## 2018-11-14 DIAGNOSIS — Z01812 Encounter for preprocedural laboratory examination: Secondary | ICD-10-CM | POA: Diagnosis not present

## 2018-11-14 DIAGNOSIS — H25811 Combined forms of age-related cataract, right eye: Secondary | ICD-10-CM | POA: Diagnosis not present

## 2018-11-14 DIAGNOSIS — Z1159 Encounter for screening for other viral diseases: Secondary | ICD-10-CM | POA: Diagnosis not present

## 2018-11-17 DIAGNOSIS — Z9842 Cataract extraction status, left eye: Secondary | ICD-10-CM | POA: Diagnosis not present

## 2018-11-17 DIAGNOSIS — Z83511 Family history of glaucoma: Secondary | ICD-10-CM | POA: Diagnosis not present

## 2018-11-17 DIAGNOSIS — Z7982 Long term (current) use of aspirin: Secondary | ICD-10-CM | POA: Diagnosis not present

## 2018-11-17 DIAGNOSIS — H269 Unspecified cataract: Secondary | ICD-10-CM | POA: Diagnosis not present

## 2018-11-17 DIAGNOSIS — H25811 Combined forms of age-related cataract, right eye: Secondary | ICD-10-CM | POA: Diagnosis not present

## 2018-11-17 DIAGNOSIS — Z79899 Other long term (current) drug therapy: Secondary | ICD-10-CM | POA: Diagnosis not present

## 2018-11-17 DIAGNOSIS — H527 Unspecified disorder of refraction: Secondary | ICD-10-CM | POA: Diagnosis not present

## 2018-11-17 DIAGNOSIS — H43813 Vitreous degeneration, bilateral: Secondary | ICD-10-CM | POA: Diagnosis not present

## 2018-11-17 DIAGNOSIS — Z961 Presence of intraocular lens: Secondary | ICD-10-CM | POA: Diagnosis not present

## 2018-11-17 DIAGNOSIS — H1851 Endothelial corneal dystrophy: Secondary | ICD-10-CM | POA: Diagnosis not present

## 2018-11-17 DIAGNOSIS — H35341 Macular cyst, hole, or pseudohole, right eye: Secondary | ICD-10-CM | POA: Diagnosis not present

## 2018-11-18 DIAGNOSIS — H35341 Macular cyst, hole, or pseudohole, right eye: Secondary | ICD-10-CM | POA: Diagnosis not present

## 2018-11-18 DIAGNOSIS — H1851 Endothelial corneal dystrophy: Secondary | ICD-10-CM | POA: Diagnosis not present

## 2018-11-18 DIAGNOSIS — H26492 Other secondary cataract, left eye: Secondary | ICD-10-CM | POA: Diagnosis not present

## 2018-11-18 DIAGNOSIS — H527 Unspecified disorder of refraction: Secondary | ICD-10-CM | POA: Diagnosis not present

## 2018-11-18 DIAGNOSIS — H43813 Vitreous degeneration, bilateral: Secondary | ICD-10-CM | POA: Diagnosis not present

## 2018-11-18 DIAGNOSIS — Z961 Presence of intraocular lens: Secondary | ICD-10-CM | POA: Diagnosis not present

## 2018-11-18 DIAGNOSIS — H35371 Puckering of macula, right eye: Secondary | ICD-10-CM | POA: Diagnosis not present

## 2018-12-01 ENCOUNTER — Encounter: Payer: Self-pay | Admitting: Family Medicine

## 2018-12-01 ENCOUNTER — Other Ambulatory Visit: Payer: Self-pay

## 2018-12-01 ENCOUNTER — Ambulatory Visit: Payer: Medicare HMO | Admitting: Family Medicine

## 2018-12-30 ENCOUNTER — Encounter: Payer: Self-pay | Admitting: Family Medicine

## 2018-12-30 ENCOUNTER — Telehealth (INDEPENDENT_AMBULATORY_CARE_PROVIDER_SITE_OTHER): Payer: Medicare HMO | Admitting: Family

## 2018-12-30 DIAGNOSIS — N3000 Acute cystitis without hematuria: Secondary | ICD-10-CM

## 2018-12-30 NOTE — Telephone Encounter (Signed)
Spoke with pt and scheduled a mychart video visit today with NP, Inda Castle.

## 2018-12-30 NOTE — Progress Notes (Signed)
Virtual Visit via Video Note  I connected with [name] on 12/30/18 at 11:20 AM EDT by a video enabled telemedicine application and verified that I am speaking with the correct person using two identifiers.  Location: Patient: home  Provider: office   I discussed the limitations of evaluation and management by telemedicine and the availability of in person appointments. The patient expressed understanding and agreed to proceed.  History of Present Illness:  Patient is a 78 yr old female who presents today with chief complaint of urinary frequency.    Reports that last night she developed chills, felt weak, and had a few dizzy spells.  Reports that she developed urinary frequency. Occasional dysuria.  She denies associated fever.    Past Medical History:  Diagnosis Date  . Allergy   . Arthritis    osteoarthritis, severe, b/l knees &b/l hips  replaced  . BPV (benign positional vertigo) 02/19/2013   mild  . Chicken pox as a child  . GERD (gastroesophageal reflux disease) 05/27/2014  . H/O breast biopsy    benign, on left  . Hypertension   . Kidney stone 2000   nephritis as a child  . Measles as a child  . Mumps as a child  . Obesity   . Osteoporosis   . Preventative health care 11/25/2014  . Pruritus 01/21/2017  . Scabies   . Skin lesion of face 05/18/2012  . Thyroid disease   . Unsteady gait 11/25/2014  . UTI (urinary tract infection) 12/11/2012  . Viral respiratory illness 05/21/2013  . Vitamin D deficiency      Social History   Socioeconomic History  . Marital status: Unknown    Spouse name: Not on file  . Number of children: Not on file  . Years of education: Not on file  . Highest education level: Not on file  Occupational History  . Not on file  Social Needs  . Financial resource strain: Not on file  . Food insecurity    Worry: Not on file    Inability: Not on file  . Transportation needs    Medical: Not on file    Non-medical: Not on file  Tobacco Use   . Smoking status: Never Smoker  . Smokeless tobacco: Never Used  Substance and Sexual Activity  . Alcohol use: Yes    Comment: Occasional glass of wine  . Drug use: No  . Sexual activity: Never  Lifestyle  . Physical activity    Days per week: Not on file    Minutes per session: Not on file  . Stress: Not on file  Relationships  . Social Herbalist on phone: Not on file    Gets together: Not on file    Attends religious service: Not on file    Active member of club or organization: Not on file    Attends meetings of clubs or organizations: Not on file    Relationship status: Not on file  . Intimate partner violence    Fear of current or ex partner: Not on file    Emotionally abused: Not on file    Physically abused: Not on file    Forced sexual activity: Not on file  Other Topics Concern  . Not on file  Social History Narrative  . Not on file    Past Surgical History:  Procedure Laterality Date  . CATARACT EXTRACTION Left 2017  . EYE SURGERY Left 2019   cataract removal left  .  HIP SURGERY     both hips  . KNEE SURGERY     both knees replaced    Family History  Problem Relation Age of Onset  . Hyperlipidemia Mother   . Hypertension Mother   . Heart attack Mother   . Cancer Father        lung- smoker  . Heart attack Father        X 2  . Thyroid disease Brother   . Hypertension Son   . Rashes / Skin problems Son   . Hernia Son        umbilical  . Cancer Paternal Grandmother   . Macular degeneration Brother     Allergies  Allergen Reactions  . Eggs Or Egg-Derived Products Shortness Of Breath and Rash  . Sulfa Antibiotics Nausea And Vomiting  . Keflex [Cephalexin] Rash    Rash   . Penicillins Rash  . Poison Ivy Extract [Poison Ivy Extract] Rash    Current Outpatient Medications on File Prior to Visit  Medication Sig Dispense Refill  . Ascorbic Acid (VITAMIN C) 1000 MG tablet Take 1,000 mg by mouth daily.    Marland Kitchen aspirin 81 MG tablet Take  81 mg by mouth daily.    . Cholecalciferol (VITAMIN D) 2000 UNITS tablet Take 2,000 Units by mouth daily.    . Coenzyme Q10 (CO Q 10) 100 MG CAPS Take 400 mg by mouth daily.    . furosemide (LASIX) 20 MG tablet Take 1 tablet (20 mg total) by mouth daily as needed. 30 tablet 4  . levothyroxine (SYNTHROID, LEVOTHROID) 125 MCG tablet Take 1 tablet (125 mcg total) by mouth daily before breakfast. 90 tablet 0  . lisinopril-hydrochlorothiazide (PRINZIDE,ZESTORETIC) 10-12.5 MG tablet TAKE 1 TABLET BY MOUTH EVERY DAY 90 tablet 1  . Omega-3 Fatty Acids (FISH OIL) 1000 MG CPDR Take 1 capsule by mouth daily.    . Probiotic Product (PROBIOTIC DAILY PO) Take by mouth.     No current facility-administered medications on file prior to visit.     There were no vitals taken for this visit.  126/79 pulse 79   Observations/Objective:   Gen: Awake, alert, no acute distress Resp: Breathing is even and non-labored Psych: calm/pleasant demeanor Neuro: Alert and Oriented x 3, + facial symmetry, speech is clear.   Assessment and Plan:  UTI- symptoms most consistent with UTI.  Pt is advised to call if symptoms worsen or if symptoms fail to improve.   Follow Up Instructions:    I discussed the assessment and treatment plan with the patient. The patient was provided an opportunity to ask questions and all were answered. The patient agreed with the plan and demonstrated an understanding of the instructions.   The patient was advised to call back or seek an in-person evaluation if the symptoms worsen or if the condition fails to improve as anticipated.  Nance Pear, NP

## 2018-12-31 ENCOUNTER — Other Ambulatory Visit: Payer: Self-pay | Admitting: Family Medicine

## 2019-01-01 ENCOUNTER — Telehealth: Payer: Self-pay | Admitting: Family

## 2019-01-01 MED ORDER — CIPROFLOXACIN HCL 250 MG PO TABS: 250.0000 mg | ORAL_TABLET | Freq: Two times a day (BID) | ORAL | 0 refills | Status: DC

## 2019-01-01 NOTE — Telephone Encounter (Signed)
Reviewed chart and discovered that Cipro rx had been pended but not sent. Rx sent to pharmacy.  Left message on pt answering machine re: above.

## 2019-01-12 DIAGNOSIS — M545 Low back pain: Secondary | ICD-10-CM | POA: Diagnosis not present

## 2019-01-12 DIAGNOSIS — M533 Sacrococcygeal disorders, not elsewhere classified: Secondary | ICD-10-CM | POA: Diagnosis not present

## 2019-01-12 DIAGNOSIS — M25551 Pain in right hip: Secondary | ICD-10-CM | POA: Diagnosis not present

## 2019-01-13 ENCOUNTER — Encounter: Payer: Self-pay | Admitting: Family Medicine

## 2019-01-17 ENCOUNTER — Other Ambulatory Visit: Payer: Self-pay | Admitting: Orthopedic Surgery

## 2019-01-17 NOTE — Telephone Encounter (Signed)
Kirsten can you call patient and set up virtual visit with PCP

## 2019-01-17 NOTE — Telephone Encounter (Signed)
Patient inquired about why we needed a virtual visit. Appointment scheduled for September 29th per patient's request for a follow up and a flu shot.

## 2019-01-27 DIAGNOSIS — M545 Low back pain: Secondary | ICD-10-CM | POA: Diagnosis not present

## 2019-02-13 DIAGNOSIS — M47816 Spondylosis without myelopathy or radiculopathy, lumbar region: Secondary | ICD-10-CM | POA: Diagnosis not present

## 2019-02-13 DIAGNOSIS — M5136 Other intervertebral disc degeneration, lumbar region: Secondary | ICD-10-CM | POA: Diagnosis not present

## 2019-02-13 DIAGNOSIS — T148XXA Other injury of unspecified body region, initial encounter: Secondary | ICD-10-CM | POA: Diagnosis not present

## 2019-02-13 DIAGNOSIS — M47817 Spondylosis without myelopathy or radiculopathy, lumbosacral region: Secondary | ICD-10-CM | POA: Diagnosis not present

## 2019-02-13 DIAGNOSIS — M899 Disorder of bone, unspecified: Secondary | ICD-10-CM | POA: Diagnosis not present

## 2019-02-16 DIAGNOSIS — M545 Low back pain: Secondary | ICD-10-CM | POA: Diagnosis not present

## 2019-02-16 DIAGNOSIS — M47816 Spondylosis without myelopathy or radiculopathy, lumbar region: Secondary | ICD-10-CM | POA: Diagnosis not present

## 2019-02-21 DIAGNOSIS — M545 Low back pain: Secondary | ICD-10-CM | POA: Diagnosis not present

## 2019-02-21 DIAGNOSIS — M25551 Pain in right hip: Secondary | ICD-10-CM | POA: Diagnosis not present

## 2019-02-22 DIAGNOSIS — Z8739 Personal history of other diseases of the musculoskeletal system and connective tissue: Secondary | ICD-10-CM | POA: Diagnosis not present

## 2019-02-22 DIAGNOSIS — M549 Dorsalgia, unspecified: Secondary | ICD-10-CM | POA: Diagnosis not present

## 2019-02-22 DIAGNOSIS — M179 Osteoarthritis of knee, unspecified: Secondary | ICD-10-CM | POA: Diagnosis not present

## 2019-02-22 DIAGNOSIS — M542 Cervicalgia: Secondary | ICD-10-CM | POA: Diagnosis not present

## 2019-03-31 ENCOUNTER — Encounter: Payer: Self-pay | Admitting: Family Medicine

## 2019-04-03 ENCOUNTER — Other Ambulatory Visit: Payer: Self-pay

## 2019-04-04 ENCOUNTER — Ambulatory Visit: Payer: Medicare HMO | Admitting: Family Medicine

## 2019-04-04 ENCOUNTER — Encounter: Payer: Self-pay | Admitting: Family Medicine

## 2019-04-04 VITALS — BP 122/68 | HR 87 | Temp 97.7°F | Resp 18 | Wt 180.0 lb

## 2019-04-04 DIAGNOSIS — E559 Vitamin D deficiency, unspecified: Secondary | ICD-10-CM | POA: Diagnosis not present

## 2019-04-04 DIAGNOSIS — E785 Hyperlipidemia, unspecified: Secondary | ICD-10-CM

## 2019-04-04 DIAGNOSIS — M8580 Other specified disorders of bone density and structure, unspecified site: Secondary | ICD-10-CM | POA: Diagnosis not present

## 2019-04-04 DIAGNOSIS — M545 Low back pain, unspecified: Secondary | ICD-10-CM | POA: Insufficient documentation

## 2019-04-04 DIAGNOSIS — E6609 Other obesity due to excess calories: Secondary | ICD-10-CM

## 2019-04-04 DIAGNOSIS — R739 Hyperglycemia, unspecified: Secondary | ICD-10-CM | POA: Diagnosis not present

## 2019-04-04 DIAGNOSIS — I10 Essential (primary) hypertension: Secondary | ICD-10-CM

## 2019-04-04 LAB — CBC
HCT: 43 % (ref 36.0–46.0)
Hemoglobin: 14.3 g/dL (ref 12.0–15.0)
MCHC: 33.3 g/dL (ref 30.0–36.0)
MCV: 90.9 fl (ref 78.0–100.0)
Platelets: 298 10*3/uL (ref 150.0–400.0)
RBC: 4.74 Mil/uL (ref 3.87–5.11)
RDW: 13.9 % (ref 11.5–15.5)
WBC: 7 10*3/uL (ref 4.0–10.5)

## 2019-04-04 LAB — HEMOGLOBIN A1C: Hgb A1c MFr Bld: 6 % (ref 4.6–6.5)

## 2019-04-04 LAB — COMPREHENSIVE METABOLIC PANEL
ALT: 10 U/L (ref 0–35)
AST: 10 U/L (ref 0–37)
Albumin: 4.1 g/dL (ref 3.5–5.2)
Alkaline Phosphatase: 60 U/L (ref 39–117)
BUN: 18 mg/dL (ref 6–23)
CO2: 28 mEq/L (ref 19–32)
Calcium: 9.7 mg/dL (ref 8.4–10.5)
Chloride: 103 mEq/L (ref 96–112)
Creatinine, Ser: 0.82 mg/dL (ref 0.40–1.20)
GFR: 67.4 mL/min (ref >60.00)
Glucose, Bld: 83 mg/dL (ref 70–99)
Potassium: 4.4 mEq/L (ref 3.5–5.1)
Sodium: 140 mEq/L (ref 135–145)
Total Bilirubin: 0.3 mg/dL (ref 0.2–1.2)
Total Protein: 6.7 g/dL (ref 6.0–8.3)

## 2019-04-04 LAB — LIPID PANEL
Cholesterol: 147 mg/dL (ref 0–200)
HDL: 50.1 mg/dL (ref >39.00)
LDL Cholesterol: 71 mg/dL (ref 0–99)
NonHDL: 97.21
Total CHOL/HDL Ratio: 3
Triglycerides: 130 mg/dL (ref 0.0–149.0)
VLDL: 26 mg/dL (ref 0.0–40.0)

## 2019-04-04 LAB — VITAMIN D 25 HYDROXY (VIT D DEFICIENCY, FRACTURES): VITD: 47.99 ng/mL (ref 30.00–100.00)

## 2019-04-04 LAB — TSH: TSH: 0.1 u[IU]/mL — ABNORMAL LOW (ref 0.35–4.50)

## 2019-04-04 NOTE — Assessment & Plan Note (Signed)
Encouraged to get adequate exercise, calcium and vitamin d intake 

## 2019-04-04 NOTE — Assessment & Plan Note (Addendum)
August 2020 MRI  IMPRESSION:  1. No traumatic or infectious findings of L4-5 disc/vertebral bodies to account for bone scan findings. There is severe degenerative disc disease at L4-5 which may account for this process. No compression fractures.  2. Focal indeterminate bone lesion L3 vertebral body. Considerations include metastatic lesion or atypical hemangioma. No definite focal uptake in this location on recent bone scan. CT lumbar spine without contrast could be considered to assess for  potential typical CT features of hemangioma which could preclude need for surveillance imaging. Alternatively, short-term follow-up lumbar spine MRI to include postcontrast imaging recommended in 3 months.   3. Severe diffuse degenerative disc disease/spondylosis and multilevel lumbar facet arthropathy. There is mild central stenosis and mild bilateral foraminal narrowing at multiple levels without high-grade spinal stenosis.  4. Incidental findings as discussed.  Is working with rheumatology and ortho at [facility], Dr Lurene Shadow.   She notes her massage is helping her the most.   Has done some PT as well and is doing a bit better at present

## 2019-04-04 NOTE — Assessment & Plan Note (Signed)
Well controlled, no changes to meds. Encouraged heart healthy diet such as the DASH diet and exercise as tolerated.  °

## 2019-04-04 NOTE — Assessment & Plan Note (Signed)
Supplement and monitor 

## 2019-04-04 NOTE — Patient Instructions (Signed)

## 2019-04-04 NOTE — Progress Notes (Signed)
Subjective:    Patient ID: Felicia Lowe, female    DOB: 1941/06/09, 78 y.o.   MRN: [id number]  No chief complaint on file.   HPI Patient is in today for follow up on chronic medical concerns including vitamin D deficiency, hypertension and obesity. She has been working hard at weight loss by eating better with smaller portion sizes and hi quality meals. She continues to stay active. She is maintaining quarantine well with her husband and family. Denies CP/palp/SOB/HA/congestion/fevers/GI or GU c/o. Taking meds as prescribed. She has been struggling with increased low back pain. No trauma or fall. She has followed with ortho, PT and rheumatology. Rheumatology does not think the pain is a flare in RA. PT was too painful. She notes massage therapy has been the most helpful and she is feeling better at present.  Past Medical History:  Diagnosis Date  . Allergy   . Arthritis    osteoarthritis, severe, b/l knees &b/l hips  replaced  . BPV (benign positional vertigo) 02/19/2013   mild  . Chicken pox as a child  . GERD (gastroesophageal reflux disease) 05/27/2014  . H/O breast biopsy    benign, on left  . Hypertension   . Kidney stone 2000   nephritis as a child  . Measles as a child  . Mumps as a child  . Obesity   . Osteoporosis   . Preventative health care 11/25/2014  . Pruritus 01/21/2017  . Scabies   . Skin lesion of face 05/18/2012  . Thyroid disease   . Unsteady gait 11/25/2014  . UTI (urinary tract infection) 12/11/2012  . Viral respiratory illness 05/21/2013  . Vitamin D deficiency     Past Surgical History:  Procedure Laterality Date  . CATARACT EXTRACTION Left 2017  . EYE SURGERY Left 2019   cataract removal left  . HIP SURGERY     both hips  . KNEE SURGERY     both knees replaced    Family History  Problem Relation Age of Onset  . Hyperlipidemia Mother   . Hypertension Mother   . Heart attack Mother   . Cancer Father        lung- smoker  . Heart attack Father         X 2  . Thyroid disease Brother   . Hypertension Son   . Rashes / Skin problems Son   . Hernia Son        umbilical  . Cancer Paternal Grandmother   . Macular degeneration Brother     Social History   Socioeconomic History  . Marital status: Unknown    Spouse name: Not on file  . Number of children: Not on file  . Years of education: Not on file  . Highest education level: Not on file  Occupational History  . Not on file  Social Needs  . Financial resource strain: Not on file  . Food insecurity    Worry: Not on file    Inability: Not on file  . Transportation needs    Medical: Not on file    Non-medical: Not on file  Tobacco Use  . Smoking status: Never Smoker  . Smokeless tobacco: Never Used  Substance and Sexual Activity  . Alcohol use: Yes    Comment: Occasional glass of wine  . Drug use: No  . Sexual activity: Never  Lifestyle  . Physical activity    Days per week: Not on file    Minutes per session: Not  on file  . Stress: Not on file  Relationships  . Social Herbalist on phone: Not on file    Gets together: Not on file    Attends religious service: Not on file    Active member of club or organization: Not on file    Attends meetings of clubs or organizations: Not on file    Relationship status: Not on file  . Intimate partner violence    Fear of current or ex partner: Not on file    Emotionally abused: Not on file    Physically abused: Not on file    Forced sexual activity: Not on file  Other Topics Concern  . Not on file  Social History Narrative  . Not on file    Outpatient Medications Prior to Visit  Medication Sig Dispense Refill  . Ascorbic Acid (VITAMIN C) 1000 MG tablet Take 1,000 mg by mouth daily.    Marland Kitchen aspirin 81 MG tablet Take 81 mg by mouth daily.    . Cholecalciferol (VITAMIN D) 2000 UNITS tablet Take 2,000 Units by mouth daily.    . Coenzyme Q10 (CO Q 10) 100 MG CAPS Take 400 mg by mouth daily.    . furosemide  (LASIX) 20 MG tablet Take 1 tablet (20 mg total) by mouth daily as needed. 30 tablet 4  . levothyroxine (SYNTHROID, LEVOTHROID) 125 MCG tablet Take 1 tablet (125 mcg total) by mouth daily before breakfast. 90 tablet 0  . lisinopril-hydrochlorothiazide (ZESTORETIC) 10-12.5 MG tablet TAKE 1 TABLET BY MOUTH EVERY DAY 90 tablet 1  . Omega-3 Fatty Acids (FISH OIL) 1000 MG CPDR Take 1 capsule by mouth daily.    . Probiotic Product (PROBIOTIC DAILY PO) Take by mouth.    . ciprofloxacin (CIPRO) 250 MG tablet Take 1 tablet (250 mg total) by mouth 2 (two) times daily. 10 tablet 0   No facility-administered medications prior to visit.     Allergies  Allergen Reactions  . Eggs Or Egg-Derived Products Shortness Of Breath and Rash  . Sulfa Antibiotics Nausea And Vomiting  . Keflex [Cephalexin] Rash    Rash   . Penicillins Rash  . Poison Ivy Extract [Poison Ivy Extract] Rash    Review of Systems  Constitutional: Negative for fever and malaise/fatigue.  HENT: Negative for congestion.   Eyes: Negative for blurred vision.  Respiratory: Negative for shortness of breath.   Cardiovascular: Negative for chest pain, palpitations and leg swelling.  Gastrointestinal: Negative for abdominal pain, blood in stool and nausea.  Genitourinary: Negative for dysuria and frequency.  Musculoskeletal: Positive for back pain. Negative for falls.  Skin: Negative for rash.  Neurological: Negative for dizziness, loss of consciousness and headaches.  Endo/Heme/Allergies: Negative for environmental allergies.  Psychiatric/Behavioral: Negative for depression. The patient is not nervous/anxious.        Objective:    Physical Exam Vitals signs and nursing note reviewed.  Constitutional:      General: She is not in acute distress.    Appearance: She is well-developed.  HENT:     Head: Normocephalic and atraumatic.     Nose: Nose normal.  Eyes:     General:        Right eye: No discharge.        Left eye: No  discharge.  Neck:     Musculoskeletal: Normal range of motion and neck supple.  Cardiovascular:     Rate and Rhythm: Normal rate and regular rhythm.  Heart sounds: No murmur.  Pulmonary:     Effort: Pulmonary effort is normal.     Breath sounds: Normal breath sounds.  Abdominal:     General: Bowel sounds are normal.     Palpations: Abdomen is soft.     Tenderness: There is no abdominal tenderness.  Skin:    General: Skin is warm and dry.  Neurological:     Mental Status: She is alert and oriented to person, place, and time.     BP 122/68 (BP Location: Left Arm, Patient Position: Sitting, Cuff Size: Normal)   Pulse 87   Temp 97.7 F (36.5 C) (Temporal)   Resp 18   Wt 180 lb (81.6 kg)   SpO2 97%   BMI 32.92 kg/m  Wt Readings from Last 3 Encounters:  04/04/19 180 lb (81.6 kg)  05/30/18 182 lb (82.6 kg)  01/21/18 183 lb (83 kg)    Diabetic Foot Exam - Simple   No data filed     Lab Results  Component Value Date   WBC 5.8 05/30/2018   HGB 14.4 05/30/2018   HCT 43.3 05/30/2018   PLT 274.0 05/30/2018   GLUCOSE 92 05/30/2018   CHOL 142 05/30/2018   TRIG 157.0 (H) 05/30/2018   HDL 43.60 05/30/2018   LDLCALC 67 05/30/2018   ALT 9 05/30/2018   AST 12 05/30/2018   NA 141 05/30/2018   K 3.9 05/30/2018   CL 104 05/30/2018   CREATININE 0.69 05/30/2018   BUN 13 05/30/2018   CO2 27 05/30/2018   TSH 0.26 (L) 05/30/2018   HGBA1C 5.8 05/30/2018    Lab Results  Component Value Date   TSH 0.26 (L) 05/30/2018   Lab Results  Component Value Date   WBC 5.8 05/30/2018   HGB 14.4 05/30/2018   HCT 43.3 05/30/2018   MCV 90.2 05/30/2018   PLT 274.0 05/30/2018   Lab Results  Component Value Date   NA 141 05/30/2018   K 3.9 05/30/2018   CO2 27 05/30/2018   GLUCOSE 92 05/30/2018   BUN 13 05/30/2018   CREATININE 0.69 05/30/2018   BILITOT 0.5 05/30/2018   ALKPHOS 63 05/30/2018   AST 12 05/30/2018   ALT 9 05/30/2018   PROT 6.5 05/30/2018   ALBUMIN 4.1 05/30/2018    CALCIUM 9.5 05/30/2018   GFR 87.62 05/30/2018   Lab Results  Component Value Date   CHOL 142 05/30/2018   Lab Results  Component Value Date   HDL 43.60 05/30/2018   Lab Results  Component Value Date   LDLCALC 67 05/30/2018   Lab Results  Component Value Date   TRIG 157.0 (H) 05/30/2018   Lab Results  Component Value Date   CHOLHDL 3 05/30/2018   Lab Results  Component Value Date   HGBA1C 5.8 05/30/2018       Assessment & Plan:   Problem List Items Addressed This Visit    Essential hypertension    Well controlled, no changes to meds. Encouraged heart healthy diet such as the DASH diet and exercise as tolerated.       Relevant Orders   CBC   Comprehensive metabolic panel   TSH   Vitamin D deficiency - Primary    Supplement and monitor      Relevant Orders   VITAMIN D 25 Hydroxy (Vit-D Deficiency, Fractures)   Obesity    Has lost about 10# over past year. Is watching portion sizes and tries to stay as active possible  Hyperglycemia    hgba1c acceptable, minimize simple carbs. Increase exercise as tolerated.       Relevant Orders   Hemoglobin A1c   Hyperlipidemia    Encouraged heart healthy diet, increase exercise, avoid trans fats, consider a krill oil cap daily      Relevant Orders   Lipid panel   Osteopenia    Encouraged to get adequate exercise, calcium and vitamin d intake      Low back pain    August 2020 MRI  IMPRESSION:  1. No traumatic or infectious findings of L4-5 disc/vertebral bodies to account for bone scan findings. There is severe degenerative disc disease at L4-5 which may account for this process. No compression fractures.  2. Focal indeterminate bone lesion L3 vertebral body. Considerations include metastatic lesion or atypical hemangioma. No definite focal uptake in this location on recent bone scan. CT lumbar spine without contrast could be considered to assess for  potential typical CT features of hemangioma which could  preclude need for surveillance imaging. Alternatively, short-term follow-up lumbar spine MRI to include postcontrast imaging recommended in 3 months.   3. Severe diffuse degenerative disc disease/spondylosis and multilevel lumbar facet arthropathy. There is mild central stenosis and mild bilateral foraminal narrowing at multiple levels without high-grade spinal stenosis.  4. Incidental findings as discussed.  Is working with rheumatology and ortho at [facility], Dr Lurene Shadow.   She notes her massage is helping her the most.   Has done some PT as well and is doing a bit better at present         I have discontinued [name] "Susan"'s ciprofloxacin. I am also having her maintain her vitamin C, Vitamin D, Co Q 10, Fish Oil, aspirin, Probiotic Product (PROBIOTIC DAILY PO), furosemide, levothyroxine, and lisinopril-hydrochlorothiazide.  No orders of the defined types were placed in this encounter.    Penni Homans, MD

## 2019-04-04 NOTE — Assessment & Plan Note (Signed)
Encouraged heart healthy diet, increase exercise, avoid trans fats, consider a krill oil cap daily 

## 2019-04-04 NOTE — Assessment & Plan Note (Signed)
hgba1c acceptable, minimize simple carbs. Increase exercise as tolerated.  

## 2019-04-04 NOTE — Assessment & Plan Note (Signed)
Has lost about 10# over past year. Is watching portion sizes and tries to stay as active possible

## 2019-04-07 ENCOUNTER — Ambulatory Visit (INDEPENDENT_AMBULATORY_CARE_PROVIDER_SITE_OTHER): Payer: Medicare HMO

## 2019-04-07 ENCOUNTER — Other Ambulatory Visit: Payer: Self-pay

## 2019-04-07 DIAGNOSIS — Z23 Encounter for immunization: Secondary | ICD-10-CM | POA: Diagnosis not present

## 2019-04-28 ENCOUNTER — Encounter: Payer: Self-pay | Admitting: Family Medicine

## 2019-06-20 ENCOUNTER — Other Ambulatory Visit: Payer: Self-pay | Admitting: Family Medicine

## 2019-06-20 ENCOUNTER — Telehealth: Payer: Self-pay | Admitting: Family Medicine

## 2019-06-20 NOTE — Telephone Encounter (Signed)
Pt dropped off document to be filled out by provider ([facility] renewal -1 page with an envelope included) Pt wants document to be mailed when ready. Document put at front office tray under provider's name.

## 2019-06-26 ENCOUNTER — Other Ambulatory Visit: Payer: Self-pay | Admitting: Family Medicine

## 2019-06-27 NOTE — Telephone Encounter (Signed)
Forms forwarded to pcp  PCP not in the office this week

## 2019-06-29 ENCOUNTER — Encounter: Payer: Self-pay | Admitting: Family Medicine

## 2019-06-29 ENCOUNTER — Emergency Department (HOSPITAL_BASED_OUTPATIENT_CLINIC_OR_DEPARTMENT_OTHER): Payer: Medicare HMO

## 2019-06-29 ENCOUNTER — Emergency Department (HOSPITAL_BASED_OUTPATIENT_CLINIC_OR_DEPARTMENT_OTHER)
Admission: EM | Admit: 2019-06-29 | Discharge: 2019-06-29 | Disposition: A | Discharge status: Home / Self Care | Payer: Medicare HMO | Attending: Emergency Medicine | Admitting: Emergency Medicine

## 2019-06-29 ENCOUNTER — Other Ambulatory Visit: Payer: Self-pay

## 2019-06-29 ENCOUNTER — Encounter (HOSPITAL_BASED_OUTPATIENT_CLINIC_OR_DEPARTMENT_OTHER): Payer: Self-pay | Admitting: Emergency Medicine

## 2019-06-29 ENCOUNTER — Ambulatory Visit (INDEPENDENT_AMBULATORY_CARE_PROVIDER_SITE_OTHER): Payer: Medicare HMO | Admitting: Family Medicine

## 2019-06-29 DIAGNOSIS — Z7982 Long term (current) use of aspirin: Secondary | ICD-10-CM | POA: Diagnosis not present

## 2019-06-29 DIAGNOSIS — R509 Fever, unspecified: Secondary | ICD-10-CM | POA: Diagnosis not present

## 2019-06-29 DIAGNOSIS — I10 Essential (primary) hypertension: Secondary | ICD-10-CM | POA: Diagnosis not present

## 2019-06-29 DIAGNOSIS — R350 Frequency of micturition: Secondary | ICD-10-CM | POA: Diagnosis present

## 2019-06-29 DIAGNOSIS — N3000 Acute cystitis without hematuria: Secondary | ICD-10-CM | POA: Diagnosis not present

## 2019-06-29 DIAGNOSIS — N200 Calculus of kidney: Secondary | ICD-10-CM | POA: Diagnosis not present

## 2019-06-29 DIAGNOSIS — R6883 Chills (without fever): Secondary | ICD-10-CM | POA: Diagnosis not present

## 2019-06-29 DIAGNOSIS — Z79899 Other long term (current) drug therapy: Secondary | ICD-10-CM | POA: Insufficient documentation

## 2019-06-29 DIAGNOSIS — A419 Sepsis, unspecified organism: Secondary | ICD-10-CM | POA: Diagnosis not present

## 2019-06-29 DIAGNOSIS — R000 Tachycardia, unspecified: Secondary | ICD-10-CM | POA: Diagnosis not present

## 2019-06-29 DIAGNOSIS — K8020 Calculus of gallbladder without cholecystitis without obstruction: Secondary | ICD-10-CM | POA: Diagnosis not present

## 2019-06-29 LAB — COMPREHENSIVE METABOLIC PANEL
ALT: 24 U/L (ref 0–44)
AST: 30 U/L (ref 15–41)
Albumin: 3.8 g/dL (ref 3.5–5.0)
Alkaline Phosphatase: 59 U/L (ref 38–126)
Anion gap: 10 (ref 5–15)
BUN: 12 mg/dL (ref 8–23)
CO2: 21 mmol/L — ABNORMAL LOW (ref 22–32)
Calcium: 8.8 mg/dL — ABNORMAL LOW (ref 8.9–10.3)
Chloride: 102 mmol/L (ref 98–111)
Creatinine, Ser: 0.68 mg/dL (ref 0.44–1.00)
GFR calc Af Amer: >60 mL/min (ref >60)
GFR calc non Af Amer: >60 mL/min (ref >60)
Glucose, Bld: 129 mg/dL — ABNORMAL HIGH (ref 70–99)
Potassium: 3.1 mmol/L — ABNORMAL LOW (ref 3.5–5.1)
Sodium: 133 mmol/L — ABNORMAL LOW (ref 135–145)
Total Bilirubin: 1 mg/dL (ref 0.3–1.2)
Total Protein: 6.9 g/dL (ref 6.5–8.1)

## 2019-06-29 LAB — CBC WITH DIFFERENTIAL/PLATELET
Abs Immature Granulocytes: 0.02 10*3/uL (ref 0.00–0.07)
Basophils Absolute: 0 10*3/uL (ref 0.0–0.1)
Basophils Relative: 0 %
Eosinophils Absolute: 0 10*3/uL (ref 0.0–0.5)
Eosinophils Relative: 0 %
HCT: 42.6 % (ref 36.0–46.0)
Hemoglobin: 14.6 g/dL (ref 12.0–15.0)
Immature Granulocytes: 0 %
Lymphocytes Relative: 5 %
Lymphs Abs: 0.4 10*3/uL — ABNORMAL LOW (ref 0.7–4.0)
MCH: 30.5 pg (ref 26.0–34.0)
MCHC: 34.3 g/dL (ref 30.0–36.0)
MCV: 89.1 fL (ref 80.0–100.0)
Monocytes Absolute: 0.8 10*3/uL (ref 0.1–1.0)
Monocytes Relative: 10 %
Neutro Abs: 6.8 10*3/uL (ref 1.7–7.7)
Neutrophils Relative %: 85 %
Platelets: 180 10*3/uL (ref 150–400)
RBC: 4.78 MIL/uL (ref 3.87–5.11)
RDW: 13.3 % (ref 11.5–15.5)
WBC: 8 10*3/uL (ref 4.0–10.5)
nRBC: 0 % (ref 0.0–0.2)

## 2019-06-29 LAB — APTT: aPTT: 28 seconds (ref 24–36)

## 2019-06-29 LAB — URINALYSIS, ROUTINE W REFLEX MICROSCOPIC
Bilirubin Urine: NEGATIVE
Glucose, UA: NEGATIVE mg/dL
Ketones, ur: 15 mg/dL — AB
Nitrite: POSITIVE — AB
Protein, ur: NEGATIVE mg/dL
Specific Gravity, Urine: >1.03 — ABNORMAL HIGH (ref 1.005–1.030)
pH: 5.5 (ref 5.0–8.0)

## 2019-06-29 LAB — PROTIME-INR
INR: 1 (ref 0.8–1.2)
Prothrombin Time: 13.4 seconds (ref 11.4–15.2)

## 2019-06-29 LAB — URINALYSIS, MICROSCOPIC (REFLEX): WBC, UA: >50 WBC/hpf (ref 0–5)

## 2019-06-29 LAB — LACTIC ACID, PLASMA: Lactic Acid, Venous: 1.4 mmol/L (ref 0.5–1.9)

## 2019-06-29 MED ORDER — CIPROFLOXACIN HCL 500 MG PO TABS: 500.0000 mg | ORAL_TABLET | Freq: Two times a day (BID) | ORAL | 0 refills | Status: DC

## 2019-06-29 MED ORDER — VANCOMYCIN HCL 750 MG/150ML IV SOLN
750.0000 mg | INTRAVENOUS | Status: DC
  Filled 2019-06-29: qty 150

## 2019-06-29 MED ORDER — VANCOMYCIN HCL 1750 MG/350ML IV SOLN
1750.0000 mg | Freq: Once | INTRAVENOUS | Status: DC
  Filled 2019-06-29: qty 350

## 2019-06-29 MED ORDER — SODIUM CHLORIDE 0.9 % IV SOLN: INTRAVENOUS | Status: DC

## 2019-06-29 MED ORDER — VANCOMYCIN HCL IN DEXTROSE 1-5 GM/200ML-% IV SOLN: 1000.0000 mg | Freq: Once | INTRAVENOUS | Status: DC

## 2019-06-29 MED ORDER — VANCOMYCIN HCL IN DEXTROSE 1-5 GM/200ML-% IV SOLN
1000.0000 mg | INTRAVENOUS | Status: AC
  Administered 2019-06-29 (×2): 1000 mg via INTRAVENOUS
  Filled 2019-06-29: qty 200

## 2019-06-29 MED ORDER — ONDANSETRON HCL 4 MG/2ML IJ SOLN
4.0000 mg | Freq: Once | INTRAMUSCULAR | Status: AC
  Administered 2019-06-29: 4 mg via INTRAVENOUS
  Filled 2019-06-29: qty 2

## 2019-06-29 MED ORDER — SODIUM CHLORIDE 0.9 % IV SOLN
2.0000 g | Freq: Once | INTRAVENOUS | Status: AC
  Administered 2019-06-29: 2 g via INTRAVENOUS

## 2019-06-29 MED ORDER — ACETAMINOPHEN 500 MG PO TABS
1000.0000 mg | ORAL_TABLET | Freq: Once | ORAL | Status: AC
  Administered 2019-06-29: 1000 mg via ORAL
  Filled 2019-06-29: qty 2

## 2019-06-29 MED ORDER — AZTREONAM 2 G IJ SOLR
INTRAMUSCULAR | Status: AC
  Filled 2019-06-29: qty 2

## 2019-06-29 MED ORDER — SODIUM CHLORIDE 0.9 % IV SOLN: 1.0000 g | Freq: Three times a day (TID) | INTRAVENOUS | Status: DC

## 2019-06-29 MED ORDER — SODIUM CHLORIDE 0.9 % IV BOLUS
500.0000 mL | Freq: Once | INTRAVENOUS | Status: AC
  Administered 2019-06-29: 500 mL via INTRAVENOUS

## 2019-06-29 MED ORDER — METRONIDAZOLE IN NACL 5-0.79 MG/ML-% IV SOLN
500.0000 mg | Freq: Once | INTRAVENOUS | Status: AC
  Administered 2019-06-29: 500 mg via INTRAVENOUS
  Filled 2019-06-29: qty 100

## 2019-06-29 NOTE — ED Triage Notes (Signed)
Pt reports chills, fever and urinary frequency in the beginning of the week. Pt has tried increasing water intake without relief. Pt noticed urine is darker in color.

## 2019-06-29 NOTE — Discharge Instructions (Addendum)
Pain can still get some notes to do take the antibiotic Cipro as directed for the next 5 days.  Take Tylenol as needed for the fever.  Would expect significant improvement over the next 2 days.  Return for any new or worse symptoms or if not improving significantly after 2 days.

## 2019-06-29 NOTE — Progress Notes (Signed)
[facility] [address], [place], [place] [address] [id number] [phone]  Date:  06/29/2019   Name:  Felicia Lowe   DOB:  12-08-1940   MRN:  [id number]  PCP:  Mosie Lukes, MD    Chief Complaint: No chief complaint on file.   History of Present Illness:  Felicia Lowe is a 78 y.o. very pleasant female patient who presents with the following:  Virtual visit today for concern of illness Primary pt of Dr Charlett Blake Pt and myself are on the call today Patient is at home, I am also at home, patient identity confirmed with 2 factors and she gives consent for virtual visit today  Today she is concerned about a UTI- she notes sx "sort of" - her urine is a bit dark, perhaps mild dysuria No hematuria No cough or ST No abd pain but she feels "sore all over"  She noted chills 2 days ago, yesterday she felt better but last night she was having chills again-teeth chattering, shaking chills No fever yesterday, but today her temp was 100.1 Her daughter checked her pulse at 70 and oxygen at 93% She took some ibuprofen and feels a bit better temporarily, but states that her symptoms will probably come back when the ibuprofen wears off She does not have DM   She is treated for hypertension  Patient Active Problem List   Diagnosis Date Noted  . Low back pain 04/04/2019  . Hyperlipidemia 05/30/2018  . Osteopenia 05/30/2018  . 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 Hyperglycemia 05/25/2017  . Atypical chest pain 04/04/2017  . Pedal edema 04/04/2017  . Preventative health care 11/25/2014  . Unsteady gait 11/25/2014  . Dizziness 09/10/2014  . GERD (gastroesophageal reflux disease) 05/27/2014  . Dermatitis 01/21/2014  . Allergic drug rash 03/17/2013  . Intertrigo 03/17/2013  . BPV (benign positional vertigo) 02/19/2013  . UTI (urinary tract infection) 12/11/2012  . Medicare annual wellness visit, subsequent 02/08/2012  . Thyroid  disease   . Essential hypertension   . Vitamin D deficiency   . Mumps   . Kidney stone   . Obesity   . Allergic state   . Arthritis   . H/O breast biopsy     Past Medical History:  Diagnosis Date  . Allergy   . Arthritis    osteoarthritis, severe, b/l knees &b/l hips  replaced  . BPV (benign positional vertigo) 02/19/2013   mild  . Chicken pox as a child  . GERD (gastroesophageal reflux disease) 05/27/2014  . H/O breast biopsy    benign, on left  . Hypertension   . Kidney stone 2000   nephritis as a child  . Measles as a child  . Mumps as a child  . Obesity   . Osteoporosis   . Preventative health care 11/25/2014  . Pruritus 01/21/2017  . Scabies   . Skin lesion of face 05/18/2012  . Thyroid disease   . Unsteady gait 11/25/2014  . UTI (urinary tract infection) 12/11/2012  . Viral respiratory illness 05/21/2013  . Vitamin D deficiency     Past Surgical History:  Procedure Laterality Date  . CATARACT EXTRACTION Left 2017  . EYE SURGERY Left 2019   cataract removal left  . HIP SURGERY     both hips  . KNEE SURGERY     both knees replaced    Social History   Tobacco Use  . Smoking status: Never Smoker  .  Smokeless tobacco: Never Used  Substance Use Topics  . Alcohol use: Yes    Comment: Occasional glass of wine  . Drug use: No    Family History  Problem Relation Age of Onset  . Hyperlipidemia Mother   . Hypertension Mother   . Heart attack Mother   . Cancer Father        lung- smoker  . Heart attack Father        X 2  . Thyroid disease Brother   . Hypertension Son   . Rashes / Skin problems Son   . Hernia Son        umbilical  . Cancer Paternal Grandmother   . Macular degeneration Brother     Allergies  Allergen Reactions  . Eggs Or Egg-Derived Products Shortness Of Breath and Rash  . Sulfa Antibiotics Nausea And Vomiting  . Keflex [Cephalexin] Rash    Rash   . Penicillins Rash  . Poison Ivy Extract [Poison Ivy Extract] Rash     Medication list has been reviewed and updated.  Current Outpatient Medications on File Prior to Visit  Medication Sig Dispense Refill  . Ascorbic Acid (VITAMIN C) 1000 MG tablet Take 1,000 mg by mouth daily.    Marland Kitchen aspirin 81 MG tablet Take 81 mg by mouth daily.    . Cholecalciferol (VITAMIN D) 2000 UNITS tablet Take 2,000 Units by mouth daily.    . Coenzyme Q10 (CO Q 10) 100 MG CAPS Take 400 mg by mouth daily.    . furosemide (LASIX) 20 MG tablet Take 1 tablet (20 mg total) by mouth daily as needed. 30 tablet 4  . lisinopril-hydrochlorothiazide (ZESTORETIC) 10-12.5 MG tablet TAKE 1 TABLET BY MOUTH EVERY DAY 90 tablet 1  . Omega-3 Fatty Acids (FISH OIL) 1000 MG CPDR Take 1 capsule by mouth daily.    . Probiotic Product (PROBIOTIC DAILY PO) Take by mouth.    . SYNTHROID 125 MCG tablet TAKE 1 TABLET (125 MCG TOTAL) BY MOUTH DAILY. SYNTHROID 30 tablet 26   No current facility-administered medications on file prior to visit.    Review of Systems:  As per HPI- otherwise negative.   Physical Examination: There were no vitals filed for this visit. There were no vitals filed for this visit. There is no height or weight on file to calculate BMI. Ideal Body Weight:    Spoke to pt on the telephone today-she sounds well, no cough or distress  Assessment and Plan: Shaking chills  Virtual visit today for patient with concern of low-grade fever and shaking chills for the last 1 to 2 days.  She wonders if she may have a urinary tract infection, she has somewhat vague urinary symptoms.  I counseled her that a simple UTI should not cause systemic symptoms such as fever or chills, she may have pyelonephritis or another issue such as COVID-19 or pneumonia.  I counseled her to proceed to the emergency department right away for further evaluation, and she agrees to do so-she plans to come to the med center [facility] to pt for about 9 minutes today Signed Lamar Blinks, MD

## 2019-06-29 NOTE — Progress Notes (Addendum)
Pharmacy Antibiotic Note  Felicia Lowe is a 78 y.o. female admitted on 06/29/2019 with sepsis.  Pharmacy has been consulted for aztreonam dosing.  Presenting with chills, fever, and urinary frequency. Has allergy listed for cephalosporins - from records, correlates with rash allergy documented when received Keflex as outpatient. WBC WNL, LA 1.4, temp 103.2. UA showing large leukocytes, + nitrite, many bacteria, and WBC>50. Scr 0.68 (CrCl 57 mL/min).   Plan: Aztreonam 1g IV every 8 hours Vancomycin 1750 mg IV once then 750 mg IV every 24 hours (estAUC 437) Monitor renal fx, cx results, clinical pic, and opportunities for de-escalation (could do levaquin vs meropenem)  Height: 5\' 2"  (157.5 cm) Weight: 178 lb (80.7 kg) IBW/kg (Calculated) : 50.1  Temp (24hrs), Avg:103.2 F (39.6 C), Min:103.2 F (39.6 C), Max:103.2 F (39.6 C)  No results for input(s): WBC, CREATININE, LATICACIDVEN, VANCOTROUGH, VANCOPEAK, VANCORANDOM, GENTTROUGH, GENTPEAK, GENTRANDOM, TOBRATROUGH, TOBRAPEAK, TOBRARND, AMIKACINPEAK, AMIKACINTROU, AMIKACIN in the last 168 hours.  CrCl cannot be calculated (Patient's most recent lab result is older than the maximum 21 days allowed.).    Allergies  Allergen Reactions  . Eggs Or Egg-Derived Products Shortness Of Breath and Rash  . Sulfa Antibiotics Nausea And Vomiting  . Keflex [Cephalexin] Rash    Rash   . Penicillins Rash  . Poison Ivy Extract [Poison Ivy Extract] Rash    Antimicrobials this admission: Aztreonam 12/24 >>  Vancomycin 12/24>> Metronidazole 12/24 x1  Dose adjustments this admission: N/A  Microbiology results: 12/24 BCx: sent 12/24 UCx: sent   Thank you for allowing pharmacy to be a part of this patient's care.  Antonietta Jewel, PharmD, BCCCP Clinical Pharmacist  Phone: [phone]  Please check AMION for all [facility] phone numbers After 10:00 PM, call [place] [phone] 06/29/2019 11:44 AM

## 2019-06-29 NOTE — ED Notes (Signed)
[facility]) [phone]

## 2019-06-29 NOTE — ED Notes (Signed)
Patient transported to CT 

## 2019-06-29 NOTE — ED Provider Notes (Signed)
[facility] EMERGENCY DEPARTMENT Provider Note   CSN: [id number] Arrival date & time: 06/29/19  1051     History Chief Complaint  Patient presents with  . Chills  . Fever  . Urinary Frequency    Felicia Lowe is a 78 y.o. female.  Patient presenting with chills fever and some urinary frequency but no dysuria.  Patient has had multiple urinary tract infections in the past.  Normally gets symptoms but did not have them.  But she started to get suspicious that she may have had a urinary tract infection or early some infection of some sort.  Right ear with temp of 103.  Heart rate was 114 respirations 20 blood pressure 130/66.  Patient's vital signs concerning for sepsis so sepsis protocol initiated.  Patient denies any respiratory symptoms.  No cough.  In addition patient has had a history of kidney stones in the past.  But has not any pain in the flank area.         Past Medical History:  Diagnosis Date  . Allergy   . Arthritis    osteoarthritis, severe, b/l knees &b/l hips  replaced  . BPV (benign positional vertigo) 02/19/2013   mild  . Chicken pox as a child  . GERD (gastroesophageal reflux disease) 05/27/2014  . H/O breast biopsy    benign, on left  . Hypertension   . Kidney stone 2000   nephritis as a child  . Measles as a child  . Mumps as a child  . Obesity   . Osteoporosis   . Preventative health care 11/25/2014  . Pruritus 01/21/2017  . Scabies   . Skin lesion of face 05/18/2012  . Thyroid disease   . Unsteady gait 11/25/2014  . UTI (urinary tract infection) 12/11/2012  . Viral respiratory illness 05/21/2013  . Vitamin D deficiency     Patient Active Problem List   Diagnosis Date Noted  . Low back pain 04/04/2019  . Hyperlipidemia 05/30/2018  . Osteopenia 05/30/2018  . 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 Hyperglycemia 05/25/2017  . Atypical chest pain 04/04/2017  . Pedal edema 04/04/2017  . Preventative health care 11/25/2014  . Unsteady  gait 11/25/2014  . Dizziness 09/10/2014  . GERD (gastroesophageal reflux disease) 05/27/2014  . Dermatitis 01/21/2014  . Allergic drug rash 03/17/2013  . Intertrigo 03/17/2013  . BPV (benign positional vertigo) 02/19/2013  . UTI (urinary tract infection) 12/11/2012  . Medicare annual wellness visit, subsequent 02/08/2012  . Thyroid disease   . Essential hypertension   . Vitamin D deficiency   . Mumps   . Kidney stone   . Obesity   . Allergic state   . Arthritis   . H/O breast biopsy     Past Surgical History:  Procedure Laterality Date  . CATARACT EXTRACTION Left 2017  . EYE SURGERY Left 2019   cataract removal left  . HIP SURGERY     both hips  . KNEE SURGERY     both knees replaced     OB History   No obstetric history on file.     Family History  Problem Relation Age of Onset  . Hyperlipidemia Mother   . Hypertension Mother   . Heart attack Mother   . Cancer Father        lung- smoker  . Heart attack Father        X 2  . Thyroid disease Brother   . Hypertension Son   . Rashes / Skin  problems Son   . Hernia Son        umbilical  . Cancer Paternal Grandmother   . Macular degeneration Brother     Social History   Tobacco Use  . Smoking status: Never Smoker  . Smokeless tobacco: Never Used  Substance Use Topics  . Alcohol use: Yes    Comment: Occasional glass of wine  . Drug use: No    Home Medications Prior to Admission medications   Medication Sig Start Date End Date Taking? Authorizing Provider  Ascorbic Acid (VITAMIN C) 1000 MG tablet Take 1,000 mg by mouth daily.   Yes [provider]  aspirin 81 MG tablet Take 81 mg by mouth daily.   Yes [provider]  Cholecalciferol (VITAMIN D) 2000 UNITS tablet Take 2,000 Units by mouth daily.   Yes [provider]  Coenzyme Q10 (CO Q 10) 100 MG CAPS Take 400 mg by mouth daily.   Yes [provider]  lisinopril-hydrochlorothiazide (ZESTORETIC) 10-12.5 MG tablet TAKE  1 TABLET BY MOUTH EVERY DAY 06/20/19  Yes Mosie Lukes, MD  Omega-3 Fatty Acids (FISH OIL) 1000 MG CPDR Take 1 capsule by mouth daily.   Yes [provider]  Probiotic Product (PROBIOTIC DAILY PO) Take by mouth.   Yes [provider]  SYNTHROID 125 MCG tablet TAKE 1 TABLET (125 MCG TOTAL) BY MOUTH DAILY. SYNTHROID 06/26/19  Yes Mosie Lukes, MD  ciprofloxacin (CIPRO) 500 MG tablet Take 1 tablet (500 mg total) by mouth 2 (two) times daily. 06/29/19   Fredia Sorrow, MD  furosemide (LASIX) 20 MG tablet Take 1 tablet (20 mg total) by mouth daily as needed. 05/04/16   Mosie Lukes, MD    Allergies    Eggs or egg-derived products, Sulfa antibiotics, Keflex [cephalexin], Penicillins, and Poison ivy extract [poison ivy extract]  Review of Systems   Review of Systems  Constitutional: Positive for chills and fever.  HENT: Negative for congestion, rhinorrhea and sore throat.   Eyes: Negative for visual disturbance.  Respiratory: Negative for cough and shortness of breath.   Cardiovascular: Negative for chest pain and leg swelling.  Gastrointestinal: Negative for abdominal pain, diarrhea, nausea and vomiting.  Genitourinary: Positive for frequency. Negative for dysuria, flank pain and hematuria.  Musculoskeletal: Negative for back pain and neck pain.  Skin: Negative for rash.  Neurological: Negative for dizziness, light-headedness and headaches.  Hematological: Does not bruise/bleed easily.  Psychiatric/Behavioral: Negative for confusion.    Physical Exam Updated Vital Signs BP (!) 86/41   Pulse 79   Temp 99.1 F (37.3 C) (Oral)   Resp 19   Ht 1.575 m (_0 )   Wt 80.7 kg   SpO2 96%   BMI 32.56 kg/m   Physical Exam Vitals and nursing note reviewed.  Constitutional:      General: She is not in acute distress.    Appearance: Normal appearance. She is well-developed.  HENT:     Head: Normocephalic and atraumatic.  Eyes:     Extraocular Movements:  Extraocular movements intact.     Conjunctiva/sclera: Conjunctivae normal.     Pupils: Pupils are equal, round, and reactive to light.  Cardiovascular:     Rate and Rhythm: Regular rhythm. Tachycardia present.     Heart sounds: No murmur.  Pulmonary:     Effort: Pulmonary effort is normal. No respiratory distress.     Breath sounds: Normal breath sounds.  Abdominal:     Palpations: Abdomen is soft.  Tenderness: There is no abdominal tenderness.  Musculoskeletal:        General: Normal range of motion.     Cervical back: Normal range of motion and neck supple.  Skin:    General: Skin is warm and dry.     Capillary Refill: Capillary refill takes less than 2 seconds.  Neurological:     General: No focal deficit present.     Mental Status: She is alert and oriented to person, place, and time.     Cranial Nerves: No cranial nerve deficit.     Sensory: No sensory deficit.     Motor: No weakness.     ED Results / Procedures / Treatments   Labs (all labs ordered are listed, but only abnormal results are displayed) Labs Reviewed  COMPREHENSIVE METABOLIC PANEL - Abnormal; Notable for the following components:      Result Value   Sodium 133 (*)    Potassium 3.1 (*)    CO2 21 (*)    Glucose, Bld 129 (*)    Calcium 8.8 (*)    All other components within normal limits  CBC WITH DIFFERENTIAL/PLATELET - Abnormal; Notable for the following components:   Lymphs Abs 0.4 (*)    All other components within normal limits  URINALYSIS, ROUTINE W REFLEX MICROSCOPIC - Abnormal; Notable for the following components:   APPearance CLOUDY (*)    Specific Gravity, Urine >1.030 (*)    Hgb urine dipstick MODERATE (*)    Ketones, ur 15 (*)    Nitrite POSITIVE (*)    Leukocytes,Ua LARGE (*)    All other components within normal limits  URINALYSIS, MICROSCOPIC (REFLEX) - Abnormal; Notable for the following components:   Bacteria, UA MANY (*)    All other components within normal limits  CULTURE,  BLOOD (ROUTINE X 2)  CULTURE, BLOOD (ROUTINE X 2)  URINE CULTURE  LACTIC ACID, PLASMA  APTT  PROTIME-INR    EKG EKG Interpretation  Date/Time:  Thursday June 29 2019 11:34:39 EST Ventricular Rate:  99 PR Interval:    QRS Duration: 96 QT Interval:  328 QTC Calculation: 421 R Axis:   47 Text Interpretation: Sinus tachycardia Low voltage, extremity and precordial leads Confirmed by Fredia Sorrow [phone]) on 06/29/2019 12:01:25 PM   Radiology DG Chest Port 1 View  Result Date: 06/29/2019 CLINICAL DATA:  Chills, fever EXAM: PORTABLE CHEST 1 VIEW COMPARISON:  None. FINDINGS: Heart and mediastinal contours are within normal limits. No focal opacities or effusions. No acute bony abnormality. IMPRESSION: No active disease. Electronically Signed   By: Rolm Baptise M.D.   On: 06/29/2019 11:35   CT Renal Stone Study  Result Date: 06/29/2019 CLINICAL DATA:  Flank pain, chills, fever, urinary frequency EXAM: CT ABDOMEN AND PELVIS WITHOUT CONTRAST TECHNIQUE: Multidetector CT imaging of the abdomen and pelvis was performed following the standard protocol without IV contrast. COMPARISON:  None. FINDINGS: Lower chest: Lung bases are clear. Hepatobiliary: Unenhanced liver is unremarkable. Layering gallstones (series 2/image 21), without associated inflammatory changes. No intrahepatic or extrahepatic ductal dilatation. Pancreas: Within normal limits. Spleen: Within normal limits. Adrenals/Urinary Tract: Adrenal glands are within normal limits. 3 mm nonobstructing left lower pole renal calculus (series 2/image 31). Right kidney is within normal limits. No definite ureteral calculi, noting streak artifact distally from the bilateral hip prostheses. No hydronephrosis. Bladder is partially obscured by streak artifact but grossly unremarkable. Stomach/Bowel: Stomach is notable for a small hiatal hernia. No evidence of bowel obstruction. Moderate duodenal diverticulum (series 2/image  25). Normal appendix  (series 2/image 45). Extensive colonic diverticulosis, without evidence of diverticulitis. Vascular/Lymphatic: No evidence of abdominal aortic aneurysm. Atherosclerotic calcifications of the abdominal aorta and branch vessels. No suspicious abdominopelvic lymphadenopathy. Reproductive: Suspected prior hysterectomy, although obscured by streak artifact. Bilateral ovaries are unremarkable. Other: No abdominopelvic ascites. Musculoskeletal: Degenerative changes of the visualized thoracolumbar spine. Bilateral hip prostheses, without evidence of complication. IMPRESSION: 3 mm nonobstructing left lower pole renal calculus. No ureteral or bladder calculi. No hydronephrosis. Cholelithiasis, without associated inflammatory changes. Additional ancillary findings as above. Electronically Signed   By: Julian Hy M.D.   On: 06/29/2019 13:29    Procedures Procedures (including critical care time) CRITICAL CARE Performed by: Fredia Sorrow Total critical care time: 30 minutes Critical care time was exclusive of separately billable procedures and treating other patients. Critical care was necessary to treat or prevent imminent or life-threatening deterioration. Critical care was time spent personally by me on the following activities: development of treatment plan with patient and/or surrogate as well as nursing, discussions with consultants, evaluation of patient's response to treatment, examination of patient, obtaining history from patient or surrogate, ordering and performing treatments and interventions, ordering and review of laboratory studies, ordering and review of radiographic studies, pulse oximetry and re-evaluation of patient's condition.  Medications Ordered in ED Medications  0.9 %  sodium chloride infusion (has no administration in time range)  0.9 %  sodium chloride infusion ( Intravenous New Bag/Given 06/29/19 1144)  aztreonam (AZACTAM) 2 g injection (  Not Given 06/29/19 1148)  aztreonam  (AZACTAM) 1 g in sodium chloride 0.9 % 100 mL IVPB (has no administration in time range)  vancomycin (VANCOCIN) IVPB 1000 mg/200 mL premix (1,000 mg Intravenous New Bag/Given 06/29/19 1437)  vancomycin (VANCOREADY) IVPB 750 mg/150 mL (has no administration in time range)  aztreonam (AZACTAM) 2 g in sodium chloride 0.9 % 100 mL IVPB (0 g Intravenous Stopped 06/29/19 1231)  metroNIDAZOLE (FLAGYL) IVPB 500 mg (0 mg Intravenous Stopped 06/29/19 1408)  ondansetron (ZOFRAN) injection 4 mg (4 mg Intravenous Given 06/29/19 1148)  acetaminophen (TYLENOL) tablet 1,000 mg (1,000 mg Oral Given 06/29/19 1147)  sodium chloride 0.9 % bolus 500 mL (0 mLs Intravenous Stopped 06/29/19 1435)    ED Course  I have reviewed the triage vital signs and the nursing notes.  Pertinent labs & imaging results that were available during my care of the patient were reviewed by me and considered in my medical decision making (see chart for details).    MDM Rules/Calculators/A&P                       Patient with urinalysis definitely consistent with urinary tract infection.  Positive nitrite.  Patient also met sepsis criteria based on her temperature and her tachycardia.  She was not hypotensive.  Patient's lactic acid not elevated blood cultures and urine culture pending.  No significant leukocytosis no significant electrolyte abnormalities.  Patient received is lactam Flagyl and vancomycin since she has allergies to penicillins and cephalosporins.  Patient never received full fluid challenge.  Because she was not hypotensive.  The nursing did go on to register some lower blood pressures.  But I checked them on the same automatic cuff.  And got pressures of 107105.  No longer tachycardic patient is feeling very good.  Patient really wants to go home.  Patient is getting her vancomycin now.  If her blood pressures remained at 105-107 range or in particular above 100 and patient  is probably a good candidate to go home.  She will  return for any new or worse symptoms.  Chest x-ray was negative.  No Covid symptoms.  Did discuss admission with the patient but she really prefers to go home since it is Christmas Eve.  Patient without any hard criteria saying that she has to be admitted at this time.  Patient will be started on Cipro as an outpatient.  Prescriptions already been sent to her pharmacy family will pick it up some [facility] would not be available tomorrow.  Final Clinical Impression(s) / ED Diagnoses Final diagnoses:  Acute cystitis without hematuria  Fever, unspecified fever cause    Rx / DC Orders ED Discharge Orders         Ordered    ciprofloxacin (CIPRO) 500 MG tablet  2 times daily     06/29/19 1520           Fredia Sorrow, MD 06/29/19 1536

## 2019-07-01 LAB — URINE CULTURE: Culture: >=100000 — AB

## 2019-07-03 ENCOUNTER — Ambulatory Visit: Payer: Medicare HMO | Admitting: Family Medicine

## 2019-07-03 ENCOUNTER — Encounter: Payer: Self-pay | Admitting: Family Medicine

## 2019-07-03 ENCOUNTER — Ambulatory Visit (INDEPENDENT_AMBULATORY_CARE_PROVIDER_SITE_OTHER): Payer: Medicare HMO | Admitting: Family Medicine

## 2019-07-03 VITALS — HR 82 | Temp 98.1°F | Ht 62.0 in | Wt 178.0 lb

## 2019-07-03 DIAGNOSIS — N390 Urinary tract infection, site not specified: Secondary | ICD-10-CM

## 2019-07-03 NOTE — Assessment & Plan Note (Signed)
Reviewed ER visit / labs Culture-- + UTI and sensitive to cipro Finish cipro  F/u pcp for repeat UA

## 2019-07-03 NOTE — Progress Notes (Signed)
Virtual Visit via Telephone Note  I connected with Felicia Lowe on 07/03/19 at 10:40 AM EST by telephone and verified that I am speaking with the correct person using two identifiers.  Location: Patient: home alone  Provider: office    I discussed the limitations, risks, security and privacy concerns of performing an evaluation and management service by telephone and the availability of in person appointments. I also discussed with the patient that there may be a patient responsible charge related to this service. The patient expressed understanding and agreed to proceed.   History of Present Illness: Pt is home -- she went to Er 12/24 with urinary urgency, fever and tachycardia---- she states she suspected a UTI She has a hx of UTI Pt was given vanco/ flagyl in ER and sent home with cipro Urine culture-- + UTI-- sensitive to cipro  Pt states her symptoms have resolved--- she has 7 days total of cipro  She has a f/u with pcp on 1/5    Past Medical History:  Diagnosis Date  . Allergy   . Arthritis    osteoarthritis, severe, b/l knees &b/l hips  replaced  . BPV (benign positional vertigo) 02/19/2013   mild  . Chicken pox as a child  . GERD (gastroesophageal reflux disease) 05/27/2014  . H/O breast biopsy    benign, on left  . Hypertension   . Kidney stone 2000   nephritis as a child  . Measles as a child  . Mumps as a child  . Obesity   . Osteoporosis   . Preventative health care 11/25/2014  . Pruritus 01/21/2017  . Scabies   . Skin lesion of face 05/18/2012  . Thyroid disease   . Unsteady gait 11/25/2014  . UTI (urinary tract infection) 12/11/2012  . Viral respiratory illness 05/21/2013  . Vitamin D deficiency    Current Outpatient Medications on File Prior to Visit  Medication Sig Dispense Refill  . Ascorbic Acid (VITAMIN C) 1000 MG tablet Take 1,000 mg by mouth daily.    Marland Kitchen aspirin 81 MG tablet Take 81 mg by mouth daily.    . Cholecalciferol (VITAMIN D) 2000 UNITS tablet  Take 2,000 Units by mouth daily.    . ciprofloxacin (CIPRO) 500 MG tablet Take 1 tablet (500 mg total) by mouth 2 (two) times daily. 14 tablet 0  . Coenzyme Q10 (CO Q 10) 100 MG CAPS Take 400 mg by mouth daily.    . furosemide (LASIX) 20 MG tablet Take 1 tablet (20 mg total) by mouth daily as needed. 30 tablet 4  . lisinopril-hydrochlorothiazide (ZESTORETIC) 10-12.5 MG tablet TAKE 1 TABLET BY MOUTH EVERY DAY 90 tablet 1  . Omega-3 Fatty Acids (FISH OIL) 1000 MG CPDR Take 1 capsule by mouth daily.    . Probiotic Product (PROBIOTIC DAILY PO) Take by mouth.    . SYNTHROID 125 MCG tablet TAKE 1 TABLET (125 MCG TOTAL) BY MOUTH DAILY. SYNTHROID 30 tablet 26   No current facility-administered medications on file prior to visit.   Social History   Socioeconomic History  . Marital status: Married    Spouse name: Not on file  . Number of children: Not on file  . Years of education: Not on file  . Highest education level: Not on file  Occupational History  . Not on file  Tobacco Use  . Smoking status: Never Smoker  . Smokeless tobacco: Never Used  Substance and Sexual Activity  . Alcohol use: Yes    Comment: Occasional  glass of wine  . Drug use: No  . Sexual activity: Never  Other Topics Concern  . Not on file  Social History Narrative  . Not on file   Social Determinants of Health   Financial Resource Strain:   . Difficulty of Paying Living Expenses: Not on file  Food Insecurity:   . Worried About Charity fundraiser in the Last Year: Not on file  . Ran Out of Food in the Last Year: Not on file  Transportation Needs:   . Lack of Transportation (Medical): Not on file  . Lack of Transportation (Non-Medical): Not on file  Physical Activity:   . Days of Exercise per Week: Not on file  . Minutes of Exercise per Session: Not on file  Stress:   . Feeling of Stress : Not on file  Social Connections:   . Frequency of Communication with Friends and Family: Not on file  . Frequency of  Social Gatherings with Friends and Family: Not on file  . Attends Religious Services: Not on file  . Active Member of Clubs or Organizations: Not on file  . Attends Archivist Meetings: Not on file  . Marital Status: Not on file  Intimate Partner Violence:   . Fear of Current or Ex-Partner: Not on file  . Emotionally Abused: Not on file  . Physically Abused: Not on file  . Sexually Abused: Not on file   Past Surgical History:  Procedure Laterality Date  . CATARACT EXTRACTION Left 2017  . EYE SURGERY Left 2019   cataract removal left  . HIP SURGERY     both hips  . KNEE SURGERY     both knees replaced   Allergies  Allergen Reactions  . Eggs Or Egg-Derived Products Shortness Of Breath and Rash  . Sulfa Antibiotics Nausea And Vomiting  . Keflex [Cephalexin] Rash    Rash   . Penicillins Rash  . Poison Ivy Extract [Poison Ivy Extract] Rash    Observations/Objective: Vitals:   07/03/19 1046  Pulse: 82  Temp: 98.1 F (36.7 C)  SpO2: 91%  pt is in NAD    Assessment and Plan: 1. Urinary tract infection without hematuria, site unspecified Finish cipro F/u pcp for repeat ua   Follow Up Instructions:    I discussed the assessment and treatment plan with the patient. The patient was provided an opportunity to ask questions and all were answered. The patient agreed with the plan and demonstrated an understanding of the instructions.   The patient was advised to call back or seek an in-person evaluation if the symptoms worsen or if the condition fails to improve as anticipated.  I provided 12 minutes of non-face-to-face time during this encounter.   Ann Held, DO

## 2019-07-04 ENCOUNTER — Encounter: Payer: Self-pay | Admitting: Family Medicine

## 2019-07-04 ENCOUNTER — Telehealth: Payer: Self-pay

## 2019-07-04 LAB — CULTURE, BLOOD (ROUTINE X 2)
Culture: NO GROWTH
Culture: NO GROWTH
Special Requests: ADEQUATE
Special Requests: ADEQUATE

## 2019-07-04 NOTE — Telephone Encounter (Signed)
Copied from [place] [id number]. Topic: General - Other >> Jul 04, 2019  1:46 PM Leward Quan A wrote: Reason for CRM: Patient daughter Sharee Pimple called to say that patient started having chills around 12 PM and a temp of 99.0. calling to inform Dr Charlett Blake per instructions from doctor in the ED. Asking for Dr to reach out to the patient at Ph#  [phone]

## 2019-07-04 NOTE — Telephone Encounter (Signed)
Felicia Lowe has called back again and her mothers fever has risen to 101.6 and she is having severe chills. They need to make some kind of move before evening as it probably may rise further. She is wanting to know if they should just take her back to the ER but really wants Dr Randel Pigg input. If unable to reach pt husband at home daughter Jill's cell is [phone]. Speak to husband or Felicia Lowe as mother is too sick to want to go anywhere. Please call back asap

## 2019-07-04 NOTE — Telephone Encounter (Signed)
Hi Dr. Charlett Blake: Sorry to bother as I know you're swamped.  Have been feeling better every day since Christmas Eve ER visit. Taking 500 mg at 8 am - 8 pm. Haven't needed anything else. Drinking lots of water. Sat down at 10: 30  to work at computer and about noon got really cold. Headed to sun room to wrap up but awful chills began. Temp has gone from 99 to 100.6.  Appetite good. Not chilling now. My daughter Sharee Pimple has called I believe.  Took 200 mg ibruprofen AT 2:40.Headed to bed to take a nap.  Suggestions are welcome.  Tolanda Mielcarek  06/04/1941  [phone] copied from my chart message

## 2019-07-05 ENCOUNTER — Ambulatory Visit: Payer: Medicare HMO | Attending: Internal Medicine

## 2019-07-05 DIAGNOSIS — Z20828 Contact with and (suspected) exposure to other viral communicable diseases: Secondary | ICD-10-CM | POA: Diagnosis not present

## 2019-07-05 DIAGNOSIS — Z20822 Contact with and (suspected) exposure to covid-19: Secondary | ICD-10-CM

## 2019-07-05 NOTE — Telephone Encounter (Signed)
Thanks, got my shot yesterday afternoon, appreciate your help with this. We spoke to them about adding Tylenol and going to ER if fevers and chills did not break yesterday

## 2019-07-06 ENCOUNTER — Encounter: Payer: Self-pay | Admitting: Family Medicine

## 2019-07-06 LAB — NOVEL CORONAVIRUS, NAA: SARS-CoV-2, NAA: NOT DETECTED

## 2019-07-11 ENCOUNTER — Ambulatory Visit (INDEPENDENT_AMBULATORY_CARE_PROVIDER_SITE_OTHER): Payer: Medicare HMO | Admitting: Family Medicine

## 2019-07-11 ENCOUNTER — Other Ambulatory Visit: Payer: Self-pay

## 2019-07-11 ENCOUNTER — Encounter: Payer: Self-pay | Admitting: Family Medicine

## 2019-07-11 VITALS — BP 112/71 | Temp 98.1°F | Wt 178.0 lb

## 2019-07-11 DIAGNOSIS — E785 Hyperlipidemia, unspecified: Secondary | ICD-10-CM

## 2019-07-11 DIAGNOSIS — R300 Dysuria: Secondary | ICD-10-CM | POA: Diagnosis not present

## 2019-07-11 DIAGNOSIS — I10 Essential (primary) hypertension: Secondary | ICD-10-CM

## 2019-07-11 DIAGNOSIS — N390 Urinary tract infection, site not specified: Secondary | ICD-10-CM | POA: Diagnosis not present

## 2019-07-11 DIAGNOSIS — R739 Hyperglycemia, unspecified: Secondary | ICD-10-CM | POA: Diagnosis not present

## 2019-07-11 DIAGNOSIS — E079 Disorder of thyroid, unspecified: Secondary | ICD-10-CM

## 2019-07-11 DIAGNOSIS — E559 Vitamin D deficiency, unspecified: Secondary | ICD-10-CM | POA: Diagnosis not present

## 2019-07-11 MED ORDER — CIPROFLOXACIN HCL 250 MG PO TABS: 250.0000 mg | ORAL_TABLET | Freq: Two times a day (BID) | ORAL | 0 refills | Status: DC

## 2019-07-11 NOTE — Progress Notes (Signed)
Completed the antibiotic, has some frequency at night she states she is 99.9 percent better.  She wants to know about the vaccine

## 2019-07-13 ENCOUNTER — Encounter: Payer: Self-pay | Admitting: Family Medicine

## 2019-07-15 ENCOUNTER — Ambulatory Visit: Payer: Medicare Other | Attending: Internal Medicine

## 2019-07-15 DIAGNOSIS — Z23 Encounter for immunization: Secondary | ICD-10-CM

## 2019-07-15 NOTE — Progress Notes (Signed)
   Covid-19 Vaccination Clinic  Name:  Felicia Lowe    MRN: [id number] DOB: 12-10-1940  07/15/2019  Ms. Cage was observed post Covid-19 immunization for 15 minutes without incidence. She was provided with Vaccine Information Sheet and instruction to access the V-Safe system.   [name] was instructed to call 911 with any severe reactions post vaccine: Marland Kitchen Difficulty breathing  . Swelling of your face and throat  . A fast heartbeat  . A bad rash all over your body  . Dizziness and weakness    Immunizations Administered    Name Date Dose VIS Date Route   Pfizer COVID-19 Vaccine 07/15/2019 10:36 AM 0.3 mL 06/16/2019 Intramuscular   Manufacturer: [employer]   Lot: [id number]   [place]: [id number]

## 2019-07-17 ENCOUNTER — Encounter: Payer: Self-pay | Admitting: Family Medicine

## 2019-07-17 NOTE — Assessment & Plan Note (Addendum)
maintain heart healthy diet, increase exercise, avoid trans fats, consider a krill oil cap daily

## 2019-07-17 NOTE — Assessment & Plan Note (Signed)
hgba1c acceptable, minimize simple carbs. Increase exercise as tolerated.  

## 2019-07-17 NOTE — Progress Notes (Signed)
Patient ID: Felicia Lowe, female   DOB: 1941/05/29, 79 y.o.   MRN: [id number] Virtual Visit via phone Note  I connected with [name] on 07/11/19 at  2:00 PM EST by a phone enabled telemedicine application and verified that I am speaking with the correct person using two identifiers.  Location: Patient: home Provider: home   I discussed the limitations of evaluation and management by telemedicine and the availability of in person appointments. The patient expressed understanding and agreed to proceed. Magdalene Molly, CMA was able to get her set up on a visit, phone after being unable to set up a video visit.    Subjective:    Patient ID: Felicia Lowe, female    DOB: 1940/08/08, 79 y.o.   MRN: [id number]  No chief complaint on file.   HPI Patient is in today for follow up on chronic medical concerns including hypertension, hypothyroidism and more more. No recent febrile illness or hospitalizations. She has recently been treated for  UTI and initially felt better but then when she finished the course of antibiotics she has noted urinary frequency again. No dysuria or hematuria at present. No fevers or chills. Denies CP/palp/SOB/HA/congestion/fevers/GI c/o. Taking meds as prescribed  Past Medical History:  Diagnosis Date  . Allergy   . Arthritis    osteoarthritis, severe, b/l knees &b/l hips  replaced  . BPV (benign positional vertigo) 02/19/2013   mild  . Chicken pox as a child  . GERD (gastroesophageal reflux disease) 05/27/2014  . H/O breast biopsy    benign, on left  . Hypertension   . Kidney stone 2000   nephritis as a child  . Measles as a child  . Mumps as a child  . Obesity   . Osteoporosis   . Preventative health care 11/25/2014  . Pruritus 01/21/2017  . Scabies   . Skin lesion of face 05/18/2012  . Thyroid disease   . Unsteady gait 11/25/2014  . UTI (urinary tract infection) 12/11/2012  . Viral respiratory illness 05/21/2013  . Vitamin D deficiency     Past  Surgical History:  Procedure Laterality Date  . CATARACT EXTRACTION Left 2017  . EYE SURGERY Left 2019   cataract removal left  . HIP SURGERY     both hips  . KNEE SURGERY     both knees replaced    Family History  Problem Relation Age of Onset  . Hyperlipidemia Mother   . Hypertension Mother   . Heart attack Mother   . Cancer Father        lung- smoker  . Heart attack Father        X 2  . Thyroid disease Brother   . Hypertension Son   . Rashes / Skin problems Son   . Hernia Son        umbilical  . Cancer Paternal Grandmother   . Macular degeneration Brother     Social History   Socioeconomic History  . Marital status: Married    Spouse name: Not on file  . Number of children: Not on file  . Years of education: Not on file  . Highest education level: Not on file  Occupational History  . Not on file  Tobacco Use  . Smoking status: Never Smoker  . Smokeless tobacco: Never Used  Substance and Sexual Activity  . Alcohol use: Yes    Comment: Occasional glass of wine  . Drug use: No  . Sexual activity: Never  Other Topics Concern  .  Not on file  Social History Narrative  . Not on file   Social Determinants of Health   Financial Resource Strain:   . Difficulty of Paying Living Expenses: Not on file  Food Insecurity:   . Worried About Charity fundraiser in the Last Year: Not on file  . Ran Out of Food in the Last Year: Not on file  Transportation Needs:   . Lack of Transportation (Medical): Not on file  . Lack of Transportation (Non-Medical): Not on file  Physical Activity:   . Days of Exercise per Week: Not on file  . Minutes of Exercise per Session: Not on file  Stress:   . Feeling of Stress : Not on file  Social Connections:   . Frequency of Communication with Friends and Family: Not on file  . Frequency of Social Gatherings with Friends and Family: Not on file  . Attends Religious Services: Not on file  . Active Member of Clubs or Organizations: Not  on file  . Attends Archivist Meetings: Not on file  . Marital Status: Not on file  Intimate Partner Violence:   . Fear of Current or Ex-Partner: Not on file  . Emotionally Abused: Not on file  . Physically Abused: Not on file  . Sexually Abused: Not on file    Outpatient Medications Prior to Visit  Medication Sig Dispense Refill  . Ascorbic Acid (VITAMIN C) 1000 MG tablet Take 1,000 mg by mouth daily.    Marland Kitchen aspirin 81 MG tablet Take 81 mg by mouth daily.    . Cholecalciferol (VITAMIN D) 2000 UNITS tablet Take 2,000 Units by mouth daily.    . Coenzyme Q10 (CO Q 10) 100 MG CAPS Take 400 mg by mouth daily.    . furosemide (LASIX) 20 MG tablet Take 1 tablet (20 mg total) by mouth daily as needed. 30 tablet 4  . lisinopril-hydrochlorothiazide (ZESTORETIC) 10-12.5 MG tablet TAKE 1 TABLET BY MOUTH EVERY DAY 90 tablet 1  . Omega-3 Fatty Acids (FISH OIL) 1000 MG CPDR Take 1 capsule by mouth daily.    . Probiotic Product (PROBIOTIC DAILY PO) Take by mouth.    . SYNTHROID 125 MCG tablet TAKE 1 TABLET (125 MCG TOTAL) BY MOUTH DAILY. SYNTHROID 30 tablet 26  . ciprofloxacin (CIPRO) 500 MG tablet Take 1 tablet (500 mg total) by mouth 2 (two) times daily. 14 tablet 0   No facility-administered medications prior to visit.    Allergies  Allergen Reactions  . Eggs Or Egg-Derived Products Shortness Of Breath and Rash  . Sulfa Antibiotics Nausea And Vomiting  . Keflex [Cephalexin] Rash    Rash   . Penicillins Rash  . Poison Ivy Extract [Poison Ivy Extract] Rash    Review of Systems  Constitutional: Positive for malaise/fatigue. Negative for fever.  HENT: Negative for congestion.   Eyes: Negative for blurred vision.  Respiratory: Negative for shortness of breath.   Cardiovascular: Negative for chest pain, palpitations and leg swelling.  Gastrointestinal: Negative for abdominal pain, blood in stool and nausea.  Genitourinary: Positive for frequency and urgency. Negative for dysuria.    Musculoskeletal: Positive for back pain. Negative for falls.  Skin: Negative for rash.  Neurological: Negative for dizziness, loss of consciousness and headaches.  Endo/Heme/Allergies: Negative for environmental allergies.  Psychiatric/Behavioral: Negative for depression. The patient is not nervous/anxious.        Objective:    Physical Exam unable to obtain via phone  BP 112/71 (BP Location: Left  Arm, Patient Position: Sitting, Cuff Size: Normal)   Temp 98.1 F (36.7 C) (Oral)   Wt 178 lb (80.7 kg)   BMI 32.56 kg/m  Wt Readings from Last 3 Encounters:  07/11/19 178 lb (80.7 kg)  07/03/19 178 lb (80.7 kg)  06/29/19 178 lb (80.7 kg)    Diabetic Foot Exam - Simple   No data filed     Lab Results  Component Value Date   WBC 8.0 06/29/2019   HGB 14.6 06/29/2019   HCT 42.6 06/29/2019   PLT 180 06/29/2019   GLUCOSE 129 (H) 06/29/2019   CHOL 147 04/04/2019   TRIG 130.0 04/04/2019   HDL 50.10 04/04/2019   LDLCALC 71 04/04/2019   ALT 24 06/29/2019   AST 30 06/29/2019   NA 133 (L) 06/29/2019   K 3.1 (L) 06/29/2019   CL 102 06/29/2019   CREATININE 0.68 06/29/2019   BUN 12 06/29/2019   CO2 21 (L) 06/29/2019   TSH 0.10 (L) 04/04/2019   INR 1.0 06/29/2019   HGBA1C 6.0 04/04/2019    Lab Results  Component Value Date   TSH 0.10 (L) 04/04/2019   Lab Results  Component Value Date   WBC 8.0 06/29/2019   HGB 14.6 06/29/2019   HCT 42.6 06/29/2019   MCV 89.1 06/29/2019   PLT 180 06/29/2019   Lab Results  Component Value Date   NA 133 (L) 06/29/2019   K 3.1 (L) 06/29/2019   CO2 21 (L) 06/29/2019   GLUCOSE 129 (H) 06/29/2019   BUN 12 06/29/2019   CREATININE 0.68 06/29/2019   BILITOT 1.0 06/29/2019   ALKPHOS 59 06/29/2019   AST 30 06/29/2019   ALT 24 06/29/2019   PROT 6.9 06/29/2019   ALBUMIN 3.8 06/29/2019   CALCIUM 8.8 (L) 06/29/2019   ANIONGAP 10 06/29/2019   GFR 67.40 04/04/2019   Lab Results  Component Value Date   CHOL 147 04/04/2019   Lab Results   Component Value Date   HDL 50.10 04/04/2019   Lab Results  Component Value Date   LDLCALC 71 04/04/2019   Lab Results  Component Value Date   TRIG 130.0 04/04/2019   Lab Results  Component Value Date   CHOLHDL 3 04/04/2019   Lab Results  Component Value Date   HGBA1C 6.0 04/04/2019       Assessment & Plan:   Problem List Items Addressed This Visit    Thyroid disease    On Levothyroxine, continue to monitor      Essential hypertension    Monitor and report any concerns.no changes to meds. Encouraged heart healthy diet such as the DASH diet and exercise as tolerated.       Vitamin D deficiency    Supplement and monitor      Urinary tract infection without hematuria    Did better on antibiotics but now notes urinary frequency and incontinence are worsening again. Sent in a prescription for Ciprofloxacin to take if symptoms worsen. She will try and get [place] a urine sample. Encouraged hydration, probiotics, cranberry tabs.      Hyperglycemia    hgba1c acceptable, minimize simple carbs. Increase exercise as tolerated.       Hyperlipidemia    maintain heart healthy diet, increase exercise, avoid trans fats, consider a krill oil cap daily       Other Visit Diagnoses    Dysuria    -  Primary   Relevant Orders   Urine culture   Urinalysis (LAB COLLECT)  I have discontinued [name] "Susan"'s ciprofloxacin. I am also having her start on ciprofloxacin. Additionally, I am having her maintain her vitamin C, Vitamin D, Co Q 10, Fish Oil, aspirin, Probiotic Product (PROBIOTIC DAILY PO), furosemide, lisinopril-hydrochlorothiazide, and Synthroid.  Meds ordered this encounter  Medications  . ciprofloxacin (CIPRO) 250 MG tablet    Sig: Take 1 tablet (250 mg total) by mouth 2 (two) times daily.    Dispense:  14 tablet    Refill:  0     I discussed the assessment and treatment plan with the patient. The patient was provided an opportunity to ask questions and all  were answered. The patient agreed with the plan and demonstrated an understanding of the instructions.   The patient was advised to call back or seek an in-person evaluation if the symptoms worsen or if the condition fails to improve as anticipated.  I provided 25 minutes of non-face-to-face time during this encounter.   Penni Homans, MD

## 2019-07-17 NOTE — Assessment & Plan Note (Signed)
Did better on antibiotics but now notes urinary frequency and incontinence are worsening again. Sent in a prescription for Ciprofloxacin to take if symptoms worsen. She will try and get [place] a urine sample. Encouraged hydration, probiotics, cranberry tabs.

## 2019-07-17 NOTE — Assessment & Plan Note (Signed)
Monitor and report any concerns. no changes to meds. Encouraged heart healthy diet such as the DASH diet and exercise as tolerated.  

## 2019-07-17 NOTE — Assessment & Plan Note (Signed)
On Levothyroxine, continue to monitor 

## 2019-07-17 NOTE — Assessment & Plan Note (Signed)
Supplement and monitor 

## 2019-07-31 ENCOUNTER — Encounter: Payer: Self-pay | Admitting: Family Medicine

## 2019-08-05 ENCOUNTER — Ambulatory Visit: Payer: Medicare HMO

## 2019-08-05 ENCOUNTER — Ambulatory Visit: Payer: Medicare HMO | Attending: Internal Medicine

## 2019-08-05 DIAGNOSIS — Z23 Encounter for immunization: Secondary | ICD-10-CM | POA: Insufficient documentation

## 2019-08-05 NOTE — Progress Notes (Signed)
   Covid-19 Vaccination Clinic  Name:  Felicia Lowe    MRN: [id number] DOB: 06/11/1941  08/05/2019  Felicia Lowe was observed post Covid-19 immunization for 15 minutes without incidence. She was provided with Vaccine Information Sheet and instruction to access the V-Safe system.   Felicia Lowe was instructed to call 911 with any severe reactions post vaccine: Marland Kitchen Difficulty breathing  . Swelling of your face and throat  . A fast heartbeat  . A bad rash all over your body  . Dizziness and weakness    Immunizations Administered    Name Date Dose VIS Date Route   Pfizer COVID-19 Vaccine 08/05/2019 11:51 AM 0.3 mL 06/16/2019 Intramuscular   Manufacturer: [place]   Lot: [id number]   [place]: [id number]

## 2019-08-07 ENCOUNTER — Encounter: Payer: Self-pay | Admitting: Family Medicine

## 2019-09-18 ENCOUNTER — Encounter: Payer: Self-pay | Admitting: Family Medicine

## 2019-09-21 ENCOUNTER — Telehealth: Payer: Self-pay | Admitting: Family Medicine

## 2019-09-21 DIAGNOSIS — M545 Low back pain, unspecified: Secondary | ICD-10-CM

## 2019-09-21 DIAGNOSIS — R2681 Unsteadiness on feet: Secondary | ICD-10-CM

## 2019-09-21 DIAGNOSIS — M1990 Unspecified osteoarthritis, unspecified site: Secondary | ICD-10-CM

## 2019-09-21 DIAGNOSIS — R531 Weakness: Secondary | ICD-10-CM

## 2019-09-21 NOTE — Telephone Encounter (Signed)
Pt's spouse came in office and dropped of document for provider to fill out (Application for Disability Placard - 3 pages- documents in big yellow envelope ) Pt would like document to be mailed out to her when document ready in the envelope attached to her documents. Document put at front office tray under provider's name.

## 2019-09-25 NOTE — Telephone Encounter (Signed)
Rx should be printed at Dr. Rhae Lerner printer.  Please get Dr. Rhae Lerner signature.

## 2019-09-26 NOTE — Telephone Encounter (Signed)
Prescription completed. Per patient request, mailed Rx in provided stamped envelope to [facility] [employer] [employer] [facility].  Patients spouse notified.

## 2019-10-19 ENCOUNTER — Encounter: Payer: Self-pay | Admitting: Family Medicine

## 2019-10-19 DIAGNOSIS — R5383 Other fatigue: Secondary | ICD-10-CM

## 2019-10-25 NOTE — Progress Notes (Signed)
Nurse connected with patient 10/26/19 at 10:15 AM EDT by a telephone enabled telemedicine application and verified that I am speaking with the correct person using two identifiers. Patient stated full name and DOB. Patient gave permission to continue with virtual visit. Patient's location was at home and Nurse's location was at [place] office.   Subjective:   Felicia Lowe is a 79 y.o. female who presents for Medicare Annual (Subsequent) preventive examination.  Plays piano and writes books.  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 Her and spouse are cautious and ALWAYS drink bottled water.  Review of Systems:  Home Safety/Smoke Alarms: Feels safe in home. Smoke alarms in place.  Lives w/ husband in 1 story home.   Female:        Mammo-declines       Dexa scan-11/08/17       CCS-Cologuard 10/30/16- neg    Objective:     Vitals: Unable to assess. This visit is enabled though telemedicine due to Covid 19.   Advanced Directives 10/26/2019 06/29/2019 10/24/2018 10/20/2017 10/19/2016 11/15/2014  Does Patient Have a Medical Advance Directive? No No No No Yes No  Does patient want to make changes to medical advance directive? - - - Yes (MAU/Ambulatory/Procedural Areas - Information given) Yes (MAU/Ambulatory/Procedural Areas - Information given) -  Would patient like information on creating a medical advance directive? No - Patient declined Yes (ED - Information included in AVS) No - Patient declined - - Yes - Scientist, clinical (histocompatibility and immunogenetics) given    Tobacco Social History   Tobacco Use  Smoking Status Never Smoker  Smokeless Tobacco Never Used     Counseling given: Not Answered   Clinical Intake: Pain : No/denies pain     Past Medical History:  Diagnosis Date  . Allergy   . Arthritis    osteoarthritis, severe, b/l knees &b/l hips  replaced  . BPV (benign positional vertigo) 02/19/2013   mild  . Chicken pox as a child  .  GERD (gastroesophageal reflux disease) 05/27/2014  . H/O breast biopsy    benign, on left  . Hypertension   . Kidney stone 2000   nephritis as a child  . Measles as a child  . Mumps as a child  . Obesity   . Osteoporosis   . Preventative health care 11/25/2014  . Pruritus 01/21/2017  . Scabies   . Skin lesion of face 05/18/2012  . Thyroid disease   . Unsteady gait 11/25/2014  . UTI (urinary tract infection) 12/11/2012  . Viral respiratory illness 05/21/2013  . Vitamin D deficiency    Past Surgical History:  Procedure Laterality Date  . CATARACT EXTRACTION Left 2017  . EYE SURGERY Left 2019   cataract removal left  . HIP SURGERY     both hips  . KNEE SURGERY     both knees replaced   Family History  Problem Relation Age of Onset  . Hyperlipidemia Mother   . Hypertension Mother   . Heart attack Mother   . Cancer Father        lung- smoker  . Heart attack Father        X 2  . Thyroid disease Brother   . Hypertension Son   . Rashes / Skin problems Son   . Hernia Son        umbilical  . Cancer Paternal Grandmother   . Macular degeneration Brother    Social History  Socioeconomic History  . Marital status: Married    Spouse name: Not on file  . Number of children: Not on file  . Years of education: Not on file  . Highest education level: Not on file  Occupational History  . Not on file  Tobacco Use  . Smoking status: Never Smoker  . Smokeless tobacco: Never Used  Substance and Sexual Activity  . Alcohol use: Yes    Comment: Occasional glass of wine  . Drug use: No  . Sexual activity: Never  Other Topics Concern  . Not on file  Social History Narrative  . Not on file   Social Determinants of Health   Financial Resource Strain: Low Risk   . Difficulty of Paying Living Expenses: Not hard at all  Food Insecurity: No Food Insecurity  . Worried About Charity fundraiser in the Last Year: Never true  . Ran Out of Food in the Last Year: Never true    Transportation Needs: No Transportation Needs  . Lack of Transportation (Medical): No  . Lack of Transportation (Non-Medical): No  Physical Activity:   . Days of Exercise per Week:   . Minutes of Exercise per Session:   Stress:   . Feeling of Stress :   Social Connections:   . Frequency of Communication with Friends and Family:   . Frequency of Social Gatherings with Friends and Family:   . Attends Religious Services:   . Active Member of Clubs or Organizations:   . Attends Archivist Meetings:   Marland Kitchen Marital Status:     Outpatient Encounter Medications as of 10/26/2019  Medication Sig  . Ascorbic Acid (VITAMIN C) 1000 MG tablet Take 1,000 mg by mouth daily.  Marland Kitchen aspirin 81 MG tablet Take 81 mg by mouth daily.  . Cholecalciferol (VITAMIN D) 2000 UNITS tablet Take 2,000 Units by mouth daily.  . Coenzyme Q10 (CO Q 10) 100 MG CAPS Take 400 mg by mouth daily.  Marland Kitchen lisinopril-hydrochlorothiazide (ZESTORETIC) 10-12.5 MG tablet TAKE 1 TABLET BY MOUTH EVERY DAY  . Omega-3 Fatty Acids (FISH OIL) 1000 MG CPDR Take 1 capsule by mouth daily.  . Probiotic Product (PROBIOTIC DAILY PO) Take by mouth.  . SYNTHROID 125 MCG tablet TAKE 1 TABLET (125 MCG TOTAL) BY MOUTH DAILY. SYNTHROID  . furosemide (LASIX) 20 MG tablet Take 1 tablet (20 mg total) by mouth daily as needed. (Patient not taking: Reported on 10/26/2019)  . [DISCONTINUED] ciprofloxacin (CIPRO) 250 MG tablet Take 1 tablet (250 mg total) by mouth 2 (two) times daily.   No facility-administered encounter medications on file as of 10/26/2019.    Activities of Daily Living In your present state of health, do you have any difficulty performing the following activities: 10/26/2019  Hearing? N  Vision? N  Difficulty concentrating or making decisions? N  Walking or climbing stairs? N  Dressing or bathing? N  Doing errands, shopping? N  Preparing Food and eating ? N  Using the Toilet? N  In the past six months, have you accidently leaked  urine? N  Do you have problems with loss of bowel control? N  Managing your Medications? N  Managing your Finances? N  Housekeeping or managing your Housekeeping? N  Some recent data might be hidden    Patient Care Team: Mosie Lukes, MD as PCP - General (Family Medicine) Stephannie Li, [place] as Consulting Physician (Ophthalmology) Valinda Party, MD as Consulting Physician (Rheumatology) Harold Hedge, Darrick Grinder, MD as Consulting  Physician (Allergy and Immunology)    Assessment:   This is a routine wellness examination for [place]. Physical assessment deferred to PCP.  Exercise Activities and Dietary recommendations Current Exercise Habits: Home exercise routine, Type of exercise: yoga;walking, Time (Minutes): 20, Frequency (Times/Week): 3, Weekly Exercise (Minutes/Week): 60, Intensity: Mild, Exercise limited by: None identified Diet (meal preparation, eat out, water intake, caffeinated beverages, dairy products, fruits and vegetables): well balanced   Goals    . Increase physical activity       Fall Risk Fall Risk  10/26/2019 10/24/2018 10/20/2017 10/19/2016 03/06/2016  Falls in the past year? 0 0 Yes No No  Number falls in past yr: 0 - 2 or more - -  Injury with Fall? 0 - No - -  Follow up Education provided;Falls prevention discussed - Education provided;Falls prevention discussed - -   Depression Screen PHQ 2/9 Scores 10/26/2019 10/24/2018 10/20/2017 10/19/2016  PHQ - 2 Score 0 0 0 0     Cognitive Function Ad8 score reviewed for issues:  Issues making decisions:no  Less interest in hobbies / activities:no  Repeats questions, stories (family complaining):no  Trouble using ordinary gadgets (microwave, computer, phone):no  Forgets the month or year: no  Mismanaging finances: no  Remembering appts:no  Daily problems with thinking and/or memory:no Ad8 score is=0     MMSE - Mini Mental State Exam 10/19/2016  Orientation to time 5  Orientation to Place 5  Registration  3  Attention/ Calculation 5  Recall 3  Language- name 2 objects 2  Language- repeat 1  Language- follow 3 step command 3  Language- read & follow direction 1  Write a sentence 1  Copy design 0  Total score 29        Immunization History  Administered Date(s) Administered  . Influenza Inj Mdck Quad Pf 04/07/2019  . Influenza, Quadrivalent, Recombinant, Inj, Pf 05/15/2016, 05/25/2017, 05/30/2018  . Influenza,trivalent, recombinat, inj, PF 05/17/2014, 05/07/2015  . PFIZER SARS-COV-2 Vaccination 07/15/2019, 08/05/2019  . Pneumococcal Conjugate-13 03/06/2016  . Pneumococcal Polysaccharide-23 07/06/2010  . Td 03/06/2016  . Tdap 07/06/2001  . Zoster 04/06/2011  . Zoster Recombinat (Shingrix) 08/23/2018    Screening Tests Health Maintenance  Topic Date Due  . INFLUENZA VACCINE  02/04/2020  . TETANUS/TDAP  03/06/2026  . DEXA SCAN  Completed  . COVID-19 Vaccine  Completed  . PNA vac Low Risk Adult  Completed      Plan:   See you next year!  Continue to eat heart healthy diet (full of fruits, vegetables, whole grains, lean protein, water--limit salt, fat, and sugar intake) and increase physical activity as tolerated.  Continue doing brain stimulating activities (puzzles, reading, adult coloring books, staying active) to keep memory sharp.     I have personally reviewed and noted the following in the patient's chart:   . Medical and social history . Use of alcohol, tobacco or illicit drugs  . Current medications and supplements . Functional ability and status . Nutritional status . Physical activity . Advanced directives . List of other physicians . Hospitalizations, surgeries, and ER visits in previous 12 months . Vitals . Screenings to include cognitive, depression, and falls . Referrals and appointments  In addition, I have reviewed and discussed with patient certain preventive protocols, quality metrics, and best practice recommendations. A written personalized  care plan for preventive services as well as general preventive health recommendations were provided to patient.     Felicia Lowe [place], [place]  10/26/2019

## 2019-10-26 ENCOUNTER — Other Ambulatory Visit: Payer: Self-pay

## 2019-10-26 ENCOUNTER — Encounter: Payer: Self-pay | Admitting: *Deleted

## 2019-10-26 ENCOUNTER — Ambulatory Visit (INDEPENDENT_AMBULATORY_CARE_PROVIDER_SITE_OTHER): Payer: Medicare HMO | Admitting: *Deleted

## 2019-10-26 DIAGNOSIS — Z0000 Encounter for general adult medical examination without abnormal findings: Secondary | ICD-10-CM

## 2019-10-26 NOTE — Patient Instructions (Signed)
See you next year!  Continue to eat heart healthy diet (full of fruits, vegetables, whole grains, lean protein, water--limit salt, fat, and sugar intake) and increase physical activity as tolerated.  Continue doing brain stimulating activities (puzzles, reading, adult coloring books, staying active) to keep memory sharp.    Felicia Lowe , Thank you for taking time to come for your Medicare Wellness Visit. I appreciate your ongoing commitment to your health goals. Please review the following plan we discussed and let me know if I can assist you in the future.   These are the goals we discussed: Goals    . Increase physical activity       This is a list of the screening recommended for you and due dates:  Health Maintenance  Topic Date Due  . Flu Shot  02/04/2020  . Tetanus Vaccine  03/06/2026  . DEXA scan (bone density measurement)  Completed  . COVID-19 Vaccine  Completed  . Pneumonia vaccines  Completed    Preventive Care 66 Years and Older, Female Preventive care refers to lifestyle choices and visits with your health care provider that can promote health and wellness. This includes:  A yearly physical exam. This is also called an annual well check.  Regular dental and eye exams.  Immunizations.  Screening for certain conditions.  Healthy lifestyle choices, such as diet and exercise. What can I expect for my preventive care visit? Physical exam Your health care provider will check:  Height and weight. These may be used to calculate body mass index (BMI), which is a measurement that tells if you are at a healthy weight.  Heart rate and blood pressure.  Your skin for abnormal spots. Counseling Your health care provider may ask you questions about:  Alcohol, tobacco, and drug use.  Emotional well-being.  Home and relationship well-being.  Sexual activity.  Eating habits.  History of falls.  Memory and ability to understand (cognition).  Work and work  Statistician.  Pregnancy and menstrual history. What immunizations do I need?  Influenza (flu) vaccine  This is recommended every year. Tetanus, diphtheria, and pertussis (Tdap) vaccine  You may need a Td booster every 10 years. Varicella (chickenpox) vaccine  You may need this vaccine if you have not already been vaccinated. Zoster (shingles) vaccine  You may need this after age 99. Pneumococcal conjugate (PCV13) vaccine  One dose is recommended after age 45. Pneumococcal polysaccharide (PPSV23) vaccine  One dose is recommended after age 32. Measles, mumps, and rubella (MMR) vaccine  You may need at least one dose of MMR if you were born in 1957 or later. You may also need a second dose. Meningococcal conjugate (MenACWY) vaccine  You may need this if you have certain conditions. Hepatitis A vaccine  You may need this if you have certain conditions or if you travel or work in places where you may be exposed to hepatitis A. Hepatitis B vaccine  You may need this if you have certain conditions or if you travel or work in places where you may be exposed to hepatitis B. Haemophilus influenzae type b (Hib) vaccine  You may need this if you have certain conditions. You may receive vaccines as individual doses or as more than one vaccine together in one shot (combination vaccines). Talk with your health care provider about the risks and benefits of combination vaccines. What tests do I need? Blood tests  Lipid and cholesterol levels. These may be checked every 5 years, or more frequently depending  on your overall health.  Hepatitis C test.  Hepatitis B test. Screening  Lung cancer screening. You may have this screening every year starting at age 46 if you have a 30-pack-year history of smoking and currently smoke or have quit within the past 15 years.  Colorectal cancer screening. All adults should have this screening starting at age 58 and continuing until age 49. Your  health care provider may recommend screening at age 56 if you are at increased risk. You will have tests every 1-10 years, depending on your results and the type of screening test.  Diabetes screening. This is done by checking your blood sugar (glucose) after you have not eaten for a while (fasting). You may have this done every 1-3 years.  Mammogram. This may be done every 1-2 years. Talk with your health care provider about how often you should have regular mammograms.  BRCA-related cancer screening. This may be done if you have a family history of breast, ovarian, tubal, or peritoneal cancers. Other tests  Sexually transmitted disease (STD) testing.  Bone density scan. This is done to screen for osteoporosis. You may have this done starting at age 62. Follow these instructions at home: Eating and drinking  Eat a diet that includes fresh fruits and vegetables, whole grains, lean protein, and low-fat dairy products. Limit your intake of foods with high amounts of sugar, saturated fats, and salt.  Take vitamin and mineral supplements as recommended by your health care provider.  Do not drink alcohol if your health care provider tells you not to drink.  If you drink alcohol: ? Limit how much you have to 0-1 drink a day. ? Be aware of how much alcohol is in your drink. In the [place] one drink equals one 12 oz bottle of beer (355 mL), one 5 oz glass of wine (148 mL), or one 1 oz glass of hard liquor (44 mL). Lifestyle  Take daily care of your teeth and gums.  Stay active. Exercise for at least 30 minutes on 5 or more days each week.  Do not use any products that contain nicotine or tobacco, such as cigarettes, e-cigarettes, and chewing tobacco. If you need help quitting, ask your health care provider.  If you are sexually active, practice safe sex. Use a condom or other form of protection in order to prevent STIs (sexually transmitted infections).  Talk with your health care provider  about taking a low-dose aspirin or statin. What's next?  Go to your health care provider once a year for a well check visit.  Ask your health care provider how often you should have your eyes and teeth checked.  Stay up to date on all vaccines. This information is not intended to replace advice given to you by your health care provider. Make sure you discuss any questions you have with your health care provider. Document Revised: 06/16/2018 Document Reviewed: 06/16/2018 Elsevier Patient Education  2020 [employer].

## 2019-10-26 NOTE — Telephone Encounter (Signed)
Ordered future labs. Left message with pt's spouse to have pt return my call to schedule a lab appt.

## 2019-10-27 ENCOUNTER — Other Ambulatory Visit: Payer: Self-pay

## 2019-10-27 ENCOUNTER — Other Ambulatory Visit (INDEPENDENT_AMBULATORY_CARE_PROVIDER_SITE_OTHER): Payer: Medicare HMO

## 2019-10-27 ENCOUNTER — Encounter: Payer: Self-pay | Admitting: Family Medicine

## 2019-10-27 DIAGNOSIS — R5383 Other fatigue: Secondary | ICD-10-CM

## 2019-10-27 LAB — COMPREHENSIVE METABOLIC PANEL
ALT: 9 U/L (ref 0–35)
AST: 11 U/L (ref 0–37)
Albumin: 4.1 g/dL (ref 3.5–5.2)
Alkaline Phosphatase: 63 U/L (ref 39–117)
BUN: 16 mg/dL (ref 6–23)
CO2: 26 mEq/L (ref 19–32)
Calcium: 9.4 mg/dL (ref 8.4–10.5)
Chloride: 106 mEq/L (ref 96–112)
Creatinine, Ser: 0.8 mg/dL (ref 0.40–1.20)
GFR: 69.25 mL/min (ref >60.00)
Glucose, Bld: 110 mg/dL — ABNORMAL HIGH (ref 70–99)
Potassium: 4 mEq/L (ref 3.5–5.1)
Sodium: 142 mEq/L (ref 135–145)
Total Bilirubin: 0.4 mg/dL (ref 0.2–1.2)
Total Protein: 6.6 g/dL (ref 6.0–8.3)

## 2019-10-27 LAB — CBC WITH DIFFERENTIAL/PLATELET
Basophils Absolute: 0 10*3/uL (ref 0.0–0.1)
Basophils Relative: 0.7 % (ref 0.0–3.0)
Eosinophils Absolute: 0.2 10*3/uL (ref 0.0–0.7)
Eosinophils Relative: 4 % (ref 0.0–5.0)
HCT: 42.7 % (ref 36.0–46.0)
Hemoglobin: 14.3 g/dL (ref 12.0–15.0)
Lymphocytes Relative: 22.4 % (ref 12.0–46.0)
Lymphs Abs: 1.1 10*3/uL (ref 0.7–4.0)
MCHC: 33.5 g/dL (ref 30.0–36.0)
MCV: 91.4 fl (ref 78.0–100.0)
Monocytes Absolute: 0.6 10*3/uL (ref 0.1–1.0)
Monocytes Relative: 10.9 % (ref 3.0–12.0)
Neutro Abs: 3.1 10*3/uL (ref 1.4–7.7)
Neutrophils Relative %: 62 % (ref 43.0–77.0)
Platelets: 279 10*3/uL (ref 150.0–400.0)
RBC: 4.67 Mil/uL (ref 3.87–5.11)
RDW: 13.8 % (ref 11.5–15.5)
WBC: 5.1 10*3/uL (ref 4.0–10.5)

## 2019-10-27 LAB — T4, FREE: Free T4: 1.13 ng/dL (ref 0.60–1.60)

## 2019-10-27 LAB — TSH: TSH: 1.62 u[IU]/mL (ref 0.35–4.50)

## 2019-12-20 ENCOUNTER — Other Ambulatory Visit: Payer: Self-pay | Admitting: *Deleted

## 2019-12-20 MED ORDER — LISINOPRIL-HYDROCHLOROTHIAZIDE 10-12.5 MG PO TABS: 1.0000 | ORAL_TABLET | Freq: Every day | ORAL | 1 refills | Status: DC

## 2020-06-18 ENCOUNTER — Other Ambulatory Visit: Payer: Self-pay | Admitting: Family Medicine

## 2020-07-06 NOTE — Progress Notes (Addendum)
[facility] [place], [place], [place] [address] [phone]  Date:  07/08/2020   Name:  Felicia Lowe   DOB:  02-Aug-1940   MRN:  [id number]  PCP:  Mosie Lukes, MD    Chief Complaint: Annual Exam   History of Present Illness:  Felicia Lowe is a 80 y.o. very pleasant female patient who presents with the following:  Patient of Dr. Charlett Blake, here today for a physical exam I have seen her once previously, December 2020-virtual visit for illness History of prediabetes, hypothyroidism, hypertension, Fuchs corneal dystrophy  Pt notes that overall she is feeling well- there are days that she feels tired   Hepatitis C screening- do today  COVID-19 booster done Flu shot done Pneumonia vaccines are up-to-date Can offer DEXA scan, most recently done 2019- she declines for now Mammogram- pt declines, I encouraged her to do this  Most recent colon cancer screening 2006, but she did do cologaurd in 2018- she would like to repeat this now, will order  Most recent labs April of this year-it looks like her thyroid medication was adjusted at the end of 2020, most recent TSH in range  Aspirin 81 Vitamin D Lisinopril HCTZ Synthroid  Patient overall feels well, she is trying to lose weight but it is a challenge.  She stays active by walking, she has some difficulty with her gait still from previous hip and knee replacements  Wt Readings from Last 3 Encounters:  07/08/20 177 lb (80.3 kg)  07/11/19 178 lb (80.7 kg)  07/03/19 178 lb (80.7 kg)    Patient Active Problem List   Diagnosis Date Noted  . Low back pain 04/04/2019  . Hyperlipidemia 05/30/2018  . Osteopenia 05/30/2018  . 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 Hyperglycemia 05/25/2017  . Atypical chest pain 04/04/2017  . Pedal edema 04/04/2017  . Preventative health care 11/25/2014  . Unsteady gait 11/25/2014  . Dizziness 09/10/2014  . GERD (gastroesophageal  reflux disease) 05/27/2014  . Dermatitis 01/21/2014  . Allergic drug rash 03/17/2013  . Intertrigo 03/17/2013  . BPV (benign positional vertigo) 02/19/2013  . Urinary tract infection without hematuria 12/11/2012  . Medicare annual wellness visit, subsequent 02/08/2012  . Thyroid disease   . Essential hypertension   . Vitamin D deficiency   . Mumps   . Kidney stone   . Obesity   . Allergic state   . Arthritis   . H/O breast biopsy     Past Medical History:  Diagnosis Date  . Allergy   . Arthritis    osteoarthritis, severe, b/l knees &b/l hips  replaced  . BPV (benign positional vertigo) 02/19/2013   mild  . Chicken pox as a child  . GERD (gastroesophageal reflux disease) 05/27/2014  . H/O breast biopsy    benign, on left  . Hypertension   . Kidney stone 2000   nephritis as a child  . Measles as a child  . Mumps as a child  . Obesity   . Osteoporosis   . Preventative health care 11/25/2014  . Pruritus 01/21/2017  . Scabies   . Skin lesion of face 05/18/2012  . Thyroid disease   . Unsteady gait 11/25/2014  . UTI (urinary tract infection) 12/11/2012  . Viral respiratory illness 05/21/2013  . Vitamin D deficiency     Past Surgical History:  Procedure Laterality Date  . CATARACT EXTRACTION Left 2017  . EYE SURGERY Left 2019  cataract removal left  . HIP SURGERY     both hips  . KNEE SURGERY     both knees replaced    Social History   Tobacco Use  . Smoking status: Never Smoker  . Smokeless tobacco: Never Used  Vaping Use  . Vaping Use: Never used  Substance Use Topics  . Alcohol use: Yes    Comment: Occasional glass of wine  . Drug use: No    Family History  Problem Relation Age of Onset  . Hyperlipidemia Mother   . Hypertension Mother   . Heart attack Mother   . Cancer Father        lung- smoker  . Heart attack Father        X 2  . Thyroid disease Brother   . Hypertension Son   . Rashes / Skin problems Son   . Hernia Son        umbilical  .  Cancer Paternal Grandmother   . Macular degeneration Brother     Allergies  Allergen Reactions  . Eggs Or Egg-Derived Products Shortness Of Breath and Rash  . Sulfa Antibiotics Nausea And Vomiting  . Keflex [Cephalexin] Rash    Rash   . Penicillins Rash  . Poison Ivy Extract [Poison Ivy Extract] Rash    Medication list has been reviewed and updated.  Current Outpatient Medications on File Prior to Visit  Medication Sig Dispense Refill  . Ascorbic Acid (VITAMIN C) 1000 MG tablet Take 1,000 mg by mouth daily.    Marland Kitchen aspirin 81 MG tablet Take 81 mg by mouth daily.    . Cholecalciferol (VITAMIN D) 2000 UNITS tablet Take 2,000 Units by mouth daily.    . Coenzyme Q10 (CO Q 10) 100 MG CAPS Take 400 mg by mouth daily.    Marland Kitchen lisinopril-hydrochlorothiazide (ZESTORETIC) 10-12.5 MG tablet TAKE 1 TABLET BY MOUTH EVERY DAY 90 tablet 0  . Omega-3 Fatty Acids (FISH OIL) 1000 MG CPDR Take 1 capsule by mouth daily.    . Probiotic Product (PROBIOTIC DAILY PO) Take by mouth.    . SYNTHROID 125 MCG tablet TAKE 1 TABLET (125 MCG TOTAL) BY MOUTH DAILY. SYNTHROID 30 tablet 26   No current facility-administered medications on file prior to visit.    Review of Systems:  As per HPI- otherwise negative.   Physical Examination: Vitals:   07/08/20 0922  BP: 126/80  Pulse: 88  Resp: 17  SpO2: 96%   Vitals:   07/08/20 0922  Weight: 177 lb (80.3 kg)  Height: 5\' 2"  (1.575 m)   Body mass index is 32.37 kg/m. Ideal Body Weight: Weight in (lb) to have BMI = 25: 136.4  GEN: no acute distress.  Mild obesity, looks well HEENT: Atraumatic, Normocephalic.   Bilateral TM wnl,  PEERL,EOMI.   Ears and Nose: No external deformity. CV: RRR, No M/G/R. No JVD. No thrill. No extra heart sounds. PULM: CTA B, no wheezes, crackles, rhonchi. No retractions. No resp. distress. No accessory muscle use. ABD: S, NT, ND, +BS. No rebound. No HSM. EXTR: No c/c.  Minimal bilateral LE edema- it does not bother her  PSYCH:  Normally interactive. Conversant.    Assessment and Plan: Encounter for hepatitis C screening test for low risk patient - Plan: Hepatitis C antibody  Essential hypertension - Plan: CBC, lisinopril-hydrochlorothiazide (ZESTORETIC) 10-12.5 MG tablet  Prediabetes - Plan: Comprehensive metabolic panel, Hemoglobin A1c  Hyperlipidemia, unspecified hyperlipidemia type - Plan: Lipid panel  Thyroid disease - Plan: TSH  Screening for colon cancer  Is here today for follow-up visit Blood pressure well controlled, refilled medication Labs pending as above follow-up of hyperlipidemia, hypothyroidism, prediabetes Order Cologuard She plans to get her second dose of Shingrix I did counsel her that breast cancer screening is still indicated for her, and glad to order this and a bone density whenever she would like  .Will plan further follow- up pending labs.  This visit occurred during the SARS-CoV-2 public health emergency.  Safety protocols were in place, including screening questions prior to the visit, additional usage of staff PPE, and extensive cleaning of exam room while observing appropriate contact time as indicated for disinfecting solutions.    Signed Lamar Blinks, MD  Received her labs 1/4- message to pt  Results for orders placed or performed in visit on 07/08/20  CBC  Result Value Ref Range   WBC 4.9 4.0 - 10.5 K/uL   RBC 4.63 3.87 - 5.11 Mil/uL   Platelets 267.0 150.0 - 400.0 K/uL   Hemoglobin 14.1 12.0 - 15.0 g/dL   HCT 41.9 36.0 - 46.0 %   MCV 90.5 78.0 - 100.0 fl   MCHC 33.6 30.0 - 36.0 g/dL   RDW 13.8 11.5 - 15.5 %  Comprehensive metabolic panel  Result Value Ref Range   Sodium 140 135 - 145 mEq/L   Potassium 4.3 3.5 - 5.1 mEq/L   Chloride 105 96 - 112 mEq/L   CO2 27 19 - 32 mEq/L   Glucose, Bld 88 70 - 99 mg/dL   BUN 17 6 - 23 mg/dL   Creatinine, Ser 0.81 0.40 - 1.20 mg/dL   Total Bilirubin 0.5 0.2 - 1.2 mg/dL   Alkaline Phosphatase 51 39 - 117 U/L   AST  12 0 - 37 U/L   ALT 10 0 - 35 U/L   Total Protein 6.6 6.0 - 8.3 g/dL   Albumin 4.3 3.5 - 5.2 g/dL   GFR 69.05 >60.00 mL/min   Calcium 9.5 8.4 - 10.5 mg/dL  Hemoglobin A1c  Result Value Ref Range   Hgb A1c MFr Bld 5.7 4.6 - 6.5 %  Lipid panel  Result Value Ref Range   Cholesterol 158 0 - 200 mg/dL   Triglycerides 142.0 0.0 - 149.0 mg/dL   HDL 49.10 >39.00 mg/dL   VLDL 28.4 0.0 - 40.0 mg/dL   LDL Cholesterol 80 0 - 99 mg/dL   Total CHOL/HDL Ratio 3    NonHDL 108.85   TSH  Result Value Ref Range   TSH 1.17 0.35 - 4.50 uIU/mL

## 2020-07-06 NOTE — Patient Instructions (Incomplete)
It was nice to see you today!  I will be in touch with your labs asap I will refill your thyroid med once your TSH comes in  I would encourage you to get a 2nd dose of shingles vaccine at your pharmacy at your convenience- Shingrix I would also encourage you to get a mammogram and bone density test at your convenience   We will get a cologuard kit ordered for you to do at home    Health Maintenance After Age 80 After age 44, you are at a higher risk for certain long-term diseases and infections as well as injuries from falls. Falls are a major cause of broken bones and head injuries in people who are older than age 2. Getting regular preventive care can help to keep you healthy and well. Preventive care includes getting regular testing and making lifestyle changes as recommended by your health care provider. Talk with your health care provider about:  Which screenings and tests you should have. A screening is a test that checks for a disease when you have no symptoms.  A diet and exercise plan that is right for you. What should I know about screenings and tests to prevent falls? Screening and testing are the best ways to find a health problem early. Early diagnosis and treatment give you the best chance of managing medical conditions that are common after age 27. Certain conditions and lifestyle choices may make you more likely to have a fall. Your health care provider may recommend:  Regular vision checks. Poor vision and conditions such as cataracts can make you more likely to have a fall. If you wear glasses, make sure to get your prescription updated if your vision changes.  Medicine review. Work with your health care provider to regularly review all of the medicines you are taking, including over-the-counter medicines. Ask your health care provider about any side effects that may make you more likely to have a fall. Tell your health care provider if any medicines that you take make you feel  dizzy or sleepy.  Osteoporosis screening. Osteoporosis is a condition that causes the bones to get weaker. This can make the bones weak and cause them to break more easily.  Blood pressure screening. Blood pressure changes and medicines to control blood pressure can make you feel dizzy.  Strength and balance checks. Your health care provider may recommend certain tests to check your strength and balance while standing, walking, or changing positions.  Foot health exam. Foot pain and numbness, as well as not wearing proper footwear, can make you more likely to have a fall.  Depression screening. You may be more likely to have a fall if you have a fear of falling, feel emotionally low, or feel unable to do activities that you used to do.  Alcohol use screening. Using too much alcohol can affect your balance and may make you more likely to have a fall. What actions can I take to lower my risk of falls? General instructions  Talk with your health care provider about your risks for falling. Tell your health care provider if: ? You fall. Be sure to tell your health care provider about all falls, even ones that seem minor. ? You feel dizzy, sleepy, or off-balance.  Take over-the-counter and prescription medicines only as told by your health care provider. These include any supplements.  Eat a healthy diet and maintain a healthy weight. A healthy diet includes low-fat dairy products, low-fat (lean) meats, and fiber  from whole grains, beans, and lots of fruits and vegetables. Home safety  Remove any tripping hazards, such as rugs, cords, and clutter.  Install safety equipment such as grab bars in bathrooms and safety rails on stairs.  Keep rooms and walkways well-lit. Activity   Follow a regular exercise program to stay fit. This will help you maintain your balance. Ask your health care provider what types of exercise are appropriate for you.  If you need a cane or walker, use it as  recommended by your health care provider.  Wear supportive shoes that have nonskid soles. Lifestyle  Do not drink alcohol if your health care provider tells you not to drink.  If you drink alcohol, limit how much you have: ? 0-1 drink a day for women. ? 0-2 drinks a day for men.  Be aware of how much alcohol is in your drink. In the [place] one drink equals one typical bottle of beer (12 oz), one-half glass of wine (5 oz), or one shot of hard liquor (1 oz).  Do not use any products that contain nicotine or tobacco, such as cigarettes and e-cigarettes. If you need help quitting, ask your health care provider. Summary  Having a healthy lifestyle and getting preventive care can help to protect your health and wellness after age 68.  Screening and testing are the best way to find a health problem early and help you avoid having a fall. Early diagnosis and treatment give you the best chance for managing medical conditions that are more common for people who are older than age 70.  Falls are a major cause of broken bones and head injuries in people who are older than age 77. Take precautions to prevent a fall at home.  Work with your health care provider to learn what changes you can make to improve your health and wellness and to prevent falls. This information is not intended to replace advice given to you by your health care provider. Make sure you discuss any questions you have with your health care provider. Document Revised: 10/13/2018 Document Reviewed: 05/05/2017 Elsevier Patient Education  2020 [employer].

## 2020-07-08 ENCOUNTER — Encounter: Payer: Self-pay | Admitting: Family Medicine

## 2020-07-08 ENCOUNTER — Ambulatory Visit (INDEPENDENT_AMBULATORY_CARE_PROVIDER_SITE_OTHER): Payer: Medicare HMO | Admitting: Family Medicine

## 2020-07-08 ENCOUNTER — Other Ambulatory Visit: Payer: Self-pay

## 2020-07-08 VITALS — BP 126/80 | HR 88 | Resp 17 | Ht 62.0 in | Wt 177.0 lb

## 2020-07-08 DIAGNOSIS — E079 Disorder of thyroid, unspecified: Secondary | ICD-10-CM | POA: Diagnosis not present

## 2020-07-08 DIAGNOSIS — I10 Essential (primary) hypertension: Secondary | ICD-10-CM

## 2020-07-08 DIAGNOSIS — Z1159 Encounter for screening for other viral diseases: Secondary | ICD-10-CM

## 2020-07-08 DIAGNOSIS — R7303 Prediabetes: Secondary | ICD-10-CM

## 2020-07-08 DIAGNOSIS — Z1211 Encounter for screening for malignant neoplasm of colon: Secondary | ICD-10-CM

## 2020-07-08 DIAGNOSIS — E785 Hyperlipidemia, unspecified: Secondary | ICD-10-CM | POA: Diagnosis not present

## 2020-07-08 LAB — CBC
HCT: 41.9 % (ref 36.0–46.0)
Hemoglobin: 14.1 g/dL (ref 12.0–15.0)
MCHC: 33.6 g/dL (ref 30.0–36.0)
MCV: 90.5 fl (ref 78.0–100.0)
Platelets: 267 10*3/uL (ref 150.0–400.0)
RBC: 4.63 Mil/uL (ref 3.87–5.11)
RDW: 13.8 % (ref 11.5–15.5)
WBC: 4.9 10*3/uL (ref 4.0–10.5)

## 2020-07-08 LAB — COMPREHENSIVE METABOLIC PANEL
ALT: 10 U/L (ref 0–35)
AST: 12 U/L (ref 0–37)
Albumin: 4.3 g/dL (ref 3.5–5.2)
Alkaline Phosphatase: 51 U/L (ref 39–117)
BUN: 17 mg/dL (ref 6–23)
CO2: 27 mEq/L (ref 19–32)
Calcium: 9.5 mg/dL (ref 8.4–10.5)
Chloride: 105 mEq/L (ref 96–112)
Creatinine, Ser: 0.81 mg/dL (ref 0.40–1.20)
GFR: 69.05 mL/min (ref >60.00)
Glucose, Bld: 88 mg/dL (ref 70–99)
Potassium: 4.3 mEq/L (ref 3.5–5.1)
Sodium: 140 mEq/L (ref 135–145)
Total Bilirubin: 0.5 mg/dL (ref 0.2–1.2)
Total Protein: 6.6 g/dL (ref 6.0–8.3)

## 2020-07-08 LAB — LIPID PANEL
Cholesterol: 158 mg/dL (ref 0–200)
HDL: 49.1 mg/dL (ref >39.00)
LDL Cholesterol: 80 mg/dL (ref 0–99)
NonHDL: 108.85
Total CHOL/HDL Ratio: 3
Triglycerides: 142 mg/dL (ref 0.0–149.0)
VLDL: 28.4 mg/dL (ref 0.0–40.0)

## 2020-07-08 LAB — HEMOGLOBIN A1C: Hgb A1c MFr Bld: 5.7 % (ref 4.6–6.5)

## 2020-07-08 LAB — TSH: TSH: 1.17 u[IU]/mL (ref 0.35–4.50)

## 2020-07-08 MED ORDER — LISINOPRIL-HYDROCHLOROTHIAZIDE 10-12.5 MG PO TABS: 1.0000 | ORAL_TABLET | Freq: Every day | ORAL | 3 refills | Status: DC

## 2020-07-08 NOTE — Addendum Note (Signed)
Addended by: Steve Rattler A on: 07/08/2020 11:17 AM   Modules accepted: Orders

## 2020-07-09 ENCOUNTER — Encounter: Payer: Self-pay | Admitting: Family Medicine

## 2020-07-09 LAB — HEPATITIS C ANTIBODY
Hepatitis C Ab: NONREACTIVE
SIGNAL TO CUT-OFF: 0.01 (ref <1.00)

## 2020-07-21 ENCOUNTER — Other Ambulatory Visit: Payer: Self-pay | Admitting: Family Medicine

## 2020-07-29 LAB — COLOGUARD: Cologuard: POSITIVE — AB

## 2020-08-07 ENCOUNTER — Other Ambulatory Visit: Payer: Self-pay | Admitting: Family Medicine

## 2020-08-07 ENCOUNTER — Encounter: Payer: Self-pay | Admitting: Gastroenterology

## 2020-08-07 ENCOUNTER — Encounter: Payer: Self-pay | Admitting: Family Medicine

## 2020-08-07 DIAGNOSIS — R195 Other fecal abnormalities: Secondary | ICD-10-CM

## 2020-08-07 DIAGNOSIS — Z1231 Encounter for screening mammogram for malignant neoplasm of breast: Secondary | ICD-10-CM

## 2020-08-08 ENCOUNTER — Encounter: Payer: Self-pay | Admitting: Family Medicine

## 2020-08-08 ENCOUNTER — Ambulatory Visit: Payer: Medicare HMO | Admitting: Family Medicine

## 2020-08-08 ENCOUNTER — Other Ambulatory Visit: Payer: Self-pay

## 2020-08-08 ENCOUNTER — Other Ambulatory Visit: Payer: Self-pay | Admitting: Family Medicine

## 2020-08-08 VITALS — BP 126/70 | HR 78 | Temp 98.1°F | Resp 17 | Ht 62.0 in | Wt 179.0 lb

## 2020-08-08 DIAGNOSIS — R300 Dysuria: Secondary | ICD-10-CM

## 2020-08-08 DIAGNOSIS — N390 Urinary tract infection, site not specified: Secondary | ICD-10-CM

## 2020-08-08 LAB — POCT URINALYSIS DIP (MANUAL ENTRY)
Bilirubin, UA: NEGATIVE
Glucose, UA: NEGATIVE mg/dL
Ketones, POC UA: NEGATIVE mg/dL
Nitrite, UA: NEGATIVE
Protein Ur, POC: NEGATIVE mg/dL
Spec Grav, UA: 1.02 (ref 1.010–1.025)
Urobilinogen, UA: 0.2 E.U./dL
pH, UA: 6 (ref 5.0–8.0)

## 2020-08-08 LAB — URINALYSIS, MICROSCOPIC ONLY: RBC / HPF: NONE SEEN (ref 0–?)

## 2020-08-08 MED ORDER — LEVOFLOXACIN 750 MG PO TABS: 750.0000 mg | ORAL_TABLET | Freq: Every day | ORAL | 0 refills | Status: DC

## 2020-08-08 MED ORDER — CIPROFLOXACIN HCL 500 MG PO TABS: 500.0000 mg | ORAL_TABLET | Freq: Two times a day (BID) | ORAL | 0 refills | Status: DC

## 2020-08-08 NOTE — Patient Instructions (Signed)
It was good to see you today - it does appear that you have a UTI We will treat you with cipro twice a day for 7 days Please let me know if you are not feeling better within 1-2 days-Sooner if worse.  I will be in touch with your urine culture asap

## 2020-08-08 NOTE — Telephone Encounter (Signed)
Changed rx to levaquin

## 2020-08-08 NOTE — Telephone Encounter (Signed)
Patient states [facility] at [facility] is out of the medication that Dr Lorelei Pont prescribed (cipro) please recommend an alternative medication please

## 2020-08-08 NOTE — Progress Notes (Addendum)
[facility] at [facility] [address], [place], [place] [address] [phone]  Date:  08/08/2020   Name:  Felicia Lowe   DOB:  [birth year]   MRN:  [id number]  PCP:  Mosie Lukes, MD    Chief Complaint: Recurrent UTI (Frequency, 100 fever, dizziness)   History of Present Illness:  Felicia Lowe is a 80 y.o. very pleasant female patient who presents with the following:  Pt of Dr Charlett Blake here today with concern of UTI- here today with her daughter  I did actually see her last month for a CPE History of prediabetes, hypothyroidism, hypertension, Fuchs corneal dystrophy  Sx of possible UTI started the last 2-3 days.   Yesterday she had urinary frequency and felt poorly -she ran a temp of 100 max She felt cold She started drinking lots of water but did not take any medications to reduce fever.  Today she feels much better She has been drinking cranberry juice  No gross hematuria No back pain or abd pain but she does feel a pressure in her lower belly No dysuria   No nausea or vomiting Eating ok   Patient Active Problem List   Diagnosis Date Noted  . Low back pain 04/04/2019  . Hyperlipidemia 05/30/2018  . Osteopenia 05/30/2018  . 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 Hyperglycemia 05/25/2017  . Atypical chest pain 04/04/2017  . Pedal edema 04/04/2017  . Preventative health care 11/25/2014  . Unsteady gait 11/25/2014  . Dizziness 09/10/2014  . GERD (gastroesophageal reflux disease) 05/27/2014  . Dermatitis 01/21/2014  . Allergic drug rash 03/17/2013  . Intertrigo 03/17/2013  . BPV (benign positional vertigo) 02/19/2013  . Urinary tract infection without hematuria 12/11/2012  . Medicare annual wellness visit, subsequent 02/08/2012  . Thyroid disease   . Essential hypertension   . Vitamin D deficiency   . Mumps   . Kidney stone   . Obesity   . Allergic state   . Arthritis   . H/O breast biopsy     Past Medical  History:  Diagnosis Date  . Allergy   . Arthritis    osteoarthritis, severe, b/l knees &b/l hips  replaced  . BPV (benign positional vertigo) 02/19/2013   mild  . Chicken pox as a child  . GERD (gastroesophageal reflux disease) 05/27/2014  . H/O breast biopsy    benign, on left  . Hypertension   . Kidney stone 2000   nephritis as a child  . Measles as a child  . Mumps as a child  . Obesity   . Osteoporosis   . Preventative health care 11/25/2014  . Pruritus 01/21/2017  . Scabies   . Skin lesion of face 05/18/2012  . Thyroid disease   . Unsteady gait 11/25/2014  . UTI (urinary tract infection) 12/11/2012  . Viral respiratory illness 05/21/2013  . Vitamin D deficiency     Past Surgical History:  Procedure Laterality Date  . CATARACT EXTRACTION Left 2017  . EYE SURGERY Left 2019   cataract removal left  . HIP SURGERY     both hips  . KNEE SURGERY     both knees replaced    Social History   Tobacco Use  . Smoking status: Never Smoker  . Smokeless tobacco: Never Used  Vaping Use  . Vaping Use: Never used  Substance Use Topics  . Alcohol use: Yes    Comment: Occasional glass of wine  . Drug use:  No    Family History  Problem Relation Age of Onset  . Hyperlipidemia Mother   . Hypertension Mother   . Heart attack Mother   . Cancer Father        lung- smoker  . Heart attack Father        X 2  . Thyroid disease Brother   . Hypertension Son   . Rashes / Skin problems Son   . Hernia Son        umbilical  . Cancer Paternal Grandmother   . Macular degeneration Brother     Allergies  Allergen Reactions  . Eggs Or Egg-Derived Products Shortness Of Breath and Rash  . Sulfa Antibiotics Nausea And Vomiting  . Keflex [Cephalexin] Rash    Rash   . Penicillins Rash  . Poison Ivy Extract [Poison Ivy Extract] Rash    Medication list has been reviewed and updated.  Current Outpatient Medications on File Prior to Visit  Medication Sig Dispense Refill  . Ascorbic  Acid (VITAMIN C) 1000 MG tablet Take 1,000 mg by mouth daily.    Marland Kitchen aspirin 81 MG tablet Take 81 mg by mouth daily.    . Cholecalciferol (VITAMIN D) 2000 UNITS tablet Take 2,000 Units by mouth daily.    . Coenzyme Q10 (CO Q 10) 100 MG CAPS Take 400 mg by mouth daily.    Marland Kitchen lisinopril-hydrochlorothiazide (ZESTORETIC) 10-12.5 MG tablet Take 1 tablet by mouth daily. 90 tablet 3  . Omega-3 Fatty Acids (FISH OIL) 1000 MG CPDR Take 1 capsule by mouth daily.    . Probiotic Product (PROBIOTIC DAILY PO) Take by mouth.    . SYNTHROID 125 MCG tablet TAKE 1 TABLET (125 MCG TOTAL) BY MOUTH DAILY. SYNTHROID 90 tablet 3   No current facility-administered medications on file prior to visit.    Review of Systems:  As per HPI- otherwise negative.   Physical Examination: Vitals:   08/08/20 1003  BP: 126/70  Pulse: 78  Resp: 17  Temp: 98.1 F (36.7 C)  SpO2: 98%   Vitals:   08/08/20 1003  Weight: 179 lb (81.2 kg)  Height: 5\' 2"  (1.575 m)   Body mass index is 32.74 kg/m. Ideal Body Weight: Weight in (lb) to have BMI = 25: 136.4  GEN: no acute distress. Looks well, overweight  HEENT: Atraumatic, Normocephalic.  Ears and Nose: No external deformity. CV: RRR, No M/G/R. No JVD. No thrill. No extra heart sounds. PULM: CTA B, no wheezes, crackles, rhonchi. No retractions. No resp. distress. No accessory muscle use. ABD: S, NT, ND, +BS. No rebound. No HSM. EXTR: No c/c/e PSYCH: Normally interactive. Conversant.   Belly is benign and no CVA tenderness  Results for orders placed or performed in visit on 08/08/20  POCT urinalysis dipstick  Result Value Ref Range   Color, UA yellow yellow   Clarity, UA cloudy (A) clear   Glucose, UA negative negative mg/dL   Bilirubin, UA negative negative   Ketones, POC UA negative negative mg/dL   Spec Grav, UA 1.020 1.010 - 1.025   Blood, UA trace-intact (A) negative   pH, UA 6.0 5.0 - 8.0   Protein Ur, POC negative negative mg/dL   Urobilinogen, UA 0.2  0.2 or 1.0 E.U./dL   Nitrite, UA Negative Negative   Leukocytes, UA Large (3+) (A) Negative      Assessment and Plan: Dysuria - Plan: Urine Culture, POCT urinalysis dipstick, Urine Microscopic Only, ciprofloxacin (CIPRO) 500 MG tablet  Pt here today  with sx of UTI/ mild elevation of temp yesterday Will start on cipro- discussed risks and benefits with pt and she prefers this agent Asked her to let me know if not feeling better in the next 1-2 days- Sooner if worse.  Will plan further follow- up pending labs.  This visit occurred during the SARS-CoV-2 public health emergency.  Safety protocols were in place, including screening questions prior to the visit, additional usage of staff PPE, and extensive cleaning of exam room while observing appropriate contact time as indicated for disinfecting solutions.    Signed Lamar Blinks, MD  Received her urine culture 2/5- message to pt  Results for orders placed or performed in visit on 08/08/20  Urine Culture   Specimen: Urine  Result Value Ref Range   MICRO NUMBER: [id number]    SPECIMEN QUALITY: Adequate    Sample Source NOT GIVEN    STATUS: FINAL    ISOLATE 1: Escherichia coli (A)       Susceptibility   Escherichia coli - URINE CULTURE, REFLEX    AMOX/CLAVULANIC <=2 Sensitive     AMPICILLIN <=2 Sensitive     AMPICILLIN/SULBACTAM <=2 Sensitive     CEFAZOLIN* <=4 Not Reportable      * For infections other than uncomplicated UTIcaused by E. coli, K. pneumoniae or P. mirabilis:Cefazolin is resistant if MIC > or = 8 mcg/mL.(Distinguishing susceptible versus intermediatefor isolates with MIC < or = 4 mcg/mL requiresadditional testing.)For uncomplicated UTI caused by E. coli,K. pneumoniae or P. mirabilis: Cefazolin issusceptible if MIC <32 mcg/mL and predictssusceptible to the oral agents cefaclor, cefdinir,cefpodoxime, cefprozil, cefuroxime, cephalexinand loracarbef.    CEFEPIME <=1 Sensitive     CEFTRIAXONE <=1 Sensitive      CIPROFLOXACIN <=0.25 Sensitive     LEVOFLOXACIN <=0.12 Sensitive     ERTAPENEM <=0.5 Sensitive     GENTAMICIN <=1 Sensitive     IMIPENEM <=0.25 Sensitive     NITROFURANTOIN 32 Sensitive     PIP/TAZO <=4 Sensitive     TOBRAMYCIN <=1 Sensitive     TRIMETH/SULFA* <=20 Sensitive      * For infections other than uncomplicated UTIcaused by E. coli, K. pneumoniae or P. mirabilis:Cefazolin is resistant if MIC > or = 8 mcg/mL.(Distinguishing susceptible versus intermediatefor isolates with MIC < or = 4 mcg/mL requiresadditional testing.)For uncomplicated UTI caused by E. coli,K. pneumoniae or P. mirabilis: Cefazolin issusceptible if MIC <32 mcg/mL and predictssusceptible to the oral agents cefaclor, cefdinir,cefpodoxime, cefprozil, cefuroxime, cephalexinand loracarbef.Legend:S = Susceptible  I = IntermediateR = Resistant  NS = Not susceptible* = Not tested  NR = Not reported**NN = See antimicrobic comments  Urine Microscopic Only  Result Value Ref Range   WBC, UA 21-50/hpf (A) 0-2/hpf   RBC / HPF none seen 0-2/hpf   Squamous Epithelial / LPF Few(5-10/hpf) (A) Rare(0-4/hpf)   Bacteria, UA Few(10-50/hpf) (A) None  POCT urinalysis dipstick  Result Value Ref Range   Color, UA yellow yellow   Clarity, UA cloudy (A) clear   Glucose, UA negative negative mg/dL   Bilirubin, UA negative negative   Ketones, POC UA negative negative mg/dL   Spec Grav, UA 1.020 1.010 - 1.025   Blood, UA trace-intact (A) negative   pH, UA 6.0 5.0 - 8.0   Protein Ur, POC negative negative mg/dL   Urobilinogen, UA 0.2 0.2 or 1.0 E.U./dL   Nitrite, UA Negative Negative   Leukocytes, UA Large (3+) (A) Negative

## 2020-08-10 ENCOUNTER — Encounter: Payer: Self-pay | Admitting: Family Medicine

## 2020-08-10 LAB — URINE CULTURE
MICRO NUMBER:: 11491720
SPECIMEN QUALITY:: ADEQUATE

## 2020-08-12 ENCOUNTER — Ambulatory Visit (HOSPITAL_BASED_OUTPATIENT_CLINIC_OR_DEPARTMENT_OTHER): Payer: Medicare HMO

## 2020-08-13 ENCOUNTER — Ambulatory Visit (HOSPITAL_BASED_OUTPATIENT_CLINIC_OR_DEPARTMENT_OTHER)
Admission: RE | Admit: 2020-08-13 | Discharge: 2020-08-13 | Disposition: A | Discharge status: Home / Self Care | Payer: Medicare HMO | Source: Ambulatory Visit | Attending: Family Medicine | Admitting: Family Medicine

## 2020-08-13 ENCOUNTER — Other Ambulatory Visit: Payer: Self-pay

## 2020-08-13 ENCOUNTER — Encounter (HOSPITAL_BASED_OUTPATIENT_CLINIC_OR_DEPARTMENT_OTHER): Payer: Self-pay

## 2020-08-13 DIAGNOSIS — Z1231 Encounter for screening mammogram for malignant neoplasm of breast: Secondary | ICD-10-CM | POA: Diagnosis present

## 2020-08-16 ENCOUNTER — Other Ambulatory Visit: Payer: Self-pay | Admitting: Family Medicine

## 2020-08-16 DIAGNOSIS — R928 Other abnormal and inconclusive findings on diagnostic imaging of breast: Secondary | ICD-10-CM

## 2020-08-18 ENCOUNTER — Encounter: Payer: Self-pay | Admitting: Family Medicine

## 2020-08-23 ENCOUNTER — Encounter: Payer: Self-pay | Admitting: Gastroenterology

## 2020-08-23 ENCOUNTER — Ambulatory Visit: Payer: Medicare HMO | Admitting: Gastroenterology

## 2020-08-23 VITALS — BP 120/74 | HR 93 | Ht 62.0 in | Wt 179.2 lb

## 2020-08-23 DIAGNOSIS — R195 Other fecal abnormalities: Secondary | ICD-10-CM | POA: Diagnosis not present

## 2020-08-23 MED ORDER — NA SULFATE-K SULFATE-MG SULF 17.5-3.13-1.6 GM/177ML PO SOLN: 1.0000 | Freq: Once | ORAL | 0 refills | Status: AC

## 2020-08-23 NOTE — Progress Notes (Signed)
Chief Complaint: Positive Cologuard  Referring Provider:     Darreld Mclean, MD  HPI:     Felicia Lowe is a 80 y.o. female referred to the [facility]ology [facility] for evaluation of positive Cologuard.  She is otherwise without overt GI bleed.  No abdominal pain or other GI symptoms.  Tolerating p.o. intake without issue.  Normal CBC, CMP in 07/2020.  Otherwise feels in her normal state of health.  No known family history of colon cancer.  Previous colon cancer screening as below.  -Colonoscopy in 2006, normal per patient. -Cologuard in 2018 was negative -Cologuard 07/29/2020: Positive   Past Medical History:  Diagnosis Date  . Allergy   . Arthritis    osteoarthritis, severe, b/l knees &b/l hips  replaced  . BPV (benign positional vertigo) 02/19/2013   mild  . Chicken pox as a child  . GERD (gastroesophageal reflux disease) 05/27/2014  . H/O breast biopsy    benign, on left  . Hypertension   . Kidney stone 2000   nephritis as a child  . Measles as a child  . Mumps as a child  . Obesity   . Osteoporosis   . Preventative health care 11/25/2014  . Pruritus 01/21/2017  . Scabies   . Skin lesion of face 05/18/2012  . Thyroid disease   . Unsteady gait 11/25/2014  . UTI (urinary tract infection) 12/11/2012  . UTI (urinary tract infection)   . Viral respiratory illness 05/21/2013  . Vitamin D deficiency      Past Surgical History:  Procedure Laterality Date  . CATARACT EXTRACTION Left 2017  . EYE SURGERY Left 2019   cataract removal left  . HIP SURGERY     both hips  . KNEE SURGERY     both knees replaced  . LITHOTRIPSY     For kidney stones   Family History  Problem Relation Age of Onset  . Hyperlipidemia Mother   . Hypertension Mother   . Heart attack Mother   . Other Mother        goiter problems  . Cancer Father        lung- smoker  . Heart attack Father        X 2  . Thyroid disease Brother   . Hypertension Son   . Rashes / Skin problems  Son   . Hernia Son        umbilical  . Cancer Paternal Grandmother   . Macular degeneration Brother   . Breast cancer Maternal Aunt   . Colon cancer Neg Hx   . Esophageal cancer Neg Hx    Social History   Tobacco Use  . Smoking status: Never Smoker  . Smokeless tobacco: Never Used  Vaping Use  . Vaping Use: Never used  Substance Use Topics  . Alcohol use: Yes    Comment: Occasional glass of wine  . Drug use: No   Current Outpatient Medications  Medication Sig Dispense Refill  . Ascorbic Acid (VITAMIN C) 1000 MG tablet Take 1,000 mg by mouth daily.    Marland Kitchen aspirin 81 MG tablet Take 81 mg by mouth daily.    . Cholecalciferol (VITAMIN D) 2000 UNITS tablet Take 2,000 Units by mouth daily.    . Coenzyme Q10 (CO Q 10) 100 MG CAPS Take 400 mg by mouth daily.    Marland Kitchen levofloxacin (LEVAQUIN) 750 MG tablet Take 1 tablet (750 mg total) by  mouth daily. 5 tablet 0  . lisinopril-hydrochlorothiazide (ZESTORETIC) 10-12.5 MG tablet Take 1 tablet by mouth daily. 90 tablet 3  . Omega-3 Fatty Acids (FISH OIL) 1000 MG CPDR Take 1 capsule by mouth daily.    . Probiotic Product (PROBIOTIC DAILY PO) Take by mouth.    . SYNTHROID 125 MCG tablet TAKE 1 TABLET (125 MCG TOTAL) BY MOUTH DAILY. SYNTHROID 90 tablet 3   No current facility-administered medications for this visit.   Allergies  Allergen Reactions  . Eggs Or Egg-Derived Products Shortness Of Breath and Rash  . Sulfa Antibiotics Nausea And Vomiting  . Keflex [Cephalexin] Rash    Rash   . Penicillins Rash  . Poison Ivy Extract [Poison Ivy Extract] Rash     Review of Systems: All systems reviewed and negative except where noted in HPI.     Physical Exam:    Wt Readings from Last 3 Encounters:  08/23/20 179 lb 4 oz (81.3 kg)  08/08/20 179 lb (81.2 kg)  07/08/20 177 lb (80.3 kg)    BP 120/74   Pulse 93   Ht 5\' 2"  (1.575 m)   Wt 179 lb 4 oz (81.3 kg)   BMI 32.79 kg/m  Constitutional:  Pleasant, in no acute distress. Psychiatric:  Normal mood and affect. Behavior is normal. EENT: Pupils normal.  Conjunctivae are normal. No scleral icterus. Neck supple. No cervical LAD. Cardiovascular: Normal rate, regular rhythm. No edema Pulmonary/chest: Effort normal and breath sounds normal. No wheezing, rales or rhonchi. Abdominal: Soft, nondistended, nontender. Bowel sounds active throughout. There are no masses palpable. No hepatomegaly. Neurological: Alert and oriented to person place and time. Skin: Skin is warm and dry. No rashes noted.   ASSESSMENT AND PLAN;   1) Positive Cologuard 80 year old female with recent positive Cologuard test without overt GI blood loss.  I discussed Cologuard testing with regards to the colon cancer, colon polyps, and alternate etiologies for a positive Cologuard test.  After discussion today, she would like to proceed with optical colonoscopy.  -Schedule colonoscopy  The indications, risks, and benefits of colonoscopy were explained to the patient in detail. Risks include but are not limited to bleeding, perforation, adverse reaction to medications, and cardiopulmonary compromise. Sequelae include but are not limited to the possibility of surgery, hospitalization, and mortality. The patient verbalized understanding and wished to proceed. All questions answered, referred to the scheduler and bowel prep ordered. Further recommendations pending results of the exam.     Lavena Bullion, DO, FACG  08/23/2020, 1:33 PM   Copland, Gay Filler, MD

## 2020-08-23 NOTE — Patient Instructions (Addendum)
If you are age 80 or older, your body mass index should be between 23-30. Your Body mass index is 32.79 kg/m. If this is out of the aforementioned range listed, please consider follow up with your Primary Care Provider.  If you are age 50 or younger, your body mass index should be between 19-25. Your Body mass index is 32.79 kg/m. If this is out of the aformentioned range listed, please consider follow up with your Primary Care Provider.   We have sent the following medications to your pharmacy for you to pick up at your convenience:  suprep    Due to recent changes in healthcare laws, you may see the results of your imaging and laboratory studies on MyChart before your provider has had a chance to review them.  We understand that in some cases there may be results that are confusing or concerning to you. Not all laboratory results come back in the same time frame and the provider may be waiting for multiple results in order to interpret others.  Please give [place] 48 hours in order for your provider to thoroughly review all the results before contacting the office for clarification of your results.   Thank you for choosing me and [facility].  Vito Cirigliano, D.O.

## 2020-08-29 ENCOUNTER — Ambulatory Visit
Admission: RE | Admit: 2020-08-29 | Discharge: 2020-08-29 | Disposition: A | Discharge status: Home / Self Care | Payer: Medicare HMO | Source: Ambulatory Visit | Attending: Family Medicine | Admitting: Family Medicine

## 2020-08-29 ENCOUNTER — Other Ambulatory Visit: Payer: Self-pay | Admitting: Family Medicine

## 2020-08-29 ENCOUNTER — Other Ambulatory Visit: Payer: Self-pay

## 2020-08-29 DIAGNOSIS — R928 Other abnormal and inconclusive findings on diagnostic imaging of breast: Secondary | ICD-10-CM

## 2020-10-16 ENCOUNTER — Encounter: Payer: Self-pay | Admitting: Gastroenterology

## 2020-10-23 ENCOUNTER — Ambulatory Visit (AMBULATORY_SURGERY_CENTER): Payer: Medicare HMO | Admitting: Gastroenterology

## 2020-10-23 ENCOUNTER — Other Ambulatory Visit: Payer: Self-pay

## 2020-10-23 ENCOUNTER — Encounter: Payer: Self-pay | Admitting: Gastroenterology

## 2020-10-23 VITALS — BP 124/62 | HR 72 | Temp 97.5°F | Resp 14 | Ht 62.0 in | Wt 179.0 lb

## 2020-10-23 DIAGNOSIS — R195 Other fecal abnormalities: Secondary | ICD-10-CM | POA: Diagnosis present

## 2020-10-23 DIAGNOSIS — K640 First degree hemorrhoids: Secondary | ICD-10-CM | POA: Diagnosis not present

## 2020-10-23 DIAGNOSIS — D122 Benign neoplasm of ascending colon: Secondary | ICD-10-CM | POA: Diagnosis not present

## 2020-10-23 DIAGNOSIS — K5730 Diverticulosis of large intestine without perforation or abscess without bleeding: Secondary | ICD-10-CM | POA: Diagnosis not present

## 2020-10-23 DIAGNOSIS — D120 Benign neoplasm of cecum: Secondary | ICD-10-CM

## 2020-10-23 MED ORDER — SODIUM CHLORIDE 0.9 % IV SOLN: 500.0000 mL | Freq: Once | INTRAVENOUS | Status: DC

## 2020-10-23 NOTE — Patient Instructions (Signed)
Thank you for allowing [place] to care for you today!  Await pathology results of polyps removed, approximately 2 weeks.  Resume previous diet and medications today.  Resume your regular daily activities tomorrow.    YOU HAD AN ENDOSCOPIC PROCEDURE TODAY AT [facility]:   Refer to the procedure report that was given to you for any specific questions about what was found during the examination.  If the procedure report does not answer your questions, please call your gastroenterologist to clarify.  If you requested that your care partner not be given the details of your procedure findings, then the procedure report has been included in a sealed envelope for you to review at your convenience later.  YOU SHOULD EXPECT: Some feelings of bloating in the abdomen. Passage of more gas than usual.  Walking can help get rid of the air that was put into your GI tract during the procedure and reduce the bloating. If you had a lower endoscopy (such as a colonoscopy or flexible sigmoidoscopy) you may notice spotting of blood in your stool or on the toilet paper. If you underwent a bowel prep for your procedure, you may not have a normal bowel movement for a few days.  Please Note:  You might notice some irritation and congestion in your nose or some drainage.  This is from the oxygen used during your procedure.  There is no need for concern and it should clear up in a day or so.  SYMPTOMS TO REPORT IMMEDIATELY:   Following lower endoscopy (colonoscopy or flexible sigmoidoscopy):  Excessive amounts of blood in the stool  Significant tenderness or worsening of abdominal pains  Swelling of the abdomen that is new, acute  Fever of 100F or higher   For urgent or emergent issues, a gastroenterologist can be reached at any hour by calling [phone]. Do not use MyChart messaging for urgent concerns.    DIET:  We do recommend a small meal at first, but then you may proceed to your regular  diet.  Drink plenty of fluids but you should avoid alcoholic beverages for 24 hours.  ACTIVITY:  You should plan to take it easy for the rest of today and you should NOT DRIVE or use heavy machinery until tomorrow (because of the sedation medicines used during the test).    FOLLOW UP: Our staff will call the number listed on your records 48-72 hours following your procedure to check on you and address any questions or concerns that you may have regarding the information given to you following your procedure. If we do not reach you, we will leave a message.  We will attempt to reach you two times.  During this call, we will ask if you have developed any symptoms of COVID 19. If you develop any symptoms (ie: fever, flu-like symptoms, shortness of breath, cough etc.) before then, please call [phone].  If you test positive for Covid 19 in the 2 weeks post procedure, please call and report this information to [place].    If any biopsies were taken you will be contacted by phone or by letter within the next 1-3 weeks.  Please call us at [phone] if you have not heard about the biopsies in 3 weeks.    SIGNATURES/CONFIDENTIALITY: You and/or your care partner have signed paperwork which will be entered into your electronic medical record.  These signatures attest to the fact that that the information above on your After Visit Summary has been reviewed  and is understood.  Full responsibility of the confidentiality of this discharge information lies with you and/or your care-partner. 

## 2020-10-23 NOTE — Op Note (Signed)
[facility] Patient Name: Felicia Lowe Procedure Date: 10/23/2020 9:37 AM MRN: [id number] Endoscopist: Gerrit Heck , MD Age: 80 Referring MD:  Date of Birth: 1940/08/16 Gender: Female Account #: [id number] Procedure:                Colonoscopy Indications:              Positive Cologuard test Medicines:                Monitored Anesthesia Care Procedure:                Pre-Anesthesia Assessment:                           - Prior to the procedure, a History and Physical                            was performed, and patient medications and                            allergies were reviewed. The patient's tolerance of                            previous anesthesia was also reviewed. The risks                            and benefits of the procedure and the sedation                            options and risks were discussed with the patient.                            All questions were answered, and informed consent                            was obtained. Prior Anticoagulants: The patient has                            taken no previous anticoagulant or antiplatelet                            agents. ASA Grade Assessment: II - A patient with                            mild systemic disease. After reviewing the risks                            and benefits, the patient was deemed in                            satisfactory condition to undergo the procedure.                           After obtaining informed consent, the colonoscope  was passed under direct vision. Throughout the                            procedure, the patient's blood pressure, pulse, and                            oxygen saturations were monitored continuously. The                            Olympus CF-HQ190 [id number]) Colonoscope was                            introduced through the anus and advanced to the the                            cecum, identified by appendiceal  orifice and                            ileocecal valve. The colonoscopy was performed                            without difficulty. The patient tolerated the                            procedure well. The quality of the bowel                            preparation was good. The ileocecal valve,                            appendiceal orifice, and rectum were photographed. Scope In: 9:53:23 AM Scope Out: 10:20:25 AM Scope Withdrawal Time: 0 hours 18 minutes 29 seconds  Total Procedure Duration: 0 hours 27 minutes 2 seconds  Findings:                 The perianal and digital rectal examinations were                            normal.                           Three sessile polyps were found in the ascending                            colon (2) and cecum (1). The polyps were 4 to 8 mm                            in size. These polyps were removed with a cold                            snare. Resection and retrieval were complete.                            Estimated blood loss was minimal.  Many small and large-mouthed diverticula were found                            in the entire colon.                           Non-bleeding internal hemorrhoids were found during                            retroflexion. The hemorrhoids were small. Complications:            No immediate complications. Estimated Blood Loss:     Estimated blood loss was minimal. Impression:               - Three 4 to 8 mm polyps in the ascending colon and                            in the cecum, removed with a cold snare. Resected                            and retrieved.                           - Diverticulosis in the entire examined colon.                           - Non-bleeding internal hemorrhoids. Recommendation:           - Patient has a contact number available for                            emergencies. The signs and symptoms of potential                            delayed complications  were discussed with the                            patient. Return to normal activities tomorrow.                            Written discharge instructions were provided to the                            patient.                           - Resume previous diet.                           - Continue present medications.                           - Await pathology results.                           - Repeat colonoscopy for surveillance based on  pathology results.                           - Return to GI clinic PRN.                           - Use fiber, for example Citrucel, Fibercon, Konsyl                            or Metamucil. Gerrit Heck, MD 10/23/2020 10:25:52 AM

## 2020-10-23 NOTE — Progress Notes (Signed)
Called to room to assist during endoscopic procedure.  Patient ID and intended procedure confirmed with present staff. Received instructions for my participation in the procedure from the performing physician.  

## 2020-10-23 NOTE — Progress Notes (Signed)
VS-CW 

## 2020-10-23 NOTE — Progress Notes (Signed)
A/ox3, pleased with MAC, report to RN 

## 2020-10-28 ENCOUNTER — Ambulatory Visit: Payer: Medicare HMO | Admitting: *Deleted

## 2020-10-31 ENCOUNTER — Ambulatory Visit: Payer: Medicare HMO

## 2020-11-07 ENCOUNTER — Encounter: Payer: Self-pay | Admitting: Family Medicine

## 2020-11-07 ENCOUNTER — Encounter: Payer: Self-pay | Admitting: Gastroenterology

## 2021-02-12 ENCOUNTER — Ambulatory Visit (INDEPENDENT_AMBULATORY_CARE_PROVIDER_SITE_OTHER): Payer: Medicare HMO

## 2021-02-12 ENCOUNTER — Other Ambulatory Visit: Payer: Self-pay

## 2021-02-12 VITALS — BP 118/82 | HR 96 | Temp 97.8°F | Resp 16 | Ht 62.0 in | Wt 178.2 lb

## 2021-02-12 DIAGNOSIS — Z0000 Encounter for general adult medical examination without abnormal findings: Secondary | ICD-10-CM

## 2021-02-12 NOTE — Progress Notes (Addendum)
Subjective:   Felicia Lowe is a 80 y.o. female who presents for Medicare Annual (Subsequent) preventive examination.  Review of Systems     Cardiac Risk Factors include: advanced age (>44mn, >>44women);hypertension;dyslipidemia;obesity (BMI >30kg/m2)     Objective:    Today's Vitals   02/12/21 1335  BP: 118/82  Pulse: 96  Resp: 16  Temp: 97.8 F (36.6 C)  TempSrc: Temporal  SpO2: 95%  Weight: 178 lb 3.2 oz (80.8 kg)  Height: '5\' 2"'$  (1.575 m)   Body mass index is 32.59 kg/m.  Advanced Directives 02/12/2021 10/26/2019 06/29/2019 10/24/2018 10/20/2017 10/19/2016 11/15/2014  Does Patient Have a Medical Advance Directive? Yes No No No No Yes No  Type of [place] - - - - - -  Does patient want to make changes to medical advance directive? - - - - Yes (MAU/Ambulatory/Procedural Areas - Information given) Yes (MAU/Ambulatory/Procedural Areas - Information given) -  Copy of [place] Chart? No - copy requested - - - - - -  Would patient like information on creating a medical advance directive? - No - Patient declined Yes (ED - Information included in AVS) No - Patient declined - - Yes - Educational materials given    Current Medications (verified) Outpatient Encounter Medications as of 02/12/2021  Medication Sig   Ascorbic Acid (VITAMIN C) 1000 MG tablet Take 1,000 mg by mouth daily.   aspirin 81 MG tablet Take 81 mg by mouth daily.   Cholecalciferol (VITAMIN D) 2000 UNITS tablet Take 2,000 Units by mouth daily.   Coenzyme Q10 (CO Q 10) 100 MG CAPS Take 400 mg by mouth daily.   lisinopril-hydrochlorothiazide (ZESTORETIC) 10-12.5 MG tablet Take 1 tablet by mouth daily.   Omega-3 Fatty Acids (FISH OIL) 1000 MG CPDR Take 1 capsule by mouth daily.   Probiotic Product (PROBIOTIC DAILY PO) Take by mouth.   SYNTHROID 125 MCG tablet TAKE 1 TABLET (125 MCG TOTAL) BY MOUTH DAILY. SYNTHROID   No facility-administered  encounter medications on file as of 02/12/2021.    Allergies (verified) Eggs or egg-derived products, Sulfa antibiotics, Keflex [cephalexin], Penicillins, and Poison ivy extract [poison ivy extract]   History: Past Medical History:  Diagnosis Date   Allergy    Arthritis    osteoarthritis, severe, b/l knees &b/l hips  replaced   BPV (benign positional vertigo) 02/19/2013   mild   Chicken pox as a child   GERD (gastroesophageal reflux disease) 05/27/2014   H/O breast biopsy    benign, on left   Hypertension    Kidney stone 2000   nephritis as a child   Measles as a child   Mumps as a child   Obesity    Osteoporosis    Preventative health care 11/25/2014   Pruritus 01/21/2017   Scabies    Skin lesion of face 05/18/2012   Thyroid disease    Unsteady gait 11/25/2014   UTI (urinary tract infection) 12/11/2012   UTI (urinary tract infection)    Viral respiratory illness 05/21/2013   Vitamin D deficiency    Past Surgical History:  Procedure Laterality Date   CATARACT EXTRACTION Left 2017   EYE SURGERY Left 2019   cataract removal left   HIP SURGERY     both hips   KNEE SURGERY     both knees replaced   LITHOTRIPSY     For kidney stones   Family History  Problem Relation Age of Onset  Hyperlipidemia Mother    Hypertension Mother    Heart attack Mother    Other Mother        goiter problems   Cancer Father        lung- smoker   Heart attack Father        X 2   Thyroid disease Brother    Hypertension Son    Rashes / Skin problems Son    Hernia Son        umbilical   Cancer Paternal Grandmother    Macular degeneration Brother    Breast cancer Maternal Aunt    Colon cancer Neg Hx    Esophageal cancer Neg Hx    Rectal cancer Neg Hx    Stomach cancer Neg Hx    Social History   Socioeconomic History   Marital status: Married    Spouse name: Not on file   Number of children: Not on file   Years of education: Not on file   Highest education level: Not on file   Occupational History   Occupation: Retired Art therapist  Tobacco Use   Smoking status: Never   Smokeless tobacco: Never  Vaping Use   Vaping Use: Never used  Substance and Sexual Activity   Alcohol use: Not Currently    Comment: Rarely glass of wine   Drug use: No   Sexual activity: Never  Other Topics Concern   Not on file  Social History Narrative   Not on file   Social Determinants of Health   Financial Resource Strain: Low Risk    Difficulty of Paying Living Expenses: Not hard at all  Food Insecurity: No Food Insecurity   Worried About Charity fundraiser in the Last Year: Never true   [place] in the Last Year: Never true  Transportation Needs: No Transportation Needs   Lack of Transportation (Medical): No   Lack of Transportation (Non-Medical): No  Physical Activity: Insufficiently Active   Days of Exercise per Week: 7 days   Minutes of Exercise per Session: 20 min  Stress: No Stress Concern Present   Feeling of Stress : Not at all  Social Connections: Moderately Isolated   Frequency of Communication with Friends and Family: More than three times a week   Frequency of Social Gatherings with Friends and Family: Once a week   Attends Religious Services: Never   Marine scientist or Organizations: No   Attends Music therapist: Never   Marital Status: Married    Tobacco Counseling Counseling given: Not Answered   Clinical Intake:  Pre-visit preparation completed: Yes  Pain : No/denies pain     Nutritional Status: BMI > 30  Obese Nutritional Risks: None Diabetes: No  How often do you need to have someone help you when you read instructions, pamphlets, or other written materials from your doctor or pharmacy?: 1 - Never  Diabetic?No  Interpreter Needed?: No  Information entered by :: Caroleen Hamman LPN   Activities of Daily Living In your present state of health, do you have any difficulty performing the following  activities: 02/12/2021  Hearing? N  Vision? N  Difficulty concentrating or making decisions? N  Walking or climbing stairs? N  Dressing or bathing? N  Doing errands, shopping? N  Preparing Food and eating ? N  Using the Toilet? N  In the past six months, have you accidently leaked urine? Y  Do you have problems with loss of bowel control? N  Managing your Medications? N  Managing your Finances? N  Housekeeping or managing your Housekeeping? N  Some recent data might be hidden    Patient Care Team: Mosie Lukes, MD as PCP - General (Family Medicine) Stephannie Li, [place] as Consulting Physician (Ophthalmology) Valinda Party, MD as Consulting Physician (Rheumatology) Harold Hedge, Darrick Grinder, MD as Consulting Physician (Allergy and Immunology)  Indicate any recent Medical Services you may have received from other than [facility] providers in the past year (date may be approximate).     Assessment:   This is a routine wellness examination for Tajanae.  Hearing/Vision screen Hearing Screening - Comments:: No issues Vision Screening - Comments:: Last eye exam-2-3 years ago-[facility]  Dietary issues and exercise activities discussed: Current Exercise Habits: Home exercise routine, Type of exercise: walking (tries to get 5000 steps per day), Frequency (Times/Week): 7, Intensity: Mild, Exercise limited by: None identified   Goals Addressed             This Visit's Progress    Increase physical activity   On track      Depression Screen PHQ 2/9 Scores 02/12/2021 10/26/2019 10/24/2018 10/20/2017 10/19/2016 03/06/2016 11/15/2014  PHQ - 2 Score 1 0 0 0 0 0 0    Fall Risk Fall Risk  02/12/2021 10/26/2019 10/24/2018 10/20/2017 10/19/2016  Falls in the past year? 0 0 0 Yes No  Number falls in past yr: 0 0 - 2 or more -  Injury with Fall? 0 0 - No -  Follow up Falls prevention discussed Education provided;Falls prevention discussed - Education provided;Falls prevention discussed -    FALL  RISK PREVENTION PERTAINING TO THE HOME:  Any stairs in or around the home? Yes  If so, are there any without handrails? No  Home free of loose throw rugs in walkways, pet beds, electrical cords, etc? Yes  Adequate lighting in your home to reduce risk of falls? Yes   ASSISTIVE DEVICES UTILIZED TO PREVENT FALLS:  Life alert? No  Use of a cane, walker or w/c? No  Grab bars in the bathroom? Yes  Shower chair or bench in shower? No  Elevated toilet seat or a handicapped toilet? No   TIMED UP AND GO:  Was the test performed? Yes .  Length of time to ambulate 10 feet: 10 sec.   Gait steady and fast without use of assistive device  Cognitive Function:Normal cognitive status assessed by direct observation by this Nurse Health Advisor. No abnormalities found.   MMSE - Mini Mental State Exam 10/19/2016  Orientation to time 5  Orientation to Place 5  Registration 3  Attention/ Calculation 5  Recall 3  Language- name 2 objects 2  Language- repeat 1  Language- follow 3 step command 3  Language- read & follow direction 1  Write a sentence 1  Copy design 0  Total score 29        Immunizations Immunization History  Administered Date(s) Administered   Influenza Inj Mdck Quad Pf 04/07/2019   Influenza, Quadrivalent, Recombinant, Inj, Pf 05/15/2016, 05/25/2017, 05/30/2018   Influenza,trivalent, recombinat, inj, PF 05/17/2014, 05/07/2015   Influenza-Unspecified 05/02/2020   PFIZER Comirnaty(Gray Top)Covid-19 Tri-Sucrose Vaccine 11/12/2020   PFIZER(Purple Top)SARS-COV-2 Vaccination 07/15/2019, 08/05/2019, 04/03/2020   Pneumococcal Conjugate-13 03/06/2016   Pneumococcal Polysaccharide-23 07/06/2010   Td 03/06/2016   Tdap 07/06/2001   Zoster Recombinat (Shingrix) 08/23/2018   Zoster, Live 04/06/2011    TDAP status: Up to date  Flu Vaccine status: Up to date  Pneumococcal  vaccine status: Up to date  Covid-19 vaccine status: Completed vaccines  Qualifies for Shingles Vaccine?  No   Zostavax completed Yes   Shingrix Completed?: Yes  Screening Tests Health Maintenance  Topic Date Due   Zoster Vaccines- Shingrix (2 of 2) 10/18/2018   INFLUENZA VACCINE  02/03/2021   COVID-19 Vaccine (5 - Booster for Pfizer series) 03/15/2021   TETANUS/TDAP  03/06/2026   DEXA SCAN  Completed   Hepatitis C Screening  Completed   PNA vac Low Risk Adult  Completed   HPV VACCINES  Aged Out    Health Maintenance  Health Maintenance Due  Topic Date Due   Zoster Vaccines- Shingrix (2 of 2) 10/18/2018   INFLUENZA VACCINE  02/03/2021    Colorectal cancer screening: No longer required.   Mammogram status: Completed Bilateral 08/13/2020 . Repeat every year  Bone Density status: Declined  Lung Cancer Screening: (Low Dose CT Chest recommended if Age 59-80 years, 30 pack-year currently smoking OR have quit w/in 15years.) does not qualify.     Additional Screening:  Hepatitis C Screening:  Completed 07/08/2020  Vision Screening: Recommended annual ophthalmology exams for early detection of glaucoma and other disorders of the eye. Is the patient up to date with their annual eye exam?  No  Patient plans to make an appt soon Who is the provider or what is the name of the office in which the patient attends annual eye exams? [facility]   Dental Screening: Recommended annual dental exams for proper oral hygiene  Community Resource Referral / Chronic Care Management: CRR required this visit?  No   CCM required this visit?  No      Plan:     I have personally reviewed and noted the following in the patient's chart:   Medical and social history Use of alcohol, tobacco or illicit drugs  Current medications and supplements including opioid prescriptions.  Functional ability and status Nutritional status Physical activity Advanced directives List of other physicians Hospitalizations, surgeries, and ER visits in previous 12 months Vitals Screenings to include cognitive,  depression, and falls Referrals and appointments  In addition, I have reviewed and discussed with patient certain preventive protocols, quality metrics, and best practice recommendations. A written personalized care plan for preventive services as well as general preventive health recommendations were provided to patient.   Patient to access avs on mychart.  Marta Antu, LPN   [id number]  Nurse Health Advisor  Nurse Notes: None   I have reviewed and agree with [facility] Co[facility] documentation.  Kathlene November, MD

## 2021-02-12 NOTE — Patient Instructions (Signed)
Felicia Lowe , Thank you for taking time to come for your Medicare Wellness Visit. I appreciate your ongoing commitment to your health goals. Please review the following plan we discussed and let me know if I can assist you in the future.   Screening recommendations/referrals: Colonoscopy: No longer required Mammogram: Completed 08/13/2020-Due 08/13/2021 Bone Density: Declined Recommended yearly ophthalmology/optometry visit for glaucoma screening and checkup Recommended yearly dental visit for hygiene and checkup  Vaccinations: Influenza vaccine: Up to date Pneumococcal vaccine: Up to date Tdap vaccine: Up to date-Due-03/06/2026 Shingles vaccine: 2nd dose due-Discuss with pharmacy   Covid-19:Up to date  Advanced directives: Please bring a copy for your chart  Conditions/risks identified: See problem list  Next appointment: Follow up in one year for your annual wellness visit 03/16/2022 @ 9:40   Preventive Care 80 Years and Older, Female Preventive care refers to lifestyle choices and visits with your health care provider that can promote health and wellness. What does preventive care include? A yearly physical exam. This is also called an annual well check. Dental exams once or twice a year. Routine eye exams. Ask your health care provider how often you should have your eyes checked. Personal lifestyle choices, including: Daily care of your teeth and gums. Regular physical activity. Eating a healthy diet. Avoiding tobacco and drug use. Limiting alcohol use. Practicing safe sex. Taking low-dose aspirin every day. Taking vitamin and mineral supplements as recommended by your health care provider. What happens during an annual well check? The services and screenings done by your health care provider during your annual well check will depend on your age, overall health, lifestyle risk factors, and family history of disease. Counseling  Your health care provider may ask you questions  about your: Alcohol use. Tobacco use. Drug use. Emotional well-being. Home and relationship well-being. Sexual activity. Eating habits. History of falls. Memory and ability to understand (cognition). Work and work Statistician. Reproductive health. Screening  You may have the following tests or measurements: Height, weight, and BMI. Blood pressure. Lipid and cholesterol levels. These may be checked every 5 years, or more frequently if you are over 69 years old. Skin check. Lung cancer screening. You may have this screening every year starting at age 30 if you have a 30-pack-year history of smoking and currently smoke or have quit within the past 15 years. Fecal occult blood test (FOBT) of the stool. You may have this test every year starting at age 56. Flexible sigmoidoscopy or colonoscopy. You may have a sigmoidoscopy every 5 years or a colonoscopy every 10 years starting at age 12. Hepatitis C blood test. Hepatitis B blood test. Sexually transmitted disease (STD) testing. Diabetes screening. This is done by checking your blood sugar (glucose) after you have not eaten for a while (fasting). You may have this done every 1-3 years. Bone density scan. This is done to screen for osteoporosis. You may have this done starting at age 11. Mammogram. This may be done every 1-2 years. Talk to your health care provider about how often you should have regular mammograms. Talk with your health care provider about your test results, treatment options, and if necessary, the need for more tests. Vaccines  Your health care provider may recommend certain vaccines, such as: Influenza vaccine. This is recommended every year. Tetanus, diphtheria, and acellular pertussis (Tdap, Td) vaccine. You may need a Td booster every 10 years. Zoster vaccine. You may need this after age 34. Pneumococcal 13-valent conjugate (PCV13) vaccine. One dose is recommended  after age 44. Pneumococcal polysaccharide (PPSV23)  vaccine. One dose is recommended after age 18. Talk to your health care provider about which screenings and vaccines you need and how often you need them. This information is not intended to replace advice given to you by your health care provider. Make sure you discuss any questions you have with your health care provider. Document Released: 07/19/2015 Document Revised: 03/11/2016 Document Reviewed: 04/23/2015 Elsevier Interactive Patient Education  2017 [place] Prevention in the Home Falls can cause injuries. They can happen to people of all ages. There are many things you can do to make your home safe and to help prevent falls. What can I do on the outside of my home? Regularly fix the edges of walkways and driveways and fix any cracks. Remove anything that might make you trip as you walk through a door, such as a raised step or threshold. Trim any bushes or trees on the path to your home. Use bright outdoor lighting. Clear any walking paths of anything that might make someone trip, such as rocks or tools. Regularly check to see if handrails are loose or broken. Make sure that both sides of any steps have handrails. Any raised decks and porches should have guardrails on the edges. Have any leaves, snow, or ice cleared regularly. Use sand or salt on walking paths during winter. Clean up any spills in your garage right away. This includes oil or grease spills. What can I do in the bathroom? Use night lights. Install grab bars by the toilet and in the tub and shower. Do not use towel bars as grab bars. Use non-skid mats or decals in the tub or shower. If you need to sit down in the shower, use a plastic, non-slip stool. Keep the floor dry. Clean up any water that spills on the floor as soon as it happens. Remove soap buildup in the tub or shower regularly. Attach bath mats securely with double-sided non-slip rug tape. Do not have throw rugs and other things on the floor that  can make you trip. What can I do in the bedroom? Use night lights. Make sure that you have a light by your bed that is easy to reach. Do not use any sheets or blankets that are too big for your bed. They should not hang down onto the floor. Have a firm chair that has side arms. You can use this for support while you get dressed. Do not have throw rugs and other things on the floor that can make you trip. What can I do in the kitchen? Clean up any spills right away. Avoid walking on wet floors. Keep items that you use a lot in easy-to-reach places. If you need to reach something above you, use a strong step stool that has a grab bar. Keep electrical cords out of the way. Do not use floor polish or wax that makes floors slippery. If you must use wax, use non-skid floor wax. Do not have throw rugs and other things on the floor that can make you trip. What can I do with my stairs? Do not leave any items on the stairs. Make sure that there are handrails on both sides of the stairs and use them. Fix handrails that are broken or loose. Make sure that handrails are as long as the stairways. Check any carpeting to make sure that it is firmly attached to the stairs. Fix any carpet that is loose or worn. Avoid having throw  rugs at the top or bottom of the stairs. If you do have throw rugs, attach them to the floor with carpet tape. Make sure that you have a light switch at the top of the stairs and the bottom of the stairs. If you do not have them, ask someone to add them for you. What else can I do to help prevent falls? Wear shoes that: Do not have high heels. Have rubber bottoms. Are comfortable and fit you well. Are closed at the toe. Do not wear sandals. If you use a stepladder: Make sure that it is fully opened. Do not climb a closed stepladder. Make sure that both sides of the stepladder are locked into place. Ask someone to hold it for you, if possible. Clearly mark and make sure that you  can see: Any grab bars or handrails. First and last steps. Where the edge of each step is. Use tools that help you move around (mobility aids) if they are needed. These include: Canes. Walkers. Scooters. Crutches. Turn on the lights when you go into a dark area. Replace any light bulbs as soon as they burn out. Set up your furniture so you have a clear path. Avoid moving your furniture around. If any of your floors are uneven, fix them. If there are any pets around you, be aware of where they are. Review your medicines with your doctor. Some medicines can make you feel dizzy. This can increase your chance of falling. Ask your doctor what other things that you can do to help prevent falls. This information is not intended to replace advice given to you by your health care provider. Make sure you discuss any questions you have with your health care provider. Document Released: 04/18/2009 Document Revised: 11/28/2015 Document Reviewed: 07/27/2014 Elsevier Interactive Patient Education  2017 [employer].

## 2021-03-05 ENCOUNTER — Encounter: Payer: Self-pay | Admitting: Family Medicine

## 2021-03-13 ENCOUNTER — Other Ambulatory Visit: Payer: Self-pay

## 2021-03-13 ENCOUNTER — Encounter: Payer: Self-pay | Admitting: Family Medicine

## 2021-03-13 ENCOUNTER — Ambulatory Visit (INDEPENDENT_AMBULATORY_CARE_PROVIDER_SITE_OTHER): Payer: Medicare HMO | Admitting: Family Medicine

## 2021-03-13 VITALS — BP 110/60 | HR 68 | Temp 97.9°F | Resp 20 | Ht 62.0 in | Wt 176.2 lb

## 2021-03-13 DIAGNOSIS — R350 Frequency of micturition: Secondary | ICD-10-CM

## 2021-03-13 LAB — POC URINALSYSI DIPSTICK (AUTOMATED)
Bilirubin, UA: NEGATIVE
Blood, UA: NEGATIVE
Glucose, UA: NEGATIVE
Ketones, UA: NEGATIVE
Nitrite, UA: NEGATIVE
Protein, UA: NEGATIVE
Spec Grav, UA: 1.025 (ref 1.010–1.025)
Urobilinogen, UA: 0.2 E.U./dL
pH, UA: 6 (ref 5.0–8.0)

## 2021-03-13 MED ORDER — CIPROFLOXACIN HCL 500 MG PO TABS: 500.0000 mg | ORAL_TABLET | Freq: Two times a day (BID) | ORAL | 0 refills | Status: AC

## 2021-03-13 NOTE — Patient Instructions (Signed)

## 2021-03-13 NOTE — Progress Notes (Signed)
Established Patient Office Visit  Subjective:  Patient ID: Felicia Lowe, female    DOB: July 26, 1940  Age: 80 y.o. MRN: [id number]  CC:  Chief Complaint  Patient presents with   Urinary Frequency    Pt states freq started Monday and states having dizziness and fatigue, and nausea and chills.    [place] presents for possible uti-----  frequency, chills, fatigue and dizziness.   This is typical when she gets a uti   no fevers   Past Medical History:  Diagnosis Date   Allergy    Arthritis    osteoarthritis, severe, b/l knees &b/l hips  replaced   BPV (benign positional vertigo) 02/19/2013   mild   Chicken pox as a child   GERD (gastroesophageal reflux disease) 05/27/2014   H/O breast biopsy    benign, on left   Hypertension    Kidney stone 2000   nephritis as a child   Measles as a child   Mumps as a child   Obesity    Osteoporosis    Preventative health care 11/25/2014   Pruritus 01/21/2017   Scabies    Skin lesion of face 05/18/2012   Thyroid disease    Unsteady gait 11/25/2014   UTI (urinary tract infection) 12/11/2012   UTI (urinary tract infection)    Viral respiratory illness 05/21/2013   Vitamin D deficiency     Past Surgical History:  Procedure Laterality Date   CATARACT EXTRACTION Left 2017   EYE SURGERY Left 2019   cataract removal left   HIP SURGERY     both hips   KNEE SURGERY     both knees replaced   LITHOTRIPSY     For kidney stones    Family History  Problem Relation Age of Onset   Hyperlipidemia Mother    Hypertension Mother    Heart attack Mother    Other Mother        goiter problems   Cancer Father        lung- smoker   Heart attack Father        X 2   Thyroid disease Brother    Hypertension Son    Rashes / Skin problems Son    Hernia Son        umbilical   Cancer Paternal Grandmother    Macular degeneration Brother    Breast cancer Maternal Aunt    Colon cancer Neg Hx    Esophageal cancer Neg Hx    Rectal cancer Neg  Hx    Stomach cancer Neg Hx     Social History   Socioeconomic History   Marital status: Married    Spouse name: Not on file   Number of children: Not on file   Years of education: Not on file   Highest education level: Not on file  Occupational History   Occupation: Retired Art therapist  Tobacco Use   Smoking status: Never   Smokeless tobacco: Never  Vaping Use   Vaping Use: Never used  Substance and Sexual Activity   Alcohol use: Not Currently    Comment: Rarely glass of wine   Drug use: No   Sexual activity: Never  Other Topics Concern   Not on file  Social History Narrative   Not on file   Social Determinants of Health   Financial Resource Strain: Low Risk    Difficulty of Paying Living Expenses: Not hard at all  Food Insecurity: No Food Insecurity   Worried  About Running Out of Food in the Last Year: Never true   Ran Out of Food in the Last Year: Never true  Transportation Needs: No Transportation Needs   Lack of Transportation (Medical): No   Lack of Transportation (Non-Medical): No  Physical Activity: Insufficiently Active   Days of Exercise per Week: 7 days   Minutes of Exercise per Session: 20 min  Stress: No Stress Concern Present   Feeling of Stress : Not at all  Social Connections: Moderately Isolated   Frequency of Communication with Friends and Family: More than three times a week   Frequency of Social Gatherings with Friends and Family: Once a week   Attends Religious Services: Never   Marine scientist or Organizations: No   Attends Music therapist: Never   Marital Status: Married  Human resources officer Violence: Not At Risk   Fear of Current or Ex-Partner: No   Emotionally Abused: No   Physically Abused: No   Sexually Abused: No    Outpatient Medications Prior to Visit  Medication Sig Dispense Refill   Ascorbic Acid (VITAMIN C) 1000 MG tablet Take 1,000 mg by mouth daily.     aspirin 81 MG tablet Take 81 mg by mouth daily.      Cholecalciferol (VITAMIN D) 2000 UNITS tablet Take 2,000 Units by mouth daily.     Coenzyme Q10 (CO Q 10) 100 MG CAPS Take 400 mg by mouth daily.     lisinopril-hydrochlorothiazide (ZESTORETIC) 10-12.5 MG tablet Take 1 tablet by mouth daily. 90 tablet 3   Omega-3 Fatty Acids (FISH OIL) 1000 MG CPDR Take 1 capsule by mouth daily.     Probiotic Product (PROBIOTIC DAILY PO) Take by mouth.     SYNTHROID 125 MCG tablet TAKE 1 TABLET (125 MCG TOTAL) BY MOUTH DAILY. SYNTHROID 90 tablet 3   No facility-administered medications prior to visit.    Allergies  Allergen Reactions   Eggs Or Egg-Derived Products Shortness Of Breath and Rash   Sulfa Antibiotics Nausea And Vomiting   Keflex [Cephalexin] Rash    Rash    Penicillins Rash   Poison Ivy Extract [Poison Ivy Extract] Rash    ROS Review of Systems  Constitutional:  Negative for appetite change, diaphoresis, fatigue and unexpected weight change.  Eyes:  Negative for pain, redness and visual disturbance.  Respiratory:  Negative for cough, chest tightness, shortness of breath and wheezing.   Cardiovascular:  Negative for chest pain, palpitations and leg swelling.  Endocrine: Negative for cold intolerance, heat intolerance, polydipsia, polyphagia and polyuria.  Genitourinary:  Positive for dysuria and frequency. Negative for decreased urine volume, difficulty urinating, urgency, vaginal bleeding, vaginal discharge and vaginal pain.  Neurological:  Negative for dizziness, light-headedness, numbness and headaches.     Objective:    Physical Exam Vitals and nursing note reviewed.  Constitutional:      Appearance: She is well-developed.  HENT:     Head: Normocephalic and atraumatic.  Eyes:     Conjunctiva/sclera: Conjunctivae normal.  Neck:     Thyroid: No thyromegaly.     Vascular: No carotid bruit or JVD.  Cardiovascular:     Rate and Rhythm: Normal rate and regular rhythm.     Heart sounds: Normal heart sounds. No murmur  heard. Pulmonary:     Effort: Pulmonary effort is normal. No respiratory distress.     Breath sounds: Normal breath sounds. No wheezing or rales.  Chest:     Chest wall: No tenderness.  Musculoskeletal:     Cervical back: Normal range of motion and neck supple.  Neurological:     Mental Status: She is alert and oriented to person, place, and time.    BP 110/60 (BP Location: Left Arm, Patient Position: Sitting, Cuff Size: Large)   Pulse 68   Temp 97.9 F (36.6 C) (Oral)   Resp 20   Ht '5\' 2"'$  (1.575 m)   Wt 176 lb 3.2 oz (79.9 kg)   SpO2 97%   BMI 32.23 kg/m  Wt Readings from Last 3 Encounters:  03/13/21 176 lb 3.2 oz (79.9 kg)  02/12/21 178 lb 3.2 oz (80.8 kg)  10/23/20 179 lb (81.2 kg)     Health Maintenance Due  Topic Date Due   Zoster Vaccines- Shingrix (2 of 2) 10/18/2018   INFLUENZA VACCINE  02/03/2021   COVID-19 Vaccine (5 - Booster for Pfizer series) 03/15/2021    There are no preventive care reminders to display for this patient.  Lab Results  Component Value Date   TSH 1.17 07/08/2020   Lab Results  Component Value Date   WBC 4.9 07/08/2020   HGB 14.1 07/08/2020   HCT 41.9 07/08/2020   MCV 90.5 07/08/2020   PLT 267.0 07/08/2020   Lab Results  Component Value Date   NA 140 07/08/2020   K 4.3 07/08/2020   CO2 27 07/08/2020   GLUCOSE 88 07/08/2020   BUN 17 07/08/2020   CREATININE 0.81 07/08/2020   BILITOT 0.5 07/08/2020   ALKPHOS 51 07/08/2020   AST 12 07/08/2020   ALT 10 07/08/2020   PROT 6.6 07/08/2020   ALBUMIN 4.3 07/08/2020   CALCIUM 9.5 07/08/2020   ANIONGAP 10 06/29/2019   GFR 69.05 07/08/2020   Lab Results  Component Value Date   CHOL 158 07/08/2020   Lab Results  Component Value Date   HDL 49.10 07/08/2020   Lab Results  Component Value Date   LDLCALC 80 07/08/2020   Lab Results  Component Value Date   TRIG 142.0 07/08/2020   Lab Results  Component Value Date   CHOLHDL 3 07/08/2020   Lab Results  Component Value  Date   HGBA1C 5.7 07/08/2020   + leuks on UA    Assessment & Plan:   Problem List Items Addressed This Visit   None Visit Diagnoses     Urinary frequency    -  Primary   Relevant Medications   ciprofloxacin (CIPRO) 500 MG tablet   Other Relevant Orders   POCT Urinalysis Dipstick (Automated) (Completed)   Urine Culture       Meds ordered this encounter  Medications   ciprofloxacin (CIPRO) 500 MG tablet    Sig: Take 1 tablet (500 mg total) by mouth 2 (two) times daily for 3 days.    Dispense:  10 tablet    Refill:  0    Follow-up: Return if symptoms worsen or fail to improve.    Ann Held, DO

## 2021-03-15 LAB — URINE CULTURE
MICRO NUMBER:: 12347477
SPECIMEN QUALITY:: ADEQUATE

## 2021-04-07 ENCOUNTER — Other Ambulatory Visit: Payer: Self-pay | Admitting: Family Medicine

## 2021-04-07 ENCOUNTER — Encounter: Payer: Self-pay | Admitting: Family Medicine

## 2021-04-07 MED ORDER — ONDANSETRON HCL 4 MG PO TABS: 4.0000 mg | ORAL_TABLET | Freq: Three times a day (TID) | ORAL | 0 refills | Status: DC | PRN

## 2021-04-09 ENCOUNTER — Encounter: Payer: Self-pay | Admitting: Family Medicine

## 2021-04-10 NOTE — Telephone Encounter (Signed)
Called pt. Lvm to call back. Pt needs scheduled

## 2021-04-14 NOTE — Telephone Encounter (Signed)
Never answered

## 2021-07-19 ENCOUNTER — Encounter: Payer: Self-pay | Admitting: Family Medicine

## 2021-07-19 ENCOUNTER — Telehealth: Payer: Medicare HMO | Admitting: Nurse Practitioner

## 2021-07-19 DIAGNOSIS — U071 COVID-19: Secondary | ICD-10-CM | POA: Diagnosis not present

## 2021-07-19 MED ORDER — MOLNUPIRAVIR EUA 200MG CAPSULE: 4.0000 | ORAL_CAPSULE | Freq: Two times a day (BID) | ORAL | 0 refills | Status: AC

## 2021-07-19 NOTE — Progress Notes (Signed)
Virtual Visit Consent   Felicia Lowe, you are scheduled for a virtual visit with Felicia Lowe, [place], a [facility] provider, today.     Just as with appointments in the office, your consent must be obtained to participate.  Your consent will be active for this visit and any virtual visit you may have with one of our providers in the next 365 days.     If you have a MyChart account, a copy of this consent can be sent to you electronically.  All virtual visits are billed to your insurance company just like a traditional visit in the office.    As this is a virtual visit, video technology does not allow for your provider to perform a traditional examination.  This may limit your provider's ability to fully assess your condition.  If your provider identifies any concerns that need to be evaluated in person or the need to arrange testing (such as labs, EKG, etc.), we will make arrangements to do so.     Although advances in technology are sophisticated, we cannot ensure that it will always work on either your end or our end.  If the connection with a video visit is poor, the visit may have to be switched to a telephone visit.  With either a video or telephone visit, we are not always able to ensure that we have a secure connection.     I need to obtain your verbal consent now.   Are you willing to proceed with your visit today? YES   Felicia Lowe has provided verbal consent on 07/19/2021 for a virtual visit (video or telephone).   Felicia Hassell Done, FNP   Date: 07/19/2021 12:15 PM   Virtual Visit via Video Note   I, Felicia Lowe, connected with Felicia Lowe ([id number], [birth year]) on 07/19/21 at 12:30 PM EST by a video-enabled telemedicine application and verified that I am speaking with the correct person using two identifiers.  Location: Patient: Virtual Visit Location Patient: Home Provider: Virtual Visit Location Provider: Mobile   I discussed the limitations of  evaluation and management by telemedicine and the availability of in person appointments. The patient expressed understanding and agreed to proceed.    History of Present Illness: Felicia Lowe is a 81 y.o. who identifies as a female who was assigned female at birth, and is being seen today for covid positive.  HPI: URI  This is a new problem. The current episode started in the past 7 days. The problem has been gradually worsening. There has been no fever. Associated symptoms include congestion, coughing, headaches, rhinorrhea and a sore throat. Pertinent negatives include no sinus pain. She has tried acetaminophen for the symptoms. The treatment provided mild relief.  Tested positive for covid this morning Review of Systems  HENT:  Positive for congestion, rhinorrhea and sore throat. Negative for sinus pain.   Respiratory:  Positive for cough.   Neurological:  Positive for headaches.   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   Hyperglycemia 05/25/2017   Atypical chest pain 04/04/2017   Pedal edema 04/04/2017   Preventative health care 11/25/2014   Unsteady gait 11/25/2014   Dizziness 09/10/2014   GERD (gastroesophageal reflux disease) 05/27/2014   Dermatitis 01/21/2014   Allergic drug rash 03/17/2013   Intertrigo 03/17/2013   BPV (benign positional vertigo) 02/19/2013   Urinary tract infection  without hematuria 12/11/2012   Medicare annual wellness visit, subsequent 02/08/2012   Thyroid disease    Essential hypertension    Vitamin D deficiency    Mumps    Kidney stone    Obesity    Allergic state    Arthritis    H/O breast biopsy     Allergies:  Allergies  Allergen Reactions   Eggs Or Egg-Derived Products Shortness Of Breath and Rash   Sulfa Antibiotics Nausea And Vomiting   Keflex [Cephalexin] Rash    Rash    Penicillins Rash    Poison Ivy Extract [Poison Ivy Extract] Rash   Medications:  Current Outpatient Medications:    Ascorbic Acid (VITAMIN C) 1000 MG tablet, Take 1,000 mg by mouth daily., Disp: , Rfl:    aspirin 81 MG tablet, Take 81 mg by mouth daily., Disp: , Rfl:    Cholecalciferol (VITAMIN D) 2000 UNITS tablet, Take 2,000 Units by mouth daily., Disp: , Rfl:    Coenzyme Q10 (CO Q 10) 100 MG CAPS, Take 400 mg by mouth daily., Disp: , Rfl:    lisinopril-hydrochlorothiazide (ZESTORETIC) 10-12.5 MG tablet, Take 1 tablet by mouth daily., Disp: 90 tablet, Rfl: 3   Omega-3 Fatty Acids (FISH OIL) 1000 MG CPDR, Take 1 capsule by mouth daily., Disp: , Rfl:    ondansetron (ZOFRAN) 4 MG tablet, Take 1 tablet (4 mg total) by mouth every 8 (eight) hours as needed for nausea or vomiting., Disp: 20 tablet, Rfl: 0   Probiotic Product (PROBIOTIC DAILY PO), Take by mouth., Disp: , Rfl:    SYNTHROID 125 MCG tablet, TAKE 1 TABLET (125 MCG TOTAL) BY MOUTH DAILY. SYNTHROID, Disp: 90 tablet, Rfl: 3  Observations/Objective: Patient is well-developed, well-nourished in no acute distress.  Resting comfortably  at home.  Head is normocephalic, atraumatic.  No labored breathing.  Speech is clear and coherent with logical content.  Patient is alert and oriented at baseline.    Assessment and Plan:  Felicia Lowe in today with chief complaint of Covid Positive   1. Lab test positive for detection of COVID-19 virus 1. Take meds as prescribed 2. Use a cool mist humidifier especially during the winter months and when heat has been humid. 3. Use saline nose sprays frequently 4. Saline irrigations of the nose can be very helpful if Lowe frequently.  * 4X daily for 1 week*  * Use of a nettie pot can be helpful with this. Follow directions with this* 5. Drink plenty of fluids 6. Keep thermostat turn down low 7.For any cough or congestion- delsym or mucinex 8. For fever or aces or pains- take tylenol or ibuprofen appropriate for age  and weight.  * for fevers greater than 101 orally you may alternate ibuprofen and tylenol every  3 hours.    Follow Up Instructions: I discussed the assessment and treatment plan with the patient. The patient was provided an opportunity to ask questions and all were answered. The patient agreed with the plan and demonstrated an understanding of the instructions.  A copy of instructions were sent to the patient via MyChart.  The patient was advised to call back or seek an in-person evaluation if the symptoms worsen or if the condition fails to improve as anticipated.  Time:  I spent 8 minutes with the patient via telehealth technology discussing the above problems/concerns.    Felicia Hassell Done, FNP

## 2021-07-19 NOTE — Patient Instructions (Signed)
You are being prescribed MOLNUPIRAVIR for COVID-19 infection.  ° ° °Please call the pharmacy or go through the drive through vs going inside if you are picking up the mediation yourself to prevent further spread. If prescribed to a [facility] affiliated pharmacy, a pharmacist will bring the medication out to your car. ° ° °ADMINISTRATION INSTRUCTIONS: °Take with or without food. Swallow the tablets whole. Don't chew, crush, or break the medications because it might not work as well ° °For each dose of the medication, you should be taking FOUR tablets at one time, TWICE a day  ° °Finish your full five-day course of Molnupiravir even if you feel better before you're done. Stopping this medication too early can make it less effective to prevent severe illness related to COVID19.   ° °Molnupiravir is prescribed for YOU ONLY. Don't share it with others, even if they have similar symptoms as you. This medication might not be right for everyone.  ° °Make sure to take steps to protect yourself and others while you're taking this medication in order to get well soon and to prevent others from getting sick with COVID-19. ° ° °**If you are of childbearing potential (any gender) - it is advised to not get pregnant while taking this medication and recommended that condoms are used for female partners the next 3 months after taking the medication out of extreme caution  ° ° °COMMON SIDE EFFECTS: °Diarrhea °Nausea  °Dizziness ° ° ° °If your COVID-19 symptoms get worse, get medical help right away. Call 911 if you experience symptoms such as worsening cough, trouble breathing, chest pain that doesn't go away, confusion, a hard time staying awake, and pale or blue-colored skin. °This medication won't prevent all COVID-19 cases from getting worse.  ° ° °

## 2021-07-21 NOTE — Telephone Encounter (Signed)
Called patient to get her scheduled for VV today and she stated she already has medication from another provider

## 2021-07-22 ENCOUNTER — Encounter: Payer: Medicare HMO | Admitting: Family Medicine

## 2021-07-24 ENCOUNTER — Other Ambulatory Visit: Payer: Self-pay | Admitting: Family Medicine

## 2021-07-24 DIAGNOSIS — I10 Essential (primary) hypertension: Secondary | ICD-10-CM

## 2021-08-25 ENCOUNTER — Encounter: Payer: Medicare HMO | Admitting: Family Medicine

## 2021-09-01 ENCOUNTER — Encounter: Payer: Medicare HMO | Admitting: Family Medicine

## 2021-09-10 NOTE — Progress Notes (Deleted)
Therapist, music at [facility] ?[address], [address] ?[place], [place] [address] ?336 [phone] ?Fax [phone] ? ?Date:  09/15/2021  ? ?Name:  Felicia Lowe   DOB:  06-23-1941   MRN:  [id number] ? ?PCP:  Felicia Lukes, MD  ? ? ?Chief Complaint: No chief complaint on file. ? ? ?History of Present Illness: ? ?Felicia Lowe is a 81 y.o. very pleasant female patient who presents with the following: ? ?Patient seen today for a physical-her PCP Dr. Charlett Lowe is at right now for a family emergency ?Last seen by myself about 1 year ago for UTI ?History of prediabetes, hypothyroidism, hypertension, Fuchs corneal dystrophy ? ?Baby aspirin ?Lisinopril/HCTZ ?Levothyroxine 125 ? ?Labs are due for update ?Second dose of Shingrix ?Mammogram about 1 year ago ?DEXA scan can be updated ?Patient Active Problem List  ? Diagnosis Date Noted  ? Low back pain 04/04/2019  ? Hyperlipidemia 05/30/2018  ? Osteopenia 05/30/2018  ? 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 Hyperglycemia 05/25/2017  ? Atypical chest pain 04/04/2017  ? Pedal edema 04/04/2017  ? Preventative health care 11/25/2014  ? Unsteady gait 11/25/2014  ? Dizziness 09/10/2014  ? GERD (gastroesophageal reflux disease) 05/27/2014  ? Dermatitis 01/21/2014  ? Allergic drug rash 03/17/2013  ? Intertrigo 03/17/2013  ? BPV (benign positional vertigo) 02/19/2013  ? Urinary tract infection without hematuria 12/11/2012  ? Medicare annual wellness visit, subsequent 02/08/2012  ? Thyroid disease   ? Essential hypertension   ? Vitamin D deficiency   ? Mumps   ? Kidney stone   ? Obesity   ? Allergic state   ? Arthritis   ? H/O breast biopsy   ? ? ?Past Medical History:  ?Diagnosis Date  ? Allergy   ? Arthritis   ? osteoarthritis, severe, b/l knees &b/l hips  replaced  ? BPV (benign positional vertigo) 02/19/2013  ? mild  ? Chicken pox as a child  ? GERD (gastroesophageal reflux disease) 05/27/2014  ? H/O breast biopsy   ? benign, on left  ? Hypertension   ? Kidney stone 2000   ? nephritis as a child  ? Measles as a child  ? Mumps as a child  ? Obesity   ? Osteoporosis   ? Preventative health care 11/25/2014  ? Pruritus 01/21/2017  ? Scabies   ? Skin lesion of face 05/18/2012  ? Thyroid disease   ? Unsteady gait 11/25/2014  ? UTI (urinary tract infection) 12/11/2012  ? UTI (urinary tract infection)   ? Viral respiratory illness 05/21/2013  ? Vitamin D deficiency   ? ? ?Past Surgical History:  ?Procedure Laterality Date  ? CATARACT EXTRACTION Left 2017  ? EYE SURGERY Left 2019  ? cataract removal left  ? HIP SURGERY    ? both hips  ? KNEE SURGERY    ? both knees replaced  ? LITHOTRIPSY    ? For kidney stones  ? ? ?Social History  ? ?Tobacco Use  ? Smoking status: Never  ? Smokeless tobacco: Never  ?Vaping Use  ? Vaping Use: Never used  ?Substance Use Topics  ? Alcohol use: Not Currently  ?  Comment: Rarely glass of wine  ? Drug use: No  ? ? ?Family History  ?Problem Relation Age of Onset  ? Hyperlipidemia Mother   ? Hypertension Mother   ? Heart attack Mother   ? Other Mother   ?     goiter problems  ? Cancer Father   ?  lung- smoker  ? Heart attack Father   ?     X 2  ? Thyroid disease Brother   ? Hypertension Son   ? Rashes / Skin problems Son   ? Hernia Son   ?     umbilical  ? Cancer Paternal Grandmother   ? Macular degeneration Brother   ? Breast cancer Maternal Aunt   ? Colon cancer Neg Hx   ? Esophageal cancer Neg Hx   ? Rectal cancer Neg Hx   ? Stomach cancer Neg Hx   ? ? ?Allergies  ?Allergen Reactions  ? Eggs Or Egg-Derived Products Shortness Of Breath and Rash  ? Sulfa Antibiotics Nausea And Vomiting  ? Keflex [Cephalexin] Rash  ?  Rash ?  ? Penicillins Rash  ? Poison Ivy Extract [Poison Ivy Extract] Rash  ? ? ?Medication list has been reviewed and updated. ? ?Current Outpatient Medications on File Prior to Visit  ?Medication Sig Dispense Refill  ? Ascorbic Acid (VITAMIN C) 1000 MG tablet Take 1,000 mg by mouth daily.    ? aspirin 81 MG tablet Take 81 mg by mouth daily.    ?  Cholecalciferol (VITAMIN D) 2000 UNITS tablet Take 2,000 Units by mouth daily.    ? Coenzyme Q10 (CO Q 10) 100 MG CAPS Take 400 mg by mouth daily.    ? lisinopril-hydrochlorothiazide (ZESTORETIC) 10-12.5 MG tablet TAKE 1 TABLET BY MOUTH EVERY DAY 90 tablet 3  ? Omega-3 Fatty Acids (FISH OIL) 1000 MG CPDR Take 1 capsule by mouth daily.    ? ondansetron (ZOFRAN) 4 MG tablet Take 1 tablet (4 mg total) by mouth every 8 (eight) hours as needed for nausea or vomiting. 20 tablet 0  ? Probiotic Product (PROBIOTIC DAILY PO) Take by mouth.    ? SYNTHROID 125 MCG tablet TAKE 1 TABLET (125 MCG TOTAL) BY MOUTH DAILY. SYNTHROID 90 tablet 3  ? ?No current facility-administered medications on file prior to visit.  ? ? ?Review of Systems: ? ?As per HPI- otherwise negative. ? ? ?Physical Examination: ?There were no vitals filed for this visit. ?There were no vitals filed for this visit. ?There is no height or weight on file to calculate BMI. ?Ideal Body Weight:   ? ?GEN: no acute distress. ?HEENT: Atraumatic, Normocephalic.  ?Ears and Nose: No external deformity. ?CV: RRR, No M/G/R. No JVD. No thrill. No extra heart sounds. ?PULM: CTA B, no wheezes, crackles, rhonchi. No retractions. No resp. distress. No accessory muscle use. ?ABD: S, NT, ND, +BS. No rebound. No HSM. ?EXTR: No c/c/e ?PSYCH: Normally interactive. Conversant.  ? ? ?Assessment and Plan: ?*** ?Physical exam today.  Encouraged healthy diet and exercise routine ?Will plan further follow- up pending labs. ? ?Signed ?Lamar Blinks, MD ? ?

## 2021-09-10 NOTE — Patient Instructions (Incomplete)
It was good to see you again today, I will be in touch with your labs as soon as possible ?

## 2021-09-15 ENCOUNTER — Encounter: Payer: Self-pay | Admitting: Family Medicine

## 2021-09-15 ENCOUNTER — Encounter: Payer: Medicare HMO | Admitting: Family Medicine

## 2021-09-15 DIAGNOSIS — R7303 Prediabetes: Secondary | ICD-10-CM

## 2021-09-15 DIAGNOSIS — I10 Essential (primary) hypertension: Secondary | ICD-10-CM

## 2021-09-15 DIAGNOSIS — E785 Hyperlipidemia, unspecified: Secondary | ICD-10-CM

## 2021-09-15 DIAGNOSIS — E079 Disorder of thyroid, unspecified: Secondary | ICD-10-CM

## 2021-09-19 NOTE — Progress Notes (Addendum)
Therapist, music at [employer] [facility] ?[address], [address] ?[place], [place] [phone] [phone] ?Fax [phone] ? ?Date:  09/24/2021  ? ?Name:  Felicia Lowe   DOB:  June 01, 1941   MRN:  [id number] ? ?PCP:  Mosie Lukes, MD  ? ? ?Chief Complaint: Annual Exam ? ? ?History of Present Illness: ? ?Felicia Lowe is a 81 y.o. very pleasant female patient who presents with the following: ? ?Patient seen today for physical exam ?Most recent visit with myself about 1 year ago-we had an appointment last week but patient had to cancel due to her husband's health ?He is 89 yo and had to be admitted to the hospital for a few days -patient notes they are not quite sure what was wrong with him, but he seems to be doing better and is back on ? ?History of prediabetes, hypothyroidism, hypertension, Fuchs corneal dystrophy ? ?Most recent labs about 1 year ago- she is NOT fasting today  ?She notes she is feeing cold a lot- wonders if her thyroid is off  ?She is having to get up a lot to urinate at night, no other urinary symptoms.  We will obtain a urinalysis and urine culture today.  Discussed adding a medication for overactive bladder, she declines for the time being ? ?Levothyroxine 125 ?Lisinopril/HCTZ ?Omega-3 ?Aspirin 81 ?Vitamin D ? ?Mammogram about 1 year ago ?Can update DEXA patient if would like ?She had a positive Cologuard last year-I placed a referral to [facility] gastroenterology- pt reports she was seen and they did a colonoscopy, she had a couple of benign polyps,  no further screening needed  ?Patient Active Problem List  ? Diagnosis Date Noted  ? Low back pain 04/04/2019  ? Hyperlipidemia 05/30/2018  ? Osteopenia 05/30/2018  ? 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 Hyperglycemia 05/25/2017  ? Atypical chest pain 04/04/2017  ? Pedal edema 04/04/2017  ? Preventative health care 11/25/2014  ? Unsteady gait 11/25/2014  ? Dizziness 09/10/2014  ? GERD (gastroesophageal reflux disease) 05/27/2014  ?  Dermatitis 01/21/2014  ? Allergic drug rash 03/17/2013  ? Intertrigo 03/17/2013  ? BPV (benign positional vertigo) 02/19/2013  ? Urinary tract infection without hematuria 12/11/2012  ? Medicare annual wellness visit, subsequent 02/08/2012  ? Thyroid disease   ? Essential hypertension   ? Vitamin D deficiency   ? Mumps   ? Kidney stone   ? Obesity   ? Allergic state   ? Arthritis   ? H/O breast biopsy   ? ? ?Past Medical History:  ?Diagnosis Date  ? Allergy   ? Arthritis   ? osteoarthritis, severe, b/l knees &b/l hips  replaced  ? BPV (benign positional vertigo) 02/19/2013  ? mild  ? Chicken pox as a child  ? GERD (gastroesophageal reflux disease) 05/27/2014  ? H/O breast biopsy   ? benign, on left  ? Hypertension   ? Kidney stone 2000  ? nephritis as a child  ? Measles as a child  ? Mumps as a child  ? Obesity   ? Osteoporosis   ? Preventative health care 11/25/2014  ? Pruritus 01/21/2017  ? Scabies   ? Skin lesion of face 05/18/2012  ? Thyroid disease   ? Unsteady gait 11/25/2014  ? UTI (urinary tract infection) 12/11/2012  ? UTI (urinary tract infection)   ? Viral respiratory illness 05/21/2013  ? Vitamin D deficiency   ? ? ?Past Surgical History:  ?Procedure Laterality Date  ? CATARACT EXTRACTION  Left 2017  ? EYE SURGERY Left 2019  ? cataract removal left  ? HIP SURGERY    ? both hips  ? KNEE SURGERY    ? both knees replaced  ? LITHOTRIPSY    ? For kidney stones  ? ? ?Social History  ? ?Tobacco Use  ? Smoking status: Never  ? Smokeless tobacco: Never  ?Vaping Use  ? Vaping Use: Never used  ?Substance Use Topics  ? Alcohol use: Not Currently  ?  Comment: Rarely glass of wine  ? Drug use: No  ? ? ?Family History  ?Problem Relation Age of Onset  ? Hyperlipidemia Mother   ? Hypertension Mother   ? Heart attack Mother   ? Other Mother   ?     goiter problems  ? Cancer Father   ?     lung- smoker  ? Heart attack Father   ?     X 2  ? Thyroid disease Brother   ? Hypertension Son   ? Rashes / Skin problems Son   ? Hernia Son    ?     umbilical  ? Cancer Paternal Grandmother   ? Macular degeneration Brother   ? Breast cancer Maternal Aunt   ? Colon cancer Neg Hx   ? Esophageal cancer Neg Hx   ? Rectal cancer Neg Hx   ? Stomach cancer Neg Hx   ? ? ?Allergies  ?Allergen Reactions  ? Eggs Or Egg-Derived Products Shortness Of Breath and Rash  ? Sulfa Antibiotics Nausea And Vomiting  ? Keflex [Cephalexin] Rash  ?  Rash ?  ? Penicillins Rash  ? Poison Ivy Extract [Poison Ivy Extract] Rash  ? ? ?Medication list has been reviewed and updated. ? ?Current Outpatient Medications on File Prior to Visit  ?Medication Sig Dispense Refill  ? Ascorbic Acid (VITAMIN C) 1000 MG tablet Take 1,000 mg by mouth daily.    ? aspirin 81 MG tablet Take 81 mg by mouth daily.    ? Cholecalciferol (VITAMIN D) 2000 UNITS tablet Take 2,000 Units by mouth daily.    ? Coenzyme Q10 (CO Q 10) 100 MG CAPS Take 400 mg by mouth daily.    ? lisinopril-hydrochlorothiazide (ZESTORETIC) 10-12.5 MG tablet TAKE 1 TABLET BY MOUTH EVERY DAY 90 tablet 3  ? Omega-3 Fatty Acids (FISH OIL) 1000 MG CPDR Take 1 capsule by mouth daily.    ? ondansetron (ZOFRAN) 4 MG tablet Take 1 tablet (4 mg total) by mouth every 8 (eight) hours as needed for nausea or vomiting. 20 tablet 0  ? Probiotic Product (PROBIOTIC DAILY PO) Take by mouth.    ? SYNTHROID 125 MCG tablet TAKE 1 TABLET (125 MCG TOTAL) BY MOUTH DAILY. SYNTHROID 90 tablet 3  ? ?No current facility-administered medications on file prior to visit.  ? ? ?Review of Systems: ? ?As per HPI- otherwise negative. ? ? ?Physical Examination: ?Vitals:  ? 09/24/21 0851  ?BP: 117/70  ?Pulse: 80  ?Resp: 20  ?SpO2: 99%  ? ?Vitals:  ? 09/24/21 0851  ?Weight: 167 lb 3.2 oz (75.8 kg)  ?Height: '5\' 2"'$  (1.575 m)  ? ?Body mass index is 30.58 kg/m?. ?Ideal Body Weight: Weight in (lb) to have BMI = 25: 136.4 ? ?GEN: no acute distress.  Obese, looks well ?HEENT: Atraumatic, Normocephalic.  Bilateral TM wnl, oropharynx normal.  PEERL,EOMI.   ?Ears and Nose: No  external deformity. ?CV: RRR, No M/G/R. No JVD. No thrill. No extra heart sounds. ?PULM: CTA  B, no wheezes, crackles, rhonchi. No retractions. No resp. distress. No accessory muscle use. ?ABD: S, NT, ND, +BS. No rebound. No HSM. ?EXTR: No c/c/e ?PSYCH: Normally interactive. Conversant.  ? ?Results for orders placed or performed in visit on 09/24/21  ?POCT urinalysis dipstick  ?Result Value Ref Range  ? Color, UA yellow yellow  ? Clarity, UA clear clear  ? Glucose, UA negative negative mg/dL  ? Bilirubin, UA negative negative  ? Ketones, POC UA negative negative mg/dL  ? Spec Grav, UA 1.010 1.010 - 1.025  ? Blood, UA negative negative  ? pH, UA 6.0 5.0 - 8.0  ? Protein Ur, POC negative negative mg/dL  ? Urobilinogen, UA 0.2 0.2 or 1.0 E.U./dL  ? Nitrite, UA Negative Negative  ? Leukocytes, UA Negative Negative  ? ? ?Assessment and Plan: ?Physical exam ? ?Essential hypertension - Plan: CBC, Comprehensive metabolic panel ? ?Hyperlipidemia, unspecified hyperlipidemia type - Plan: Lipid panel ? ?Prediabetes - Plan: Hemoglobin A1c ? ?Hypothyroidism, unspecified type - Plan: TSH ? ?Vitamin D deficiency - Plan: VITAMIN D 25 Hydroxy (Vit-D Deficiency, Fractures) ? ?Estrogen deficiency - Plan: DG Bone Density ? ?Encounter for screening mammogram for malignant neoplasm of breast - Plan: MM 3D SCREEN BREAST BILATERAL ? ?Overactive bladder ? ?Urinary frequency - Plan: Urine Culture, POCT urinalysis dipstick ? ?Patient seen today for physical exam ?Will plan further follow- up pending labs. ?Encourage healthy diet and exercise routine ?Ordered screening mammogram and bone density ?Urine culture pending ?Can adjust thyroid if needed ? ?Reminded that second dose of Shingrix is due, she will plan to do this at her pharmacy ? ?Signed ?Lamar Blinks, MD ? ?Received labs as below, message to patient ? ?Results for orders placed or performed in visit on 09/24/21  ?CBC  ?Result Value Ref Range  ? WBC 5.0 4.0 - 10.5 K/uL  ? RBC 4.57  3.87 - 5.11 Mil/uL  ? Platelets 253.0 150.0 - 400.0 K/uL  ? Hemoglobin 13.7 12.0 - 15.0 g/dL  ? HCT 40.9 36.0 - 46.0 %  ? MCV 89.6 78.0 - 100.0 fl  ? MCHC 33.4 30.0 - 36.0 g/dL  ? RDW 14.3 11.5 - 15.5 %  ?Comprehens

## 2021-09-24 ENCOUNTER — Ambulatory Visit (INDEPENDENT_AMBULATORY_CARE_PROVIDER_SITE_OTHER): Payer: Medicare HMO | Admitting: Family Medicine

## 2021-09-24 ENCOUNTER — Encounter: Payer: Self-pay | Admitting: Family Medicine

## 2021-09-24 ENCOUNTER — Telehealth (HOSPITAL_BASED_OUTPATIENT_CLINIC_OR_DEPARTMENT_OTHER): Payer: Self-pay

## 2021-09-24 VITALS — BP 117/70 | HR 80 | Resp 20 | Ht 62.0 in | Wt 167.2 lb

## 2021-09-24 DIAGNOSIS — Z0000 Encounter for general adult medical examination without abnormal findings: Secondary | ICD-10-CM | POA: Diagnosis not present

## 2021-09-24 DIAGNOSIS — R7303 Prediabetes: Secondary | ICD-10-CM | POA: Diagnosis not present

## 2021-09-24 DIAGNOSIS — N3281 Overactive bladder: Secondary | ICD-10-CM

## 2021-09-24 DIAGNOSIS — I10 Essential (primary) hypertension: Secondary | ICD-10-CM

## 2021-09-24 DIAGNOSIS — E039 Hypothyroidism, unspecified: Secondary | ICD-10-CM

## 2021-09-24 DIAGNOSIS — R350 Frequency of micturition: Secondary | ICD-10-CM | POA: Diagnosis not present

## 2021-09-24 DIAGNOSIS — E785 Hyperlipidemia, unspecified: Secondary | ICD-10-CM

## 2021-09-24 DIAGNOSIS — E559 Vitamin D deficiency, unspecified: Secondary | ICD-10-CM

## 2021-09-24 DIAGNOSIS — E2839 Other primary ovarian failure: Secondary | ICD-10-CM

## 2021-09-24 DIAGNOSIS — Z1231 Encounter for screening mammogram for malignant neoplasm of breast: Secondary | ICD-10-CM

## 2021-09-24 LAB — POCT URINALYSIS DIP (MANUAL ENTRY)
Bilirubin, UA: NEGATIVE
Blood, UA: NEGATIVE
Glucose, UA: NEGATIVE mg/dL
Ketones, POC UA: NEGATIVE mg/dL
Leukocytes, UA: NEGATIVE
Nitrite, UA: NEGATIVE
Protein Ur, POC: NEGATIVE mg/dL
Spec Grav, UA: 1.01 (ref 1.010–1.025)
Urobilinogen, UA: 0.2 E.U./dL
pH, UA: 6 (ref 5.0–8.0)

## 2021-09-24 LAB — COMPREHENSIVE METABOLIC PANEL
ALT: 9 U/L (ref 0–35)
AST: 12 U/L (ref 0–37)
Albumin: 4.1 g/dL (ref 3.5–5.2)
Alkaline Phosphatase: 63 U/L (ref 39–117)
BUN: 14 mg/dL (ref 6–23)
CO2: 29 mEq/L (ref 19–32)
Calcium: 9.4 mg/dL (ref 8.4–10.5)
Chloride: 104 mEq/L (ref 96–112)
Creatinine, Ser: 0.68 mg/dL (ref 0.40–1.20)
GFR: 82.14 mL/min (ref >60.00)
Glucose, Bld: 80 mg/dL (ref 70–99)
Potassium: 3.9 mEq/L (ref 3.5–5.1)
Sodium: 140 mEq/L (ref 135–145)
Total Bilirubin: 0.5 mg/dL (ref 0.2–1.2)
Total Protein: 6.6 g/dL (ref 6.0–8.3)

## 2021-09-24 LAB — LIPID PANEL
Cholesterol: 151 mg/dL (ref 0–200)
HDL: 48.8 mg/dL (ref >39.00)
LDL Cholesterol: 73 mg/dL (ref 0–99)
NonHDL: 102.35
Total CHOL/HDL Ratio: 3
Triglycerides: 148 mg/dL (ref 0.0–149.0)
VLDL: 29.6 mg/dL (ref 0.0–40.0)

## 2021-09-24 LAB — TSH: TSH: 1.23 u[IU]/mL (ref 0.35–5.50)

## 2021-09-24 LAB — VITAMIN D 25 HYDROXY (VIT D DEFICIENCY, FRACTURES): VITD: 86.87 ng/mL (ref 30.00–100.00)

## 2021-09-24 LAB — CBC
HCT: 40.9 % (ref 36.0–46.0)
Hemoglobin: 13.7 g/dL (ref 12.0–15.0)
MCHC: 33.4 g/dL (ref 30.0–36.0)
MCV: 89.6 fl (ref 78.0–100.0)
Platelets: 253 10*3/uL (ref 150.0–400.0)
RBC: 4.57 Mil/uL (ref 3.87–5.11)
RDW: 14.3 % (ref 11.5–15.5)
WBC: 5 10*3/uL (ref 4.0–10.5)

## 2021-09-24 LAB — HEMOGLOBIN A1C: Hgb A1c MFr Bld: 5.8 % (ref 4.6–6.5)

## 2021-09-24 NOTE — Patient Instructions (Signed)
Good to see you today ?I will be in touch with your labs ?Please stop at imaging on the ground floor and schedule your mammogram and bone density ? ?

## 2021-09-25 ENCOUNTER — Encounter: Payer: Self-pay | Admitting: Family Medicine

## 2021-09-25 LAB — URINE CULTURE
MICRO NUMBER:: 13164683
SPECIMEN QUALITY:: ADEQUATE

## 2021-10-27 ENCOUNTER — Encounter: Payer: Self-pay | Admitting: Family Medicine

## 2021-10-27 ENCOUNTER — Ambulatory Visit (HOSPITAL_BASED_OUTPATIENT_CLINIC_OR_DEPARTMENT_OTHER)
Admission: RE | Admit: 2021-10-27 | Discharge: 2021-10-27 | Disposition: A | Discharge status: Home / Self Care | Payer: Medicare HMO | Source: Ambulatory Visit | Attending: Family Medicine | Admitting: Family Medicine

## 2021-10-27 ENCOUNTER — Encounter (HOSPITAL_BASED_OUTPATIENT_CLINIC_OR_DEPARTMENT_OTHER): Payer: Self-pay

## 2021-10-27 DIAGNOSIS — Z1231 Encounter for screening mammogram for malignant neoplasm of breast: Secondary | ICD-10-CM | POA: Insufficient documentation

## 2021-10-27 DIAGNOSIS — E2839 Other primary ovarian failure: Secondary | ICD-10-CM | POA: Insufficient documentation

## 2022-01-19 ENCOUNTER — Encounter: Payer: Self-pay | Admitting: Family Medicine

## 2022-01-19 DIAGNOSIS — R2681 Unsteadiness on feet: Secondary | ICD-10-CM

## 2022-02-06 ENCOUNTER — Encounter (INDEPENDENT_AMBULATORY_CARE_PROVIDER_SITE_OTHER): Payer: Medicare HMO | Admitting: Family Medicine

## 2022-02-06 DIAGNOSIS — R300 Dysuria: Secondary | ICD-10-CM

## 2022-02-06 MED ORDER — NITROFURANTOIN MONOHYD MACRO 100 MG PO CAPS: 100.0000 mg | ORAL_CAPSULE | Freq: Two times a day (BID) | ORAL | 0 refills | Status: AC

## 2022-02-06 NOTE — Telephone Encounter (Signed)
Please see the MyChart message reply(ies) for my assessment and plan.  The patient gave consent for this Medical Advice Message and is aware that it may result in a bill to their insurance company as well as the possibility that this may result in a co-payment or deductible. They are an established patient, but are not seeking medical advice exclusively about a problem treated during an in person or video visit in the last 7 days. I did not recommend an in person or video visit within 7 days of my reply.  I spent a total of 6 minutes cumulative time within 7 days through MyChart messaging Lajoy [place]amburg Paul Onika Gudiel, DO  

## 2022-03-16 ENCOUNTER — Encounter: Payer: Self-pay | Admitting: Family Medicine

## 2022-03-16 ENCOUNTER — Ambulatory Visit (INDEPENDENT_AMBULATORY_CARE_PROVIDER_SITE_OTHER): Payer: Medicare HMO | Admitting: *Deleted

## 2022-03-16 VITALS — BP 121/70 | HR 89 | Ht 62.0 in | Wt 171.4 lb

## 2022-03-16 DIAGNOSIS — Z23 Encounter for immunization: Secondary | ICD-10-CM

## 2022-03-16 DIAGNOSIS — Z0000 Encounter for general adult medical examination without abnormal findings: Secondary | ICD-10-CM

## 2022-03-16 NOTE — Patient Instructions (Signed)
Felicia Lowe , Thank you for taking time to come for your Medicare Wellness Visit. I appreciate your ongoing commitment to your health goals. Please review the following plan we discussed and let me know if I can assist you in the future.   These are the goals we discussed:  Goals      Increase physical activity        This is a list of the screening recommended for you and due dates:  Health Maintenance  Topic Date Due   Zoster (Shingles) Vaccine (2 of 2) 10/18/2018   COVID-19 Vaccine (6 - Pfizer series) 08/29/2021   Tetanus Vaccine  03/06/2026   Pneumonia Vaccine  Completed   Flu Shot  Completed   DEXA scan (bone density measurement)  Completed   HPV Vaccine  Aged Out       Next appointment: Follow up in one year for your annual wellness visit    Preventive Care 33 Years and Older, Female Preventive care refers to lifestyle choices and visits with your health care provider that can promote health and wellness. What does preventive care include? A yearly physical exam. This is also called an annual well check. Dental exams once or twice a year. Routine eye exams. Ask your health care provider how often you should have your eyes checked. Personal lifestyle choices, including: Daily care of your teeth and gums. Regular physical activity. Eating a healthy diet. Avoiding tobacco and drug use. Limiting alcohol use. Practicing safe sex. Taking low-dose aspirin every day. Taking vitamin and mineral supplements as recommended by your health care provider. What happens during an annual well check? The services and screenings done by your health care provider during your annual well check will depend on your age, overall health, lifestyle risk factors, and family history of disease. Counseling  Your health care provider may ask you questions about your: Alcohol use. Tobacco use. Drug use. Emotional well-being. Home and relationship well-being. Sexual activity. Eating  habits. History of falls. Memory and ability to understand (cognition). Work and work Statistician. Reproductive health. Screening  You may have the following tests or measurements: Height, weight, and BMI. Blood pressure. Lipid and cholesterol levels. These may be checked every 5 years, or more frequently if you are over 54 years old. Skin check. Lung cancer screening. You may have this screening every year starting at age 89 if you have a 30-pack-year history of smoking and currently smoke or have quit within the past 15 years. Fecal occult blood test (FOBT) of the stool. You may have this test every year starting at age 33. Flexible sigmoidoscopy or colonoscopy. You may have a sigmoidoscopy every 5 years or a colonoscopy every 10 years starting at age 43. Hepatitis C blood test. Hepatitis B blood test. Sexually transmitted disease (STD) testing. Diabetes screening. This is done by checking your blood sugar (glucose) after you have not eaten for a while (fasting). You may have this done every 1-3 years. Bone density scan. This is done to screen for osteoporosis. You may have this done starting at age 21. Mammogram. This may be done every 1-2 years. Talk to your health care provider about how often you should have regular mammograms. Talk with your health care provider about your test results, treatment options, and if necessary, the need for more tests. Vaccines  Your health care provider may recommend certain vaccines, such as: Influenza vaccine. This is recommended every year. Tetanus, diphtheria, and acellular pertussis (Tdap, Td) vaccine. You may need a  Td booster every 10 years. Zoster vaccine. You may need this after age 25. Pneumococcal 13-valent conjugate (PCV13) vaccine. One dose is recommended after age 54. Pneumococcal polysaccharide (PPSV23) vaccine. One dose is recommended after age 43. Talk to your health care provider about which screenings and vaccines you need and how  often you need them. This information is not intended to replace advice given to you by your health care provider. Make sure you discuss any questions you have with your health care provider. Document Released: 07/19/2015 Document Revised: 03/11/2016 Document Reviewed: 04/23/2015 Elsevier Interactive Patient Education  2017 [employer] Prevention in the Home Falls can cause injuries. They can happen to people of all ages. There are many things you can do to make your home safe and to help prevent falls. What can I do on the outside of my home? Regularly fix the edges of walkways and driveways and fix any cracks. Remove anything that might make you trip as you walk through a door, such as a raised step or threshold. Trim any bushes or trees on the path to your home. Use bright outdoor lighting. Clear any walking paths of anything that might make someone trip, such as rocks or tools. Regularly check to see if handrails are loose or broken. Make sure that both sides of any steps have handrails. Any raised decks and porches should have guardrails on the edges. Have any leaves, snow, or ice cleared regularly. Use sand or salt on walking paths during winter. Clean up any spills in your garage right away. This includes oil or grease spills. What can I do in the bathroom? Use night lights. Install grab bars by the toilet and in the tub and shower. Do not use towel bars as grab bars. Use non-skid mats or decals in the tub or shower. If you need to sit down in the shower, use a plastic, non-slip stool. Keep the floor dry. Clean up any water that spills on the floor as soon as it happens. Remove soap buildup in the tub or shower regularly. Attach bath mats securely with double-sided non-slip rug tape. Do not have throw rugs and other things on the floor that can make you trip. What can I do in the bedroom? Use night lights. Make sure that you have a light by your bed that is easy to  reach. Do not use any sheets or blankets that are too big for your bed. They should not hang down onto the floor. Have a firm chair that has side arms. You can use this for support while you get dressed. Do not have throw rugs and other things on the floor that can make you trip. What can I do in the kitchen? Clean up any spills right away. Avoid walking on wet floors. Keep items that you use a lot in easy-to-reach places. If you need to reach something above you, use a strong step stool that has a grab bar. Keep electrical cords out of the way. Do not use floor polish or wax that makes floors slippery. If you must use wax, use non-skid floor wax. Do not have throw rugs and other things on the floor that can make you trip. What can I do with my stairs? Do not leave any items on the stairs. Make sure that there are handrails on both sides of the stairs and use them. Fix handrails that are broken or loose. Make sure that handrails are as long as the stairways. Check any  carpeting to make sure that it is firmly attached to the stairs. Fix any carpet that is loose or worn. Avoid having throw rugs at the top or bottom of the stairs. If you do have throw rugs, attach them to the floor with carpet tape. Make sure that you have a light switch at the top of the stairs and the bottom of the stairs. If you do not have them, ask someone to add them for you. What else can I do to help prevent falls? Wear shoes that: Do not have high heels. Have rubber bottoms. Are comfortable and fit you well. Are closed at the toe. Do not wear sandals. If you use a stepladder: Make sure that it is fully opened. Do not climb a closed stepladder. Make sure that both sides of the stepladder are locked into place. Ask someone to hold it for you, if possible. Clearly mark and make sure that you can see: Any grab bars or handrails. First and last steps. Where the edge of each step is. Use tools that help you move  around (mobility aids) if they are needed. These include: Canes. Walkers. Scooters. Crutches. Turn on the lights when you go into a dark area. Replace any light bulbs as soon as they burn out. Set up your furniture so you have a clear path. Avoid moving your furniture around. If any of your floors are uneven, fix them. If there are any pets around you, be aware of where they are. Review your medicines with your doctor. Some medicines can make you feel dizzy. This can increase your chance of falling. Ask your doctor what other things that you can do to help prevent falls. This information is not intended to replace advice given to you by your health care provider. Make sure you discuss any questions you have with your health care provider. Document Released: 04/18/2009 Document Revised: 11/28/2015 Document Reviewed: 07/27/2014 Elsevier Interactive Patient Education  2017 [employer].

## 2022-03-16 NOTE — Progress Notes (Signed)
Subjective:   Felicia Lowe is a 81 y.o. female who presents for Medicare Annual (Subsequent) preventive examination.  Review of Systems    Defer to PCP Cardiac Risk Factors include: advanced age (>63mn, >>45women);hypertension     Objective:    Today's Vitals   03/16/22 0929  BP: 121/70  Pulse: 89  Weight: 171 lb 6.4 oz (77.7 kg)  Height: '5\' 2"'$  (1.575 m)   Body mass index is 31.35 kg/m.     03/16/2022    9:44 AM 02/12/2021    1:45 PM 10/26/2019   10:19 AM 06/29/2019   11:02 AM 10/24/2018   10:32 AM 10/20/2017    8:42 AM 10/19/2016    7:53 AM  Advanced Directives  Does Patient Have a Medical Advance Directive? Yes Yes No No No No Yes  Type of AParamedicof AHornsbyLiving will HIvaleeLiving will       Does patient want to make changes to medical advance directive? No - Patient declined     Yes (MAU/Ambulatory/Procedural Areas - Information given) Yes (MAU/Ambulatory/Procedural Areas - Information given)  Copy of HGilmerin Chart? No - copy requested No - copy requested       Would patient like information on creating a medical advance directive?   No - Patient declined Yes (ED - Information included in AVS) No - Patient declined      Current Medications (verified) Outpatient Encounter Medications as of 03/16/2022  Medication Sig   Ascorbic Acid (VITAMIN C) 1000 MG tablet Take 1,000 mg by mouth daily.   aspirin 81 MG tablet Take 81 mg by mouth daily.   Cholecalciferol (VITAMIN D) 2000 UNITS tablet Take 2,000 Units by mouth daily.   Coenzyme Q10 (CO Q 10) 100 MG CAPS Take 400 mg by mouth daily.   lisinopril-hydrochlorothiazide (ZESTORETIC) 10-12.5 MG tablet TAKE 1 TABLET BY MOUTH EVERY DAY   Omega-3 Fatty Acids (FISH OIL) 1000 MG CPDR Take 1 capsule by mouth daily.   Probiotic Product (PROBIOTIC DAILY PO) Take by mouth.   SYNTHROID 125 MCG tablet TAKE 1 TABLET (125 MCG TOTAL) BY MOUTH DAILY. SYNTHROID    [DISCONTINUED] ondansetron (ZOFRAN) 4 MG tablet Take 1 tablet (4 mg total) by mouth every 8 (eight) hours as needed for nausea or vomiting.   No facility-administered encounter medications on file as of 03/16/2022.    Allergies (verified) Eggs or egg-derived products, Sulfa antibiotics, Keflex [cephalexin], Penicillins, and Poison ivy extract [poison ivy extract]   History: Past Medical History:  Diagnosis Date   Allergy    Arthritis    osteoarthritis, severe, b/l knees &b/l hips  replaced   BPV (benign positional vertigo) 02/19/2013   mild   Chicken pox as a child   GERD (gastroesophageal reflux disease) 05/27/2014   H/O breast biopsy    benign, on left   Hypertension    Kidney stone 2000   nephritis as a child   Measles as a child   Mumps as a child   Obesity    Osteoporosis    Preventative health care 11/25/2014   Pruritus 01/21/2017   Scabies    Skin lesion of face 05/18/2012   Thyroid disease    Unsteady gait 11/25/2014   UTI (urinary tract infection) 12/11/2012   UTI (urinary tract infection)    Viral respiratory illness 05/21/2013   Vitamin D deficiency    Past Surgical History:  Procedure Laterality Date   CATARACT EXTRACTION Left 2017  EYE SURGERY Left 2019   cataract removal left   HIP SURGERY     both hips   KNEE SURGERY     both knees replaced   LITHOTRIPSY     For kidney stones   Family History  Problem Relation Age of Onset   Hyperlipidemia Mother    Hypertension Mother    Heart attack Mother    Other Mother        goiter problems   Cancer Father        lung- smoker   Heart attack Father        X 2   Thyroid disease Brother    Hypertension Son    Rashes / Skin problems Son    Hernia Son        umbilical   Cancer Paternal Grandmother    Macular degeneration Brother    Breast cancer Maternal Aunt    Colon cancer Neg Hx    Esophageal cancer Neg Hx    Rectal cancer Neg Hx    Stomach cancer Neg Hx    Social History   Socioeconomic History    Marital status: Married    Spouse name: Not on file   Number of children: Not on file   Years of education: Not on file   Highest education level: Not on file  Occupational History   Occupation: Retired Art therapist  Tobacco Use   Smoking status: Never   Smokeless tobacco: Never  Vaping Use   Vaping Use: Never used  Substance and Sexual Activity   Alcohol use: Not Currently    Comment: Rarely glass of wine   Drug use: No   Sexual activity: Never  Other Topics Concern   Not on file  Social History Narrative   Not on file   Social Determinants of Health   Financial Resource Strain: Low Risk  (02/12/2021)   Overall Financial Resource Strain (CARDIA)    Difficulty of Paying Living Expenses: Not hard at all  Food Insecurity: No Food Insecurity (02/12/2021)   Hunger Vital Sign    Worried About Running Out of Food in the Last Year: Never true    Ran Out of Food in the Last Year: Never true  Transportation Needs: No Transportation Needs (02/12/2021)   PRAPARE - Hydrologist (Medical): No    Lack of Transportation (Non-Medical): No  Physical Activity: Insufficiently Active (02/12/2021)   Exercise Vital Sign    Days of Exercise per Week: 7 days    Minutes of Exercise per Session: 20 min  Stress: No Stress Concern Present (02/12/2021)   [place]    Feeling of Stress : Not at all  Social Connections: Moderately Isolated (02/12/2021)   Social Connection and Isolation Panel [NHANES]    Frequency of Communication with Friends and Family: More than three times a week    Frequency of Social Gatherings with Friends and Family: Once a week    Attends Religious Services: Never    Marine scientist or Organizations: No    Attends Music therapist: Never    Marital Status: Married    Tobacco Counseling Counseling given: Not Answered   Clinical Intake:  Pre-visit  preparation completed: Yes  Pain : No/denies pain     Diabetes: No  How often do you need to have someone help you when you read instructions, pamphlets, or other written materials from your doctor or pharmacy?:  1 - Never   Interpreter Needed?: No  Information entered by :: Beatris Ship, [place]   Activities of Daily Living    03/16/2022    9:50 AM  In your present state of health, do you have any difficulty performing the following activities:  Hearing? 0  Vision? 0  Difficulty concentrating or making decisions? 0  Walking or climbing stairs? 1  Comment back and hip issues  Dressing or bathing? 0  Doing errands, shopping? 0  Preparing Food and eating ? N  Using the Toilet? N  In the past six months, have you accidently leaked urine? Y  Comment occasionally-wears pantyliner  Do you have problems with loss of bowel control? N  Managing your Medications? N  Managing your Finances? N  Housekeeping or managing your Housekeeping? N    Patient Care Team: Mosie Lukes, MD as PCP - General (Family Medicine) Stephannie Li, [place] as Consulting Physician (Ophthalmology) Valinda Party, MD as Consulting Physician (Rheumatology) Harold Hedge, Darrick Grinder, MD as Consulting Physician (Allergy and Immunology)  Indicate any recent Medical Services you may have received from other than [facility] providers in the past year (date may be approximate).     Assessment:   This is a routine wellness examination for Felicia Lowe.  Hearing/Vision screen No results found.  Dietary issues and exercise activities discussed: Current Exercise Habits: The patient does not participate in regular exercise at present (works out in the yard), Exercise limited by: orthopedic condition(s)   Goals Addressed   None    Depression Screen    03/16/2022    9:50 AM 09/24/2021    8:55 AM 02/12/2021    1:48 PM 10/26/2019   10:27 AM 10/24/2018   10:32 AM 10/20/2017    8:42 AM 10/19/2016    7:58 AM  PHQ 2/9 Scores  PHQ  - 2 Score 0 0 1 0 0 0 0    Fall Risk    03/16/2022    9:49 AM 09/24/2021    8:55 AM 02/12/2021    1:47 PM 10/26/2019   10:26 AM 10/24/2018   10:32 AM  Fall Risk   Falls in the past year? 0 0 0 0 0  Number falls in past yr: 0 0 0 0   Injury with Fall? 0 0 0 0   Risk for fall due to : No Fall Risks No Fall Risks     Follow up Falls evaluation completed Falls evaluation completed Falls prevention discussed Education provided;Falls prevention discussed     FALL RISK PREVENTION PERTAINING TO THE HOME:  Any stairs in or around the home? Yes  If so, are there any without handrails? No  Home free of loose throw rugs in walkways, pet beds, electrical cords, etc? Yes  Adequate lighting in your home to reduce risk of falls? Yes   ASSISTIVE DEVICES UTILIZED TO PREVENT FALLS:  Life alert? No  Use of a cane, walker or w/c? No  Grab bars in the bathroom? Yes  Shower chair or bench in shower? Yes  Elevated toilet seat or a handicapped toilet? Yes   TIMED UP AND GO:  Was the test performed? Yes .  Length of time to ambulate 10 feet: 8 sec.   Gait steady and fast without use of assistive device  Cognitive Function:    10/19/2016    7:58 AM  MMSE - Mini Mental State Exam  Orientation to time 5  Orientation to Place 5  Registration 3  Attention/ Calculation 5  Recall 3  Language- name 2 objects 2  Language- repeat 1  Language- follow 3 step command 3  Language- read & follow direction 1  Write a sentence 1  Copy design 0  Total score 29        03/16/2022    9:58 AM  6CIT Screen  What Year? 0 points  What month? 0 points  What time? 0 points  Count back from 20 0 points  Months in reverse 0 points  Repeat phrase 0 points  Total Score 0 points    Immunizations Immunization History  Administered Date(s) Administered   Fluad Quad(high Dose 65+) 03/16/2022   Influenza Inj Mdck Quad Pf 04/07/2019   Influenza, Quadrivalent, Recombinant, Inj, Pf 05/15/2016, 05/25/2017,  05/30/2018   Influenza,trivalent, recombinat, inj, PF 05/17/2014, 05/07/2015   Influenza-Unspecified 05/02/2020, 04/18/2021   PFIZER Comirnaty(Gray Top)Covid-19 Tri-Sucrose Vaccine 11/12/2020   PFIZER(Purple Top)SARS-COV-2 Vaccination 07/15/2019, 08/05/2019, 04/03/2020   Pfizer Covid-19 Vaccine Bivalent Booster 9yr & up 04/28/2021   Pneumococcal Conjugate-13 03/06/2016   Pneumococcal Polysaccharide-23 07/06/2010   Td 03/06/2016   Tdap 07/06/2001   Zoster Recombinat (Shingrix) 08/23/2018   Zoster, Live 04/06/2011    TDAP status: Up to date  Flu Vaccine status: Completed at today's visit  Pneumococcal vaccine status: Up to date  Covid-19 vaccine status: Information provided on how to obtain vaccines.   Qualifies for Shingles Vaccine? Yes   Zostavax completed Yes   Shingrix Completed?: No.    Education has been provided regarding the importance of this vaccine. Patient has been advised to call insurance company to determine out of pocket expense if they have not yet received this vaccine. Advised may also receive vaccine at local pharmacy or Health Dept. Verbalized acceptance and understanding.  Screening Tests Health Maintenance  Topic Date Due   Zoster Vaccines- Shingrix (2 of 2) 10/18/2018   COVID-19 Vaccine (6 - Pfizer series) 08/29/2021   TETANUS/TDAP  03/06/2026   Pneumonia Vaccine 81 Years old  Completed   INFLUENZA VACCINE  Completed   DEXA SCAN  Completed   HPV VACCINES  Aged Out    Health Maintenance  Health Maintenance Due  Topic Date Due   Zoster Vaccines- Shingrix (2 of 2) 10/18/2018   COVID-19 Vaccine (6 - Pfizer series) 08/29/2021    Colorectal cancer screening: No longer required.   Mammogram status: Completed 10/27/21. Repeat every year  Bone Density status: Completed 10/27/21. Results reflect: Bone density results: OSTEOPENIA. Repeat every 2 years.  Lung Cancer Screening: (Low Dose CT Chest recommended if Age 81-80years, 30 pack-year currently  smoking OR have quit w/in 15years.) does not qualify.   Lung Cancer Screening Referral: N/a  Additional Screening:  Hepatitis C Screening: does not qualify; Completed N/a  Vision Screening: Recommended annual ophthalmology exams for early detection of glaucoma and other disorders of the eye. Is the patient up to date with their annual eye exam?  Yes  Who is the provider or what is the name of the office in which the patient attends annual eye exams? Dr. MJackquline DenmarkIf pt is not established with a provider, would they like to be referred to a provider to establish care? No .   Dental Screening: Recommended annual dental exams for proper oral hygiene  Community Resource Referral / Chronic Care Management: CRR required this visit?  No   CCM required this visit?  No      Plan:     I have personally reviewed and noted the following in the patient's  chart:   Medical and social history Use of alcohol, tobacco or illicit drugs  Current medications and supplements including opioid prescriptions. Patient is not currently taking opioid prescriptions. Functional ability and status Nutritional status Physical activity Advanced directives List of other physicians Hospitalizations, surgeries, and ER visits in previous 12 months Vitals Screenings to include cognitive, depression, and falls Referrals and appointments  In addition, I have reviewed and discussed with patient certain preventive protocols, quality metrics, and best practice recommendations. A written personalized care plan for preventive services as well as general preventive health recommendations were provided to patient.     Beatris Ship, [place]   03/16/2022   Nurse Notes: None

## 2022-04-18 ENCOUNTER — Encounter: Payer: Self-pay | Admitting: Family Medicine

## 2022-06-12 IMAGING — MG MM DIGITAL SCREENING BILAT W/ TOMO AND CAD
8 series · 8 of 24 positions shown · non-contrast
Comparison: Previous exam(s).

CLINICAL DATA: Screening.

EXAM:
DIGITAL SCREENING BILATERAL MAMMOGRAM WITH TOMOSYNTHESIS AND CAD
TECHNIQUE: Bilateral screening digital craniocaudal and mediolateral oblique
mammograms were obtained. Bilateral screening digital breast
tomosynthesis was performed. The images were evaluated with
computer-aided detection.

[R MLO synth-2D]
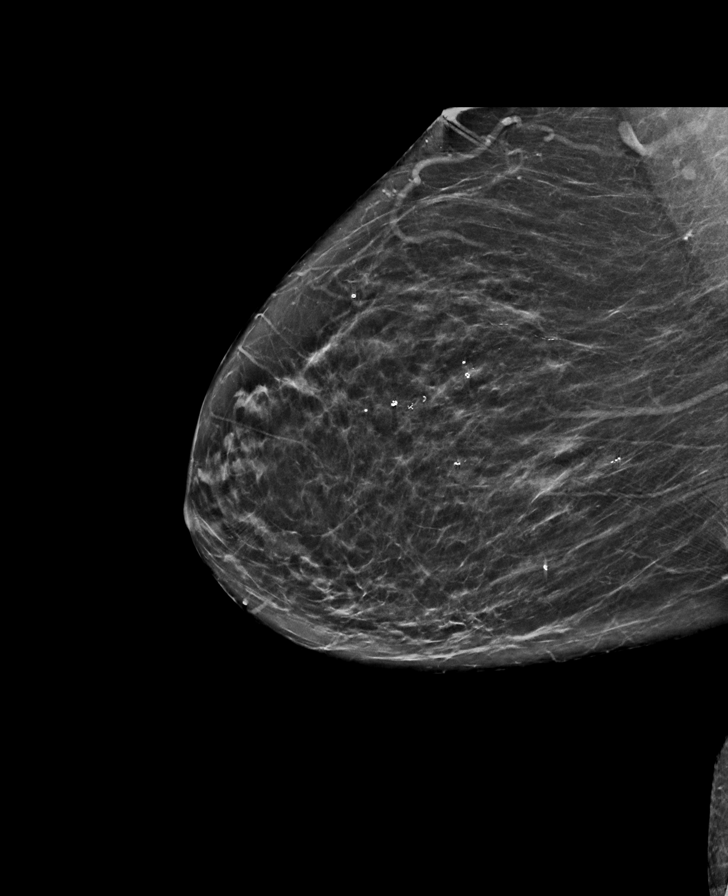

[R CC synth-2D]
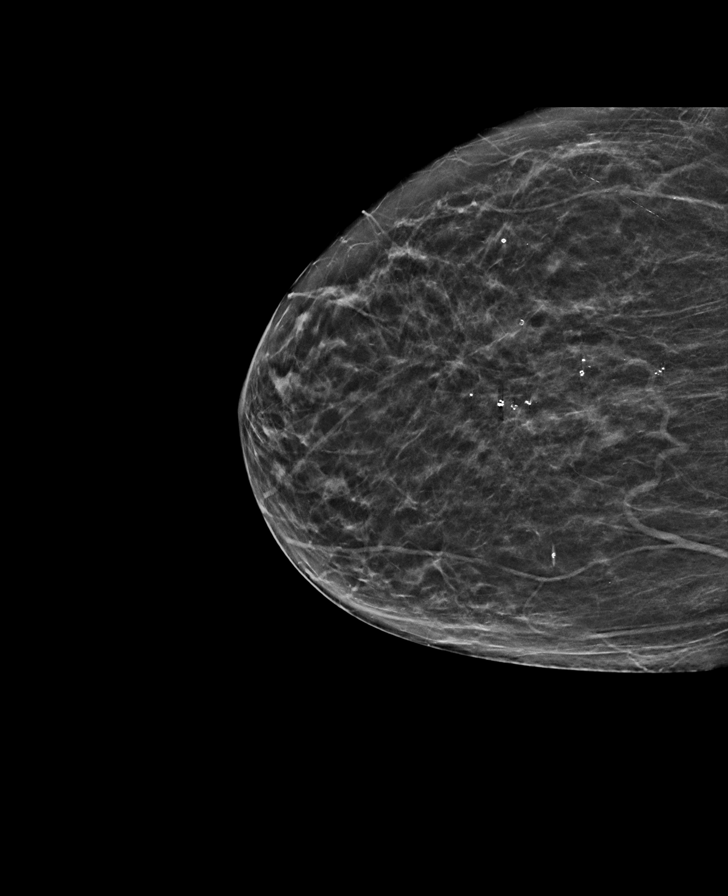

[L MLO synth-2D]
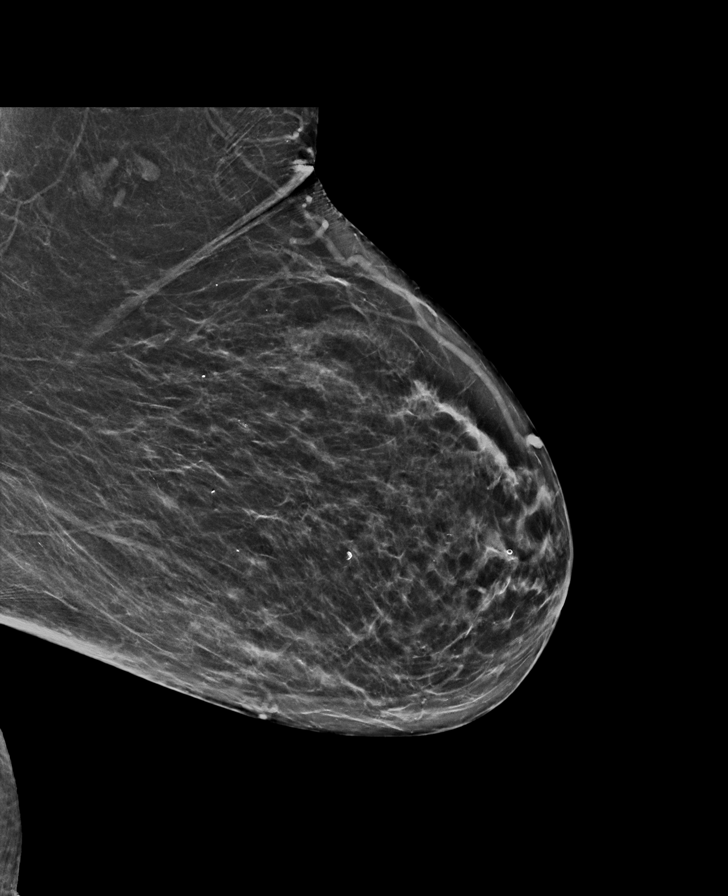

[L CC synth-2D]
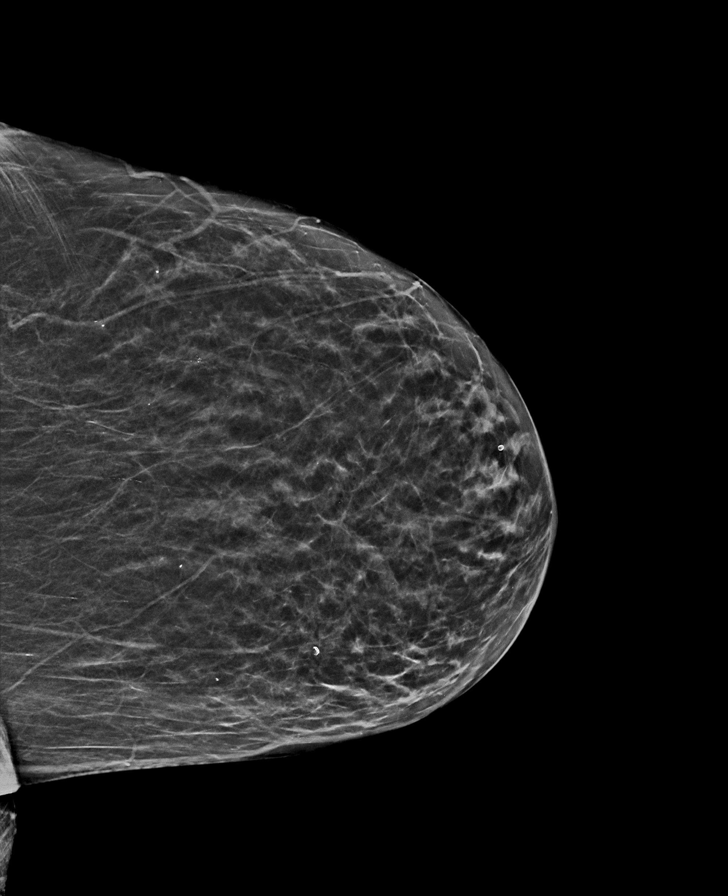

[L CC tomo · tomo slice 25/50.0]
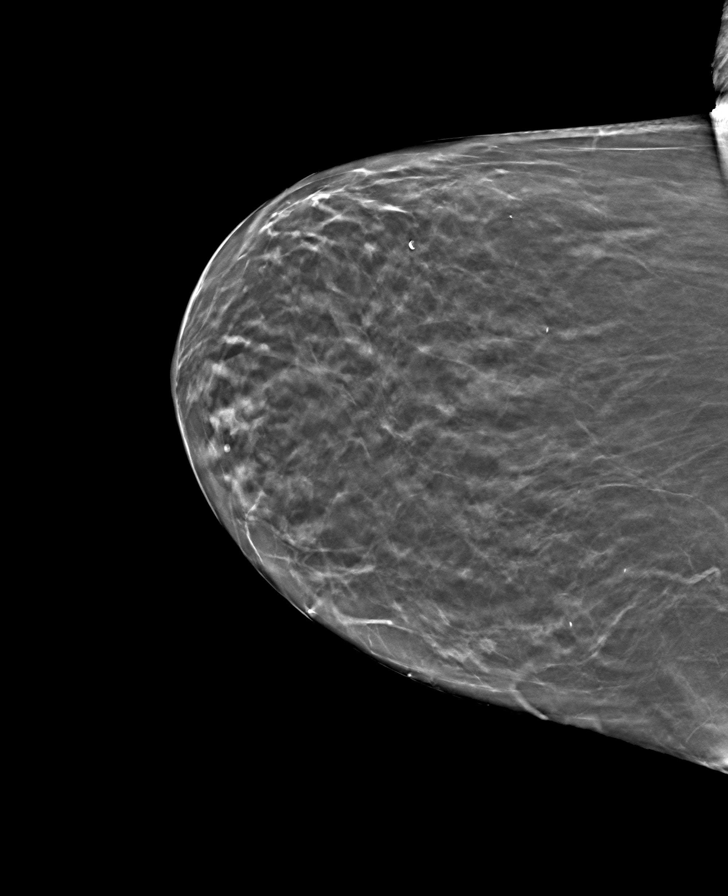

[R CC tomo · tomo slice 25/48.0]
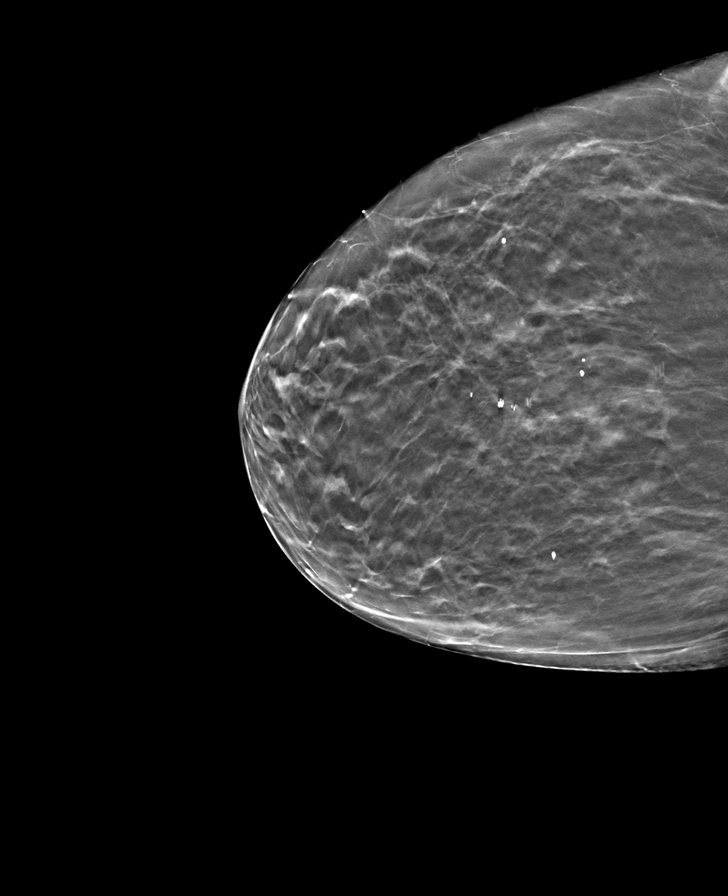

[R MLO tomo · tomo slice 29/58.0]
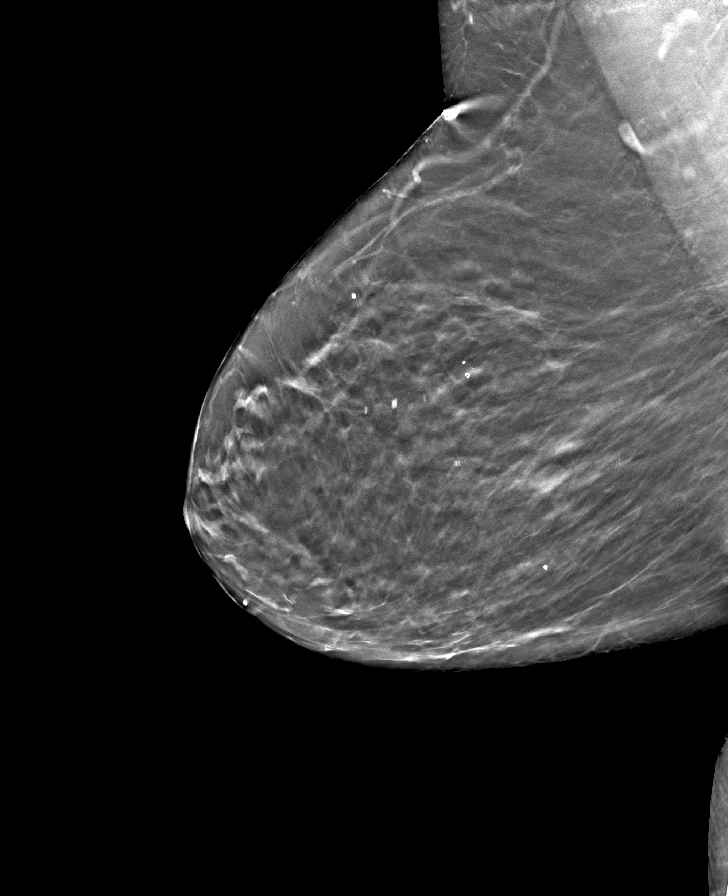

[L MLO tomo · tomo slice 31/61.0]
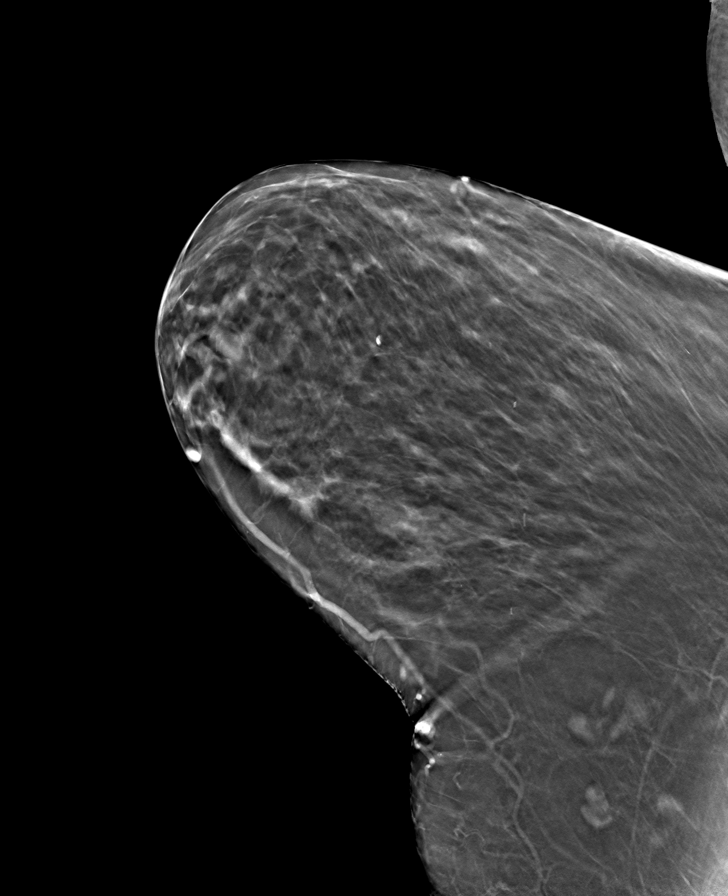

[8 of 24 positions shown; findings below may reference images not displayed]

ACR Breast Density Category b: There are scattered areas of
fibroglandular density.
FINDINGS: There are no findings suspicious for malignancy.
IMPRESSION: No mammographic evidence of malignancy. A result letter of this
screening mammogram will be mailed directly to the patient.

RECOMMENDATION:
Screening mammogram in one year. (Code:51-O-LD2)

BI-RADS CATEGORY  1: Negative.

## 2022-08-07 ENCOUNTER — Other Ambulatory Visit: Payer: Self-pay | Admitting: Family Medicine

## 2022-08-21 NOTE — Progress Notes (Unsigned)
[facility] [address], [place], [place] [address] 336 [id number] [phone]  Date:  08/24/2022   Name:  Tekila Weisman   DOB:  03-13-1941   MRN:  [id number]  PCP:  Mosie Lukes, MD    Chief Complaint: No chief complaint on file.   History of Present Illness:  Aricia Gleba is a 82 y.o. very pleasant female patient who presents with the following:  Pt seen today for a periodic recheck- primary pt of my partner Dr Charlett Blake I have seen her in the past, most recently about one year ago- at that time her 29 yo husband had been admitted with illness  History of prediabetes, hypothyroidism, hypertension, Fuchs corneal dystrophy   2nd dose shingrix  Labs done one year ago   Lisinopril/HCTZ Baby aspirin Levothyroxine  Mammogram, DEXA scan last April   Patient Active Problem List   Diagnosis Date Noted   Low back pain 04/04/2019   Hyperlipidemia 05/30/2018   Osteopenia 05/30/2018   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   Hyperglycemia 05/25/2017   Atypical chest pain 04/04/2017   Pedal edema 04/04/2017   Preventative health care 11/25/2014   Unsteady gait 11/25/2014   Dizziness 09/10/2014   GERD (gastroesophageal reflux disease) 05/27/2014   Dermatitis 01/21/2014   Allergic drug rash 03/17/2013   Intertrigo 03/17/2013   BPV (benign positional vertigo) 02/19/2013   Urinary tract infection without hematuria 12/11/2012   Medicare annual wellness visit, subsequent 02/08/2012   Thyroid disease    Essential hypertension    Vitamin D deficiency    Mumps    Kidney stone    Obesity    Allergic state    Arthritis    H/O breast biopsy     Past Medical History:  Diagnosis Date   Allergy    Arthritis    osteoarthritis, severe, b/l knees &b/l hips  replaced   BPV (benign positional vertigo) 02/19/2013   mild   Chicken pox as a child   GERD (gastroesophageal reflux disease) 05/27/2014   H/O breast biopsy    benign, on left    Hypertension    Kidney stone 2000   nephritis as a child   Measles as a child   Mumps as a child   Obesity    Osteoporosis    Preventative health care 11/25/2014   Pruritus 01/21/2017   Scabies    Skin lesion of face 05/18/2012   Thyroid disease    Unsteady gait 11/25/2014   UTI (urinary tract infection) 12/11/2012   UTI (urinary tract infection)    Viral respiratory illness 05/21/2013   Vitamin D deficiency     Past Surgical History:  Procedure Laterality Date   CATARACT EXTRACTION Left 2017   EYE SURGERY Left 2019   cataract removal left   HIP SURGERY     both hips   KNEE SURGERY     both knees replaced   LITHOTRIPSY     For kidney stones    Social History   Tobacco Use   Smoking status: Never   Smokeless tobacco: Never  Vaping Use   Vaping Use: Never used  Substance Use Topics   Alcohol use: Not Currently    Comment: Rarely glass of wine   Drug use: No    Family History  Problem Relation Age of Onset   Hyperlipidemia Mother    Hypertension Mother    Heart attack Mother    Other Mother  goiter problems   Cancer Father        lung- smoker   Heart attack Father        X 2   Thyroid disease Brother    Hypertension Son    Rashes / Skin problems Son    Hernia Son        umbilical   Cancer Paternal Grandmother    Macular degeneration Brother    Breast cancer Maternal Aunt    Colon cancer Neg Hx    Esophageal cancer Neg Hx    Rectal cancer Neg Hx    Stomach cancer Neg Hx     Allergies  Allergen Reactions   Eggs Or Egg-Derived Products Shortness Of Breath and Rash   Sulfa Antibiotics Nausea And Vomiting   Keflex [Cephalexin] Rash    Rash    Penicillins Rash   Poison Ivy Extract [Poison Ivy Extract] Rash    Medication list has been reviewed and updated.  Current Outpatient Medications on File Prior to Visit  Medication Sig Dispense Refill   Ascorbic Acid (VITAMIN C) 1000 MG tablet Take 1,000 mg by mouth daily.     aspirin 81 MG tablet  Take 81 mg by mouth daily.     Cholecalciferol (VITAMIN D) 2000 UNITS tablet Take 2,000 Units by mouth daily.     Coenzyme Q10 (CO Q 10) 100 MG CAPS Take 400 mg by mouth daily.     lisinopril-hydrochlorothiazide (ZESTORETIC) 10-12.5 MG tablet TAKE 1 TABLET BY MOUTH EVERY DAY 90 tablet 3   Omega-3 Fatty Acids (FISH OIL) 1000 MG CPDR Take 1 capsule by mouth daily.     Probiotic Product (PROBIOTIC DAILY PO) Take by mouth.     SYNTHROID 125 MCG tablet TAKE 1 TABLET (125 MCG TOTAL) BY MOUTH DAILY. SYNTHROID 90 tablet 3   No current facility-administered medications on file prior to visit.    Review of Systems:  As per HPI- otherwise negative.   Physical Examination: There were no vitals filed for this visit. There were no vitals filed for this visit. There is no height or weight on file to calculate BMI. Ideal Body Weight:    GEN: no acute distress. HEENT: Atraumatic, Normocephalic.  Ears and Nose: No external deformity. CV: RRR, No M/G/R. No JVD. No thrill. No extra heart sounds. PULM: CTA B, no wheezes, crackles, rhonchi. No retractions. No resp. distress. No accessory muscle use. ABD: S, NT, ND, +BS. No rebound. No HSM. EXTR: No c/c/e PSYCH: Normally interactive. Conversant.    Assessment and Plan: *** Physical exam today.  Encouraged healthy diet and exercise routine Signed Lamar Blinks, MD

## 2022-08-21 NOTE — Patient Instructions (Incomplete)
It was good to see again today, I will be in touch with your labs soon as possible Recommend second dose of shingles vaccine if not yet completed

## 2022-08-24 ENCOUNTER — Ambulatory Visit (INDEPENDENT_AMBULATORY_CARE_PROVIDER_SITE_OTHER): Payer: Medicare HMO | Admitting: Family Medicine

## 2022-08-24 ENCOUNTER — Encounter: Payer: Self-pay | Admitting: Family Medicine

## 2022-08-24 VITALS — BP 132/76 | HR 75 | Temp 98.1°F | Resp 19 | Ht 62.0 in | Wt 175.0 lb

## 2022-08-24 DIAGNOSIS — I10 Essential (primary) hypertension: Secondary | ICD-10-CM

## 2022-08-24 DIAGNOSIS — R7303 Prediabetes: Secondary | ICD-10-CM | POA: Diagnosis not present

## 2022-08-24 DIAGNOSIS — E039 Hypothyroidism, unspecified: Secondary | ICD-10-CM

## 2022-08-24 DIAGNOSIS — E785 Hyperlipidemia, unspecified: Secondary | ICD-10-CM | POA: Diagnosis not present

## 2022-08-24 LAB — COMPREHENSIVE METABOLIC PANEL
ALT: 9 U/L (ref 0–35)
AST: 13 U/L (ref 0–37)
Albumin: 4.1 g/dL (ref 3.5–5.2)
Alkaline Phosphatase: 59 U/L (ref 39–117)
BUN: 13 mg/dL (ref 6–23)
CO2: 29 mEq/L (ref 19–32)
Calcium: 9.6 mg/dL (ref 8.4–10.5)
Chloride: 103 mEq/L (ref 96–112)
Creatinine, Ser: 0.71 mg/dL (ref 0.40–1.20)
GFR: 79.68 mL/min (ref >60.00)
Glucose, Bld: 88 mg/dL (ref 70–99)
Potassium: 4 mEq/L (ref 3.5–5.1)
Sodium: 140 mEq/L (ref 135–145)
Total Bilirubin: 0.3 mg/dL (ref 0.2–1.2)
Total Protein: 6.8 g/dL (ref 6.0–8.3)

## 2022-08-24 LAB — LIPID PANEL
Cholesterol: 158 mg/dL (ref 0–200)
HDL: 51.3 mg/dL (ref >39.00)
LDL Cholesterol: 80 mg/dL (ref 0–99)
NonHDL: 106.29
Total CHOL/HDL Ratio: 3
Triglycerides: 133 mg/dL (ref 0.0–149.0)
VLDL: 26.6 mg/dL (ref 0.0–40.0)

## 2022-08-24 LAB — CBC
HCT: 42.3 % (ref 36.0–46.0)
Hemoglobin: 14.3 g/dL (ref 12.0–15.0)
MCHC: 33.8 g/dL (ref 30.0–36.0)
MCV: 90.5 fl (ref 78.0–100.0)
Platelets: 279 10*3/uL (ref 150.0–400.0)
RBC: 4.68 Mil/uL (ref 3.87–5.11)
RDW: 13.5 % (ref 11.5–15.5)
WBC: 6.5 10*3/uL (ref 4.0–10.5)

## 2022-08-24 LAB — TSH: TSH: 0.54 u[IU]/mL (ref 0.35–5.50)

## 2022-08-24 LAB — HEMOGLOBIN A1C: Hgb A1c MFr Bld: 5.8 % (ref 4.6–6.5)

## 2022-08-24 MED ORDER — LISINOPRIL-HYDROCHLOROTHIAZIDE 10-12.5 MG PO TABS: 1.0000 | ORAL_TABLET | Freq: Every day | ORAL | 3 refills | Status: DC

## 2022-08-24 MED ORDER — SYNTHROID 125 MCG PO TABS: ORAL_TABLET | ORAL | 3 refills | Status: DC

## 2022-08-24 NOTE — Addendum Note (Signed)
Addended by: Lamar Blinks C on: 08/24/2022 07:26 PM   Modules accepted: Orders

## 2022-09-01 ENCOUNTER — Ambulatory Visit (INDEPENDENT_AMBULATORY_CARE_PROVIDER_SITE_OTHER): Payer: Medicare HMO | Admitting: Family Medicine

## 2022-09-01 ENCOUNTER — Encounter: Payer: Self-pay | Admitting: Family Medicine

## 2022-09-01 VITALS — BP 125/57 | HR 85 | Temp 98.3°F | Resp 18 | Ht 62.0 in | Wt 172.8 lb

## 2022-09-01 DIAGNOSIS — R051 Acute cough: Secondary | ICD-10-CM | POA: Diagnosis not present

## 2022-09-01 LAB — POC COVID19 BINAXNOW: SARS Coronavirus 2 Ag: NEGATIVE

## 2022-09-01 MED ORDER — AZITHROMYCIN 250 MG PO TABS: ORAL_TABLET | ORAL | 0 refills | Status: AC

## 2022-09-01 MED ORDER — METHYLPREDNISOLONE ACETATE 40 MG/ML IJ SUSP
40.0000 mg | Freq: Once | INTRAMUSCULAR | Status: AC
  Administered 2022-09-01: 40 mg via INTRAMUSCULAR

## 2022-09-01 MED ORDER — BENZONATATE 100 MG PO CAPS: 100.0000 mg | ORAL_CAPSULE | Freq: Two times a day (BID) | ORAL | 0 refills | Status: DC | PRN

## 2022-09-01 NOTE — Progress Notes (Signed)
Acute Office Visit  Subjective:     Patient ID: Felicia Lowe, female    DOB: 08-Dec-1940, 82 y.o.   MRN: [id number]  Chief Complaint  Patient presents with   Cough    Started this past weekend after she developed head congestion and sneezing on Thursday.     Patient is in today for cough, sinus congestion.   Patient reports towards the end of last week, 4 to 5 days ago she started noticing some headache, head congestion, sneezing.  A few days later cough developed.  She has started having some chest congestion with tightness following coughing spells, occasional shortness of breath, rhinorrhea, nasal congestion, sneezing.  Cough has been productive at times with yellow/brown sputum.  Reports her husband has had similar symptoms and he was just started on prednisone and a Z-Pak and is improving.  She denies any fevers, body aches, headaches, ear pain, chest pain, wheezing.     All review of systems negative except what is listed in the HPI      Objective:    BP (!) 125/57   Pulse 85   Temp 98.3 F (36.8 C) (Oral)   Resp 18   Ht '5\' 2"'$  (1.575 m)   Wt 172 lb 12.8 oz (78.4 kg)   SpO2 98%   BMI 31.61 kg/m    Physical Exam Vitals reviewed.  Constitutional:      Appearance: Normal appearance.  HENT:     Head: Normocephalic.  Eyes:     Conjunctiva/sclera: Conjunctivae normal.  Cardiovascular:     Rate and Rhythm: Normal rate and regular rhythm.  Pulmonary:     Effort: Pulmonary effort is normal.     Breath sounds: Wheezing present. No rhonchi or rales.  Musculoskeletal:     Cervical back: Normal range of motion and neck supple. No tenderness.  Lymphadenopathy:     Cervical: No cervical adenopathy.  Neurological:     General: No focal deficit present.     Mental Status: She is alert and oriented to person, place, and time. Mental status is at baseline.  Psychiatric:        Mood and Affect: Mood normal.        Behavior: Behavior normal.        Thought Content:  Thought content normal.        Judgment: Judgment normal.        Results for orders placed or performed in visit on 09/01/22  POC COVID-19 BinaxNow  Result Value Ref Range   SARS Coronavirus 2 Ag Negative Negative        Assessment & Plan:   Problem List Items Addressed This Visit   None Visit Diagnoses     Acute cough    -  Primary COVID negative Steroid injection today for your tightness and wheezing Recommend a few more days of conservative measures in case this is viral. If symptoms persist past the 1 week mark, go ahead and start the [place].  Adding Tessalon for cough - practice coughing and deep breathing regularly to keep your lungs open and clear. Start Mucinex.  Continue supportive measures including rest, hydration, humidifier use, steam showers, warm compresses to sinuses, warm liquids with lemon and honey, and over-the-counter cough, cold, and analgesics as needed.   Please contact office for follow-up if symptoms do not improve or worsen. Seek emergency care if symptoms become severe.     Relevant Medications   methylPREDNISolone acetate (DEPO-MEDROL) injection 40 mg (Completed) (Start on 09/01/2022  10:30 AM)   benzonatate (TESSALON) 100 MG capsule   azithromycin (ZITHROMAX) 250 MG tablet   Other Relevant Orders   POC COVID-19 BinaxNow (Completed)       Meds ordered this encounter  Medications   methylPREDNISolone acetate (DEPO-MEDROL) injection 40 mg   benzonatate (TESSALON) 100 MG capsule    Sig: Take 1 capsule (100 mg total) by mouth 2 (two) times daily as needed for cough.    Dispense:  20 capsule    Refill:  0    Order Specific Question:   Supervising Provider    Answer:   Penni Homans A [[id number]]   azithromycin (ZITHROMAX) 250 MG tablet    Sig: Take 2 tablets on day 1, then 1 tablet daily on days 2 through 5    Dispense:  6 tablet    Refill:  0    Order Specific Question:   Supervising Provider    Answer:   Penni Homans A [[id number]]    Return if  symptoms worsen or fail to improve.  Terrilyn Saver, NP

## 2022-09-01 NOTE — Patient Instructions (Addendum)
COVID negative Steroid injection today for your tightness and wheezing Recommend a few more days of conservative measures in case this is viral. If symptoms persist past the 1 week mark, go ahead and start the [place].  Adding Tessalon for cough - practice coughing and deep breathing regularly to keep your lungs open and clear.  Continue supportive measures including rest, hydration, humidifier use, steam showers, warm compresses to sinuses, warm liquids with lemon and honey, and over-the-counter cough, cold, and analgesics as needed.   Please contact office for follow-up if symptoms do not improve or worsen. Seek emergency care if symptoms become severe.

## 2022-09-17 ENCOUNTER — Encounter: Payer: Self-pay | Admitting: Family Medicine

## 2022-09-17 ENCOUNTER — Telehealth: Payer: Self-pay

## 2022-09-17 ENCOUNTER — Ambulatory Visit: Payer: Medicare HMO | Admitting: Family Medicine

## 2022-09-17 VITALS — BP 116/58 | HR 79 | Temp 98.5°F | Resp 18 | Ht 62.0 in | Wt 171.4 lb

## 2022-09-17 DIAGNOSIS — L308 Other specified dermatitis: Secondary | ICD-10-CM | POA: Diagnosis not present

## 2022-09-17 MED ORDER — PREDNISONE 20 MG PO TABS: ORAL_TABLET | ORAL | 0 refills | Status: AC

## 2022-09-17 NOTE — Telephone Encounter (Signed)
Pt sent mychart message regarding Rash  Was advised need appt to [facility] and Appt was made.

## 2022-09-17 NOTE — Patient Instructions (Signed)
Uncertain etiology. Continue daily antihistamine for a few more days until rash improves. Add short prednisone taper. Over-the-counter topical symptom management as needed.   Please contact office for follow-up if symptoms do not improve or worsen. Seek emergency care if symptoms become severe.

## 2022-09-17 NOTE — Progress Notes (Signed)
Acute Office Visit  Subjective:     Patient ID: Felicia Lowe, female    DOB: 1941/07/03, 82 y.o.   MRN: [id number]  Chief Complaint  Patient presents with   rash    On the back and legs. She does not know what may have caused this. It is itch and painful.     HPI Patient is in today for rash  Patient states that she has a rash to her back and legs. First noticed about a week ago when she was finishing up a Zpak for URI - reports she has tolerated this fine in the past. States that rash is barely visible, but she does feel some scattered raised bumps across her back, neck, and posterior legs (no specific pattern). She reports the rash is extremely itchy. She has been taking an antihistamine during the day and that has helped with the itching temporarily. Reports all URI symptoms have resolved and she feels at baseline other than the itching. She denies any new skin care products, detergents, foods, pets, etc.   ROS All review of systems negative except what is listed in the HPI      Objective:    BP (!) 116/58 (BP Location: Left Arm, Patient Position: Sitting, Cuff Size: Large)   Pulse 79   Temp 98.5 F (36.9 C) (Oral)   Resp 18   Ht '5\' 2"'$  (1.575 m)   Wt 171 lb 6.4 oz (77.7 kg)   SpO2 98%   BMI 31.35 kg/m    Physical Exam Vitals reviewed.  Constitutional:      Appearance: Normal appearance.  Pulmonary:     Effort: Pulmonary effort is normal.  Skin:    General: Skin is warm and dry.     Comments: Scattered/generalized erythematous papules (some scabbed from scratching) to back, posterior legs; no patches, pattern, surrounding erythema, or edema  Neurological:     Mental Status: She is alert and oriented to person, place, and time.  Psychiatric:        Mood and Affect: Mood normal.        Behavior: Behavior normal.        Thought Content: Thought content normal.        Judgment: Judgment normal.        No results found for any visits on 09/17/22.       Assessment & Plan:   Problem List Items Addressed This Visit   None Visit Diagnoses     Pruritic dermatitis    -  Primary   Relevant Medications   predniSONE (DELTASONE) 20 MG tablet      Uncertain etiology. Continue daily antihistamine for a few more days until rash improves. Add short prednisone taper. Over-the-counter topical symptom management as needed.   Please contact office for follow-up if symptoms do not improve or worsen. Seek emergency care if symptoms become severe.  Meds ordered this encounter  Medications   predniSONE (DELTASONE) 20 MG tablet    Sig: Take 2 tablets (40 mg total) by mouth daily with breakfast for 3 days, THEN 1 tablet (20 mg total) daily with breakfast for 2 days, THEN 0.5 tablets (10 mg total) daily with breakfast for 2 days.    Dispense:  9 tablet    Refill:  0    Order Specific Question:   Supervising Provider    Answer:   Penni Homans A [[id number]]    Return if symptoms worsen or fail to improve.  Terrilyn Saver, NP

## 2022-10-20 ENCOUNTER — Observation Stay (HOSPITAL_COMMUNITY): Payer: Medicare HMO

## 2022-10-20 ENCOUNTER — Emergency Department (HOSPITAL_BASED_OUTPATIENT_CLINIC_OR_DEPARTMENT_OTHER): Payer: Medicare HMO

## 2022-10-20 ENCOUNTER — Encounter (HOSPITAL_BASED_OUTPATIENT_CLINIC_OR_DEPARTMENT_OTHER): Payer: Self-pay

## 2022-10-20 ENCOUNTER — Inpatient Hospital Stay (HOSPITAL_BASED_OUTPATIENT_CLINIC_OR_DEPARTMENT_OTHER)
Admission: EM | Admit: 2022-10-20 | Discharge: 2022-10-22 | DRG: 390 | Disposition: A | Discharge status: Home / Self Care | Payer: Medicare HMO | Attending: Internal Medicine | Admitting: Internal Medicine

## 2022-10-20 ENCOUNTER — Other Ambulatory Visit: Payer: Self-pay

## 2022-10-20 DIAGNOSIS — Z801 Family history of malignant neoplasm of trachea, bronchus and lung: Secondary | ICD-10-CM

## 2022-10-20 DIAGNOSIS — E039 Hypothyroidism, unspecified: Secondary | ICD-10-CM | POA: Diagnosis not present

## 2022-10-20 DIAGNOSIS — E669 Obesity, unspecified: Secondary | ICD-10-CM | POA: Diagnosis not present

## 2022-10-20 DIAGNOSIS — M810 Age-related osteoporosis without current pathological fracture: Secondary | ICD-10-CM | POA: Diagnosis not present

## 2022-10-20 DIAGNOSIS — Z882 Allergy status to sulfonamides status: Secondary | ICD-10-CM | POA: Diagnosis not present

## 2022-10-20 DIAGNOSIS — Z7982 Long term (current) use of aspirin: Secondary | ICD-10-CM

## 2022-10-20 DIAGNOSIS — Z8249 Family history of ischemic heart disease and other diseases of the circulatory system: Secondary | ICD-10-CM

## 2022-10-20 DIAGNOSIS — Z881 Allergy status to other antibiotic agents status: Secondary | ICD-10-CM

## 2022-10-20 DIAGNOSIS — Z79890 Hormone replacement therapy (postmenopausal): Secondary | ICD-10-CM

## 2022-10-20 DIAGNOSIS — E785 Hyperlipidemia, unspecified: Secondary | ICD-10-CM | POA: Diagnosis present

## 2022-10-20 DIAGNOSIS — E876 Hypokalemia: Secondary | ICD-10-CM | POA: Diagnosis present

## 2022-10-20 DIAGNOSIS — Z6830 Body mass index (BMI) 30.0-30.9, adult: Secondary | ICD-10-CM

## 2022-10-20 DIAGNOSIS — Z8349 Family history of other endocrine, nutritional and metabolic diseases: Secondary | ICD-10-CM | POA: Diagnosis not present

## 2022-10-20 DIAGNOSIS — Z91012 Allergy to eggs: Secondary | ICD-10-CM

## 2022-10-20 DIAGNOSIS — K56609 Unspecified intestinal obstruction, unspecified as to partial versus complete obstruction: Secondary | ICD-10-CM | POA: Diagnosis present

## 2022-10-20 DIAGNOSIS — Z880 Allergy status to penicillin: Secondary | ICD-10-CM | POA: Diagnosis not present

## 2022-10-20 DIAGNOSIS — K56600 Partial intestinal obstruction, unspecified as to cause: Principal | ICD-10-CM | POA: Diagnosis present

## 2022-10-20 DIAGNOSIS — Z83438 Family history of other disorder of lipoprotein metabolism and other lipidemia: Secondary | ICD-10-CM | POA: Diagnosis not present

## 2022-10-20 DIAGNOSIS — K219 Gastro-esophageal reflux disease without esophagitis: Secondary | ICD-10-CM | POA: Diagnosis present

## 2022-10-20 DIAGNOSIS — I10 Essential (primary) hypertension: Secondary | ICD-10-CM | POA: Diagnosis not present

## 2022-10-20 DIAGNOSIS — Z803 Family history of malignant neoplasm of breast: Secondary | ICD-10-CM

## 2022-10-20 DIAGNOSIS — E079 Disorder of thyroid, unspecified: Secondary | ICD-10-CM | POA: Diagnosis present

## 2022-10-20 DIAGNOSIS — Z96643 Presence of artificial hip joint, bilateral: Secondary | ICD-10-CM | POA: Diagnosis present

## 2022-10-20 DIAGNOSIS — E559 Vitamin D deficiency, unspecified: Secondary | ICD-10-CM | POA: Diagnosis present

## 2022-10-20 DIAGNOSIS — Z90710 Acquired absence of both cervix and uterus: Secondary | ICD-10-CM | POA: Diagnosis not present

## 2022-10-20 LAB — URINALYSIS, MICROSCOPIC (REFLEX)

## 2022-10-20 LAB — URINALYSIS, ROUTINE W REFLEX MICROSCOPIC
Bilirubin Urine: NEGATIVE
Glucose, UA: NEGATIVE mg/dL
Ketones, ur: 80 mg/dL — AB
Leukocytes,Ua: NEGATIVE
Nitrite: POSITIVE — AB
Protein, ur: 30 mg/dL — AB
Specific Gravity, Urine: 1.01 (ref 1.005–1.030)
pH: 7 (ref 5.0–8.0)

## 2022-10-20 LAB — CBC
HCT: 41.8 % (ref 36.0–46.0)
Hemoglobin: 14.6 g/dL (ref 12.0–15.0)
MCH: 30.7 pg (ref 26.0–34.0)
MCHC: 34.9 g/dL (ref 30.0–36.0)
MCV: 88 fL (ref 80.0–100.0)
Platelets: 323 10*3/uL (ref 150–400)
RBC: 4.75 MIL/uL (ref 3.87–5.11)
RDW: 13.4 % (ref 11.5–15.5)
WBC: 12.3 10*3/uL — ABNORMAL HIGH (ref 4.0–10.5)
nRBC: 0 % (ref 0.0–0.2)

## 2022-10-20 LAB — COMPREHENSIVE METABOLIC PANEL
ALT: 14 U/L (ref 0–44)
AST: 19 U/L (ref 15–41)
Albumin: 4.3 g/dL (ref 3.5–5.0)
Alkaline Phosphatase: 59 U/L (ref 38–126)
Anion gap: 12 (ref 5–15)
BUN: 18 mg/dL (ref 8–23)
CO2: 21 mmol/L — ABNORMAL LOW (ref 22–32)
Calcium: 9.6 mg/dL (ref 8.9–10.3)
Chloride: 102 mmol/L (ref 98–111)
Creatinine, Ser: 0.68 mg/dL (ref 0.44–1.00)
GFR, Estimated: >60 mL/min (ref >60)
Glucose, Bld: 205 mg/dL — ABNORMAL HIGH (ref 70–99)
Potassium: 3.2 mmol/L — ABNORMAL LOW (ref 3.5–5.1)
Sodium: 135 mmol/L (ref 135–145)
Total Bilirubin: 0.8 mg/dL (ref 0.3–1.2)
Total Protein: 7.7 g/dL (ref 6.5–8.1)

## 2022-10-20 LAB — MAGNESIUM: Magnesium: 2 mg/dL (ref 1.7–2.4)

## 2022-10-20 LAB — LIPASE, BLOOD: Lipase: 28 U/L (ref 11–51)

## 2022-10-20 MED ORDER — ONDANSETRON HCL 4 MG/2ML IJ SOLN
4.0000 mg | Freq: Four times a day (QID) | INTRAMUSCULAR | Status: DC | PRN
Start: 2022-10-20 — End: 2022-10-20

## 2022-10-20 MED ORDER — HYDROCHLOROTHIAZIDE 12.5 MG PO TABS
12.5000 mg | ORAL_TABLET | Freq: Every day | ORAL | Status: DC
  Administered 2022-10-20 – 2022-10-22 (×3): 12.5 mg via ORAL
  Filled 2022-10-20 (×3): qty 1

## 2022-10-20 MED ORDER — ONDANSETRON HCL 4 MG PO TABS
4.0000 mg | ORAL_TABLET | Freq: Four times a day (QID) | ORAL | Status: DC | PRN
Start: 2022-10-20 — End: 2022-10-20

## 2022-10-20 MED ORDER — ONDANSETRON HCL 4 MG/2ML IJ SOLN
4.0000 mg | Freq: Four times a day (QID) | INTRAMUSCULAR | Status: DC | PRN
  Administered 2022-10-20: 4 mg via INTRAVENOUS
  Filled 2022-10-20: qty 2

## 2022-10-20 MED ORDER — LISINOPRIL-HYDROCHLOROTHIAZIDE 10-12.5 MG PO TABS: 1.0000 | ORAL_TABLET | Freq: Every day | ORAL | Status: DC

## 2022-10-20 MED ORDER — SODIUM CHLORIDE 0.9 % IV BOLUS
1000.0000 mL | Freq: Once | INTRAVENOUS | Status: AC
  Administered 2022-10-20: 1000 mL via INTRAVENOUS

## 2022-10-20 MED ORDER — ACETAMINOPHEN 650 MG RE SUPP: 650.0000 mg | Freq: Four times a day (QID) | RECTAL | Status: DC | PRN

## 2022-10-20 MED ORDER — MORPHINE SULFATE (PF) 4 MG/ML IV SOLN
4.0000 mg | INTRAVENOUS | Status: DC | PRN
  Administered 2022-10-20: 4 mg via INTRAVENOUS
  Filled 2022-10-20: qty 1

## 2022-10-20 MED ORDER — ALBUTEROL SULFATE (2.5 MG/3ML) 0.083% IN NEBU: 2.5000 mg | INHALATION_SOLUTION | RESPIRATORY_TRACT | Status: DC | PRN

## 2022-10-20 MED ORDER — PHENOL 1.4 % MT LIQD: 1.0000 | OROMUCOSAL | Status: DC | PRN

## 2022-10-20 MED ORDER — LISINOPRIL 10 MG PO TABS
10.0000 mg | ORAL_TABLET | Freq: Every day | ORAL | Status: DC
  Administered 2022-10-20 – 2022-10-22 (×3): 10 mg via ORAL
  Filled 2022-10-20 (×3): qty 1

## 2022-10-20 MED ORDER — LEVOTHYROXINE SODIUM 125 MCG PO TABS
125.0000 ug | ORAL_TABLET | Freq: Every day | ORAL | Status: DC
  Administered 2022-10-21 – 2022-10-22 (×2): 125 ug via ORAL
  Filled 2022-10-20 (×2): qty 1

## 2022-10-20 MED ORDER — IOHEXOL 300 MG/ML  SOLN
100.0000 mL | Freq: Once | INTRAMUSCULAR | Status: AC | PRN
  Administered 2022-10-20: 100 mL via INTRAVENOUS

## 2022-10-20 MED ORDER — ONDANSETRON HCL 4 MG/2ML IJ SOLN
4.0000 mg | Freq: Once | INTRAMUSCULAR | Status: AC
  Administered 2022-10-20: 4 mg via INTRAVENOUS
  Filled 2022-10-20: qty 2

## 2022-10-20 MED ORDER — ENOXAPARIN SODIUM 40 MG/0.4ML IJ SOSY
40.0000 mg | PREFILLED_SYRINGE | INTRAMUSCULAR | Status: DC
  Administered 2022-10-20 – 2022-10-21 (×2): 40 mg via SUBCUTANEOUS
  Filled 2022-10-20 (×2): qty 0.4

## 2022-10-20 MED ORDER — POTASSIUM CHLORIDE 10 MEQ/100ML IV SOLN
10.0000 meq | INTRAVENOUS | Status: AC
  Administered 2022-10-20 (×4): 10 meq via INTRAVENOUS
  Filled 2022-10-20 (×4): qty 100

## 2022-10-20 MED ORDER — SODIUM CHLORIDE 0.9 % IV SOLN: INTRAVENOUS | Status: DC

## 2022-10-20 MED ORDER — MORPHINE SULFATE (PF) 2 MG/ML IV SOLN
2.0000 mg | INTRAVENOUS | Status: DC | PRN
Start: 2022-10-20 — End: 2022-10-23

## 2022-10-20 MED ORDER — DIATRIZOATE MEGLUMINE & SODIUM 66-10 % PO SOLN
90.0000 mL | Freq: Once | ORAL | Status: AC
  Administered 2022-10-20: 90 mL via NASOGASTRIC
  Filled 2022-10-20: qty 90

## 2022-10-20 MED ORDER — ACETAMINOPHEN 325 MG PO TABS: 650.0000 mg | ORAL_TABLET | Freq: Four times a day (QID) | ORAL | Status: DC | PRN

## 2022-10-20 NOTE — ED Triage Notes (Signed)
C/o vomiting, diarrhea since 2030 last night. Also having epigastric pain, fatigue.

## 2022-10-20 NOTE — H&P (Signed)
History and Physical  Felicia Lowe [id number] DOB: 05/11/1941 DOA: 10/20/2022  PCP: Bradd Canary, MD   Chief Complaint: Abdominal pain  HPI: Felicia Lowe is a 82 y.o. female with medical history significant for hypertension, hypothyroidism admitted to the hospital with small bowel obstruction.  States that in retrospect, she started having some intermittent abdominal pain and noticed change in her bowel movements maybe about 10 days ago, however yesterday she had been celebrating her trying to resolve her constipation, later that evening she started having severe epigastric abdominal pain with multiple episodes of nausea and vomiting thereafter.  She did have watery stools last evening, and a harder stool this morning.  Feels like she has not passed any flatus today.  ED Course: She came to the emergency department for evaluation of this abdominal pain and vomiting, was noted to have relatively normal vital signs.  Lab work with hypokalemia potassium 3.2, WBC 12 K.  CT scan was done which showed evidence of dilated fluid-filled loops of small bowel.  She was kept n.p.o., given pain and nausea medication, and is feeling much better currently.  She has just been seen by general surgery, and they are planning on NG tube placement.  Review of Systems: Please see HPI for pertinent positives and negatives. A complete 10 system review of systems are otherwise negative.  Past Medical History:  Diagnosis Date   Allergy    Arthritis    osteoarthritis, severe, b/l knees &b/l hips  replaced   BPV (benign positional vertigo) 02/19/2013   mild   Chicken pox as a child   GERD (gastroesophageal reflux disease) 05/27/2014   H/O breast biopsy    benign, on left   Hypertension    Kidney stone 2000   nephritis as a child   Measles as a child   Mumps as a child   Obesity    Osteoporosis    Preventative health care 11/25/2014   Pruritus 01/21/2017   Scabies    Skin lesion of face 05/18/2012    Thyroid disease    Unsteady gait 11/25/2014   UTI (urinary tract infection) 12/11/2012   UTI (urinary tract infection)    Viral respiratory illness 05/21/2013   Vitamin D deficiency    Past Surgical History:  Procedure Laterality Date   CATARACT EXTRACTION Left 2017   EYE SURGERY Left 2019   cataract removal left   HIP SURGERY     both hips   KNEE SURGERY     both knees replaced   LITHOTRIPSY     For kidney stones    Social History:  reports that she has never smoked. She has never used smokeless tobacco. She reports that she does not currently use alcohol. She reports that she does not use drugs.   Allergies  Allergen Reactions   Egg-Derived Products Shortness Of Breath and Rash   Sulfa Antibiotics Nausea And Vomiting   Keflex [Cephalexin] Rash    Rash    Penicillins Rash   Poison Ivy Extract [Poison Ivy Extract] Rash    Family History  Problem Relation Age of Onset   Hyperlipidemia Mother    Hypertension Mother    Heart attack Mother    Other Mother        goiter problems   Cancer Father        lung- smoker   Heart attack Father        X 2   Thyroid disease Brother    Hypertension Son  Rashes / Skin problems Son    Hernia Son        umbilical   Cancer Paternal Grandmother    Macular degeneration Brother    Breast cancer Maternal Aunt    Colon cancer Neg Hx    Esophageal cancer Neg Hx    Rectal cancer Neg Hx    Stomach cancer Neg Hx      Prior to Admission medications   Medication Sig Start Date End Date Taking? Authorizing Provider  Ascorbic Acid (VITAMIN C) 1000 MG tablet Take 1,000 mg by mouth daily.   Yes [provider]  aspirin 81 MG tablet Take 81 mg by mouth daily.   Yes [provider]  Cholecalciferol (VITAMIN D) 2000 UNITS tablet Take 2,000 Units by mouth daily.   Yes [provider]  Coenzyme Q10 (CO Q 10) 100 MG CAPS Take 100 mg by mouth daily.   Yes [provider]  lisinopril-hydrochlorothiazide  (ZESTORETIC) 10-12.5 MG tablet Take 1 tablet by mouth daily. 08/24/22  Yes Copland, Gwenlyn Found, MD  Omega-3 Fatty Acids (FISH OIL) 1000 MG CPDR Take 1,000 mg by mouth daily.   Yes [provider]  Probiotic Product (PROBIOTIC DAILY PO) Take 1 capsule by mouth daily.   Yes [provider]  sodium chloride (MURO 128) 2 % ophthalmic solution Place 1 drop into both eyes daily.   Yes [provider]  SYNTHROID 125 MCG tablet TAKE 1 TABLET (125 MCG TOTAL) BY MOUTH DAILY. SYNTHROID Patient taking differently: Take 125 mcg by mouth daily before breakfast. 08/24/22  Yes Copland, Gwenlyn Found, MD  benzonatate (TESSALON) 100 MG capsule Take 1 capsule (100 mg total) by mouth 2 (two) times daily as needed for cough. Patient not taking: Reported on 10/20/2022 09/01/22   Clayborne Dana, NP    Physical Exam: BP (!) 130/59 (BP Location: Right Arm)   Pulse 76   Temp 98.9 F (37.2 C) (Oral)   Resp 16   Ht  (1.575 m)   Wt 76.7 kg   SpO2 99%   BMI 30.91 kg/m   General:  Alert, oriented, calm, in no acute distress, looks comfortable, daughter at the bedside. Eyes: EOMI, clear conjuctivae, white sclerea Neck: supple, no masses, trachea mildline  Cardiovascular: RRR, no murmurs or rubs, no peripheral edema  Respiratory: clear to auscultation bilaterally, no wheezes, no crackles  Abdomen: soft, minimally tender on deep palpation, nondistended, normal bowel tones heard  Skin: dry, no rashes  Musculoskeletal: no joint effusions, normal range of motion  Psychiatric: appropriate affect, normal speech  Neurologic: extraocular muscles intact, clear speech, moving all extremities with intact sensorium          Labs on Admission:  Basic Metabolic Panel: Recent Labs  Lab 10/20/22 0759  NA 135  K 3.2*  CL 102  CO2 21*  GLUCOSE 205*  BUN 18  CREATININE 0.68  CALCIUM 9.6   Liver Function Tests: Recent Labs  Lab 10/20/22 0759  AST 19  ALT 14  ALKPHOS 59  BILITOT 0.8  PROT  7.7  ALBUMIN 4.3   Recent Labs  Lab 10/20/22 0759  LIPASE 28   No results for input(s): "AMMONIA" in the last 168 hours. CBC: Recent Labs  Lab 10/20/22 0759  WBC 12.3*  HGB 14.6  HCT 41.8  MCV 88.0  PLT 323   Cardiac Enzymes: No results for input(s): "CKTOTAL", "CKMB", "CKMBINDEX", "TROPONINI" in the last 168 hours.  BNP (last 3 results) No results for  input(s): "BNP" in the last 8760 hours.  ProBNP (last 3 results) No results for input(s): "PROBNP" in the last 8760 hours.  CBG: No results for input(s): "GLUCAP" in the last 168 hours.  Radiological Exams on Admission: DG Abd Portable 1V-Small Bowel Protocol-Position Verification  Result Date: 10/20/2022 CLINICAL DATA:  [id number] Encounter for imaging study to confirm nasogastric (NG) tube placement [id number] EXAM: PORTABLE ABDOMEN - 1 VIEW COMPARISON:  CT 10/20/2022 FINDINGS: Nasogastric tube tip and side port overlie the stomach. Moderate colonic stool burden. There are few prominent loops of small bowel in the lower abdomen as seen on recent CT. Retained contrast material noted in the renal collecting systems. Degenerative changes of the spine. IMPRESSION: Nasogastric tube tip and side port overlie the stomach. Electronically Signed   By: Caprice Renshaw M.D.   On: 10/20/2022 15:15   CT Abdomen Pelvis W Contrast  Result Date: 10/20/2022 CLINICAL DATA:  Abdominal pain, acute, nonlocalized EXAM: CT ABDOMEN AND PELVIS WITH CONTRAST TECHNIQUE: Multidetector CT imaging of the abdomen and pelvis was performed using the standard protocol following bolus administration of intravenous contrast. RADIATION DOSE REDUCTION: This exam was performed according to the departmental dose-optimization program which includes automated exposure control, adjustment of the mA and/or kV according to patient size and/or use of iterative reconstruction technique. CONTRAST:  OMNIPAQUE IOHEXOL 300 MG/ML  SOLN COMPARISON:  06/29/2019 FINDINGS: Lower chest: No  acute abnormality. Hepatobiliary: No focal liver abnormality is identified. Cholelithiasis. No gallbladder wall thickening or inflammatory changes by CT. No biliary dilatation. Pancreas: Unremarkable. No pancreatic ductal dilatation or surrounding inflammatory changes. Spleen: Normal in size without focal abnormality. Adrenals/Urinary Tract: Unremarkable adrenal glands. Kidneys enhance symmetrically. No solid renal lesion. No definite stone. Similar prominence of the bilateral renal collecting systems without frank hydronephrosis. No ureteral calculi identified. No definite distal ureteral or bladder stones, evaluation is slightly degraded by metal artifact from patient's hip prostheses. Stomach/Bowel: Small hiatal hernia. Stomach otherwise within normal limits. Duodenal diverticulum again seen. Several fluid-filled loops small bowel, upper limits of normal in caliber. There is some fecalization of small bowel content. Tapered transition point to relatively decompressed distal small bowel within the central low abdomen (series 2, image 48). Extensive diverticular disease throughout the colon. No focally inflamed diverticulum is seen. Vascular/Lymphatic: Scattered aortoiliac atherosclerotic calcifications without aneurysm. No abdominopelvic lymphadenopathy. Reproductive: Uterus atrophic or surgically absent. No adnexal masses. Other: Trace free fluid within the pelvis. No organized abdominopelvic fluid collection. No pneumoperitoneum. No abdominal wall hernia. Musculoskeletal: Bilateral total hip arthroplasties. Advanced multilevel degenerative lumbar spondylosis. No acute osseous abnormality. IMPRESSION: 1. Several fluid-filled loops of small bowel, upper limits of normal in caliber with some fecalization of small bowel content. Tapered transition point to relatively decompressed distal small bowel within the central low abdomen. Findings are concerning for a low-grade or partial small bowel obstruction. 2.  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 3. Cholelithiasis without evidence of acute cholecystitis. 4. Aortic atherosclerosis (ICD10-I70.0). Electronically Signed   By: Duanne Guess D.O.   On: 10/20/2022 09:14    Assessment/Plan Principal Problem:   SBO (small bowel obstruction)-presents with nausea, vomiting, abdominal pain, bowel obstruction seen on CT.  She has prior history of hysterectomy but no other risk factors for bowel obstruction, no previous SBO.  Currently she is comfortable without significant abdominal pain or nausea. -Observation admission to MedSurg -N.p.o. -IV fluids -Pain and nausea control as needed -NG tube per surgery  Hypokalemia-likely due to GI losses from vomiting, will give  IV potassium chloride and recheck potassium level in the morning, will also check magnesium level in the meantime   Thyroid disease-continue home Synthroid   Essential hypertension-continue home lisinopril/hydrochlorothiazide   GERD (gastroesophageal reflux disease)-continue home PPI   Hyperlipidemia-continue statin   Hypothyroidism-continue home dose Synthroid  DVT prophylaxis: Lovenox   CODE STATUS: Full code, confirmed with patient at the time of admission.  Consults called: General surgery  Admission status: Observation  Time spent: 43 minutes  Kadince Boxley Sharlette Dense MD [facility] Pager [phone]  If 7PM-7AM, please contact night-coverage [email or web address] Password [facility]  10/20/2022, 4:16 PM

## 2022-10-20 NOTE — ED Notes (Signed)
Pt's O2 sats ranged from 83-90%, placed on 2L nasal canula.

## 2022-10-20 NOTE — Consult Note (Signed)
[facility] Surgery Consult Note  Felicia Lowe 02/06/1941  [id number].    Requesting MD: Alvino Blood Chief Complaint/Reason for Consult: SBO  HPI:  Felicia Lowe is a 82 y.o. female PMH HTN, hypothyroidism who presented to the ED today complaining of abdominal pain. States that she made a bean salad last night for dinner, then a little later she developed mostly epigastric abdominal pain followed by multiple episodes of nausea and nonbloody emesis. Tried taking some pepto-bismol but this did not help. She also had at least a couple episodes of diarrhea last night, and a small/formed stool this morning. She has not passed any flatus today. Denies any known recent sick contacts, but states that about 1 week ago she travelled to [place] and her diet was not as healthy as her usual. She also noticed she had been more constipated. Denies prior h/o SBO.  In the ED patient underwent CT a/p that showed several fluid-filled loops of small bowel, upper limits of normal in caliber with some fecalization of small bowel content; tapered transition point to relatively decompressed distal small bowel within the central low abdomen concerning for a low-grade or partial small bowel obstruction. Patient was transferred to [facility] and admitted to the medical service. General surgery asked to see.  Abdominal surgical history: hysterectomy Anticoagulants: none Nonsmoker Drinks wine rarely Denies illicit drug use   Family History  Problem Relation Age of Onset   Hyperlipidemia Mother    Hypertension Mother    Heart attack Mother    Other Mother        goiter problems   Cancer Father        lung- smoker   Heart attack Father        X 2   Thyroid disease Brother    Hypertension Son    Rashes / Skin problems Son    Hernia Son        umbilical   Cancer Paternal Grandmother    Macular degeneration Brother    Breast cancer Maternal Aunt    Colon cancer Neg Hx    Esophageal cancer Neg Hx    Rectal  cancer Neg Hx    Stomach cancer Neg Hx     Past Medical History:  Diagnosis Date   Allergy    Arthritis    osteoarthritis, severe, b/l knees &b/l hips  replaced   BPV (benign positional vertigo) 02/19/2013   mild   Chicken pox as a child   GERD (gastroesophageal reflux disease) 05/27/2014   H/O breast biopsy    benign, on left   Hypertension    Kidney stone 2000   nephritis as a child   Measles as a child   Mumps as a child   Obesity    Osteoporosis    Preventative health care 11/25/2014   Pruritus 01/21/2017   Scabies    Skin lesion of face 05/18/2012   Thyroid disease    Unsteady gait 11/25/2014   UTI (urinary tract infection) 12/11/2012   UTI (urinary tract infection)    Viral respiratory illness 05/21/2013   Vitamin D deficiency     Past Surgical History:  Procedure Laterality Date   CATARACT EXTRACTION Left 2017   EYE SURGERY Left 2019   cataract removal left   HIP SURGERY     both hips   KNEE SURGERY     both knees replaced   LITHOTRIPSY     For kidney stones    Social History:  reports that she has never  smoked. She has never used smokeless tobacco. She reports that she does not currently use alcohol. She reports that she does not use drugs.  Allergies:  Allergies  Allergen Reactions   Egg-Derived Products Shortness Of Breath and Rash   Sulfa Antibiotics Nausea And Vomiting   Keflex [Cephalexin] Rash    Rash    Penicillins Rash   Poison Ivy Extract [Poison Ivy Extract] Rash    (Not in a hospital admission)   Prior to Admission medications   Medication Sig Start Date End Date Taking? Authorizing Provider  Ascorbic Acid (VITAMIN C) 1000 MG tablet Take 1,000 mg by mouth daily.    [provider]  aspirin 81 MG tablet Take 81 mg by mouth daily.    [provider]  benzonatate (TESSALON) 100 MG capsule Take 1 capsule (100 mg total) by mouth 2 (two) times daily as needed for cough. 09/01/22   Clayborne Dana, NP  Cholecalciferol (VITAMIN  D) 2000 UNITS tablet Take 2,000 Units by mouth daily.    [provider]  Coenzyme Q10 (CO Q 10) 100 MG CAPS Take 400 mg by mouth daily.    [provider]  lisinopril-hydrochlorothiazide (ZESTORETIC) 10-12.5 MG tablet Take 1 tablet by mouth daily. 08/24/22   Copland, Gwenlyn Found, MD  Omega-3 Fatty Acids (FISH OIL) 1000 MG CPDR Take 1 capsule by mouth daily.    [provider]  Probiotic Product (PROBIOTIC DAILY PO) Take by mouth.    [provider]  SYNTHROID 125 MCG tablet TAKE 1 TABLET (125 MCG TOTAL) BY MOUTH DAILY. SYNTHROID 08/24/22   Copland, Gwenlyn Found, MD    Blood pressure (!) 115/52, pulse 80, temperature 98.7 F (37.1 C), temperature source Oral, resp. rate 17, height  (1.575 m), weight 76.7 kg, SpO2 97 %. Physical Exam: General: pleasant, WD/WN female who is laying in bed in NAD HEENT: head is normocephalic, atraumatic.  Sclera are noninjected.  Pupils equal and round.  Ears and nose without any masses or lesions.  Mouth is pink and moist. Dentition fair Heart: regular, rate, and rhythm Lungs: CTAB, no wheezes, rhonchi, or rales noted.  Respiratory effort nonlabored on room air Abd: soft, mild distension, mild diffuse tenderness without rebound or guarding, hypoactive bowel sounds Skin: warm and dry with no masses, lesions, or rashes Psych: A&Ox4 with an appropriate affect Neuro: MAEs, no gross motor or sensory deficits BUE/BLE  Results for orders placed or performed during the hospital encounter of 10/20/22 (from the past 48 hour(s))  Lipase, blood     Status: None   Collection Time: 10/20/22  7:59 AM  Result Value Ref Range   Lipase 28 11 - 51 U/L    Comment: Performed at [facility], [address] [place], [place] [address]  Comprehensive metabolic panel     Status: Abnormal   Collection Time: 10/20/22  7:59 AM  Result Value Ref Range   Sodium 135 135 - 145 mmol/L   Potassium 3.2 (L) 3.5 - 5.1 mmol/L   Chloride 102 98 -  111 mmol/L   CO2 21 (L) 22 - 32 mmol/L   Glucose, Bld 205 (H) 70 - 99 mg/dL    Comment: Glucose reference range applies only to samples taken after fasting for at least 8 hours.   BUN 18 8 - 23 mg/dL   Creatinine, Ser 6.04 0.44 - 1.00 mg/dL   Calcium 9.6 8.9 - 54.0 mg/dL   Total Protein 7.7 6.5 - 8.1 g/dL  Albumin 4.3 3.5 - 5.0 g/dL   AST 19 15 - 41 U/L   ALT 14 0 - 44 U/L   Alkaline Phosphatase 59 38 - 126 U/L   Total Bilirubin 0.8 0.3 - 1.2 mg/dL   GFR, Estimated >09 >81 mL/min    Comment: (NOTE) Calculated using the CKD-EPI Creatinine Equation (2021)    Anion gap 12 5 - 15    Comment: Performed at [facility], [address] [place], [place] [address]  CBC     Status: Abnormal   Collection Time: 10/20/22  7:59 AM  Result Value Ref Range   WBC 12.3 (H) 4.0 - 10.5 K/uL   RBC 4.75 3.87 - 5.11 MIL/uL   Hemoglobin 14.6 12.0 - 15.0 g/dL   HCT 82.9 56.2 - 13.0 %   MCV 88.0 80.0 - 100.0 fL   MCH 30.7 26.0 - 34.0 pg   MCHC 34.9 30.0 - 36.0 g/dL   RDW 86.5 78.4 - 69.6 %   Platelets 323 150 - 400 K/uL   nRBC 0.0 0.0 - 0.2 %    Comment: Performed at [facility], [address] [place], [place] [address]  Urinalysis, Routine w reflex microscopic -Urine, Clean Catch     Status: Abnormal   Collection Time: 10/20/22 10:20 AM  Result Value Ref Range   Color, Urine YELLOW YELLOW   APPearance CLEAR CLEAR   Specific Gravity, Urine 1.010 1.005 - 1.030   pH 7.0 5.0 - 8.0   Glucose, UA NEGATIVE NEGATIVE mg/dL   Hgb urine dipstick TRACE (A) NEGATIVE   Bilirubin Urine NEGATIVE NEGATIVE   Ketones, ur 80 (A) NEGATIVE mg/dL   Protein, ur 30 (A) NEGATIVE mg/dL   Nitrite POSITIVE (A) NEGATIVE   Leukocytes,Ua NEGATIVE NEGATIVE    Comment: Performed at [facility], [address] [place], [place] [address]  Urinalysis, Microscopic (reflex)     Status: Abnormal   Collection Time: 10/20/22 10:20 AM  Result Value Ref Range   RBC / HPF 0-5 0 - 5 RBC/hpf    WBC, UA 11-20 0 - 5 WBC/hpf   Bacteria, UA FEW (A) NONE SEEN   Squamous Epithelial / HPF 0-5 0 - 5 /HPF    Comment: Performed at [facility], [address] [place], [place] [address]   CT Abdomen Pelvis W Contrast  Result Date: 10/20/2022 CLINICAL DATA:  Abdominal pain, acute, nonlocalized EXAM: CT ABDOMEN AND PELVIS WITH CONTRAST TECHNIQUE: Multidetector CT imaging of the abdomen and pelvis was performed using the standard protocol following bolus administration of intravenous contrast. RADIATION DOSE REDUCTION: This exam was performed according to the departmental dose-optimization program which includes automated exposure control, adjustment of the mA and/or kV according to patient size and/or use of iterative reconstruction technique. CONTRAST:  OMNIPAQUE IOHEXOL 300 MG/ML  SOLN COMPARISON:  06/29/2019 FINDINGS: Lower chest: No acute abnormality. Hepatobiliary: No focal liver abnormality is identified. Cholelithiasis. No gallbladder wall thickening or inflammatory changes by CT. No biliary dilatation. Pancreas: Unremarkable. No pancreatic ductal dilatation or surrounding inflammatory changes. Spleen: Normal in size without focal abnormality. Adrenals/Urinary Tract: Unremarkable adrenal glands. Kidneys enhance symmetrically. No solid renal lesion. No definite stone. Similar prominence of the bilateral renal collecting systems without frank hydronephrosis. No ureteral calculi identified. No definite distal ureteral or bladder stones, evaluation is slightly degraded by metal artifact from patient's hip prostheses. Stomach/Bowel: Small hiatal hernia. Stomach otherwise within normal limits. Duodenal diverticulum again  seen. Several fluid-filled loops small bowel, upper limits of normal in caliber. There is some fecalization of small bowel content. Tapered transition point to relatively decompressed distal small bowel within the central low abdomen (series 2, image 48). Extensive  diverticular disease throughout the colon. No focally inflamed diverticulum is seen. Vascular/Lymphatic: Scattered aortoiliac atherosclerotic calcifications without aneurysm. No abdominopelvic lymphadenopathy. Reproductive: Uterus atrophic or surgically absent. No adnexal masses. Other: Trace free fluid within the pelvis. No organized abdominopelvic fluid collection. No pneumoperitoneum. No abdominal wall hernia. Musculoskeletal: Bilateral total hip arthroplasties. Advanced multilevel degenerative lumbar spondylosis. No acute osseous abnormality. IMPRESSION: 1. Several fluid-filled loops of small bowel, upper limits of normal in caliber with some fecalization of small bowel content. Tapered transition point to relatively decompressed distal small bowel within the central low abdomen. Findings are concerning for a low-grade or partial small bowel obstruction. 2. 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 3. Cholelithiasis without evidence of acute cholecystitis. 4. Aortic atherosclerosis (ICD10-I70.0). Electronically Signed   By: Nicholas  Plundo D.O.   On: 04/16/2Duanne Guess  Assessment/Plan Partial SBO - PSH: hysterectomy - CT a/p shows several fluid-filled loops of small bowel, upper limits of normal in caliber with some fecalization of small bowel content; tapered transition point to relatively decompressed distal small bowel within the central low abdomen concerning for a low-grade or partial small bowel obstruction - No indication for acute surgical intervention. Recommend NG tube for decompression followed by SBO protocol with gastrograffin and delayed film.  We will follow.  ID - none VTE - SCDs, ok for chemical dvt ppx from surgical standpoint FEN - IVF, NPO/NGT to LIWS Foley - none  HTN Hypothyroidism  I reviewed ED provider notes, last 24 h vitals and pain scores, last 48 h intake and output, last 24 h labs and trends, and last 24 h imaging  results.   Franne Forts, PA-C [facility] Surgery 10/20/2022, 1:01 PM Please see Amion for pager number during day hours 7:00am-4:30pm

## 2022-10-20 NOTE — Plan of Care (Signed)

## 2022-10-20 NOTE — ED Provider Notes (Signed)
[facility] Provider Note  CSN: [id number] Arrival date & time: 10/20/22 0741  Chief Complaint(s) Vomiting  HPI Felicia Lowe is a 82 y.o. female with history of hypertension present to the emergency department with abdominal pain.  Reports abdominal pain beginning around 8 PM last night.  She reports also significant uncontrolled vomiting, no hematemesis.  She did have some diarrhea but since around 2 AM has not had any bowel movements or passed gas.  No bloody stools or melena.  No chest pain, shortness of breath.  No fevers or chills.  No known sick contacts.  Symptoms were severe.  Symptoms have improved after Zofran.   Past Medical History Past Medical History:  Diagnosis Date   Allergy    Arthritis    osteoarthritis, severe, b/l knees &b/l hips  replaced   BPV (benign positional vertigo) 02/19/2013   mild   Chicken pox as a child   GERD (gastroesophageal reflux disease) 05/27/2014   H/O breast biopsy    benign, on left   Hypertension    Kidney stone 2000   nephritis as a child   Measles as a child   Mumps as a child   Obesity    Osteoporosis    Preventative health care 11/25/2014   Pruritus 01/21/2017   Scabies    Skin lesion of face 05/18/2012   Thyroid disease    Unsteady gait 11/25/2014   UTI (urinary tract infection) 12/11/2012   UTI (urinary tract infection)    Viral respiratory illness 05/21/2013   Vitamin D deficiency    Patient Active Problem List   Diagnosis Date Noted   Low back pain 04/04/2019   Hyperlipidemia 05/30/2018   Osteopenia 05/30/2018   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   Hyperglycemia 05/25/2017   Atypical chest pain 04/04/2017   Pedal edema 04/04/2017   Preventative health care 11/25/2014   Unsteady gait 11/25/2014   Dizziness 09/10/2014   GERD (gastroesophageal reflux disease) 05/27/2014   Dermatitis 01/21/2014   Allergic drug rash 03/17/2013   Intertrigo 03/17/2013   BPV (benign  positional vertigo) 02/19/2013   Urinary tract infection without hematuria 12/11/2012   Medicare annual wellness visit, subsequent 02/08/2012   Thyroid disease    Essential hypertension    Vitamin D deficiency    Mumps    Kidney stone    Obesity    Allergic state    Arthritis    H/O breast biopsy    Home Medication(s) Prior to Admission medications   Medication Sig Start Date End Date Taking? Authorizing Provider  Ascorbic Acid (VITAMIN C) 1000 MG tablet Take 1,000 mg by mouth daily.    [provider]  aspirin 81 MG tablet Take 81 mg by mouth daily.    [provider]  benzonatate (TESSALON) 100 MG capsule Take 1 capsule (100 mg total) by mouth 2 (two) times daily as needed for cough. 09/01/22   Clayborne Dana, NP  Cholecalciferol (VITAMIN D) 2000 UNITS tablet Take 2,000 Units by mouth daily.    [provider]  Coenzyme Q10 (CO Q 10) 100 MG CAPS Take 400 mg by mouth daily.    [provider]  lisinopril-hydrochlorothiazide (ZESTORETIC) 10-12.5 MG tablet Take 1 tablet by mouth daily. 08/24/22   Copland, Gwenlyn Found, MD  Omega-3 Fatty Acids (FISH OIL) 1000 MG CPDR Take 1 capsule by mouth daily.    [provider]  Probiotic Product (PROBIOTIC DAILY PO) Take by mouth.    [provider]  SYNTHROID 125 MCG tablet TAKE 1 TABLET (125 MCG TOTAL) BY MOUTH DAILY. SYNTHROID 08/24/22   Copland, Gwenlyn Found, MD                                                                                                                                    Past Surgical History Past Surgical History:  Procedure Laterality Date   CATARACT EXTRACTION Left 2017   EYE SURGERY Left 2019   cataract removal left   HIP SURGERY     both hips   KNEE SURGERY     both knees replaced   LITHOTRIPSY     For kidney stones   Family History Family History  Problem Relation Age of Onset   Hyperlipidemia Mother    Hypertension Mother    Heart attack Mother    Other Mother         goiter problems   Cancer Father        lung- smoker   Heart attack Father        X 2   Thyroid disease Brother    Hypertension Son    Rashes / Skin problems Son    Hernia Son        umbilical   Cancer Paternal Grandmother    Macular degeneration Brother    Breast cancer Maternal Aunt    Colon cancer Neg Hx    Esophageal cancer Neg Hx    Rectal cancer Neg Hx    Stomach cancer Neg Hx     Social History Social History   Tobacco Use   Smoking status: Never   Smokeless tobacco: Never  Vaping Use   Vaping Use: Never used  Substance Use Topics   Alcohol use: Not Currently    Comment: Rarely glass of wine   Drug use: No   Allergies Egg-derived products, Sulfa antibiotics, Keflex [cephalexin], Penicillins, and Poison ivy extract [poison ivy extract]  Review of Systems Review of Systems  All other systems reviewed and are negative.   Physical Exam Vital Signs  I have reviewed the triage vital signs BP (!) 119/56   Pulse 80   Temp 98.4 F (36.9 C)   Resp 11   Ht  (1.575 m)   Wt 76.7 kg   SpO2 94%   BMI 30.91 kg/m  Physical Exam Vitals and nursing note reviewed.  Constitutional:      General: She is not in acute distress.    Appearance: She is well-developed.  HENT:     Head: Normocephalic and atraumatic.     Mouth/Throat:     Mouth: Mucous membranes are dry.  Eyes:     Pupils: Pupils are equal, round, and reactive to light.  Cardiovascular:     Rate and Rhythm: Normal rate and regular rhythm.     Heart sounds: No murmur heard. Pulmonary:     Effort: Pulmonary effort is normal. No  respiratory distress.     Breath sounds: Normal breath sounds.  Abdominal:     General: Abdomen is flat.     Palpations: Abdomen is soft.     Tenderness: There is abdominal tenderness (epigastric).  Musculoskeletal:        General: No tenderness.     Right lower leg: No edema.     Left lower leg: No edema.  Skin:    General: Skin is warm and dry.   Neurological:     General: No focal deficit present.     Mental Status: She is alert. Mental status is at baseline.  Psychiatric:        Mood and Affect: Mood normal.        Behavior: Behavior normal.     ED Results and Treatments Labs (all labs ordered are listed, but only abnormal results are displayed) Labs Reviewed  COMPREHENSIVE METABOLIC PANEL - Abnormal; Notable for the following components:      Result Value   Potassium 3.2 (*)    CO2 21 (*)    Glucose, Bld 205 (*)    All other components within normal limits  CBC - Abnormal; Notable for the following components:   WBC 12.3 (*)    All other components within normal limits  URINALYSIS, ROUTINE W REFLEX MICROSCOPIC - Abnormal; Notable for the following components:   Hgb urine dipstick TRACE (*)    Ketones, ur 80 (*)    Protein, ur 30 (*)    Nitrite POSITIVE (*)    All other components within normal limits  URINALYSIS, MICROSCOPIC (REFLEX) - Abnormal; Notable for the following components:   Bacteria, UA FEW (*)    All other components within normal limits  LIPASE, BLOOD                                                                                                                          Radiology CT Abdomen Pelvis W Contrast  Result Date: 10/20/2022 CLINICAL DATA:  Abdominal pain, acute, nonlocalized EXAM: CT ABDOMEN AND PELVIS WITH CONTRAST TECHNIQUE: Multidetector CT imaging of the abdomen and pelvis was performed using the standard protocol following bolus administration of intravenous contrast. RADIATION DOSE REDUCTION: This exam was performed according to the departmental dose-optimization program which includes automated exposure control, adjustment of the mA and/or kV according to patient size and/or use of iterative reconstruction technique. CONTRAST:  OMNIPAQUE IOHEXOL 300 MG/ML  SOLN COMPARISON:  06/29/2019 FINDINGS: Lower chest: No acute abnormality. Hepatobiliary: No focal liver abnormality is  identified. Cholelithiasis. No gallbladder wall thickening or inflammatory changes by CT. No biliary dilatation. Pancreas: Unremarkable. No pancreatic ductal dilatation or surrounding inflammatory changes. Spleen: Normal in size without focal abnormality. Adrenals/Urinary Tract: Unremarkable adrenal glands. Kidneys enhance symmetrically. No solid renal lesion. No definite stone. Similar prominence of the bilateral renal collecting systems without frank hydronephrosis. No ureteral calculi identified. No definite distal ureteral or bladder stones, evaluation is slightly degraded by metal artifact from patient's  hip prostheses. Stomach/Bowel: Small hiatal hernia. Stomach otherwise within normal limits. Duodenal diverticulum again seen. Several fluid-filled loops small bowel, upper limits of normal in caliber. There is some fecalization of small bowel content. Tapered transition point to relatively decompressed distal small bowel within the central low abdomen (series 2, image 48). Extensive diverticular disease throughout the colon. No focally inflamed diverticulum is seen. Vascular/Lymphatic: Scattered aortoiliac atherosclerotic calcifications without aneurysm. No abdominopelvic lymphadenopathy. Reproductive: Uterus atrophic or surgically absent. No adnexal masses. Other: Trace free fluid within the pelvis. No organized abdominopelvic fluid collection. No pneumoperitoneum. No abdominal wall hernia. Musculoskeletal: Bilateral total hip arthroplasties. Advanced multilevel degenerative lumbar spondylosis. No acute osseous abnormality. IMPRESSION: 1. Several fluid-filled loops of small bowel, upper limits of normal in caliber with some fecalization of small bowel content. Tapered transition point to relatively decompressed distal small bowel within the central low abdomen. Findings are concerning for a low-grade or partial small bowel obstruction. 2. 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  3. Cholelithiasis without evidence of acute cholecystitis. 4. Aortic atherosclerosis (ICD10-I70.0). Electronically Signed   By: Duanne Guess D.O.   On: 10/20/2022 09:14    Pertinent labs & imaging results that were available during my care of the patient were reviewed by me and considered in my medical decision making (see MDM for details).  Medications Ordered in ED Medications  ondansetron (ZOFRAN) injection 4 mg (4 mg Intravenous Given 10/20/22 0755)  sodium chloride 0.9 % bolus 1,000 mL (0 mLs Intravenous Stopped 10/20/22 1008)  iohexol (OMNIPAQUE) 300 MG/ML solution 100 mL (100 mLs Intravenous Contrast Given 10/20/22 0847)                                                                                                                                     Procedures Procedures  (including critical care time)  Medical Decision Making / ED Course   MDM:  82 year old female presenting to the emergency department with abdominal pain.  Patient well-appearing, physical exam notable for epigastric abdominal pain.  Differential includes bowel obstruction, perforation, volvulus, cholecystitis, appendicitis, gastroenteritis.  Given tenderness and age, CT scan was obtained which does demonstrate partial or low-grade bowel obstruction.  Patient denies any history of abdominal surgeries.  Unclear cause.  She is not requiring pain medication currently and not having any ongoing nausea or vomiting.  Given age, patient would benefit from hospitalization for observation.  Doubt she would need a surgical intervention but will talk to surgery.  Will keep n.p.o. for now.  Clinical Course as of 10/20/22 1111  Tue Oct 20, 2022  1109 CT scan shows partial small bowel obstruction or low-grade bowel obstruction.  Patient feels better after Zofran, no more nausea or vomiting.  Will make NPO.  Discussed with the hospitalist, they will place bed request.  General surgery will be available to consult.  Patient  will be admitted. [WS]    Clinical Course User Index [  WS] Lonell Grandchild, MD     Additional history obtained: -Additional history obtained from family -External records from outside source obtained and reviewed including: Chart review including previous notes, labs, imaging, consultation notes including prior family med notes   Lab Tests: -I ordered, reviewed, and interpreted labs.   The pertinent results include:   Labs Reviewed  COMPREHENSIVE METABOLIC PANEL - Abnormal; Notable for the following components:      Result Value   Potassium 3.2 (*)    CO2 21 (*)    Glucose, Bld 205 (*)    All other components within normal limits  CBC - Abnormal; Notable for the following components:   WBC 12.3 (*)    All other components within normal limits  URINALYSIS, ROUTINE W REFLEX MICROSCOPIC - Abnormal; Notable for the following components:   Hgb urine dipstick TRACE (*)    Ketones, ur 80 (*)    Protein, ur 30 (*)    Nitrite POSITIVE (*)    All other components within normal limits  URINALYSIS, MICROSCOPIC (REFLEX) - Abnormal; Notable for the following components:   Bacteria, UA FEW (*)    All other components within normal limits  LIPASE, BLOOD    Notable for mild hyperkalemia. Bacturia without dysuria  EKG   EKG Interpretation  Date/Time:  Tuesday October 20 2022 07:54:31 EDT Ventricular Rate:  86 PR Interval:  170 QRS Duration: 96 QT Interval:  390 QTC Calculation: 467 R Axis:   60 Text Interpretation: Sinus rhythm Low voltage, precordial leads Confirmed by Alvino Blood ([id number]) on 10/20/2022 8:40:06 AM         Imaging Studies ordered: I ordered imaging studies including CT Abd pelvis On my interpretation imaging demonstrates SBO I independently visualized and interpreted imaging. I agree with the radiologist interpretation   Medicines ordered and prescription drug management: Meds ordered this encounter  Medications   ondansetron (ZOFRAN) injection 4  mg   sodium chloride 0.9 % bolus 1,000 mL   iohexol (OMNIPAQUE) 300 MG/ML solution 100 mL    -I have reviewed the patients home medicines and have made adjustments as needed   Consultations Obtained: I requested consultation with the general surgeon,  and discussed lab and imaging findings as well as pertinent plan - they recommend: they will consult   Cardiac Monitoring: The patient was maintained on a cardiac monitor.  I personally viewed and interpreted the cardiac monitored which showed an underlying rhythm of: NSR  Social Determinants of Health:  Diagnosis or treatment significantly limited by social determinants of health: obesity   Reevaluation: After the interventions noted above, I reevaluated the patient and found that their symptoms have improved  Co morbidities that complicate the patient evaluation  Past Medical History:  Diagnosis Date   Allergy    Arthritis    osteoarthritis, severe, b/l knees &b/l hips  replaced   BPV (benign positional vertigo) 02/19/2013   mild   Chicken pox as a child   GERD (gastroesophageal reflux disease) 05/27/2014   H/O breast biopsy    benign, on left   Hypertension    Kidney stone 2000   nephritis as a child   Measles as a child   Mumps as a child   Obesity    Osteoporosis    Preventative health care 11/25/2014   Pruritus 01/21/2017   Scabies    Skin lesion of face 05/18/2012   Thyroid disease    Unsteady gait 11/25/2014   UTI (urinary tract infection) 12/11/2012  UTI (urinary tract infection)    Viral respiratory illness 05/21/2013   Vitamin D deficiency       Dispostion: Disposition decision including need for hospitalization was considered, and patient admitted to the hospital.    Final Clinical Impression(s) / ED Diagnoses Final diagnoses:  Small bowel obstruction     This chart was dictated using voice recognition software.  Despite best efforts to proofread,  errors can occur which can change the  documentation meaning.    Lonell Grandchild, MD 10/20/22 [phone]

## 2022-10-20 NOTE — ED Notes (Signed)
Called [facility] for transport to [facility] :40.  Spoke with [facility]

## 2022-10-21 DIAGNOSIS — E039 Hypothyroidism, unspecified: Secondary | ICD-10-CM | POA: Diagnosis present

## 2022-10-21 DIAGNOSIS — E876 Hypokalemia: Secondary | ICD-10-CM | POA: Diagnosis present

## 2022-10-21 DIAGNOSIS — E559 Vitamin D deficiency, unspecified: Secondary | ICD-10-CM | POA: Diagnosis present

## 2022-10-21 DIAGNOSIS — Z8349 Family history of other endocrine, nutritional and metabolic diseases: Secondary | ICD-10-CM | POA: Diagnosis not present

## 2022-10-21 DIAGNOSIS — Z881 Allergy status to other antibiotic agents status: Secondary | ICD-10-CM | POA: Diagnosis not present

## 2022-10-21 DIAGNOSIS — E785 Hyperlipidemia, unspecified: Secondary | ICD-10-CM | POA: Diagnosis present

## 2022-10-21 DIAGNOSIS — Z801 Family history of malignant neoplasm of trachea, bronchus and lung: Secondary | ICD-10-CM | POA: Diagnosis not present

## 2022-10-21 DIAGNOSIS — Z880 Allergy status to penicillin: Secondary | ICD-10-CM | POA: Diagnosis not present

## 2022-10-21 DIAGNOSIS — Z91012 Allergy to eggs: Secondary | ICD-10-CM | POA: Diagnosis not present

## 2022-10-21 DIAGNOSIS — M810 Age-related osteoporosis without current pathological fracture: Secondary | ICD-10-CM | POA: Diagnosis present

## 2022-10-21 DIAGNOSIS — Z79890 Hormone replacement therapy (postmenopausal): Secondary | ICD-10-CM | POA: Diagnosis not present

## 2022-10-21 DIAGNOSIS — Z90710 Acquired absence of both cervix and uterus: Secondary | ICD-10-CM | POA: Diagnosis not present

## 2022-10-21 DIAGNOSIS — Z8249 Family history of ischemic heart disease and other diseases of the circulatory system: Secondary | ICD-10-CM | POA: Diagnosis not present

## 2022-10-21 DIAGNOSIS — Z7982 Long term (current) use of aspirin: Secondary | ICD-10-CM | POA: Diagnosis not present

## 2022-10-21 DIAGNOSIS — K56609 Unspecified intestinal obstruction, unspecified as to partial versus complete obstruction: Secondary | ICD-10-CM | POA: Diagnosis present

## 2022-10-21 DIAGNOSIS — K219 Gastro-esophageal reflux disease without esophagitis: Secondary | ICD-10-CM | POA: Diagnosis present

## 2022-10-21 DIAGNOSIS — E669 Obesity, unspecified: Secondary | ICD-10-CM | POA: Diagnosis present

## 2022-10-21 DIAGNOSIS — Z96643 Presence of artificial hip joint, bilateral: Secondary | ICD-10-CM | POA: Diagnosis present

## 2022-10-21 DIAGNOSIS — Z803 Family history of malignant neoplasm of breast: Secondary | ICD-10-CM | POA: Diagnosis not present

## 2022-10-21 DIAGNOSIS — I10 Essential (primary) hypertension: Secondary | ICD-10-CM | POA: Diagnosis present

## 2022-10-21 DIAGNOSIS — K56600 Partial intestinal obstruction, unspecified as to cause: Secondary | ICD-10-CM | POA: Diagnosis present

## 2022-10-21 DIAGNOSIS — Z882 Allergy status to sulfonamides status: Secondary | ICD-10-CM | POA: Diagnosis not present

## 2022-10-21 DIAGNOSIS — Z83438 Family history of other disorder of lipoprotein metabolism and other lipidemia: Secondary | ICD-10-CM | POA: Diagnosis not present

## 2022-10-21 DIAGNOSIS — Z6830 Body mass index (BMI) 30.0-30.9, adult: Secondary | ICD-10-CM | POA: Diagnosis not present

## 2022-10-21 NOTE — Progress Notes (Signed)
Subjective: CC: Abdominal pain and nausea resolved. Passing flatus. 2 soft bm's this am.   Afebrile. No tachycardia or hypotension. No labs done today.   Objective: Vital signs in last 24 hours: Temp:  [98.5 F (36.9 C)-98.9 F (37.2 C)] 98.7 F (37.1 C) (04/17 0504) Pulse Rate:  [69-101] 87 (04/17 0504) Resp:  [16] 16 (04/16 1419) BP: (105-130)/(49-70) 126/61 (04/17 0930) SpO2:  [94 %-100 %] 95 % (04/17 0504) Last BM Date : 10/20/22  Intake/Output from previous day: 04/16 0701 - 04/17 0700 In: 2473.5 [I.V.:1435.3; IV Piggyback:1038.2] Out: -  Intake/Output this shift: No intake/output data recorded.  PE: Gen:  Alert, NAD, pleasant Abd: Soft, ND, NT, +BS. NGT in place with some thick, dark output in cannister ~400cc  Lab Results:  Recent Labs    10/20/22 0759  WBC 12.3*  HGB 14.6  HCT 41.8  PLT 323   BMET Recent Labs    10/20/22 0759  NA 135  K 3.2*  CL 102  CO2 21*  GLUCOSE 205*  BUN 18  CREATININE 0.68  CALCIUM 9.6   PT/INR No results for input(s): "LABPROT", "INR" in the last 72 hours. CMP     Component Value Date/Time   NA 135 10/20/2022 0759   K 3.2 (L) 10/20/2022 0759   CL 102 10/20/2022 0759   CO2 21 (L) 10/20/2022 0759   GLUCOSE 205 (H) 10/20/2022 0759   BUN 18 10/20/2022 0759   CREATININE 0.68 10/20/2022 0759   CREATININE 0.76 11/10/2013 1147   CALCIUM 9.6 10/20/2022 0759   PROT 7.7 10/20/2022 0759   ALBUMIN 4.3 10/20/2022 0759   AST 19 10/20/2022 0759   ALT 14 10/20/2022 0759   ALKPHOS 59 10/20/2022 0759   BILITOT 0.8 10/20/2022 0759   GFRNONAA >60 10/20/2022 0759   GFRAA >60 06/29/2019 1126   Lipase     Component Value Date/Time   LIPASE 28 10/20/2022 0759    Studies/Results: DG Abd Portable 1V-Small Bowel Obstruction Protocol-initial, 8 hr delay  Result Date: 10/20/2022 CLINICAL DATA:  8 hour delay SBO EXAM: PORTABLE ABDOMEN - 1 VIEW COMPARISON:  Radiograph 10/20/2022 at 2:49 p.m. FINDINGS: The bowel gas pattern  is normal. Enteric contrast is seen diffusely throughout the colon. No radio-opaque calculi or other significant radiographic abnormality are seen. Enteric tube tip and side-port in the stomach. Bilateral THA. IMPRESSION: Enteric contrast throughout the colon.  No evidence of obstruction. Electronically Signed   By: Minerva Fester M.D.   On: 10/20/2022 23:56   DG Abd Portable 1V-Small Bowel Protocol-Position Verification  Result Date: 10/20/2022 CLINICAL DATA:  [id number] Encounter for imaging study to confirm nasogastric (NG) tube placement [id number] EXAM: PORTABLE ABDOMEN - 1 VIEW COMPARISON:  CT 10/20/2022 FINDINGS: Nasogastric tube tip and side port overlie the stomach. Moderate colonic stool burden. There are few prominent loops of small bowel in the lower abdomen as seen on recent CT. Retained contrast material noted in the renal collecting systems. Degenerative changes of the spine. IMPRESSION: Nasogastric tube tip and side port overlie the stomach. Electronically Signed   By: Caprice Renshaw M.D.   On: 10/20/2022 15:15   CT Abdomen Pelvis W Contrast  Result Date: 10/20/2022 CLINICAL DATA:  Abdominal pain, acute, nonlocalized EXAM: CT ABDOMEN AND PELVIS WITH CONTRAST TECHNIQUE: Multidetector CT imaging of the abdomen and pelvis was performed using the standard protocol following bolus administration of intravenous contrast. RADIATION DOSE REDUCTION: This exam was performed according to the departmental dose-optimization program which  includes automated exposure control, adjustment of the mA and/or kV according to patient size and/or use of iterative reconstruction technique. CONTRAST:  OMNIPAQUE IOHEXOL 300 MG/ML  SOLN COMPARISON:  06/29/2019 FINDINGS: Lower chest: No acute abnormality. Hepatobiliary: No focal liver abnormality is identified. Cholelithiasis. No gallbladder wall thickening or inflammatory changes by CT. No biliary dilatation. Pancreas: Unremarkable. No pancreatic ductal dilatation or  surrounding inflammatory changes. Spleen: Normal in size without focal abnormality. Adrenals/Urinary Tract: Unremarkable adrenal glands. Kidneys enhance symmetrically. No solid renal lesion. No definite stone. Similar prominence of the bilateral renal collecting systems without frank hydronephrosis. No ureteral calculi identified. No definite distal ureteral or bladder stones, evaluation is slightly degraded by metal artifact from patient's hip prostheses. Stomach/Bowel: Small hiatal hernia. Stomach otherwise within normal limits. Duodenal diverticulum again seen. Several fluid-filled loops small bowel, upper limits of normal in caliber. There is some fecalization of small bowel content. Tapered transition point to relatively decompressed distal small bowel within the central low abdomen (series 2, image 48). Extensive diverticular disease throughout the colon. No focally inflamed diverticulum is seen. Vascular/Lymphatic: Scattered aortoiliac atherosclerotic calcifications without aneurysm. No abdominopelvic lymphadenopathy. Reproductive: Uterus atrophic or surgically absent. No adnexal masses. Other: Trace free fluid within the pelvis. No organized abdominopelvic fluid collection. No pneumoperitoneum. No abdominal wall hernia. Musculoskeletal: Bilateral total hip arthroplasties. Advanced multilevel degenerative lumbar spondylosis. No acute osseous abnormality. IMPRESSION: 1. Several fluid-filled loops of small bowel, upper limits of normal in caliber with some fecalization of small bowel content. Tapered transition point to relatively decompressed distal small bowel within the central low abdomen. Findings are concerning for a low-grade or partial small bowel obstruction. 2. 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 3. Cholelithiasis without evidence of acute cholecystitis. 4. Aortic atherosclerosis (ICD10-I70.0). Electronically Signed   By: Duanne Guess D.O.   On: 10/20/2022  09:14    Anti-infectives: Anti-infectives (From admission, onward)    None        Assessment/Plan Partial SBO - PSH: hysterectomy - CT a/p shows several fluid-filled loops of small bowel, upper limits of normal in caliber with some fecalization of small bowel content; tapered transition point to relatively decompressed distal small bowel within the central low abdomen concerning for a low-grade or partial small bowel obstruction - No indication for acute surgical intervention. Clinically and radiographically improved. Symptoms resolved. ND/NT on exam. Contrast in colon on xray. Will clamp NGT and give her CLD. If she tolerates this, we can d/c NGT and advance to FLD with ADAT order to Soft diet. If tolerates diet advancement, she would be okay for discharge later today from our standpoint. Discussed plan with RN. Discussed dietary recommendations at d/c with patient and family at bedside.    ID - none VTE - SCDs, Lovenox FEN - Clamp and CLD. If tolerates, can d/c NGT and place on FLD with ADAT order to soft diet.  Foley - none   HTN Hypothyroidism  I reviewed nursing notes, hospitalist notes, last 24 h vitals and pain scores, last 48 h intake and output, last 24 h labs and trends, and last 24 h imaging results.   LOS: 0 days    Jacinto Halim , [facility] 10/21/2022, 9:48 AM Please see Amion for pager number during day hours 7:00am-4:30pm

## 2022-10-21 NOTE — Progress Notes (Signed)
  Transition of Care [facility]) Screening Note   Patient Details  Name: Felicia Lowe Date of Birth: July 12, 1940   Transition of Care [facility]) CM/SW Contact:    Otelia Santee, LCSW Phone Number: 10/21/2022, 11:24 AM    Transition of Care Department [facility]) has reviewed patient and no TOC needs have been identified at this time. We will continue to monitor patient advancement through interdisciplinary progression rounds. If new patient transition needs arise, please place a TOC consult.

## 2022-10-21 NOTE — Progress Notes (Signed)
  Progress Note   Patient: Felicia Lowe XLK:[id number] DOB: [birth year] DOA: 10/20/2022     0 DOS: the patient was seen and examined on 10/21/2022   Brief hospital course: 82 y.o. female with medical history significant for hypertension, hypothyroidism admitted to the hospital with small bowel obstruction.  States that in retrospect, she started having some intermittent abdominal pain and noticed change in her bowel movements maybe about 10 days ago, however yesterday she had been celebrating her trying to resolve her constipation, later that evening she started having severe epigastric abdominal pain with multiple episodes of nausea and vomiting thereafter. Pt was admitted for concerns of SBO. General Surgery was consulted  Assessment and Plan:  SBO (small bowel obstruction)-presents with nausea, vomiting, abdominal pain, bowel obstruction seen on CT.  She has prior history of hysterectomy but no other risk factors for bowel obstruction, no previous SBO.   -remains confortable this AM with NG in place -reports of flatus overnight -Seen by general surgery. Recs to clamp NG and try on clears and advance as tolerated   Hypokalemia -likely due to GI losses from vomiting -Corrected -recheck bmet in AM    Thyroid disease -continue home Synthroid    Essential hypertension -continue home lisinopril/hydrochlorothiazide -bp stable and controlled     GERD (gastroesophageal reflux disease) -continue home PPI    Hyperlipidemia -continue statin    Hypothyroidism -continue home dose Synthroid   Subjective: Tolerating clears when seen, but only little amounts at a time. Currently denies nausea  Physical Exam: Vitals:   10/20/22 1944 10/21/22 0504 10/21/22 0930 10/21/22 1405  BP: (!) 123/55 (!) 125/55 126/61 92/63  Pulse: 69 87  78  Resp:    16  Temp: 98.5 F (36.9 C) 98.7 F (37.1 C)  98.2 F (36.8 C)  TempSrc: Oral Oral  Oral  SpO2: 97% 95%  95%  Weight:      Height:       General  exam: Awake, laying in bed, in nad Respiratory system: Normal respiratory effort, no wheezing Cardiovascular system: regular rate, s1, s2 Gastrointestinal system: Soft, nondistended, positive BS, NG in place Central nervous system: CN2-12 grossly intact, strength intact Extremities: Perfused, no clubbing Skin: Normal skin turgor, no notable skin lesions seen Psychiatry: Mood normal // no visual hallucinations   Data Reviewed:  There are no new results to review at this time.  Family Communication: Pt in room, family not at bedside  Disposition: Status is: Observation The patient will require care spanning > 2 midnights and should be moved to inpatient because: Severity of illness  Planned Discharge Destination: Home     Author: Rickey Barbara, MD 10/21/2022 5:18 PM  For on call review [email or web address].

## 2022-10-21 NOTE — Hospital Course (Signed)
82 y.o. female with medical history significant for hypertension, hypothyroidism admitted to the hospital with small bowel obstruction.  States that in retrospect, she started having some intermittent abdominal pain and noticed change in her bowel movements maybe about 10 days ago, however yesterday she had been celebrating her trying to resolve her constipation, later that evening she started having severe epigastric abdominal pain with multiple episodes of nausea and vomiting thereafter. Pt was admitted for concerns of SBO. General Surgery was consulted

## 2022-10-21 NOTE — Progress Notes (Signed)
Patient passed small amount of gas over night, in addition to small smear bowel movement.

## 2022-10-22 DIAGNOSIS — K56609 Unspecified intestinal obstruction, unspecified as to partial versus complete obstruction: Secondary | ICD-10-CM | POA: Diagnosis not present

## 2022-10-22 LAB — COMPREHENSIVE METABOLIC PANEL
ALT: 12 U/L (ref 0–44)
AST: 11 U/L — ABNORMAL LOW (ref 15–41)
Albumin: 3.1 g/dL — ABNORMAL LOW (ref 3.5–5.0)
Alkaline Phosphatase: 40 U/L (ref 38–126)
Anion gap: 8 (ref 5–15)
BUN: 7 mg/dL — ABNORMAL LOW (ref 8–23)
CO2: 25 mmol/L (ref 22–32)
Calcium: 8.2 mg/dL — ABNORMAL LOW (ref 8.9–10.3)
Chloride: 106 mmol/L (ref 98–111)
Creatinine, Ser: 0.54 mg/dL (ref 0.44–1.00)
GFR, Estimated: >60 mL/min (ref >60)
Glucose, Bld: 94 mg/dL (ref 70–99)
Potassium: 3.3 mmol/L — ABNORMAL LOW (ref 3.5–5.1)
Sodium: 139 mmol/L (ref 135–145)
Total Bilirubin: 0.8 mg/dL (ref 0.3–1.2)
Total Protein: 5.7 g/dL — ABNORMAL LOW (ref 6.5–8.1)

## 2022-10-22 LAB — MAGNESIUM: Magnesium: 2.1 mg/dL (ref 1.7–2.4)

## 2022-10-22 NOTE — Progress Notes (Signed)
Subjective: CC: NGT clamped overnight. Tolerating cld without abdominal pain, n/v. Passing flatus. Multiple soft bm's yesterday.   Afebrile. No tachycardia or hypotension.   Objective: Vital signs in last 24 hours: Temp:  [97.7 F (36.5 C)-98.2 F (36.8 C)] 97.7 F (36.5 C) (04/18 0500) Pulse Rate:  [70-78] 72 (04/18 0500) Resp:  [16-18] 18 (04/18 0500) BP: (92-147)/(61-74) 147/74 (04/18 0500) SpO2:  [95 %] 95 % (04/17 1943) Last BM Date : 10/21/22  Intake/Output from previous day: 04/17 0701 - 04/18 0700 In: 846.9 [P.O.:300; I.V.:546.9] Out: -  Intake/Output this shift: No intake/output data recorded.  PE: Gen:  Alert, NAD, pleasant Abd: Soft, ND, NT, +BS. NGT clamped.   Lab Results:  Recent Labs    10/20/22 0759  WBC 12.3*  HGB 14.6  HCT 41.8  PLT 323    BMET Recent Labs    10/20/22 0759 10/22/22 0352  NA 135 139  K 3.2* 3.3*  CL 102 106  CO2 21* 25  GLUCOSE 205* 94  BUN 18 7*  CREATININE 0.68 0.54  CALCIUM 9.6 8.2*    PT/INR No results for input(s): "LABPROT", "INR" in the last 72 hours. CMP     Component Value Date/Time   NA 139 10/22/2022 0352   K 3.3 (L) 10/22/2022 0352   CL 106 10/22/2022 0352   CO2 25 10/22/2022 0352   GLUCOSE 94 10/22/2022 0352   BUN 7 (L) 10/22/2022 0352   CREATININE 0.54 10/22/2022 0352   CREATININE 0.76 11/10/2013 1147   CALCIUM 8.2 (L) 10/22/2022 0352   PROT 5.7 (L) 10/22/2022 0352   ALBUMIN 3.1 (L) 10/22/2022 0352   AST 11 (L) 10/22/2022 0352   ALT 12 10/22/2022 0352   ALKPHOS 40 10/22/2022 0352   BILITOT 0.8 10/22/2022 0352   GFRNONAA >60 10/22/2022 0352   GFRAA >60 06/29/2019 1126   Lipase     Component Value Date/Time   LIPASE 28 10/20/2022 0759    Studies/Results: DG Abd Portable 1V-Small Bowel Obstruction Protocol-initial, 8 hr delay  Result Date: 10/20/2022 CLINICAL DATA:  8 hour delay SBO EXAM: PORTABLE ABDOMEN - 1 VIEW COMPARISON:  Radiograph 10/20/2022 at 2:49 p.m. FINDINGS: The  bowel gas pattern is normal. Enteric contrast is seen diffusely throughout the colon. No radio-opaque calculi or other significant radiographic abnormality are seen. Enteric tube tip and side-port in the stomach. Bilateral THA. IMPRESSION: Enteric contrast throughout the colon.  No evidence of obstruction. Electronically Signed   By: Minerva Fester M.D.   On: 10/20/2022 23:56   DG Abd Portable 1V-Small Bowel Protocol-Position Verification  Result Date: 10/20/2022 CLINICAL DATA:  [id number] Encounter for imaging study to confirm nasogastric (NG) tube placement [id number] EXAM: PORTABLE ABDOMEN - 1 VIEW COMPARISON:  CT 10/20/2022 FINDINGS: Nasogastric tube tip and side port overlie the stomach. Moderate colonic stool burden. There are few prominent loops of small bowel in the lower abdomen as seen on recent CT. Retained contrast material noted in the renal collecting systems. Degenerative changes of the spine. IMPRESSION: Nasogastric tube tip and side port overlie the stomach. Electronically Signed   By: Caprice Renshaw M.D.   On: 10/20/2022 15:15   CT Abdomen Pelvis W Contrast  Result Date: 10/20/2022 CLINICAL DATA:  Abdominal pain, acute, nonlocalized EXAM: CT ABDOMEN AND PELVIS WITH CONTRAST TECHNIQUE: Multidetector CT imaging of the abdomen and pelvis was performed using the standard protocol following bolus administration of intravenous contrast. RADIATION DOSE REDUCTION: This exam was performed according to the  departmental dose-optimization program which includes automated exposure control, adjustment of the mA and/or kV according to patient size and/or use of iterative reconstruction technique. CONTRAST:  OMNIPAQUE IOHEXOL 300 MG/ML  SOLN COMPARISON:  06/29/2019 FINDINGS: Lower chest: No acute abnormality. Hepatobiliary: No focal liver abnormality is identified. Cholelithiasis. No gallbladder wall thickening or inflammatory changes by CT. No biliary dilatation. Pancreas: Unremarkable. No pancreatic ductal  dilatation or surrounding inflammatory changes. Spleen: Normal in size without focal abnormality. Adrenals/Urinary Tract: Unremarkable adrenal glands. Kidneys enhance symmetrically. No solid renal lesion. No definite stone. Similar prominence of the bilateral renal collecting systems without frank hydronephrosis. No ureteral calculi identified. No definite distal ureteral or bladder stones, evaluation is slightly degraded by metal artifact from patient's hip prostheses. Stomach/Bowel: Small hiatal hernia. Stomach otherwise within normal limits. Duodenal diverticulum again seen. Several fluid-filled loops small bowel, upper limits of normal in caliber. There is some fecalization of small bowel content. Tapered transition point to relatively decompressed distal small bowel within the central low abdomen (series 2, image 48). Extensive diverticular disease throughout the colon. No focally inflamed diverticulum is seen. Vascular/Lymphatic: Scattered aortoiliac atherosclerotic calcifications without aneurysm. No abdominopelvic lymphadenopathy. Reproductive: Uterus atrophic or surgically absent. No adnexal masses. Other: Trace free fluid within the pelvis. No organized abdominopelvic fluid collection. No pneumoperitoneum. No abdominal wall hernia. Musculoskeletal: Bilateral total hip arthroplasties. Advanced multilevel degenerative lumbar spondylosis. No acute osseous abnormality. IMPRESSION: 1. Several fluid-filled loops of small bowel, upper limits of normal in caliber with some fecalization of small bowel content. Tapered transition point to relatively decompressed distal small bowel within the central low abdomen. Findings are concerning for a low-grade or partial small bowel obstruction. 2. 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 3. Cholelithiasis without evidence of acute cholecystitis. 4. Aortic atherosclerosis (ICD10-I70.0). Electronically Signed   By: Duanne Guess D.O.   On:  10/20/2022 09:14    Anti-infectives: Anti-infectives (From admission, onward)    None        Assessment/Plan Partial SBO - PSH: hysterectomy - CT a/p shows several fluid-filled loops of small bowel, upper limits of normal in caliber with some fecalization of small bowel content; tapered transition point to relatively decompressed distal small bowel within the central low abdomen concerning for a low-grade or partial small bowel obstruction - No indication for acute surgical intervention. Clinically and radiographically improved. Symptoms resolved. ND/NT on exam. Contrast in colon on xray. Tolerating NGT clamped and CLD. D/c NGT. Marland Kitchen Advance to FLD with ADAT order to Soft diet. If tolerates diet advancement, she would be okay for discharge later today from our standpoint. Discussed dietary recommendations at d/c with patient.    ID - none VTE - SCDs, Lovenox FEN - FLD with ADAT order to soft diet.  Foley - none   HTN Hypothyroidism  I reviewed nursing notes, hospitalist notes, last 24 h vitals and pain scores, last 48 h intake and output, last 24 h labs and trends, and last 24 h imaging results.   LOS: 1 day    Jacinto Halim , [facility] 10/22/2022, 8:51 AM Please see Amion for pager number during day hours 7:00am-4:30pm

## 2022-10-22 NOTE — Plan of Care (Signed)

## 2022-10-22 NOTE — Discharge Summary (Signed)
Physician Discharge Summary   Patient: Felicia Lowe MRN: [id number] DOB: Sep 20, 1940  Admit date:     10/20/2022  Discharge date: 10/22/22  Discharge Physician: Rickey Barbara   PCP: Bradd Canary, MD   Recommendations at discharge:    Follow up with PCP in 1-2 Follow up with General Surgery as needed  Discharge Diagnoses: Principal Problem:   SBO (small bowel obstruction) Active Problems:   Thyroid disease   Essential hypertension   GERD (gastroesophageal reflux disease)   Hyperlipidemia   Hypothyroidism   Hypokalemia  Resolved Problems:   * No resolved hospital problems. *  Hospital Course: 82 y.o. female with medical history significant for hypertension, hypothyroidism admitted to the hospital with small bowel obstruction.  States that in retrospect, she started having some intermittent abdominal pain and noticed change in her bowel movements maybe about 10 days ago, however yesterday she had been celebrating her trying to resolve her constipation, later that evening she started having severe epigastric abdominal pain with multiple episodes of nausea and vomiting thereafter. Pt was admitted for concerns of SBO. General Surgery was consulted  Assessment and Plan: SBO (small bowel obstruction)-presents with nausea, vomiting, abdominal pain, bowel obstruction seen on CT.  She has prior history of hysterectomy but no other risk factors for bowel obstruction, no previous SBO.   -pt had NG placed initially -SBO resolved -General Surgery later recommended to clamp NG and diet was ultimately successfully advanced to soft   Hypokalemia -likely due to GI losses from vomiting -Corrected     Thyroid disease -continue home Synthroid     Essential hypertension -continue home lisinopril/hydrochlorothiazide -bp stable and controlled       GERD (gastroesophageal reflux disease) -continue home PPI     Hyperlipidemia -continue statin     Hypothyroidism -continue home dose  Synthroid    Consultants: General Surgery Procedures performed:   Disposition: Home Diet recommendation:  Soft diet  DISCHARGE MEDICATION: Allergies as of 10/22/2022       Reactions   Egg-derived Products Shortness Of Breath, Rash   Sulfa Antibiotics Nausea And Vomiting   Keflex [cephalexin] Rash   Rash   Penicillins Rash   Poison Ivy Extract [poison Ivy Extract] Rash        Medication List     STOP taking these medications    benzonatate 100 MG capsule Commonly known as: TESSALON   sodium chloride 2 % ophthalmic solution Commonly known as: MURO 128       TAKE these medications    aspirin 81 MG tablet Take 81 mg by mouth daily.   Co Q 10 100 MG Caps Take 100 mg by mouth daily.   Fish Oil 1000 MG Cpdr Take 1,000 mg by mouth daily.   lisinopril-hydrochlorothiazide 10-12.5 MG tablet Commonly known as: ZESTORETIC Take 1 tablet by mouth daily.   PROBIOTIC DAILY PO Take 1 capsule by mouth daily.   Synthroid 125 MCG tablet Generic drug: levothyroxine TAKE 1 TABLET (125 MCG TOTAL) BY MOUTH DAILY. SYNTHROID What changed:  how much to take how to take this when to take this additional instructions   vitamin C 1000 MG tablet Take 1,000 mg by mouth daily.   Vitamin D 50 MCG (2000 UT) tablet Take 2,000 Units by mouth daily.        Follow-up Information     Bradd Canary, MD Follow up in 2 week(s).   Specialty: Family Medicine Why: Hospital follow up Contact information: [address]  [place] [address] [phone]         [facility] Follow up.   Why: As needed Contact information: [address] [place] [address]               Discharge Exam: Ceasar Mons Weights   10/20/22 0748  Weight: 76.7 kg   General exam: Awake, laying in bed, in nad Respiratory system: Normal respiratory effort, no wheezing Cardiovascular system: regular rate, s1,  s2 Gastrointestinal system: Soft, nondistended, positive BS Central nervous system: CN2-12 grossly intact, strength intact Extremities: Perfused, no clubbing Skin: Normal skin turgor, no notable skin lesions seen Psychiatry: Mood normal // no visual hallucinations   Condition at discharge: fair  The results of significant diagnostics from this hospitalization (including imaging, microbiology, ancillary and laboratory) are listed below for reference.   Imaging Studies: DG Abd Portable 1V-Small Bowel Obstruction Protocol-initial, 8 hr delay  Result Date: 10/20/2022 CLINICAL DATA:  8 hour delay SBO EXAM: PORTABLE ABDOMEN - 1 VIEW COMPARISON:  Radiograph 10/20/2022 at 2:49 p.m. FINDINGS: The bowel gas pattern is normal. Enteric contrast is seen diffusely throughout the colon. No radio-opaque calculi or other significant radiographic abnormality are seen. Enteric tube tip and side-port in the stomach. Bilateral THA. IMPRESSION: Enteric contrast throughout the colon.  No evidence of obstruction. Electronically Signed   By: Minerva Fester M.D.   On: 10/20/2022 23:56   DG Abd Portable 1V-Small Bowel Protocol-Position Verification  Result Date: 10/20/2022 CLINICAL DATA:  [id number] Encounter for imaging study to confirm nasogastric (NG) tube placement [id number] EXAM: PORTABLE ABDOMEN - 1 VIEW COMPARISON:  CT 10/20/2022 FINDINGS: Nasogastric tube tip and side port overlie the stomach. Moderate colonic stool burden. There are few prominent loops of small bowel in the lower abdomen as seen on recent CT. Retained contrast material noted in the renal collecting systems. Degenerative changes of the spine. IMPRESSION: Nasogastric tube tip and side port overlie the stomach. Electronically Signed   By: Caprice Renshaw M.D.   On: 10/20/2022 15:15   CT Abdomen Pelvis W Contrast  Result Date: 10/20/2022 CLINICAL DATA:  Abdominal pain, acute, nonlocalized EXAM: CT ABDOMEN AND PELVIS WITH CONTRAST TECHNIQUE: Multidetector CT  imaging of the abdomen and pelvis was performed using the standard protocol following bolus administration of intravenous contrast. RADIATION DOSE REDUCTION: This exam was performed according to the departmental dose-optimization program which includes automated exposure control, adjustment of the mA and/or kV according to patient size and/or use of iterative reconstruction technique. CONTRAST:  OMNIPAQUE IOHEXOL 300 MG/ML  SOLN COMPARISON:  06/29/2019 FINDINGS: Lower chest: No acute abnormality. Hepatobiliary: No focal liver abnormality is identified. Cholelithiasis. No gallbladder wall thickening or inflammatory changes by CT. No biliary dilatation. Pancreas: Unremarkable. No pancreatic ductal dilatation or surrounding inflammatory changes. Spleen: Normal in size without focal abnormality. Adrenals/Urinary Tract: Unremarkable adrenal glands. Kidneys enhance symmetrically. No solid renal lesion. No definite stone. Similar prominence of the bilateral renal collecting systems without frank hydronephrosis. No ureteral calculi identified. No definite distal ureteral or bladder stones, evaluation is slightly degraded by metal artifact from patient's hip prostheses. Stomach/Bowel: Small hiatal hernia. Stomach otherwise within normal limits. Duodenal diverticulum again seen. Several fluid-filled loops small bowel, upper limits of normal in caliber. There is some fecalization of small bowel content. Tapered transition point to relatively decompressed distal small bowel within the central low abdomen (series 2, image 48). Extensive diverticular disease throughout the colon. No focally inflamed diverticulum is seen. Vascular/Lymphatic: Scattered  aortoiliac atherosclerotic calcifications without aneurysm. No abdominopelvic lymphadenopathy. Reproductive: Uterus atrophic or surgically absent. No adnexal masses. Other: Trace free fluid within the pelvis. No organized abdominopelvic fluid collection. No pneumoperitoneum. No  abdominal wall hernia. Musculoskeletal: Bilateral total hip arthroplasties. Advanced multilevel degenerative lumbar spondylosis. No acute osseous abnormality. IMPRESSION: 1. Several fluid-filled loops of small bowel, upper limits of normal in caliber with some fecalization of small bowel content. Tapered transition point to relatively decompressed distal small bowel within the central low abdomen. Findings are concerning for a low-grade or partial small bowel obstruction. 2. 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 3. Cholelithiasis without evidence of acute cholecystitis. 4. Aortic atherosclerosis (ICD10-I70.0). Electronically Signed   By: Duanne Guess D.O.   On: 10/20/2022 09:14    Microbiology: Results for orders placed or performed in visit on 09/24/21  Urine Culture     Status: None   Collection Time: 09/24/21  9:18 AM   Specimen: Blood  Result Value Ref Range Status   MICRO NUMBER: [id number]  Final   SPECIMEN QUALITY: Adequate  Final   Sample Source NOT GIVEN  Final   STATUS: FINAL  Final   ISOLATE 1:   Final    Mixed genital flora isolated. These superficial bacteria are not indicative of a urinary tract infection. No further organism identification is warranted on this specimen. If clinically indicated, recollect clean-catch, mid-stream urine and transfer  immediately to Urine Culture Transport Tube.     Labs: CBC: Recent Labs  Lab 10/20/22 0759  WBC 12.3*  HGB 14.6  HCT 41.8  MCV 88.0  PLT 323   Basic Metabolic Panel: Recent Labs  Lab 10/20/22 0759 10/20/22 1638 10/22/22 0352  NA 135  --  139  K 3.2*  --  3.3*  CL 102  --  106  CO2 21*  --  25  GLUCOSE 205*  --  94  BUN 18  --  7*  CREATININE 0.68  --  0.54  CALCIUM 9.6  --  8.2*  MG  --  2.0 2.1   Liver Function Tests: Recent Labs  Lab 10/20/22 0759 10/22/22 0352  AST 19 11*  ALT 14 12  ALKPHOS 59 40  BILITOT 0.8 0.8  PROT 7.7 5.7*  ALBUMIN 4.3 3.1*   CBG: No  results for input(s): "GLUCAP" in the last 168 hours.  Discharge time spent: less than 30 minutes.  Signed: Rickey Barbara, MD [facility] 10/22/2022

## 2022-10-22 NOTE — Progress Notes (Signed)
Mobility Specialist - Progress Note   10/22/22 0920  Mobility  Activity Ambulated with assistance in hallway  Level of Assistance Modified independent, requires aide device or extra time  Assistive Device None  Distance Ambulated (ft) 400 ft  Activity Response Tolerated well  Mobility Referral Yes  $Mobility charge 1 Mobility   Pt received in bed and agreed to mobility, had no issues. Mod I for bed mobility, Mod I for sit to stand and ambulation. Pt returned to chair with all needs met.   Marilynne Halsted Mobility Specialist

## 2022-10-22 NOTE — Progress Notes (Signed)
Pt able to tolerate meals without complications. Daughter at bedside. Discussed AVS with patient verbalizing understanding. I transferred patient downstairs to main lobby and assisted her in the vehicle with her daughter to discharge home. Pt had all bedside belongings.

## 2022-10-23 ENCOUNTER — Telehealth: Payer: Self-pay

## 2022-10-23 NOTE — Transitions of Care (Post Inpatient/ED Visit) (Signed)
   10/23/2022  Name: Felicia Lowe MRN: [id number] DOB: [birth year]  Today's TOC FU Call Status: Today's TOC FU Call Status:: Successful TOC FU Call Competed TOC FU Call Complete Date: 10/23/22  Transition Care Management Follow-up Telephone Call Date of Discharge: 10/22/22 Discharge Facility: [facility]) Type of Discharge: Inpatient Admission Primary Inpatient Discharge Diagnosis:: intestinal obstruction How have you been since you were released from the hospital?: Better Any questions or concerns?: No  Items Reviewed: Did you receive and understand the discharge instructions provided?: Yes Medications obtained and verified?: Yes (Medications Reviewed) Any new allergies since your discharge?: No Dietary orders reviewed?: Yes Do you have support at home?: Yes People in Home: child(ren), adult  Home Care and Equipment/Supplies: Were Home Health Services Ordered?: NA Any new equipment or medical supplies ordered?: NA  Functional Questionnaire: Do you need assistance with bathing/showering or dressing?: No Do you need assistance with meal preparation?: No Do you need assistance with eating?: No Do you have difficulty maintaining continence: No Do you need assistance with getting out of bed/getting out of a chair/moving?: No Do you have difficulty managing or taking your medications?: No  Follow up appointments reviewed: PCP Follow-up appointment confirmed?: No (no avail appt times, sent message to staff to schedule) MD Provider Line Number:[phone] Given: No Specialist Hospital Follow-up appointment confirmed?: NA Do you need transportation to your follow-up appointment?: No Do you understand care options if your condition(s) worsen?: Yes-patient verbalized understanding    SIGNATURE Karena Addison, LPN [facility] Nurse Health Advisor Direct Dial [phone]

## 2022-10-23 NOTE — Telephone Encounter (Signed)
DONE

## 2022-10-23 NOTE — Telephone Encounter (Signed)
I have called pt lvm to call our office back  So we can get her on the books for f/u appt

## 2022-10-28 NOTE — Telephone Encounter (Signed)
Patient called back wanting to set up an appt with Abner Greenspan for a HFU, I am unable to add her to [facility] schedule but offered Melissa to which pt declined. Patient requested Dr. Patsy Lager, but was informed that Dr. Patsy Lager does not do HFU for other MDs. Informed pt that she will received a call from CMA to see if she can be added to Dr. Mariel Aloe schedule.

## 2022-10-30 ENCOUNTER — Telehealth: Payer: Self-pay

## 2022-10-30 ENCOUNTER — Encounter: Payer: Self-pay | Admitting: Family Medicine

## 2022-10-30 NOTE — Telephone Encounter (Signed)
Pt called was able to schedule appt  For 11/05/22 at 2:20

## 2022-11-04 NOTE — Assessment & Plan Note (Signed)
Supplement and monitor 

## 2022-11-04 NOTE — Assessment & Plan Note (Addendum)
Recently hospitalized but has done well since being home. Discussed how to manage any future flares by switching to clear fluids at the first sign of recurrence. Make sure to manage constipation proactively, hydrate well.

## 2022-11-04 NOTE — Assessment & Plan Note (Signed)
hgba1c acceptable, minimize simple carbs. Increase exercise as tolerated.  

## 2022-11-05 ENCOUNTER — Ambulatory Visit: Payer: Medicare HMO | Admitting: Family Medicine

## 2022-11-05 ENCOUNTER — Encounter: Payer: Self-pay | Admitting: Family Medicine

## 2022-11-05 VITALS — BP 130/78 | HR 78 | Temp 97.5°F | Resp 16 | Ht 61.0 in | Wt 167.2 lb

## 2022-11-05 DIAGNOSIS — K56609 Unspecified intestinal obstruction, unspecified as to partial versus complete obstruction: Secondary | ICD-10-CM

## 2022-11-05 DIAGNOSIS — R739 Hyperglycemia, unspecified: Secondary | ICD-10-CM | POA: Diagnosis not present

## 2022-11-05 DIAGNOSIS — D72829 Elevated white blood cell count, unspecified: Secondary | ICD-10-CM | POA: Diagnosis not present

## 2022-11-05 DIAGNOSIS — E876 Hypokalemia: Secondary | ICD-10-CM | POA: Diagnosis not present

## 2022-11-05 DIAGNOSIS — E559 Vitamin D deficiency, unspecified: Secondary | ICD-10-CM

## 2022-11-05 NOTE — Patient Instructions (Signed)
SSRI: Selective Serotonin Reuptake Inhibitor Such as Fluoxetine/Prozac

## 2022-11-05 NOTE — Progress Notes (Addendum)
Subjective:   By signing my name below, I, Vickey Sages, attest that this documentation has been prepared under the direction and in the presence of Bradd Canary, MD., 11/05/2022.   Patient ID: Felicia Lowe, female    DOB: 1940/08/20, 82 y.o.   MRN: [id number]  Chief Complaint  Patient presents with   Hospitalization Follow-up    Hospital Follow up   HPI Patient is in today for an office visit and is accompanied by her daughter.  SBO (small bowel obstruction) Patient was admitted to [facility] ED on 10/20/2022 due to abdominal pain that had started at 8pm the night before. Her pain improved upon intravenous administration of Ondansetron 4 mg. CT scan of the abdomen and pelvis with contrast was completed which revealed partial small bowel obstruction or low-grade bowel obstruction. Today she denies CP/palpitations/SOB/HA/fever/chills/GI or GU symptoms. Since being discharged on 10/22/2022, patient has modified her diet by reducing portion sizes and eating more vegetables because she does not want her abdominal pain to return. Additionally, patient's daughter reports that she appears agitated and stressed due to caring for her husband.  Past Medical History:  Diagnosis Date   Allergy    Arthritis    osteoarthritis, severe, b/l knees &b/l hips  replaced   BPV (benign positional vertigo) 02/19/2013   mild   Chicken pox as a child   GERD (gastroesophageal reflux disease) 05/27/2014   H/O breast biopsy    benign, on left   Hypertension    Kidney stone 2000   nephritis as a child   Measles as a child   Mumps as a child   Obesity    Osteoporosis    Preventative health care 11/25/2014   Pruritus 01/21/2017   Scabies    Skin lesion of face 05/18/2012   Thyroid disease    Unsteady gait 11/25/2014   UTI (urinary tract infection) 12/11/2012   UTI (urinary tract infection)    Viral respiratory illness 05/21/2013   Vitamin D deficiency     Past Surgical History:  Procedure  Laterality Date   ABDOMINAL HYSTERECTOMY     CATARACT EXTRACTION Left 2017   EYE SURGERY Left 2019   cataract removal left   HIP SURGERY     both hips   JOINT REPLACEMENT     KNEE SURGERY     both knees replaced   LITHOTRIPSY     For kidney stones    Family History  Problem Relation Age of Onset   Hyperlipidemia Mother    Hypertension Mother    Heart attack Mother    Other Mother        goiter problems   Cancer Father        lung- smoker   Heart attack Father        X 2   Thyroid disease Brother    Hypertension Son    Rashes / Skin problems Son    Hernia Son        umbilical   Cancer Paternal Grandmother    Macular degeneration Brother    Breast cancer Maternal Aunt    Colon cancer Neg Hx    Esophageal cancer Neg Hx    Rectal cancer Neg Hx    Stomach cancer Neg Hx     Social History   Socioeconomic History   Marital status: Married    Spouse name: Not on file   Number of children: Not on file   Years of education: Not on file  Highest education level: Not on file  Occupational History   Occupation: Retired Warden/ranger  Tobacco Use   Smoking status: Never   Smokeless tobacco: Never  Vaping Use   Vaping Use: Never used  Substance and Sexual Activity   Alcohol use: Not Currently    Comment: Rarely glass of wine   Drug use: No   Sexual activity: Never  Other Topics Concern   Not on file  Social History Narrative   Not on file   Social Determinants of Health   Financial Resource Strain: Low Risk  (02/12/2021)   Overall Financial Resource Strain (CARDIA)    Difficulty of Paying Living Expenses: Not hard at all  Food Insecurity: No Food Insecurity (10/20/2022)   Hunger Vital Sign    Worried About Running Out of Food in the Last Year: Never true    Ran Out of Food in the Last Year: Never true  Transportation Needs: No Transportation Needs (10/20/2022)   PRAPARE - Administrator, Civil Service (Medical): No    Lack of Transportation  (Non-Medical): No  Physical Activity: Insufficiently Active (02/12/2021)   Exercise Vital Sign    Days of Exercise per Week: 7 days    Minutes of Exercise per Session: 20 min  Stress: No Stress Concern Present (02/12/2021)   [employer] of Occupational Health - Occupational Stress Questionnaire    Feeling of Stress : Not at all  Social Connections: Moderately Isolated (02/12/2021)   Social Connection and Isolation Panel [NHANES]    Frequency of Communication with Friends and Family: More than three times a week    Frequency of Social Gatherings with Friends and Family: Once a week    Attends Religious Services: Never    Database administrator or Organizations: No    Attends Banker Meetings: Never    Marital Status: Married  Catering manager Violence: Not At Risk (10/20/2022)   Humiliation, Afraid, Rape, and Kick questionnaire    Fear of Current or Ex-Partner: No    Emotionally Abused: No    Physically Abused: No    Sexually Abused: No    Outpatient Medications Prior to Visit  Medication Sig Dispense Refill   Ascorbic Acid (VITAMIN C) 1000 MG tablet Take 1,000 mg by mouth daily.     aspirin 81 MG tablet Take 81 mg by mouth daily.     Cholecalciferol (VITAMIN D) 2000 UNITS tablet Take 2,000 Units by mouth daily.     Coenzyme Q10 (CO Q 10) 100 MG CAPS Take 100 mg by mouth daily.     lisinopril-hydrochlorothiazide (ZESTORETIC) 10-12.5 MG tablet Take 1 tablet by mouth daily. 90 tablet 3   Omega-3 Fatty Acids (FISH OIL) 1000 MG CPDR Take 1,000 mg by mouth daily.     Probiotic Product (PROBIOTIC DAILY PO) Take 1 capsule by mouth daily.     SYNTHROID 125 MCG tablet TAKE 1 TABLET (125 MCG TOTAL) BY MOUTH DAILY. SYNTHROID (Patient taking differently: Take 125 mcg by mouth daily before breakfast.) 90 tablet 3   No facility-administered medications prior to visit.    Allergies  Allergen Reactions   Egg-Derived Products Shortness Of Breath and Rash   Sulfa Antibiotics  Nausea And Vomiting   Keflex [Cephalexin] Rash    Rash    Penicillins Rash   Poison Ivy Extract [Poison Ivy Extract] Rash    Review of Systems  Constitutional:  Negative for chills and fever.  Respiratory:  Negative for shortness of breath.  Cardiovascular:  Negative for chest pain and palpitations.  Gastrointestinal:  Negative for abdominal pain, blood in stool, constipation, diarrhea, nausea and vomiting.  Genitourinary:  Negative for dysuria, frequency, hematuria and urgency.  Skin:           Neurological:  Negative for headaches.       Objective:    Physical Exam Constitutional:      General: She is not in acute distress.    Appearance: Normal appearance. She is not ill-appearing.  HENT:     Head: Normocephalic and atraumatic.     Right Ear: External ear normal.     Left Ear: External ear normal.     Nose: Nose normal.     Mouth/Throat:     Mouth: Mucous membranes are moist.     Pharynx: Oropharynx is clear.  Eyes:     General:        Right eye: No discharge.        Left eye: No discharge.     Extraocular Movements: Extraocular movements intact.     Conjunctiva/sclera: Conjunctivae normal.     Pupils: Pupils are equal, round, and reactive to light.  Cardiovascular:     Rate and Rhythm: Normal rate and regular rhythm.     Pulses: Normal pulses.     Heart sounds: Normal heart sounds. No murmur heard.    No gallop.  Pulmonary:     Effort: Pulmonary effort is normal. No respiratory distress.     Breath sounds: Normal breath sounds. No wheezing or rales.  Abdominal:     General: Bowel sounds are normal.     Palpations: Abdomen is soft.     Tenderness: There is no abdominal tenderness. There is no guarding.  Musculoskeletal:        General: Normal range of motion.     Cervical back: Normal range of motion.     Right lower leg: No edema.     Left lower leg: No edema.  Skin:    General: Skin is warm and dry.  Neurological:     Mental Status: She is alert and  oriented to person, place, and time.  Psychiatric:        Mood and Affect: Mood normal.        Behavior: Behavior normal.        Judgment: Judgment normal.     BP 130/78 (BP Location: Right Arm, Patient Position: Sitting, Cuff Size: Normal)   Pulse 78   Temp (!) 97.5 F (36.4 C) (Oral)   Resp 16   Ht 5\' 1"  (1.549 m)   Wt 167 lb 3.2 oz (75.8 kg)   SpO2 95%   BMI 31.59 kg/m  Wt Readings from Last 3 Encounters:  11/05/22 167 lb 3.2 oz (75.8 kg)  10/20/22 169 lb (76.7 kg)  09/17/22 171 lb 6.4 oz (77.7 kg)    Diabetic Foot Exam - Simple   No data filed    Lab Results  Component Value Date   WBC 6.2 11/05/2022   HGB 14.3 11/05/2022   HCT 42.1 11/05/2022   PLT 305.0 11/05/2022   GLUCOSE 85 11/05/2022   CHOL 158 08/24/2022   TRIG 133.0 08/24/2022   HDL 51.30 08/24/2022   LDLCALC 80 08/24/2022   ALT 12 11/05/2022   AST 14 11/05/2022   NA 139 11/05/2022   K 3.6 11/05/2022   CL 101 11/05/2022   CREATININE 0.75 11/05/2022   BUN 11 11/05/2022   CO2 28 11/05/2022  TSH 0.54 08/24/2022   INR 1.0 06/29/2019   HGBA1C 5.8 08/24/2022    Lab Results  Component Value Date   TSH 0.54 08/24/2022   Lab Results  Component Value Date   WBC 6.2 11/05/2022   HGB 14.3 11/05/2022   HCT 42.1 11/05/2022   MCV 90.5 11/05/2022   PLT 305.0 11/05/2022   Lab Results  Component Value Date   NA 139 11/05/2022   K 3.6 11/05/2022   CO2 28 11/05/2022   GLUCOSE 85 11/05/2022   BUN 11 11/05/2022   CREATININE 0.75 11/05/2022   BILITOT 0.4 11/05/2022   ALKPHOS 50 11/05/2022   AST 14 11/05/2022   ALT 12 11/05/2022   PROT 6.9 11/05/2022   ALBUMIN 4.2 11/05/2022   CALCIUM 9.5 11/05/2022   ANIONGAP 8 10/22/2022   GFR 74.50 11/05/2022   Lab Results  Component Value Date   CHOL 158 08/24/2022   Lab Results  Component Value Date   HDL 51.30 08/24/2022   Lab Results  Component Value Date   LDLCALC 80 08/24/2022   Lab Results  Component Value Date   TRIG 133.0 08/24/2022    Lab Results  Component Value Date   CHOLHDL 3 08/24/2022   Lab Results  Component Value Date   HGBA1C 5.8 08/24/2022      Assessment & Plan:  Healthy Lifestyle: Encouraged 6-8 hours of sleep, heart healthy diet, 60-80 oz of non-alcohol/non-caffeinated fluids, and minimum of 4000 steps daily.  Anxiety/Stress: Patient will consider starting SSRI's such as Fluoxetine to manage anxiety/stress.  Labs: Routine blood work ordered today. Problem List Items Addressed This Visit     Hypokalemia   Relevant Orders   Comprehensive metabolic panel (Completed)   Hyperglycemia    hgba1c acceptable, minimize simple carbs. Increase exercise as tolerated.       Vitamin D deficiency    Supplement and monitor      Hypocalcemia    Resolved on recheck      Leukocytosis    Repeat  CBC with diff normalized      Relevant Orders   CBC with Differential/Platelet (Completed)   SBO (small bowel obstruction) (HCC) - Primary    Recently hospitalized but has done well since being home. Discussed how to manage any future flares by switching to clear fluids at the first sign of recurrence. Make sure to manage constipation proactively, hydrate well.      No orders of the defined types were placed in this encounter.  I, Danise Edge, MD, personally preformed the services described in this documentation.  All medical record entries made by the scribe were at my direction and in my presence.  I have reviewed the chart and discharge instructions (if applicable) and agree that the record reflects my personal performance and is accurate and complete. 11/05/2022  I,Mohammed Iqbal,acting as a scribe for Danise Edge, MD.,have documented all relevant documentation on the behalf of Danise Edge, MD,as directed by  Danise Edge, MD while in the presence of Danise Edge, MD.  Danise Edge, MD

## 2022-11-06 LAB — COMPREHENSIVE METABOLIC PANEL
ALT: 12 U/L (ref 0–35)
AST: 14 U/L (ref 0–37)
Albumin: 4.2 g/dL (ref 3.5–5.2)
Alkaline Phosphatase: 50 U/L (ref 39–117)
BUN: 11 mg/dL (ref 6–23)
CO2: 28 mEq/L (ref 19–32)
Calcium: 9.5 mg/dL (ref 8.4–10.5)
Chloride: 101 mEq/L (ref 96–112)
Creatinine, Ser: 0.75 mg/dL (ref 0.40–1.20)
GFR: 74.5 mL/min (ref >60.00)
Glucose, Bld: 85 mg/dL (ref 70–99)
Potassium: 3.6 mEq/L (ref 3.5–5.1)
Sodium: 139 mEq/L (ref 135–145)
Total Bilirubin: 0.4 mg/dL (ref 0.2–1.2)
Total Protein: 6.9 g/dL (ref 6.0–8.3)

## 2022-11-06 LAB — CBC WITH DIFFERENTIAL/PLATELET
Basophils Absolute: 0.1 10*3/uL (ref 0.0–0.1)
Basophils Relative: 1 % (ref 0.0–3.0)
Eosinophils Absolute: 0.2 10*3/uL (ref 0.0–0.7)
Eosinophils Relative: 3.2 % (ref 0.0–5.0)
HCT: 42.1 % (ref 36.0–46.0)
Hemoglobin: 14.3 g/dL (ref 12.0–15.0)
Lymphocytes Relative: 22 % (ref 12.0–46.0)
Lymphs Abs: 1.4 10*3/uL (ref 0.7–4.0)
MCHC: 33.9 g/dL (ref 30.0–36.0)
MCV: 90.5 fl (ref 78.0–100.0)
Monocytes Absolute: 0.6 10*3/uL (ref 0.1–1.0)
Monocytes Relative: 9.9 % (ref 3.0–12.0)
Neutro Abs: 4 10*3/uL (ref 1.4–7.7)
Neutrophils Relative %: 63.9 % (ref 43.0–77.0)
Platelets: 305 10*3/uL (ref 150.0–400.0)
RBC: 4.65 Mil/uL (ref 3.87–5.11)
RDW: 14.1 % (ref 11.5–15.5)
WBC: 6.2 10*3/uL (ref 4.0–10.5)

## 2022-11-07 ENCOUNTER — Encounter: Payer: Self-pay | Admitting: Family Medicine

## 2022-11-07 DIAGNOSIS — D72829 Elevated white blood cell count, unspecified: Secondary | ICD-10-CM | POA: Insufficient documentation

## 2022-11-07 NOTE — Assessment & Plan Note (Signed)
Resolved on recheck 

## 2022-11-07 NOTE — Assessment & Plan Note (Signed)
Repeat  CBC with diff normalized

## 2022-11-19 ENCOUNTER — Other Ambulatory Visit: Payer: Self-pay | Admitting: Family Medicine

## 2022-11-19 MED ORDER — CLINDAMYCIN HCL 150 MG PO CAPS
150.0000 mg | ORAL_CAPSULE | Freq: Three times a day (TID) | ORAL | 0 refills | Status: DC
Start: 2022-11-19 — End: 2023-03-18

## 2022-12-01 ENCOUNTER — Encounter: Payer: Self-pay | Admitting: Family Medicine

## 2023-02-18 ENCOUNTER — Encounter (INDEPENDENT_AMBULATORY_CARE_PROVIDER_SITE_OTHER): Payer: Self-pay

## 2023-03-18 ENCOUNTER — Ambulatory Visit (INDEPENDENT_AMBULATORY_CARE_PROVIDER_SITE_OTHER): Payer: Medicare HMO | Admitting: *Deleted

## 2023-03-18 VITALS — BP 136/74 | HR 80 | Ht 61.0 in | Wt 162.2 lb

## 2023-03-18 DIAGNOSIS — Z0000 Encounter for general adult medical examination without abnormal findings: Secondary | ICD-10-CM

## 2023-03-18 DIAGNOSIS — Z1231 Encounter for screening mammogram for malignant neoplasm of breast: Secondary | ICD-10-CM

## 2023-03-18 NOTE — Patient Instructions (Signed)
Felicia Lowe , Thank you for taking time to come for your Medicare Wellness Visit. I appreciate your ongoing commitment to your health goals. Please review the following plan we discussed and let me know if I can assist you in the future.     This is a list of the screening recommended for you and due dates:  Health Maintenance  Topic Date Due   COVID-19 Vaccine (7 - 2023-24 season) 03/07/2023   Medicare Annual Wellness Visit  03/17/2024   DTaP/Tdap/Td vaccine (3 - Td or Tdap) 03/06/2026   Pneumonia Vaccine  Completed   Flu Shot  Completed   DEXA scan (bone density measurement)  Completed   Zoster (Shingles) Vaccine  Completed   HPV Vaccine  Aged Out   Hepatitis C Screening  Discontinued    Next appointment: Follow up in one year for your annual wellness visit.   Preventive Care 12 Years and Older, Female Preventive care refers to lifestyle choices and visits with your health care provider that can promote health and wellness. What does preventive care include? A yearly physical exam. This is also called an annual well check. Dental exams once or twice a year. Routine eye exams. Ask your health care provider how often you should have your eyes checked. Personal lifestyle choices, including: Daily care of your teeth and gums. Regular physical activity. Eating a healthy diet. Avoiding tobacco and drug use. Limiting alcohol use. Practicing safe sex. Taking low-dose aspirin every day. Taking vitamin and mineral supplements as recommended by your health care provider. What happens during an annual well check? The services and screenings done by your health care provider during your annual well check will depend on your age, overall health, lifestyle risk factors, and family history of disease. Counseling  Your health care provider may ask you questions about your: Alcohol use. Tobacco use. Drug use. Emotional well-being. Home and relationship well-being. Sexual  activity. Eating habits. History of falls. Memory and ability to understand (cognition). Work and work Astronomer. Reproductive health. Screening  You may have the following tests or measurements: Height, weight, and BMI. Blood pressure. Lipid and cholesterol levels. These may be checked every 5 years, or more frequently if you are over 49 years old. Skin check. Lung cancer screening. You may have this screening every year starting at age 34 if you have a 30-pack-year history of smoking and currently smoke or have quit within the past 15 years. Fecal occult blood test (FOBT) of the stool. You may have this test every year starting at age 67. Flexible sigmoidoscopy or colonoscopy. You may have a sigmoidoscopy every 5 years or a colonoscopy every 10 years starting at age 38. Hepatitis C blood test. Hepatitis B blood test. Sexually transmitted disease (STD) testing. Diabetes screening. This is done by checking your blood sugar (glucose) after you have not eaten for a while (fasting). You may have this done every 1-3 years. Bone density scan. This is done to screen for osteoporosis. You may have this done starting at age 66. Mammogram. This may be done every 1-2 years. Talk to your health care provider about how often you should have regular mammograms. Talk with your health care provider about your test results, treatment options, and if necessary, the need for more tests. Vaccines  Your health care provider may recommend certain vaccines, such as: Influenza vaccine. This is recommended every year. Tetanus, diphtheria, and acellular pertussis (Tdap, Td) vaccine. You may need a Td booster every 10 years. Zoster vaccine. You  may need this after age 61. Pneumococcal 13-valent conjugate (PCV13) vaccine. One dose is recommended after age 25. Pneumococcal polysaccharide (PPSV23) vaccine. One dose is recommended after age 54. Talk to your health care provider about which screenings and vaccines  you need and how often you need them. This information is not intended to replace advice given to you by your health care provider. Make sure you discuss any questions you have with your health care provider. Document Released: 07/19/2015 Document Revised: 03/11/2016 Document Reviewed: 04/23/2015 Elsevier Interactive Patient Education  2017 [employer].  Fall Prevention in the Home Falls can cause injuries. They can happen to people of all ages. There are many things you can do to make your home safe and to help prevent falls. What can I do on the outside of my home? Regularly fix the edges of walkways and driveways and fix any cracks. Remove anything that might make you trip as you walk through a door, such as a raised step or threshold. Trim any bushes or trees on the path to your home. Use bright outdoor lighting. Clear any walking paths of anything that might make someone trip, such as rocks or tools. Regularly check to see if handrails are loose or broken. Make sure that both sides of any steps have handrails. Any raised decks and porches should have guardrails on the edges. Have any leaves, snow, or ice cleared regularly. Use sand or salt on walking paths during winter. Clean up any spills in your garage right away. This includes oil or grease spills. What can I do in the bathroom? Use night lights. Install grab bars by the toilet and in the tub and shower. Do not use towel bars as grab bars. Use non-skid mats or decals in the tub or shower. If you need to sit down in the shower, use a plastic, non-slip stool. Keep the floor dry. Clean up any water that spills on the floor as soon as it happens. Remove soap buildup in the tub or shower regularly. Attach bath mats securely with double-sided non-slip rug tape. Do not have throw rugs and other things on the floor that can make you trip. What can I do in the bedroom? Use night lights. Make sure that you have a light by your bed that  is easy to reach. Do not use any sheets or blankets that are too big for your bed. They should not hang down onto the floor. Have a firm chair that has side arms. You can use this for support while you get dressed. Do not have throw rugs and other things on the floor that can make you trip. What can I do in the kitchen? Clean up any spills right away. Avoid walking on wet floors. Keep items that you use a lot in easy-to-reach places. If you need to reach something above you, use a strong step stool that has a grab bar. Keep electrical cords out of the way. Do not use floor polish or wax that makes floors slippery. If you must use wax, use non-skid floor wax. Do not have throw rugs and other things on the floor that can make you trip. What can I do with my stairs? Do not leave any items on the stairs. Make sure that there are handrails on both sides of the stairs and use them. Fix handrails that are broken or loose. Make sure that handrails are as long as the stairways. Check any carpeting to make sure that it is firmly  attached to the stairs. Fix any carpet that is loose or worn. Avoid having throw rugs at the top or bottom of the stairs. If you do have throw rugs, attach them to the floor with carpet tape. Make sure that you have a light switch at the top of the stairs and the bottom of the stairs. If you do not have them, ask someone to add them for you. What else can I do to help prevent falls? Wear shoes that: Do not have high heels. Have rubber bottoms. Are comfortable and fit you well. Are closed at the toe. Do not wear sandals. If you use a stepladder: Make sure that it is fully opened. Do not climb a closed stepladder. Make sure that both sides of the stepladder are locked into place. Ask someone to hold it for you, if possible. Clearly mark and make sure that you can see: Any grab bars or handrails. First and last steps. Where the edge of each step is. Use tools that help you  move around (mobility aids) if they are needed. These include: Canes. Walkers. Scooters. Crutches. Turn on the lights when you go into a dark area. Replace any light bulbs as soon as they burn out. Set up your furniture so you have a clear path. Avoid moving your furniture around. If any of your floors are uneven, fix them. If there are any pets around you, be aware of where they are. Review your medicines with your doctor. Some medicines can make you feel dizzy. This can increase your chance of falling. Ask your doctor what other things that you can do to help prevent falls. This information is not intended to replace advice given to you by your health care provider. Make sure you discuss any questions you have with your health care provider. Document Released: 04/18/2009 Document Revised: 11/28/2015 Document Reviewed: 07/27/2014 Elsevier Interactive Patient Education  2017 [employer].

## 2023-03-18 NOTE — Progress Notes (Signed)
Subjective:   Felicia Lowe is a 82 y.o. female who presents for Medicare Annual (Subsequent) preventive examination.  Visit Complete: In person   Review of Systems     Cardiac Risk Factors include: advanced age (>60men, >38 women);obesity (BMI >30kg/m2);hypertension;dyslipidemia     Objective:    Today's Vitals   03/18/23 0934  BP: 136/74  Pulse: 80  Weight: 162 lb 3.2 oz (73.6 kg)  Height: 5\' 1"  (1.549 m)   Body mass index is 30.65 kg/m.     03/18/2023    9:44 AM 10/20/2022    2:26 PM 10/20/2022    7:49 AM 03/16/2022    9:44 AM 02/12/2021    1:45 PM 10/26/2019   10:19 AM 06/29/2019   11:02 AM  Advanced Directives  Does Patient Have a Medical Advance Directive? Yes Yes Yes Yes Yes No No  Type of Estate agent of [place];Living will Healthcare Power of [place];Living will Living will;Healthcare Power of [employer] Power of [place];Living will Healthcare Power of [place];Living will    Does patient want to make changes to medical advance directive? No - Patient declined No - Patient declined  No - Patient declined     Copy of Healthcare Power of Attorney in Chart? No - copy requested No - copy requested No - copy requested No - copy requested No - copy requested    Would patient like information on creating a medical advance directive?      No - Patient declined Yes (ED - Information included in AVS)    Current Medications (verified) Outpatient Encounter Medications as of 03/18/2023  Medication Sig   Ascorbic Acid (VITAMIN C) 1000 MG tablet Take 1,000 mg by mouth daily.   aspirin 81 MG tablet Take 81 mg by mouth daily.   Cholecalciferol (VITAMIN D) 2000 UNITS tablet Take 2,000 Units by mouth daily.   Coenzyme Q10 (CO Q 10) 100 MG CAPS Take 100 mg by mouth daily.   lisinopril-hydrochlorothiazide (ZESTORETIC) 10-12.5 MG tablet Take 1 tablet by mouth daily.   Omega-3 Fatty Acids (FISH OIL) 1000 MG CPDR Take 1,000 mg by mouth daily.    Probiotic Product (PROBIOTIC DAILY PO) Take 1 capsule by mouth daily.   SYNTHROID 125 MCG tablet TAKE 1 TABLET (125 MCG TOTAL) BY MOUTH DAILY. SYNTHROID (Patient taking differently: Take 125 mcg by mouth daily before breakfast.)   [DISCONTINUED] clindamycin (CLEOCIN) 150 MG capsule Take 1 capsule (150 mg total) by mouth 3 (three) times daily.   No facility-administered encounter medications on file as of 03/18/2023.    Allergies (verified) Egg-derived products, Sulfa antibiotics, Keflex [cephalexin], Penicillins, and Poison ivy extract [poison ivy extract]   History: Past Medical History:  Diagnosis Date   Allergy    Arthritis    osteoarthritis, severe, b/l knees &b/l hips  replaced   BPV (benign positional vertigo) 02/19/2013   mild   Chicken pox as a child   GERD (gastroesophageal reflux disease) 05/27/2014   H/O breast biopsy    benign, on left   Hypertension    Kidney stone 2000   nephritis as a child   Measles as a child   Mumps as a child   Obesity    Osteoporosis    Preventative health care 11/25/2014   Pruritus 01/21/2017   Scabies    Skin lesion of face 05/18/2012   Thyroid disease    Unsteady gait 11/25/2014   UTI (urinary tract infection) 12/11/2012   UTI (urinary tract infection)  Viral respiratory illness 05/21/2013   Vitamin D deficiency    Past Surgical History:  Procedure Laterality Date   ABDOMINAL HYSTERECTOMY     CATARACT EXTRACTION Left 2017   EYE SURGERY Left 2019   cataract removal left   HIP SURGERY     both hips   JOINT REPLACEMENT     KNEE SURGERY     both knees replaced   LITHOTRIPSY     For kidney stones   Family History  Problem Relation Age of Onset   Hyperlipidemia Mother    Hypertension Mother    Heart attack Mother    Other Mother        goiter problems   Cancer Father        lung- smoker   Heart attack Father        X 2   Thyroid disease Brother    Hypertension Son    Rashes / Skin problems Son    Hernia Son         umbilical   Cancer Paternal Grandmother    Macular degeneration Brother    Breast cancer Maternal Aunt    Colon cancer Neg Hx    Esophageal cancer Neg Hx    Rectal cancer Neg Hx    Stomach cancer Neg Hx    Social History   Socioeconomic History   Marital status: Married    Spouse name: Not on file   Number of children: Not on file   Years of education: Not on file   Highest education level: Not on file  Occupational History   Occupation: Retired Warden/ranger  Tobacco Use   Smoking status: Never   Smokeless tobacco: Never  Vaping Use   Vaping status: Never Used  Substance and Sexual Activity   Alcohol use: Not Currently    Comment: Rarely glass of wine   Drug use: No   Sexual activity: Never  Other Topics Concern   Not on file  Social History Narrative   Not on file   Social Determinants of Health   Financial Resource Strain: Low Risk  (03/18/2023)   Overall Financial Resource Strain (CARDIA)    Difficulty of Paying Living Expenses: Not hard at all  Food Insecurity: No Food Insecurity (10/20/2022)   Hunger Vital Sign    Worried About Running Out of Food in the Last Year: Never true    Ran Out of Food in the Last Year: Never true  Transportation Needs: No Transportation Needs (10/20/2022)   PRAPARE - Administrator, Civil Service (Medical): No    Lack of Transportation (Non-Medical): No  Physical Activity: Inactive (03/18/2023)   Exercise Vital Sign    Days of Exercise per Week: 0 days    Minutes of Exercise per Session: 0 min  Stress: No Stress Concern Present (03/18/2023)   [employer] of Occupational Health - Occupational Stress Questionnaire    Feeling of Stress : Not at all  Social Connections: Socially Integrated (03/18/2023)   Social Connection and Isolation Panel [NHANES]    Frequency of Communication with Friends and Family: More than three times a week    Frequency of Social Gatherings with Friends and Family: Twice a week    Attends  Religious Services: More than 4 times per year    Active Member of [employer] or Organizations: Yes    Attends Engineer, structural: More than 4 times per year    Marital Status: Married    Tobacco Counseling Counseling  given: Not Answered   Clinical Intake:  Pre-visit preparation completed: Yes  Pain : No/denies pain  BMI - recorded: 30.65 Nutritional Status: BMI > 30  Obese Nutritional Risks: None Diabetes: No  How often do you need to have someone help you when you read instructions, pamphlets, or other written materials from your doctor or pharmacy?: 1 - Never  Interpreter Needed?: No  Information entered by :: Donne Anon, CMA   Activities of Daily Living    03/18/2023    9:42 AM 10/20/2022    2:30 PM  In your present state of health, do you have any difficulty performing the following activities:  Hearing? 0   Vision? 0   Difficulty concentrating or making decisions? 0   Walking or climbing stairs? 0   Dressing or bathing? 0   Doing errands, shopping? 0 0  Preparing Food and eating ? N   Using the Toilet? N   In the past six months, have you accidently leaked urine? Y   Do you have problems with loss of bowel control? N   Managing your Medications? N   Managing your Finances? N   Housekeeping or managing your Housekeeping? N     Patient Care Team: Bradd Canary, MD as PCP - General (Family Medicine) Rossie Muskrat, MD as Consulting Physician (Rheumatology) Irena Cords, Enzo Montgomery, MD as Consulting Physician (Allergy and Immunology)  Indicate any recent Medical Services you may have received from other than [facility] providers in the past year (date may be approximate).     Assessment:   This is a routine wellness examination for Cammy.  Hearing/Vision screen No results found.   Goals Addressed   None    Depression Screen    03/18/2023    9:52 AM 11/05/2022    2:18 PM 03/16/2022    9:50 AM 09/24/2021    8:55 AM 02/12/2021    1:48 PM 10/26/2019    10:27 AM 10/24/2018   10:32 AM  PHQ 2/9 Scores  PHQ - 2 Score 0 0 0 0 1 0 0  PHQ- 9 Score  0         Fall Risk    03/18/2023    9:48 AM 11/05/2022    2:18 PM 03/16/2022    9:49 AM 09/24/2021    8:55 AM 02/12/2021    1:47 PM  Fall Risk   Falls in the past year? 0 0 0 0 0  Number falls in past yr: 0 0 0 0 0  Injury with Fall? 0 0 0 0 0  Risk for fall due to : No Fall Risks  No Fall Risks No Fall Risks   Follow up Falls evaluation completed Falls evaluation completed Falls evaluation completed Falls evaluation completed Falls prevention discussed    MEDICARE RISK AT HOME: Medicare Risk at Home Any stairs in or around the home?: No If so, are there any without handrails?: No Home free of loose throw rugs in walkways, pet beds, electrical cords, etc?: Yes Adequate lighting in your home to reduce risk of falls?: Yes Life alert?: No Use of a cane, walker or w/c?: No Grab bars in the bathroom?: Yes Shower chair or bench in shower?: Yes Elevated toilet seat or a handicapped toilet?: Yes (comfort height)  TIMED UP AND GO:  Was the test performed?  Yes  Length of time to ambulate 10 feet: 7 sec Gait slow and steady without use of assistive device    Cognitive Function:  10/19/2016    7:58 AM  MMSE - Mini Mental State Exam  Orientation to time 5  Orientation to Place 5  Registration 3  Attention/ Calculation 5  Recall 3  Language- name 2 objects 2  Language- repeat 1  Language- follow 3 step command 3  Language- read & follow direction 1  Write a sentence 1  Copy design 0  Total score 29        03/18/2023   10:01 AM 03/16/2022    9:58 AM  6CIT Screen  What Year? 0 points 0 points  What month? 0 points 0 points  What time? 0 points 0 points  Count back from 20 0 points 0 points  Months in reverse 0 points 0 points  Repeat phrase 8 points 0 points  Total Score 8 points 0 points    Immunizations Immunization History  Administered Date(s) Administered   COVID-19,  mRNA, vaccine(Comirnaty)12 years and older 06/04/2022   Fluad Quad(high Dose 65+) 03/16/2022   Influenza Inj Mdck Quad Pf 04/07/2019   Influenza, High Dose Seasonal PF 03/12/2023   Influenza, Quadrivalent, Recombinant, Inj, Pf 05/15/2016, 05/25/2017, 05/30/2018   Influenza,trivalent, recombinat, inj, PF 05/17/2014, 05/07/2015   Influenza-Unspecified 05/02/2020, 04/18/2021   PFIZER Comirnaty(Gray Top)Covid-19 Tri-Sucrose Vaccine 11/12/2020   PFIZER(Purple Top)SARS-COV-2 Vaccination 07/15/2019, 08/05/2019, 04/03/2020   Pfizer Covid-19 Vaccine Bivalent Booster 42yrs & up 04/28/2021   Pneumococcal Conjugate-13 03/06/2016   Pneumococcal Polysaccharide-23 07/06/2010   Td 03/06/2016   Tdap 07/06/2001   Zoster Recombinant(Shingrix) 08/23/2018, 12/05/2018   Zoster, Live 04/06/2011    TDAP status: Up to date  Flu Vaccine status: Up to date  Pneumococcal vaccine status: Up to date  Covid-19 vaccine status: Information provided on how to obtain vaccines.   Qualifies for Shingles Vaccine? Yes   Zostavax completed Yes   Shingrix Completed?: Yes  Screening Tests Health Maintenance  Topic Date Due   COVID-19 Vaccine (7 - 2023-24 season) 03/07/2023   Medicare Annual Wellness (AWV)  03/17/2023   DTaP/Tdap/Td (3 - Td or Tdap) 03/06/2026   Pneumonia Vaccine 4+ Years old  Completed   INFLUENZA VACCINE  Completed   DEXA SCAN  Completed   Zoster Vaccines- Shingrix  Completed   HPV VACCINES  Aged Out   Hepatitis C Screening  Discontinued    Health Maintenance  Health Maintenance Due  Topic Date Due   COVID-19 Vaccine (7 - 2023-24 season) 03/07/2023   Medicare Annual Wellness (AWV)  03/17/2023    Colorectal cancer screening: No longer required.   Mammogram status: Ordered 03/18/23. Pt provided with contact info and advised to call to schedule appt.   Bone Density status: Completed 10/27/21. Results reflect: Bone density results: OSTEOPENIA. Repeat every 2 years.  Lung Cancer  Screening: (Low Dose CT Chest recommended if Age 69-80 years, 20 pack-year currently smoking OR have quit w/in 15years.) does not qualify.    Additional Screening:  Hepatitis C Screening: does not qualify  Vision Screening: Recommended annual ophthalmology exams for early detection of glaucoma and other disorders of the eye. Is the patient up to date with their annual eye exam?  Yes  Who is the provider or what is the name of the office in which the patient attends annual eye exams? Can't remember name at this time If pt is not established with a provider, would they like to be referred to a provider to establish care? No .   Dental Screening: Recommended annual dental exams for proper oral hygiene  Diabetic Foot Exam:  N/a  Community Resource Referral / Chronic Care Management: CRR required this visit?  No   CCM required this visit?  No     Plan:     I have personally reviewed and noted the following in the patient's chart:   Medical and social history Use of alcohol, tobacco or illicit drugs  Current medications and supplements including opioid prescriptions. Patient is not currently taking opioid prescriptions. Functional ability and status Nutritional status Physical activity Advanced directives List of other physicians Hospitalizations, surgeries, and ER visits in previous 12 months Vitals Screenings to include cognitive, depression, and falls Referrals and appointments  In addition, I have reviewed and discussed with patient certain preventive protocols, quality metrics, and best practice recommendations. A written personalized care plan for preventive services as well as general preventive health recommendations were provided to patient.     Donne Anon, CMA   03/18/2023   After Visit Summary: sent to mychart  Nurse Notes: None

## 2023-03-25 ENCOUNTER — Telehealth (HOSPITAL_BASED_OUTPATIENT_CLINIC_OR_DEPARTMENT_OTHER): Payer: Self-pay

## 2023-05-10 ENCOUNTER — Encounter: Payer: Self-pay | Admitting: Family Medicine

## 2023-06-21 NOTE — Assessment & Plan Note (Signed)
maintain heart healthy diet, increase exercise, avoid trans fats, consider a krill oil cap daily.

## 2023-06-21 NOTE — Assessment & Plan Note (Signed)
Supplement and monitor 

## 2023-06-21 NOTE — Assessment & Plan Note (Signed)
Monitor and report any concerns. no changes to meds. Encouraged heart healthy diet such as the DASH diet and exercise as tolerated.  

## 2023-06-21 NOTE — Assessment & Plan Note (Signed)
hgba1c acceptable, minimize simple carbs. Increase exercise as tolerated.  

## 2023-06-21 NOTE — Assessment & Plan Note (Signed)
On Levothyroxine, continue to monitor 

## 2023-06-21 NOTE — Assessment & Plan Note (Signed)
Encouraged to get adequate exercise, calcium and vitamin d intake 

## 2023-06-22 ENCOUNTER — Ambulatory Visit: Payer: Medicare HMO | Admitting: Family Medicine

## 2023-06-22 ENCOUNTER — Encounter: Payer: Self-pay | Admitting: Family Medicine

## 2023-06-22 VITALS — BP 132/72 | HR 78 | Temp 98.0°F | Resp 18 | Ht 62.0 in | Wt 163.2 lb

## 2023-06-22 DIAGNOSIS — E559 Vitamin D deficiency, unspecified: Secondary | ICD-10-CM

## 2023-06-22 DIAGNOSIS — E039 Hypothyroidism, unspecified: Secondary | ICD-10-CM | POA: Diagnosis not present

## 2023-06-22 DIAGNOSIS — R350 Frequency of micturition: Secondary | ICD-10-CM

## 2023-06-22 DIAGNOSIS — R739 Hyperglycemia, unspecified: Secondary | ICD-10-CM

## 2023-06-22 DIAGNOSIS — E785 Hyperlipidemia, unspecified: Secondary | ICD-10-CM | POA: Diagnosis not present

## 2023-06-22 DIAGNOSIS — I10 Essential (primary) hypertension: Secondary | ICD-10-CM | POA: Diagnosis not present

## 2023-06-22 DIAGNOSIS — M8580 Other specified disorders of bone density and structure, unspecified site: Secondary | ICD-10-CM

## 2023-06-22 NOTE — Progress Notes (Signed)
Subjective:    Patient ID: Felicia Lowe, female    DOB: 1940/07/22, 82 y.o.   MRN: [id number]  Chief Complaint  Patient presents with  . Follow-up    HPI Discussed the use of AI scribe software for clinical note transcription with the patient, who gave verbal consent to proceed.  History of Present Illness   The patient, with a history of restless leg syndrome, muscle cramps and sleep disturbances, reports a significant improvement in leg cramps after adjusting the timing of her medication to mid-afternoon.   The patient also reports experiencing dry mouth at night, which she suspects may be due to snoring or mouth breathing. She has not yet tried using a humidifier to alleviate this symptom. She also mentions a decrease in her vegetable intake and an increase in peristalsis since April.  The patient expresses anxiety about caring for her husband, who is resistant to outside help. She is considering utilizing her insurance for home health assistance but is concerned about her husband's acceptance of this help.  The patient also mentions a history of nephritis and recent surgery for a comprehensive labral repair. She is considering physical therapy for strength and gait improvement.        Past Medical History:  Diagnosis Date  . Allergy   . Arthritis    osteoarthritis, severe, b/l knees &b/l hips  replaced  . BPV (benign positional vertigo) 02/19/2013   mild  . Chicken pox as a child  . GERD (gastroesophageal reflux disease) 05/27/2014  . H/O breast biopsy    benign, on left  . Hypertension   . Kidney stone 2000   nephritis as a child  . Measles as a child  . Mumps as a child  . Obesity   . Osteoporosis   . Preventative health care 11/25/2014  . Pruritus 01/21/2017  . Scabies   . Skin lesion of face 05/18/2012  . Thyroid disease   . Unsteady gait 11/25/2014  . UTI (urinary tract infection) 12/11/2012  . UTI (urinary tract infection)   . Viral respiratory illness  05/21/2013  . Vitamin D deficiency     Past Surgical History:  Procedure Laterality Date  . ABDOMINAL HYSTERECTOMY    . CATARACT EXTRACTION Left 2017  . EYE SURGERY Left 2019   cataract removal left  . HIP SURGERY     both hips  . JOINT REPLACEMENT    . KNEE SURGERY     both knees replaced  . LITHOTRIPSY     For kidney stones    Family History  Problem Relation Age of Onset  . Hyperlipidemia Mother   . Hypertension Mother   . Heart attack Mother   . Other Mother        goiter problems  . Cancer Father        lung- smoker  . Heart attack Father        X 2  . Thyroid disease Brother   . Hypertension Son   . Rashes / Skin problems Son   . Hernia Son        umbilical  . Cancer Paternal Grandmother   . Macular degeneration Brother   . Breast cancer Maternal Aunt   . Colon cancer Neg Hx   . Esophageal cancer Neg Hx   . Rectal cancer Neg Hx   . Stomach cancer Neg Hx     Social History   Socioeconomic History  . Marital status: Married    Spouse name: Not on  file  . Number of children: Not on file  . Years of education: Not on file  . Highest education level: Not on file  Occupational History  . Occupation: Retired Warden/ranger  Tobacco Use  . Smoking status: Never  . Smokeless tobacco: Never  Vaping Use  . Vaping status: Never Used  Substance and Sexual Activity  . Alcohol use: Not Currently    Comment: Rarely glass of wine  . Drug use: No  . Sexual activity: Never  Other Topics Concern  . Not on file  Social History Narrative  . Not on file   Social Drivers of Health   Financial Resource Strain: Low Risk  (03/18/2023)   Overall Financial Resource Strain (CARDIA)   . Difficulty of Paying Living Expenses: Not hard at all  Food Insecurity: No Food Insecurity (10/20/2022)   Hunger Vital Sign   . Worried About Programme researcher, broadcasting/film/video in the Last Year: Never true   . Ran Out of Food in the Last Year: Never true  Transportation Needs: No Transportation  Needs (10/20/2022)   PRAPARE - Transportation   . Lack of Transportation (Medical): No   . Lack of Transportation (Non-Medical): No  Physical Activity: Inactive (03/18/2023)   Exercise Vital Sign   . Days of Exercise per Week: 0 days   . Minutes of Exercise per Session: 0 min  Stress: No Stress Concern Present (03/18/2023)   [employer] of Occupational Health - Occupational Stress Questionnaire   . Feeling of Stress : Not at all  Social Connections: Socially Integrated (03/18/2023)   Social Connection and Isolation Panel [NHANES]   . Frequency of Communication with Friends and Family: More than three times a week   . Frequency of Social Gatherings with Friends and Family: Twice a week   . Attends Religious Services: More than 4 times per year   . Active Member of Clubs or Organizations: Yes   . Attends Banker Meetings: More than 4 times per year   . Marital Status: Married  Catering manager Violence: Not At Risk (10/20/2022)   Humiliation, Afraid, Rape, and Kick questionnaire   . Fear of Current or Ex-Partner: No   . Emotionally Abused: No   . Physically Abused: No   . Sexually Abused: No    Outpatient Medications Prior to Visit  Medication Sig Dispense Refill  . Ascorbic Acid (VITAMIN C) 1000 MG tablet Take 1,000 mg by mouth daily.    Marland Kitchen aspirin 81 MG tablet Take 81 mg by mouth daily.    . Cholecalciferol (VITAMIN D) 2000 UNITS tablet Take 2,000 Units by mouth daily.    . Coenzyme Q10 (CO Q 10) 100 MG CAPS Take 100 mg by mouth daily.    Marland Kitchen lisinopril-hydrochlorothiazide (ZESTORETIC) 10-12.5 MG tablet Take 1 tablet by mouth daily. 90 tablet 3  . Omega-3 Fatty Acids (FISH OIL) 1000 MG CPDR Take 1,000 mg by mouth daily.    . Probiotic Product (PROBIOTIC DAILY PO) Take 1 capsule by mouth daily.    Marland Kitchen SYNTHROID 125 MCG tablet TAKE 1 TABLET (125 MCG TOTAL) BY MOUTH DAILY. SYNTHROID (Patient taking differently: Take 125 mcg by mouth daily before breakfast.) 90 tablet 3    No facility-administered medications prior to visit.    Allergies  Allergen Reactions  . Egg-Derived Products Shortness Of Breath and Rash  . Sulfa Antibiotics Nausea And Vomiting  . Keflex [Cephalexin] Rash    Rash   . Penicillins Rash  . Poison [employer]  Extract [Poison Ivy Extract] Rash    Review of Systems  Constitutional:  Negative for fever and malaise/fatigue.  HENT:  Negative for congestion.   Eyes:  Negative for blurred vision.  Respiratory:  Negative for shortness of breath.   Cardiovascular:  Negative for chest pain, palpitations and leg swelling.  Gastrointestinal:  Negative for abdominal pain, blood in stool and nausea.  Genitourinary:  Negative for dysuria and frequency.  Musculoskeletal:  Negative for falls.  Skin:  Negative for rash.  Neurological:  Negative for dizziness, loss of consciousness and headaches.  Endo/Heme/Allergies:  Negative for environmental allergies.  Psychiatric/Behavioral:  Negative for depression. The patient is nervous/anxious.       Objective:    Physical Exam Constitutional:      General: She is not in acute distress.    Appearance: Normal appearance. She is well-developed. She is not toxic-appearing.  HENT:     Head: Normocephalic and atraumatic.     Right Ear: External ear normal.     Left Ear: External ear normal.     Nose: Nose normal.  Eyes:     General:        Right eye: No discharge.        Left eye: No discharge.     Conjunctiva/sclera: Conjunctivae normal.  Neck:     Thyroid: No thyromegaly.  Cardiovascular:     Rate and Rhythm: Normal rate and regular rhythm.     Heart sounds: Normal heart sounds. No murmur heard. Pulmonary:     Effort: Pulmonary effort is normal. No respiratory distress.     Breath sounds: Normal breath sounds.  Abdominal:     General: Bowel sounds are normal.     Palpations: Abdomen is soft.     Tenderness: There is no abdominal tenderness. There is no guarding.  Musculoskeletal:         General: Normal range of motion.     Cervical back: Neck supple.  Lymphadenopathy:     Cervical: No cervical adenopathy.  Skin:    General: Skin is warm and dry.  Neurological:     Mental Status: She is alert and oriented to person, place, and time.  Psychiatric:        Mood and Affect: Mood normal.        Behavior: Behavior normal.        Thought Content: Thought content normal.        Judgment: Judgment normal.   BP 132/72 (BP Location: Left Arm, Patient Position: Sitting, Cuff Size: Normal)   Pulse 78   Temp 98 F (36.7 C) (Oral)   Resp 18   Ht 5\' 2"  (1.575 m)   Wt 163 lb 3.2 oz (74 kg)   SpO2 99%   BMI 29.85 kg/m  Wt Readings from Last 3 Encounters:  06/22/23 163 lb 3.2 oz (74 kg)  03/18/23 162 lb 3.2 oz (73.6 kg)  11/05/22 167 lb 3.2 oz (75.8 kg)    Diabetic Foot Exam - Simple   No data filed    Lab Results  Component Value Date   WBC 6.2 11/05/2022   HGB 14.3 11/05/2022   HCT 42.1 11/05/2022   PLT 305.0 11/05/2022   GLUCOSE 85 11/05/2022   CHOL 158 08/24/2022   TRIG 133.0 08/24/2022   HDL 51.30 08/24/2022   LDLCALC 80 08/24/2022   ALT 12 11/05/2022   AST 14 11/05/2022   NA 139 11/05/2022   K 3.6 11/05/2022   CL 101 11/05/2022  CREATININE 0.75 11/05/2022   BUN 11 11/05/2022   CO2 28 11/05/2022   TSH 0.54 08/24/2022   INR 1.0 06/29/2019   HGBA1C 5.8 08/24/2022    Lab Results  Component Value Date   TSH 0.54 08/24/2022   Lab Results  Component Value Date   WBC 6.2 11/05/2022   HGB 14.3 11/05/2022   HCT 42.1 11/05/2022   MCV 90.5 11/05/2022   PLT 305.0 11/05/2022   Lab Results  Component Value Date   NA 139 11/05/2022   K 3.6 11/05/2022   CO2 28 11/05/2022   GLUCOSE 85 11/05/2022   BUN 11 11/05/2022   CREATININE 0.75 11/05/2022   BILITOT 0.4 11/05/2022   ALKPHOS 50 11/05/2022   AST 14 11/05/2022   ALT 12 11/05/2022   PROT 6.9 11/05/2022   ALBUMIN 4.2 11/05/2022   CALCIUM 9.5 11/05/2022   ANIONGAP 8 10/22/2022   GFR 74.50  11/05/2022   Lab Results  Component Value Date   CHOL 158 08/24/2022   Lab Results  Component Value Date   HDL 51.30 08/24/2022   Lab Results  Component Value Date   LDLCALC 80 08/24/2022   Lab Results  Component Value Date   TRIG 133.0 08/24/2022   Lab Results  Component Value Date   CHOLHDL 3 08/24/2022   Lab Results  Component Value Date   HGBA1C 5.8 08/24/2022       Assessment & Plan:  Essential hypertension Assessment & Plan: Monitor and report any concerns.no changes to meds. Encouraged heart healthy diet such as the DASH diet and exercise as tolerated.   Orders: -     CBC with Differential/Platelet -     Comprehensive metabolic panel -     TSH  Hyperglycemia Assessment & Plan: hgba1c acceptable, minimize simple carbs. Increase exercise as tolerated.   Orders: -     Hemoglobin A1c  Hyperlipidemia, unspecified hyperlipidemia type Assessment & Plan: maintain heart healthy diet, increase exercise, avoid trans fats, consider a krill oil cap daily  Orders: -     Lipid panel  Hypothyroidism, unspecified type Assessment & Plan: On Levothyroxine, continue to monitor    Vitamin D deficiency Assessment & Plan: Supplement and monitor  Orders: -     VITAMIN D 25 Hydroxy (Vit-D Deficiency, Fractures)  Osteopenia, unspecified location Assessment & Plan: Encouraged to get adequate exercise, calcium and vitamin d intake   Urinary frequency -     Urinalysis, Routine w reflex microscopic -     Urine Culture    Assessment and Plan    Leg Cramps Resolved with change in medication timing. Discussed potential benefits of magnesium glycinate supplementation and Hyland's nighttime leg cramp medicine for future recurrence. -Consider magnesium glycinate supplementation. -Consider Hyland's nighttime leg cramp medicine if cramps recur.  Dry Mouth New onset, possibly related to dehydration or mouth breathing. Discussed potential benefits of increased  hydration and use of a humidifier. -Increase hydration to 10 ounces every 1-2 hours. -Consider use of a humidifier at night. -Check urine for possible infection.  General Health Maintenance Up to date on vaccinations. Discussed potential benefits of physical therapy for strength and gait improvement. -Order blood work to check kidney function. -Consider physical therapy after the holidays.         Danise Edge, MD

## 2023-06-22 NOTE — Patient Instructions (Addendum)
Magnesium glycinate 200-400 mg at bedtime (Solaray)  Hyland's leg cramp medicine  Fluids 10 ounces every 1-2 hours. Roughly 60-80 ounces every 24 hours

## 2023-06-23 ENCOUNTER — Telehealth: Payer: Self-pay | Admitting: *Deleted

## 2023-06-23 LAB — URINALYSIS, ROUTINE W REFLEX MICROSCOPIC
Bilirubin Urine: NEGATIVE
Hgb urine dipstick: NEGATIVE
Ketones, ur: NEGATIVE
Nitrite: POSITIVE — AB
RBC / HPF: NONE SEEN (ref 0–?)
Specific Gravity, Urine: 1.02 (ref 1.000–1.030)
Total Protein, Urine: NEGATIVE
Urine Glucose: NEGATIVE
Urobilinogen, UA: 0.2 (ref 0.0–1.0)
pH: 6 (ref 5.0–8.0)

## 2023-06-23 LAB — COMPREHENSIVE METABOLIC PANEL
ALT: 9 U/L (ref 0–35)
AST: 12 U/L (ref 0–37)
Albumin: 4.1 g/dL (ref 3.5–5.2)
Alkaline Phosphatase: 63 U/L (ref 39–117)
BUN: 15 mg/dL (ref 6–23)
CO2: 31 meq/L (ref 19–32)
Calcium: 9.5 mg/dL (ref 8.4–10.5)
Chloride: 103 meq/L (ref 96–112)
Creatinine, Ser: 0.75 mg/dL (ref 0.40–1.20)
GFR: 74.18 mL/min (ref >60.00)
Glucose, Bld: 91 mg/dL (ref 70–99)
Potassium: 4.1 meq/L (ref 3.5–5.1)
Sodium: 141 meq/L (ref 135–145)
Total Bilirubin: 0.3 mg/dL (ref 0.2–1.2)
Total Protein: 6.5 g/dL (ref 6.0–8.3)

## 2023-06-23 LAB — URINE CULTURE
MICRO NUMBER:: 15860720
SPECIMEN QUALITY:: ADEQUATE

## 2023-06-23 LAB — LIPID PANEL
Cholesterol: 146 mg/dL (ref 0–200)
HDL: 45.1 mg/dL (ref >39.00)
LDL Cholesterol: 70 mg/dL (ref 0–99)
NonHDL: 100.54
Total CHOL/HDL Ratio: 3
Triglycerides: 154 mg/dL — ABNORMAL HIGH (ref 0.0–149.0)
VLDL: 30.8 mg/dL (ref 0.0–40.0)

## 2023-06-23 LAB — CBC WITH DIFFERENTIAL/PLATELET
Basophils Absolute: 0.1 10*3/uL (ref 0.0–0.1)
Basophils Relative: 1.2 % (ref 0.0–3.0)
Eosinophils Absolute: 0.2 10*3/uL (ref 0.0–0.7)
Eosinophils Relative: 4.3 % (ref 0.0–5.0)
HCT: 41.2 % (ref 36.0–46.0)
Hemoglobin: 13.8 g/dL (ref 12.0–15.0)
Lymphocytes Relative: 23.5 % (ref 12.0–46.0)
Lymphs Abs: 1.2 10*3/uL (ref 0.7–4.0)
MCHC: 33.4 g/dL (ref 30.0–36.0)
MCV: 91.5 fL (ref 78.0–100.0)
Monocytes Absolute: 0.5 10*3/uL (ref 0.1–1.0)
Monocytes Relative: 10.1 % (ref 3.0–12.0)
Neutro Abs: 3.2 10*3/uL (ref 1.4–7.7)
Neutrophils Relative %: 60.9 % (ref 43.0–77.0)
Platelets: 272 10*3/uL (ref 150.0–400.0)
RBC: 4.51 Mil/uL (ref 3.87–5.11)
RDW: 13.2 % (ref 11.5–15.5)
WBC: 5.3 10*3/uL (ref 4.0–10.5)

## 2023-06-23 LAB — HEMOGLOBIN A1C: Hgb A1c MFr Bld: 5.7 % (ref 4.6–6.5)

## 2023-06-23 LAB — VITAMIN D 25 HYDROXY (VIT D DEFICIENCY, FRACTURES): VITD: 110.08 ng/mL (ref 30.00–100.00)

## 2023-06-23 LAB — TSH: TSH: 0.05 u[IU]/mL — ABNORMAL LOW (ref 0.35–5.50)

## 2023-06-23 NOTE — Telephone Encounter (Signed)
Left message on machine to call back for patient to call back.  Pt needs to hold vitamin d supplement until Dr. Abner Greenspan advise when to recheck.

## 2023-06-23 NOTE — Telephone Encounter (Signed)
Ca WNL. Have her hold her Vit D supplement and recheck when Dr. Abner Greenspan wants her to.

## 2023-06-23 NOTE — Telephone Encounter (Signed)
CRITICAL VALUE STICKER  CRITICAL VALUE:  Vitamin D 110.08   MESSENGER (representative from lab): [id number]

## 2023-06-24 ENCOUNTER — Encounter: Payer: Self-pay | Admitting: *Deleted

## 2023-06-24 ENCOUNTER — Other Ambulatory Visit: Payer: Self-pay | Admitting: *Deleted

## 2023-06-24 DIAGNOSIS — I10 Essential (primary) hypertension: Secondary | ICD-10-CM

## 2023-06-24 DIAGNOSIS — E559 Vitamin D deficiency, unspecified: Secondary | ICD-10-CM

## 2023-06-24 DIAGNOSIS — E785 Hyperlipidemia, unspecified: Secondary | ICD-10-CM

## 2023-06-24 DIAGNOSIS — E039 Hypothyroidism, unspecified: Secondary | ICD-10-CM

## 2023-06-24 NOTE — Telephone Encounter (Signed)
See labs 

## 2023-07-27 ENCOUNTER — Other Ambulatory Visit: Payer: Self-pay | Admitting: Family Medicine

## 2023-07-27 DIAGNOSIS — I10 Essential (primary) hypertension: Secondary | ICD-10-CM

## 2023-08-03 NOTE — Telephone Encounter (Signed)
Copied from CRM [id number]. Topic: Clinical - Lab/Test Results >> Aug 03, 2023  8:49 AM Lennart Pall wrote: Reason for CRM: Patient noticed something on a recent lab and she said that no one reached out to her to go over it. If someone can call her so she can go over those results.

## 2023-10-27 ENCOUNTER — Other Ambulatory Visit: Payer: Self-pay | Admitting: Family Medicine

## 2023-12-30 ENCOUNTER — Telehealth: Payer: Self-pay | Admitting: Family Medicine

## 2023-12-30 NOTE — Telephone Encounter (Signed)
 Copied from CRM [id number]. Topic: Appointments - Transfer of Care >> Dec 30, 2023 10:03 AM Adelita E wrote: Pt is requesting to transfer FROM: Dr. Harlene Horton Pt is requesting to transfer TO: Dr. Manus Hockey Reason for requested transfer: Patient stated previous provider's hours has changed, wanting a provider that is more available. It is the responsibility of the team the patient would like to transfer to (Dr. Phillip McGowen) to reach out to the patient if for any reason this transfer is not acceptable.

## 2023-12-30 NOTE — Telephone Encounter (Signed)
 Yes okay

## 2024-01-10 ENCOUNTER — Telehealth: Payer: Self-pay

## 2024-01-10 ENCOUNTER — Other Ambulatory Visit: Payer: Self-pay | Admitting: Physician Assistant

## 2024-01-10 DIAGNOSIS — G8929 Other chronic pain: Secondary | ICD-10-CM

## 2024-01-10 NOTE — Progress Notes (Signed)
 Anesthesia Review:  PCP: Glade Horton   LVO 06/22/23  Cardiologist : none   PPM/ ICD: Device Orders: Rep Notified:  Chest x-ray : EKG : DOS- 01/18/24  Echo : Stress test: Cardiac Cath :   Activity level: can do a flight of stairs without difficutly  Sleep Study/ CPAP : none  Fasting Blood Sugar :      / Checks Blood Sugar -- times a day:    Blood Thinner/ Instructions /Last Dose: ASA / Instructions/ Last Dose :    81 mg aspirin    Preop phone appt on 01/11/24.  Med hx and preop instructions completed.  PT lives with 14 year old husband.  Daughter lives close but is currently in [place]  and will return prior to surgery.  Son lives in [place]  and will fly in on 01/13/2024.  PT has an MRI at 0800am for shoulder on 01/14/2024.  Son to come by on 01/14/2024 and pick up bag with instructons , hibiclens, benzoyl peroxide gel and ensure presurgery drink.  Son to call preop nurse at [phone] upon arrival to hospital on 01/14/2024 and nurse will deliver to car. PT is Aware to call Admitting at [phone] at end of phone call today.     Bag with insturctions, soap , ensure and benzoyl peroxide gel is located in room 6 ( Felicia Lowe's preop room ) .

## 2024-01-10 NOTE — Patient Instructions (Signed)
 SURGICAL WAITING ROOM VISITATION  Patients having surgery or a procedure may have no more than 2 support people in the waiting area - these visitors may rotate.    Children under the age of 8 must have an adult with them who is not the patient.  Visitors with respiratory illnesses are discouraged from visiting and should remain at home.  If the patient needs to stay at the hospital during part of their recovery, the visitor guidelines for inpatient rooms apply. Pre-op nurse will coordinate an appropriate time for 1 support person to accompany patient in pre-op.  This support person may not rotate.    Please refer to the [facility] website for the visitor guidelines for Inpatients (after your surgery is over and you are in a regular room).       Your procedure is scheduled on:  01/18/2024    Report to [facility] Main Entrance    Report to admitting at   1130 AM   Call this number if you have problems the morning of surgery [phone]   Do not eat food :After Midnight.   After Midnight you may have the following liquids until  1100______ AM  DAY OF SURGERY  Water Non-Citrus Juices (without pulp, NO RED-Apple, White grape, White cranberry) Black Coffee (NO MILK/CREAM OR CREAMERS, sugar ok)  Clear Tea (NO MILK/CREAM OR CREAMERS, sugar ok) regular and decaf                             Plain Jell-O (NO RED)                                           Fruit ices (not with fruit pulp, NO RED)                                     Popsicles (NO RED)                                                               Sports drinks like Gatorade (NO RED)                   The day of surgery:  Drink ONE (1) Pre-Surgery Clear Ensure or G2 at   1100AM 9 have completed by )  the morning of surgery. Drink in one sitting. Do not sip.  This drink was given to you during your hospital  pre-op appointment visit. Nothing else to drink after completing the  Pre-Surgery Clear Ensure or G2.           If you have questions, please contact your surgeon's office.       Oral Hygiene is also important to reduce your risk of infection.                                    Remember - BRUSH YOUR TEETH THE MORNING OF SURGERY WITH YOUR REGULAR TOOTHPASTE  DENTURES WILL BE REMOVED PRIOR TO SURGERY  PLEASE DO NOT APPLY Poly grip OR ADHESIVES!!!   Do NOT smoke after Midnight   Stop all vitamins and herbal supplements 7 days before surgery.   Take these medicines the morning of surgery with A SIP OF WATER:  synthroid   DO NOT TAKE ANY ORAL DIABETIC MEDICATIONS DAY OF YOUR SURGERY  Bring CPAP mask and tubing day of surgery.                              You may not have any metal on your body including hair pins, jewelry, and body piercing             Do not wear make-up, lotions, powders, perfumes/cologne, or deodorant  Do not wear nail polish including gel and S&S, artificial/acrylic nails, or any other type of covering on natural nails including finger and toenails. If you have artificial nails, gel coating, etc. that needs to be removed by a nail salon please have this removed prior to surgery or surgery may need to be canceled/ delayed if the surgeon/ anesthesia feels like they are unable to be safely monitored.   Do not shave  48 hours prior to surgery.               Men may shave face and neck.   Do not bring valuables to the hospital. [facility] IS NOT             RESPONSIBLE   FOR VALUABLES.   Contacts, glasses, dentures or bridgework may not be worn into surgery.   Bring small overnight bag day of surgery.   DO NOT BRING YOUR HOME MEDICATIONS TO THE HOSPITAL. PHARMACY WILL DISPENSE MEDICATIONS LISTED ON YOUR MEDICATION LIST TO YOU DURING YOUR ADMISSION IN THE HOSPITAL!    Patients discharged on the day of surgery will not be allowed to drive home.  Someone NEEDS to stay with you for the first 24 hours after anesthesia.   Special Instructions: Bring a copy of your  healthcare power of attorney and living will documents the day of surgery if you haven't scanned them before.              Please read over the following fact sheets you were given: IF YOU HAVE QUESTIONS ABOUT YOUR PRE-OP INSTRUCTIONS PLEASE CALL [phone]   If you received a COVID test during your pre-op visit  it is requested that you wear a mask when out in public, stay away from anyone that may not be feeling well and notify your surgeon if you develop symptoms. If you test positive for Covid or have been in contact with anyone that has tested positive in the last 10 days please notify you surgeon.      Pre-operative 5 CHG Bath Instructions   You can play a key role in reducing the risk of infection after surgery. Your skin needs to be as free of germs as possible. You can reduce the number of germs on your skin by washing with CHG (chlorhexidine gluconate) soap before surgery. CHG is an antiseptic soap that kills germs and continues to kill germs even after washing.   DO NOT use if you have an allergy  to chlorhexidine/CHG or antibacterial soaps. If your skin becomes reddened or irritated, stop using the CHG and notify one of our RNs at [phone].   Please shower with the CHG soap starting 4 days before surgery using the following schedule:  Please keep in mind the following:  DO NOT shave, including legs and underarms, starting the day of your first shower.   You may shave your face at any point before/day of surgery.  Place clean sheets on your bed the day you start using CHG soap. Use a clean washcloth (not used since being washed) for each shower. DO NOT sleep with pets once you start using the CHG.   CHG Shower Instructions:  If you choose to wash your hair and private area, wash first with your normal shampoo/soap.  After you use shampoo/soap, rinse your hair and body thoroughly to remove shampoo/soap residue.  Turn the water OFF and apply about 3 tablespoons (45 ml)  of CHG soap to a CLEAN washcloth.  Apply CHG soap ONLY FROM YOUR NECK DOWN TO YOUR TOES (washing for 3-5 minutes)  DO NOT use CHG soap on face, private areas, open wounds, or sores.  Pay special attention to the area where your surgery is being performed.  If you are having back surgery, having someone wash your back for you may be helpful. Wait 2 minutes after CHG soap is applied, then you may rinse off the CHG soap.  Pat dry with a clean towel  Put on clean clothes/pajamas   If you choose to wear lotion, please use ONLY the CHG-compatible lotions on the back of this paper.     Additional instructions for the day of surgery: DO NOT APPLY any lotions, deodorants, cologne, or perfumes.   Put on clean/comfortable clothes.  Brush your teeth.  Ask your nurse before applying any prescription medications to the skin.      CHG Compatible Lotions   Aveeno Moisturizing lotion  Cetaphil Moisturizing Cream  Cetaphil Moisturizing Lotion  Clairol Herbal Essence Moisturizing Lotion, Dry Skin  Clairol Herbal Essence Moisturizing Lotion, Extra Dry Skin  Clairol Herbal Essence Moisturizing Lotion, Normal Skin  Curel Age Defying Therapeutic Moisturizing Lotion with Alpha Hydroxy  Curel Extreme Care Body Lotion  Curel Soothing Hands Moisturizing Hand Lotion  Curel Therapeutic Moisturizing Cream, Fragrance-Free  Curel Therapeutic Moisturizing Lotion, Fragrance-Free  Curel Therapeutic Moisturizing Lotion, Original Formula  Eucerin Daily Replenishing Lotion  Eucerin Dry Skin Therapy Plus Alpha Hydroxy Crme  Eucerin Dry Skin Therapy Plus Alpha Hydroxy Lotion  Eucerin Original Crme  Eucerin Original Lotion  Eucerin Plus Crme Eucerin Plus Lotion  Eucerin TriLipid Replenishing Lotion  Keri Anti-Bacterial Hand Lotion  Keri Deep Conditioning Original Lotion Dry Skin Formula Softly Scented  Keri Deep Conditioning Original Lotion, Fragrance Free Sensitive Skin Formula  Keri Lotion Fast Absorbing  Fragrance Free Sensitive Skin Formula  Keri Lotion Fast Absorbing Softly Scented Dry Skin Formula  Keri Original Lotion  Keri Skin Renewal Lotion Keri Silky Smooth Lotion  Keri Silky Smooth Sensitive Skin Lotion  Nivea Body Creamy Conditioning Oil  Nivea Body Extra Enriched Lotion  Nivea Body Original Lotion  Nivea Body Sheer Moisturizing Lotion Nivea Crme  Nivea Skin Firming Lotion  NutraDerm 30 Skin Lotion  NutraDerm Skin Lotion  NutraDerm Therapeutic Skin Cream  NutraDerm Therapeutic Skin Lotion  ProShield Protective Hand Cream  Provon moisturizing lotion  [facility]- Preparing for Total Shoulder Arthroplasty    Before surgery, you can play an important role. Because skin is not sterile, your skin needs to be as free of germs as possible. You can reduce the number of germs on your skin by using the following products. Benzoyl Peroxide Gel Reduces the number of germs present on the skin Applied twice a  day to shoulder area starting two days before surgery    ==================================================================  Please follow these instructions carefully:  BENZOYL PEROXIDE 5% GEL  Please do not use if you have an allergy  to benzoyl peroxide.   If your skin becomes reddened/irritated stop using the benzoyl peroxide.  Starting two days before surgery, apply as follows: Apply benzoyl peroxide in the morning and at night. Apply after taking a shower. If you are not taking a shower clean entire shoulder front, back, and side along with the armpit with a clean wet washcloth.  Place a quarter-sized dollop on your shoulder and rub in thoroughly, making sure to cover the front, back, and side of your shoulder, along with the armpit.   2 days before ____ AM   ____ PM              1 day before ____ AM   ____ PM                         Do this twice a day for two days.  (Last application is the night before surgery, AFTER using the CHG soap as described below).  Do NOT  apply benzoyl peroxide gel on the day of surgery.

## 2024-01-10 NOTE — Telephone Encounter (Signed)
 Patient was advised that she may need appointment and she would like to know if the referral could be placed due to her pain and not able to drive and children stays out of state.    Copied from CRM [id number]. Topic: General - Other >> Jan 10, 2024  2:12 PM Henretta I wrote: Reason for CRM: Patient would like a call to discuss about getting home health set up. As she has hurt arm and is getting surgery next week.

## 2024-01-11 ENCOUNTER — Ambulatory Visit: Payer: Self-pay

## 2024-01-11 ENCOUNTER — Encounter (HOSPITAL_COMMUNITY): Payer: Self-pay

## 2024-01-11 ENCOUNTER — Encounter (HOSPITAL_COMMUNITY)
Admission: RE | Admit: 2024-01-11 | Discharge: 2024-01-11 | Disposition: A | Discharge status: Home / Self Care | Source: Ambulatory Visit | Attending: Orthopedic Surgery

## 2024-01-11 ENCOUNTER — Other Ambulatory Visit: Payer: Self-pay

## 2024-01-11 ENCOUNTER — Other Ambulatory Visit: Payer: Self-pay | Admitting: Family

## 2024-01-11 ENCOUNTER — Ambulatory Visit: Admitting: Family

## 2024-01-11 VITALS — Ht 62.0 in | Wt 145.0 lb

## 2024-01-11 DIAGNOSIS — M75100 Unspecified rotator cuff tear or rupture of unspecified shoulder, not specified as traumatic: Secondary | ICD-10-CM

## 2024-01-11 DIAGNOSIS — Z01818 Encounter for other preprocedural examination: Secondary | ICD-10-CM

## 2024-01-11 DIAGNOSIS — Z5941 Food insecurity: Secondary | ICD-10-CM

## 2024-01-11 HISTORY — DX: Personal history of urinary calculi: Z87.442

## 2024-01-11 HISTORY — DX: Hypothyroidism, unspecified: E03.9

## 2024-01-11 HISTORY — DX: Other complications of anesthesia, initial encounter: T88.59XA

## 2024-01-11 HISTORY — DX: Other specified postprocedural states: Z98.890

## 2024-01-11 NOTE — Telephone Encounter (Signed)
 Copied from CRM [id number]. Topic: General - Other >> Jan 10, 2024 11:55 AM Rosina BIRCH wrote: Reason for CRM: patient fell last night and she went to a physician (patient did not know where she went because her neighbor who is a doctor took her somewhere) and they going to do shoulder replacement on next Tuesday CB [phone]

## 2024-01-11 NOTE — Telephone Encounter (Signed)
 Spoke with pt. Appt made with Leita Elbe for today at 2pm for video visit. Also reached out to [place] at  [phone] [employer] and he will contact pt to see what resources they can get lined up for her and spouse.

## 2024-01-11 NOTE — Telephone Encounter (Signed)
 VBCI referral placed by NP as emergent. I reconnected with pt to advise her of referral status and I feel that will be quickest resource for getting the assistance she needs. Also recommended that she contact surgeon to see if they will be placing any type of referral for help post surgery.  A neighbor was able to bring them a meal since we spoke and [employer] wheels reached out to her and gave her some other sources to contact as she did not qualify through them. Pt felt relieved knowing processes have begun and was in agreement to cancel video visit that was made for this afternoon.

## 2024-01-11 NOTE — Telephone Encounter (Signed)
 Also reviewed Providers response from yesterday.  Notified pt of all recommendations and scheduled her a virtual visit with Felicia Lowe at 2pm.  Pt tells me that she hasn't been able to prepare food for her husband for 2 days.  I connected with [place] at [employer] and he will reach out to her to see if they can get some type of resource lined up for her.

## 2024-01-11 NOTE — Telephone Encounter (Signed)
 FYI Only or Action Required?: Action required by provider: request for appointment.  Patient was last seen in primary care on 06/22/2023 by Domenica Harlene LABOR, MD.  Called Nurse Triage reporting No chief complaint on file..  Symptoms began several days ago.  Interventions attempted: Nothing.  Symptoms are: gradually worsening.  Triage Disposition: See PCP When Office is Open (Within 3 Days)  Patient/caregiver understands and will follow disposition?: No   Copied from CRM [id number]. Topic: Clinical - Red Word Triage >> Jan 11, 2024  7:57 AM Mesmerise C wrote: [facility] that prompted transfer to Nurse Triage: Patient wanting Home Health aid she has fallen and experiencing some pain she's looking for nursing and personal care by any chances inquirng if Dr. Carleton can recommend some companies for her Reason for Disposition  MILD weakness (i.e., does not interfere with ability to work, go to school, normal activities)  (Exception: Mild weakness is a chronic symptom.)  Answer Assessment - Initial Assessment Questions 1. MECHANISM: How did the fall happen?     Tripped, unsure of exact reason  2. DOMESTIC VIOLENCE AND ELDER ABUSE SCREENING: Did you fall because someone pushed you or tried to hurt you? If Yes, ask: Are you safe now?     Denies  3. ONSET: When did the fall happen? (e.g., minutes, hours, or days ago)     Sunday Night  4. LOCATION: What part of the body hit the ground? (e.g., back, buttocks, head, hips, knees, hands, head, stomach)     Shoulder  5. INJURY: Did you hurt (injure) yourself when you fell? If Yes, ask: What did you injure? Tell me more about this? (e.g., body area; type of injury; pain severity)     Shoulder  6. PAIN: Is there any pain? If Yes, ask: How bad is the pain? (e.g., Scale 1-10; or mild,  moderate, severe)   - NONE (0): No pain   - MILD (1-3): Doesn't interfere with normal activities    - MODERATE (4-7): Interferes with normal  activities or awakens from sleep    - SEVERE (8-10): Excruciating pain, unable to do any normal activities      Moderate to Severe  7. SIZE: For cuts, bruises, or swelling, ask: How large is it? (e.g., inches or centimeters)      No  8. PREGNANCY: Is there any chance you are pregnant? When was your last menstrual period?     No and No  9. OTHER SYMPTOMS: Do you have any other symptoms? (e.g., dizziness, fever, weakness; new onset or worsening).      Loss of Appetite  10. CAUSE: What do you think caused the fall (or falling)? (e.g., tripped, dizzy spell)       Dizzy spell   Patient is calling to get herself and her husband some assistance in regards to getting home health as soon as possible. Requested same day appointment. Stated they were both unable to drive, so offered the patient virtual appointments with providers in office today. The patient stated she was going to call around and inquire about other providers privately despite the earlier insisted urgency of needing to get home health or help today.  Protocols used: Falls and [facility]

## 2024-01-13 ENCOUNTER — Ambulatory Visit: Payer: Self-pay

## 2024-01-13 ENCOUNTER — Other Ambulatory Visit: Payer: Self-pay

## 2024-01-13 NOTE — Telephone Encounter (Signed)
 Spoke with patient and she stated since no one called her back on yesterday, 01/12/24 she received information about a private home health company and they will be out Sunday, 01/16/24 to handle everything for her. She said no referral is needed per her insurance company. Just an FYI

## 2024-01-13 NOTE — Patient Instructions (Signed)
 Visit Information  Thank you for taking time to visit with me today. Please don't hesitate to contact me if I can be of assistance to you before our next scheduled appointment.  Our next appointment is no further scheduled appointments.  on  at  Please call the care guide team at [phone] if you need to cancel or reschedule your appointment.   Following is a copy of your care plan:   Goals Addressed             This Visit's Progress    VBCI RN Care Plan       Problems:  Care Coordination needs related to Food Insecurity  and post operative care at home  Goal: Over the next 30 days the Patient will attend all scheduled medical appointments: 01/18/24 as evidenced by chart review         Interventions:   Evaluation of current treatment plan related to reverse total shoulder surgery, Limited access to food and Level of care concerns self-management and patient's adherence to plan as established by provider. Discussed plans with patient for ongoing care management follow up and provided patient with direct contact information for care management team Collaborated with patient regarding family support after surgery  Patient Self-Care Activities:  Attend all scheduled provider appointments Call provider office for new concerns or questions  Perform all self care activities independently  Take medications as prescribed    Plan:  No further follow up required: patient does not meet eligibility requirements for [facility] program. Patient reported having her daughter order [employer] for meals temporarily, her daughter returns from vacation in a few days. Patient also reported that her son is coming to stay with them for a few days until daughters return.  RNCM did suggest that patient contact her insurance company to inquire about possible benefit for bath aide, etc and that if it is covered, I would be happy to submit a request for orders.  Patient was given contact information for  future needs.               Please call the Suicide and Crisis Lifeline: 988 call the [place]  National Suicide Prevention Lifeline: 510-120-9140 or TTY: 603-512-8449 TTY (331)112-2288) to talk to a trained counselor call 1-800-273-TALK (toll free, 24 hour hotline) if you are experiencing a Mental Health or Behavioral Health Crisis or need someone to talk to.  Patient verbalizes understanding of instructions and care plan provided today and agrees to view in MyChart. Active MyChart status and patient understanding of how to access instructions and care plan via MyChart confirmed with patient.     SIGNATURE  Olam Idol BSN RN CCM [place]  [facility], [facility] RN Care Manager Direct Dial: [phone] Fax: [phone]

## 2024-01-13 NOTE — Telephone Encounter (Signed)
 FYI Only or Action Required?: Action required by provider: clinical question for provider.  Patient was last seen in primary care on 06/22/2023 by Domenica Harlene LABOR, MD.  Called Nurse Triage reporting Fall.  Symptoms began today.  Interventions attempted: Nothing.  Symptoms are: stable.  Triage Disposition: See Physician Within 24 Hours See note, d/t pt recent fall. Unable to come to appt. Please advise.   Patient/caregiver understands and will follow disposition?: Yes       Copied from CRM [id number]. Topic: Clinical - Red Word Triage >> Jan 13, 2024 10:16 AM Jayma L wrote: Red Word that prompted transfer to Nurse Triage: patient fell in hall on Sunday and said there is some pain where she fell, and some swelling. Not getting better and she thinks she broke her nose. Asking for something to help with the pain. Said she also is getting a UTI Reason for Disposition  Bad or foul-smelling urine  Answer Assessment - Initial Assessment Questions 1. SYMPTOM: What's the main symptom you're concerned about? (e.g., frequency, incontinence)     -------------------Chills, dark urine and odor    2. ONSET: When did the   start?     ----------------- two days ago   3. PAIN: Is there any pain? If Yes, ask: How bad is it? (Scale: 1-10; mild, moderate, severe)     ------------------  Denies  4. CAUSE: What do you think is causing the symptoms?     ------------ Unsure   5. OTHER SYMPTOMS: Do you have any other symptoms? (e.g., blood in urine, fever, flank pain, pain with urination)     -----------------Denies    6. PREGNANCY: Is there any chance you are pregnant? When was your last menstrual period?     ------Denies    Additional Information: Pt confirms this phone call is in regards to her UTI s/s. Not her recent fall.   Attempted to schedule patient, for her to leave a urine sample. Patient declined and noted that d/t her recent fall she is unable to make it to an  appointment.   Message routed to Office, please advise.  Answer Assessment - Initial Assessment Questions 1. MECHANISM: How did the fall happen?     --------- Walking normally and tripped on something   2. DOMESTIC VIOLENCE AND ELDER ABUSE SCREENING: Did you fall because someone pushed you or tried to hurt you? If Yes, ask: Are you safe now?     -----------------------Denies    3. ONSET: When did the fall happen? (e.g., minutes, hours, or days ago)      ------------------Sunday   4. LOCATION: What part of the body hit the ground? (e.g., back, buttocks, head, hips, knees, hands, head, stomach)     --------------------------- Head, shoulder    5. INJURY: Did you hurt (injure) yourself when you fell? If Yes, ask: What did you injure? Tell me more about this? (e.g., body area; type of injury; pain severity)     -------------------------------- Head and nose    6. PAIN: Is there any pain? If Yes, ask: How bad is the pain? (e.g., Scale 0-10; or none, mild,      *No Answer*   7. SIZE: For cuts, bruises, or swelling, ask: How large is it? (e.g., inches or centimeters)      *No Answer*   9. OTHER SYMPTOMS: Do you have any other symptoms? (e.g., dizziness, fever, weakness; new-onset or worsening).      *No Answer*  10. CAUSE: What do you think caused the fall (  or falling)? (e.g., dizzy spell, tripped)       ---Unsure    Additional Information: Was seen Monday. Rotator Cuff is injured and was given tramadol for the pain  Protocols used: Falls and Falling-A-AH, Urinary Symptoms-A-AH

## 2024-01-14 ENCOUNTER — Ambulatory Visit
Admission: RE | Admit: 2024-01-14 | Discharge: 2024-01-14 | Disposition: A | Discharge status: Home / Self Care | Source: Ambulatory Visit | Attending: Physician Assistant | Admitting: Physician Assistant

## 2024-01-14 DIAGNOSIS — G8929 Other chronic pain: Secondary | ICD-10-CM

## 2024-01-14 NOTE — Progress Notes (Addendum)
 Called pt at home on 01/14/24 at 1455pm when son had not yet picked up supplies and instructions for surgery. PT had forgotten and will send son to [facility] prior to 1630 today to pick up preop inswtrucdtions along with supplies.     Son and husband arrived on 01/14/24 at 1545 to pick up preop instrucitons, hibiclens , ensurepresurgery drink and benzoyl peroxide gel.

## 2024-01-17 ENCOUNTER — Encounter (HOSPITAL_COMMUNITY): Payer: Self-pay

## 2024-01-17 NOTE — Anesthesia Preprocedure Evaluation (Signed)
 Anesthesia Evaluation  Patient identified by MRN, date of birth, ID band Patient awake    Reviewed: Allergy  & Precautions, NPO status , Patient's Chart, lab work & pertinent test results  History of Anesthesia Complications (+) PONV and history of anesthetic complications  Airway Mallampati: II  TM Distance: >3 FB Neck ROM: Full    Dental  (+) Dental Advisory Given, Partial Upper, Partial Lower   Pulmonary neg pulmonary ROS   Pulmonary exam normal breath sounds clear to auscultation       Cardiovascular hypertension, Pt. on medications (-) angina (-) CAD, (-) Past MI and (-) Cardiac Stents Normal cardiovascular exam Rhythm:Regular Rate:Normal     Neuro/Psych negative neurological ROS  negative psych ROS   GI/Hepatic Neg liver ROS,GERD  ,,  Endo/Other  Hypothyroidism    Renal/GU negative Renal ROS     Musculoskeletal  (+) Arthritis ,    Abdominal   Peds  Hematology negative hematology ROS (+)   Anesthesia Other Findings Day of surgery medications reviewed with the patient.  Reproductive/Obstetrics                              Anesthesia Physical Anesthesia Plan  ASA: 3  Anesthesia Plan: General   Post-op Pain Management: Regional block* and Tylenol  PO (pre-op)*   Induction: Intravenous  PONV Risk Score and Plan: 4 or greater and Dexamethasone  and Ondansetron   Airway Management Planned: Oral ETT  Additional Equipment:   Intra-op Plan:   Post-operative Plan: Extubation in OR  Informed Consent: I have reviewed the patients History and Physical, chart, labs and discussed the procedure including the risks, benefits and alternatives for the proposed anesthesia with the patient or authorized representative who has indicated his/her understanding and acceptance.     Dental advisory given  Plan Discussed with: CRNA  Anesthesia Plan Comments: (PMH of HTN, GERD, hypothyroid,  arthritis, falls. Complications of anesthesia: PONV, hypotension with certain meds (patient unsure of which ones). Labs and EKG for DOS  )         Anesthesia Quick Evaluation

## 2024-01-17 NOTE — H&P (Signed)
 PREOPERATIVE H&P  Chief Complaint: right shoulder pain  HPI: Felicia Lowe is a 83 y.o. female  here for evaluation of acute-on-chronic right shoulder pain. She had a fall while at home last night, 07/06, with acute onset of severe right shoulder pain. She likes to garden and has had right shoulder pain that has been persistent with difficulty lifting on an ongoing basis, but not to the severity that she currently has.  Past history for a thyroid  condition and hypertension. She normally takes anti-inflammatories for her arthritic pains.  Past Medical History:  Diagnosis Date   Allergy     Arthritis    osteoarthritis, severe, b/l knees &b/l hips  replaced   BPV (benign positional vertigo) 02/19/2013   mild   Chicken pox as a child   Complication of anesthesia    B/P RUNS LOW DURING SURGERY   GERD (gastroesophageal reflux disease) 05/27/2014   H/O breast biopsy    benign, on left   History of kidney stones    Hypertension    Hypothyroidism    Kidney stone 2000   nephritis as a child   Measles as a child   Mumps as a child   Obesity    Osteoporosis    PONV (postoperative nausea and vomiting)    Scabies    Skin lesion of face 05/18/2012   Unsteady gait 11/25/2014   Vitamin D  deficiency    Past Surgical History:  Procedure Laterality Date   ABDOMINAL HYSTERECTOMY     CATARACT EXTRACTION Left 2017   EYE SURGERY Left 2019   cataract removal left   HIP SURGERY     both hips   JOINT REPLACEMENT     KNEE SURGERY     both knees replaced   LITHOTRIPSY     For kidney stones   Social History   Socioeconomic History   Marital status: Married    Spouse name: Not on file   Number of children: Not on file   Years of education: Not on file   Highest education level: Not on file  Occupational History   Occupation: Retired Warden/ranger  Tobacco Use   Smoking status: Never   Smokeless tobacco: Never  Vaping Use   Vaping status: Never Used  Substance and Sexual Activity    Alcohol use: Not Currently   Drug use: No   Sexual activity: Never  Other Topics Concern   Not on file  Social History Narrative   Not on file   Social Drivers of Health   Financial Resource Strain: Low Risk  (03/18/2023)   Overall Financial Resource Strain (CARDIA)    Difficulty of Paying Living Expenses: Not hard at all  Food Insecurity: No Food Insecurity (10/20/2022)   Hunger Vital Sign    Worried About Radiation protection practitioner of Food in the Last Year: Never true    Ran Out of Food in the Last Year: Never true  Transportation Needs: No Transportation Needs (10/20/2022)   PRAPARE - Administrator, Civil Service (Medical): No    Lack of Transportation (Non-Medical): No  Physical Activity: Inactive (03/18/2023)   Exercise Vital Sign    Days of Exercise per Week: 0 days    Minutes of Exercise per Session: 0 min  Stress: No Stress Concern Present (03/18/2023)   [employer] of Occupational Health - Occupational Stress Questionnaire    Feeling of Stress : Not at all  Social Connections: Socially Integrated (03/18/2023)   Social Connection and Isolation Panel  Frequency of Communication with Friends and Family: More than three times a week    Frequency of Social Gatherings with Friends and Family: Twice a week    Attends Religious Services: More than 4 times per year    Active Member of [employer] or Organizations: Yes    Attends Engineer, structural: More than 4 times per year    Marital Status: Married   Family History  Problem Relation Age of Onset   Hyperlipidemia Mother    Hypertension Mother    Heart attack Mother    Other Mother        goiter problems   Cancer Father        lung- smoker   Heart attack Father        X 2   Thyroid  disease Brother    Hypertension Son    Rashes / Skin problems Son    Hernia Son        umbilical   Cancer Paternal Grandmother    Macular degeneration Brother    Breast cancer Maternal Aunt    Colon cancer Neg Hx    Esophageal  cancer Neg Hx    Rectal cancer Neg Hx    Stomach cancer Neg Hx    Allergies  Allergen Reactions   Egg-Derived Products Shortness Of Breath and Rash   Sulfa Antibiotics Nausea And Vomiting   Keflex  [Cephalexin ] Rash        Penicillins Rash   Poison Ivy Extract [Poison Ivy Extract] Rash   Prior to Admission medications   Medication Sig Start Date End Date Taking? Authorizing Provider  lisinopril -hydrochlorothiazide  (ZESTORETIC ) 10-12.5 MG tablet TAKE 1 TABLET BY MOUTH EVERY DAY 07/27/23  Yes Domenica Harlene LABOR, MD  SYNTHROID  125 MCG tablet TAKE 1 TABLET (125 MCG TOTAL) BY MOUTH DAILY. SYNTHROID  10/27/23  Yes Domenica Harlene LABOR, MD  traMADol  (ULTRAM ) 50 MG tablet Take 50 mg by mouth in the morning, at noon, and at bedtime. 01/10/24  Yes [provider]  Ascorbic Acid (VITAMIN C) 1000 MG tablet Take 1,000 mg by mouth daily.    [provider]  aspirin  81 MG tablet Take 81 mg by mouth daily.    [provider]  Cholecalciferol (VITAMIN D ) 2000 UNITS tablet Take 2,000 Units by mouth daily.    [provider]  Coenzyme Q10 (CO Q 10) 100 MG CAPS Take 100 mg by mouth daily.    [provider]  Omega-3 Fatty Acids (FISH OIL) 1000 MG CPDR Take 1,000 mg by mouth daily.    [provider]  Probiotic Product (PROBIOTIC DAILY PO) Take 1 capsule by mouth daily.    [provider]     Positive ROS: All other systems have been reviewed and were otherwise negative with the exception of those mentioned in the HPI and as above.  Physical Exam: General: Alert, no acute distress Cardiovascular: No pedal edema Respiratory: No cyanosis, no use of accessory musculature GI: No organomegaly, abdomen is soft and non-tender Skin: No lesions in the area of chief complaint Neurologic: Sensation intact distally Psychiatric: Patient is competent for consent with normal mood and affect Lymphatic: No axillary or cervical lymphadenopathy  MUSCULOSKELETAL: On  exam, the right shoulder has sensation intact throughout. All fingers flex, extend and abduct. She cannot really lift her arm much at all.  Two views of the right shoulder were ordered and interpreted by me demonstrate a proximal humerus fracture of the humeral head with a fair amount of displacement  and it looks like she may have had some chronic rotator cuff disease.  Assessment: Right proximal humerus fracture with coexisting rotator cuff pathology.   Plan: Plan for Procedure(s): ARTHROPLASTY, SHOULDER, TOTAL, REVERSE  The risks benefits and alternatives were discussed with the patient including but not limited to the risks of nonoperative treatment, versus surgical intervention including infection, bleeding, nerve injury,  blood clots, cardiopulmonary complications, morbidity, mortality, among others, and they were willing to proceed.    Kyarra Vancamp K Robley Matassa, PA-C    01/17/2024 1:53 PM

## 2024-01-18 ENCOUNTER — Ambulatory Visit (HOSPITAL_COMMUNITY): Payer: Self-pay | Admitting: Physician Assistant

## 2024-01-18 ENCOUNTER — Ambulatory Visit (HOSPITAL_COMMUNITY)

## 2024-01-18 ENCOUNTER — Other Ambulatory Visit: Payer: Self-pay

## 2024-01-18 ENCOUNTER — Inpatient Hospital Stay (HOSPITAL_COMMUNITY)
Admission: RE | Admit: 2024-01-18 | Discharge: 2024-01-24 | DRG: 483 | Disposition: A | Discharge status: Home Health | Attending: Orthopedic Surgery | Admitting: Orthopedic Surgery

## 2024-01-18 ENCOUNTER — Encounter (HOSPITAL_COMMUNITY)
Admission: RE | Disposition: A | Discharge status: Home Health | Payer: Self-pay | Source: Home / Self Care | Attending: Orthopedic Surgery

## 2024-01-18 ENCOUNTER — Encounter (HOSPITAL_COMMUNITY): Payer: Self-pay | Admitting: Orthopedic Surgery

## 2024-01-18 DIAGNOSIS — L89312 Pressure ulcer of right buttock, stage 2: Secondary | ICD-10-CM | POA: Diagnosis present

## 2024-01-18 DIAGNOSIS — S42201A Unspecified fracture of upper end of right humerus, initial encounter for closed fracture: Secondary | ICD-10-CM

## 2024-01-18 DIAGNOSIS — Z6826 Body mass index (BMI) 26.0-26.9, adult: Secondary | ICD-10-CM

## 2024-01-18 DIAGNOSIS — K219 Gastro-esophageal reflux disease without esophagitis: Secondary | ICD-10-CM | POA: Diagnosis present

## 2024-01-18 DIAGNOSIS — Z881 Allergy status to other antibiotic agents status: Secondary | ICD-10-CM

## 2024-01-18 DIAGNOSIS — M75101 Unspecified rotator cuff tear or rupture of right shoulder, not specified as traumatic: Secondary | ICD-10-CM | POA: Diagnosis present

## 2024-01-18 DIAGNOSIS — W19XXXA Unspecified fall, initial encounter: Secondary | ICD-10-CM | POA: Diagnosis present

## 2024-01-18 DIAGNOSIS — Z8249 Family history of ischemic heart disease and other diseases of the circulatory system: Secondary | ICD-10-CM

## 2024-01-18 DIAGNOSIS — G8929 Other chronic pain: Secondary | ICD-10-CM | POA: Diagnosis present

## 2024-01-18 DIAGNOSIS — Z803 Family history of malignant neoplasm of breast: Secondary | ICD-10-CM

## 2024-01-18 DIAGNOSIS — S4291XA Fracture of right shoulder girdle, part unspecified, initial encounter for closed fracture: Secondary | ICD-10-CM | POA: Diagnosis present

## 2024-01-18 DIAGNOSIS — L89322 Pressure ulcer of left buttock, stage 2: Secondary | ICD-10-CM | POA: Diagnosis present

## 2024-01-18 DIAGNOSIS — Z01818 Encounter for other preprocedural examination: Secondary | ICD-10-CM

## 2024-01-18 DIAGNOSIS — Z8349 Family history of other endocrine, nutritional and metabolic diseases: Secondary | ICD-10-CM

## 2024-01-18 DIAGNOSIS — Z87442 Personal history of urinary calculi: Secondary | ICD-10-CM

## 2024-01-18 DIAGNOSIS — E039 Hypothyroidism, unspecified: Secondary | ICD-10-CM

## 2024-01-18 DIAGNOSIS — Z9181 History of falling: Secondary | ICD-10-CM

## 2024-01-18 DIAGNOSIS — I10 Essential (primary) hypertension: Secondary | ICD-10-CM | POA: Diagnosis not present

## 2024-01-18 DIAGNOSIS — E785 Hyperlipidemia, unspecified: Secondary | ICD-10-CM | POA: Diagnosis not present

## 2024-01-18 DIAGNOSIS — Y92019 Unspecified place in single-family (private) house as the place of occurrence of the external cause: Secondary | ICD-10-CM

## 2024-01-18 DIAGNOSIS — S42241A 4-part fracture of surgical neck of right humerus, initial encounter for closed fracture: Principal | ICD-10-CM | POA: Diagnosis present

## 2024-01-18 DIAGNOSIS — Z96643 Presence of artificial hip joint, bilateral: Secondary | ICD-10-CM | POA: Diagnosis present

## 2024-01-18 DIAGNOSIS — L8990 Pressure ulcer of unspecified site, unspecified stage: Secondary | ICD-10-CM | POA: Diagnosis present

## 2024-01-18 DIAGNOSIS — E559 Vitamin D deficiency, unspecified: Secondary | ICD-10-CM | POA: Diagnosis present

## 2024-01-18 DIAGNOSIS — Z882 Allergy status to sulfonamides status: Secondary | ICD-10-CM

## 2024-01-18 DIAGNOSIS — Z79899 Other long term (current) drug therapy: Secondary | ICD-10-CM

## 2024-01-18 DIAGNOSIS — Z7982 Long term (current) use of aspirin: Secondary | ICD-10-CM

## 2024-01-18 DIAGNOSIS — Z96611 Presence of right artificial shoulder joint: Principal | ICD-10-CM

## 2024-01-18 DIAGNOSIS — Z83438 Family history of other disorder of lipoprotein metabolism and other lipidemia: Secondary | ICD-10-CM

## 2024-01-18 DIAGNOSIS — Z90710 Acquired absence of both cervix and uterus: Secondary | ICD-10-CM

## 2024-01-18 DIAGNOSIS — Z880 Allergy status to penicillin: Secondary | ICD-10-CM

## 2024-01-18 DIAGNOSIS — M810 Age-related osteoporosis without current pathological fracture: Secondary | ICD-10-CM | POA: Diagnosis present

## 2024-01-18 DIAGNOSIS — E669 Obesity, unspecified: Secondary | ICD-10-CM | POA: Diagnosis present

## 2024-01-18 DIAGNOSIS — Z79890 Hormone replacement therapy (postmenopausal): Secondary | ICD-10-CM

## 2024-01-18 HISTORY — PX: REVERSE SHOULDER ARTHROPLASTY: SHX5054

## 2024-01-18 LAB — BASIC METABOLIC PANEL WITH GFR
Anion gap: 13 (ref 5–15)
BUN: 9 mg/dL (ref 8–23)
CO2: 26 mmol/L (ref 22–32)
Calcium: 9.1 mg/dL (ref 8.9–10.3)
Chloride: 98 mmol/L (ref 98–111)
Creatinine, Ser: 0.6 mg/dL (ref 0.44–1.00)
GFR, Estimated: >60 mL/min (ref >60)
Glucose, Bld: 74 mg/dL (ref 70–99)
Potassium: 3.2 mmol/L — ABNORMAL LOW (ref 3.5–5.1)
Sodium: 137 mmol/L (ref 135–145)

## 2024-01-18 LAB — SURGICAL PCR SCREEN
MRSA, PCR: NEGATIVE
Staphylococcus aureus: NEGATIVE

## 2024-01-18 LAB — CBC
HCT: 39.4 % (ref 36.0–46.0)
Hemoglobin: 13.3 g/dL (ref 12.0–15.0)
MCH: 30.3 pg (ref 26.0–34.0)
MCHC: 33.8 g/dL (ref 30.0–36.0)
MCV: 89.7 fL (ref 80.0–100.0)
Platelets: 349 K/uL (ref 150–400)
RBC: 4.39 MIL/uL (ref 3.87–5.11)
RDW: 12.7 % (ref 11.5–15.5)
WBC: 6.8 K/uL (ref 4.0–10.5)
nRBC: 0 % (ref 0.0–0.2)

## 2024-01-18 SURGERY — ARTHROPLASTY, SHOULDER, TOTAL, REVERSE
Anesthesia: General | Site: Shoulder | Laterality: Right

## 2024-01-18 MED ORDER — ACETAMINOPHEN 500 MG PO TABS
500.0000 mg | ORAL_TABLET | Freq: Four times a day (QID) | ORAL | Status: AC
  Administered 2024-01-18 – 2024-01-19 (×3): 500 mg via ORAL
  Filled 2024-01-18 (×3): qty 1

## 2024-01-18 MED ORDER — 0.9 % SODIUM CHLORIDE (POUR BTL) OPTIME
TOPICAL | Status: DC | PRN
  Administered 2024-01-18: 1000 mL

## 2024-01-18 MED ORDER — EPHEDRINE SULFATE-NACL 50-0.9 MG/10ML-% IV SOSY
PREFILLED_SYRINGE | INTRAVENOUS | Status: DC | PRN
Start: 2024-01-18 — End: 2024-01-18
  Administered 2024-01-18 (×2): 5 mg via INTRAVENOUS

## 2024-01-18 MED ORDER — ASPIRIN 81 MG PO TBEC
81.0000 mg | DELAYED_RELEASE_TABLET | Freq: Every day | ORAL | Status: DC
  Administered 2024-01-19: 81 mg via ORAL
  Filled 2024-01-18: qty 1

## 2024-01-18 MED ORDER — ONDANSETRON HCL 4 MG/2ML IJ SOLN
INTRAMUSCULAR | Status: DC | PRN
Start: 2024-01-18 — End: 2024-01-18
  Administered 2024-01-18: 4 mg via INTRAVENOUS

## 2024-01-18 MED ORDER — LACTATED RINGERS IV SOLN: INTRAVENOUS | Status: DC

## 2024-01-18 MED ORDER — ALUM & MAG HYDROXIDE-SIMETH 200-200-20 MG/5ML PO SUSP
30.0000 mL | ORAL | Status: DC | PRN
Start: 2024-01-18 — End: 2024-01-24

## 2024-01-18 MED ORDER — TRANEXAMIC ACID-NACL 1000-0.7 MG/100ML-% IV SOLN
1000.0000 mg | Freq: Once | INTRAVENOUS | Status: AC
  Administered 2024-01-18: 1000 mg via INTRAVENOUS
  Filled 2024-01-18: qty 100

## 2024-01-18 MED ORDER — CEFAZOLIN SODIUM-DEXTROSE 2-4 GM/100ML-% IV SOLN
2.0000 g | INTRAVENOUS | Status: AC
  Administered 2024-01-18: 2 g via INTRAVENOUS
  Filled 2024-01-18: qty 100

## 2024-01-18 MED ORDER — METOCLOPRAMIDE HCL 5 MG/ML IJ SOLN: 5.0000 mg | Freq: Three times a day (TID) | INTRAMUSCULAR | Status: DC | PRN

## 2024-01-18 MED ORDER — MENTHOL 3 MG MT LOZG: 1.0000 | LOZENGE | OROMUCOSAL | Status: DC | PRN

## 2024-01-18 MED ORDER — DEXAMETHASONE SODIUM PHOSPHATE 10 MG/ML IJ SOLN
INTRAMUSCULAR | Status: AC
  Filled 2024-01-18: qty 1

## 2024-01-18 MED ORDER — LIDOCAINE HCL (PF) 2 % IJ SOLN
INTRAMUSCULAR | Status: AC
Start: 2024-01-18 — End: 2024-01-18
  Filled 2024-01-18: qty 5

## 2024-01-18 MED ORDER — BISACODYL 10 MG RE SUPP: 10.0000 mg | Freq: Every day | RECTAL | Status: DC | PRN

## 2024-01-18 MED ORDER — ROCURONIUM BROMIDE 10 MG/ML (PF) SYRINGE
PREFILLED_SYRINGE | INTRAVENOUS | Status: AC
Start: 2024-01-18 — End: 2024-01-18
  Filled 2024-01-18: qty 10

## 2024-01-18 MED ORDER — BUPIVACAINE HCL (PF) 0.5 % IJ SOLN
INTRAMUSCULAR | Status: DC | PRN
  Administered 2024-01-18: 10 mL via PERINEURAL

## 2024-01-18 MED ORDER — TRAMADOL HCL 50 MG PO TABS
50.0000 mg | ORAL_TABLET | Freq: Four times a day (QID) | ORAL | Status: DC | PRN
  Administered 2024-01-19 (×2): 50 mg via ORAL
  Filled 2024-01-18 (×2): qty 1

## 2024-01-18 MED ORDER — METOCLOPRAMIDE HCL 5 MG PO TABS: 5.0000 mg | ORAL_TABLET | Freq: Three times a day (TID) | ORAL | Status: DC | PRN

## 2024-01-18 MED ORDER — POVIDONE-IODINE 10 % EX SWAB: 2.0000 | Freq: Once | CUTANEOUS | Status: DC

## 2024-01-18 MED ORDER — ROCURONIUM BROMIDE 10 MG/ML (PF) SYRINGE
PREFILLED_SYRINGE | INTRAVENOUS | Status: DC | PRN
  Administered 2024-01-18: 50 mg via INTRAVENOUS
  Administered 2024-01-18: 10 mg via INTRAVENOUS

## 2024-01-18 MED ORDER — FENTANYL CITRATE PF 50 MCG/ML IJ SOSY
100.0000 ug | PREFILLED_SYRINGE | INTRAMUSCULAR | Status: DC
  Administered 2024-01-18: 50 ug via INTRAVENOUS

## 2024-01-18 MED ORDER — HYDROCODONE-ACETAMINOPHEN 5-325 MG PO TABS: 1.0000 | ORAL_TABLET | Freq: Four times a day (QID) | ORAL | Status: DC | PRN

## 2024-01-18 MED ORDER — FENTANYL CITRATE PF 50 MCG/ML IJ SOSY
PREFILLED_SYRINGE | INTRAMUSCULAR | Status: AC
  Filled 2024-01-18: qty 2

## 2024-01-18 MED ORDER — ACETAMINOPHEN 500 MG PO TABS
1000.0000 mg | ORAL_TABLET | Freq: Once | ORAL | Status: AC
  Administered 2024-01-18: 1000 mg via ORAL
  Filled 2024-01-18: qty 2

## 2024-01-18 MED ORDER — TRANEXAMIC ACID-NACL 1000-0.7 MG/100ML-% IV SOLN
1000.0000 mg | INTRAVENOUS | Status: AC
  Administered 2024-01-18: 1000 mg via INTRAVENOUS
  Filled 2024-01-18: qty 100

## 2024-01-18 MED ORDER — CEFAZOLIN SODIUM-DEXTROSE 2-4 GM/100ML-% IV SOLN
2.0000 g | Freq: Four times a day (QID) | INTRAVENOUS | Status: AC
  Administered 2024-01-18 – 2024-01-19 (×2): 2 g via INTRAVENOUS
  Filled 2024-01-18 (×2): qty 100

## 2024-01-18 MED ORDER — MORPHINE SULFATE (PF) 2 MG/ML IV SOLN: 0.5000 mg | INTRAVENOUS | Status: DC | PRN

## 2024-01-18 MED ORDER — FENTANYL CITRATE (PF) 100 MCG/2ML IJ SOLN
INTRAMUSCULAR | Status: AC
  Filled 2024-01-18: qty 2

## 2024-01-18 MED ORDER — ONDANSETRON HCL 4 MG/2ML IJ SOLN: 4.0000 mg | Freq: Once | INTRAMUSCULAR | Status: DC | PRN

## 2024-01-18 MED ORDER — ACETAMINOPHEN 325 MG PO TABS
325.0000 mg | ORAL_TABLET | Freq: Four times a day (QID) | ORAL | Status: DC | PRN
  Administered 2024-01-20: 650 mg via ORAL
  Administered 2024-01-20 – 2024-01-21 (×3): 325 mg via ORAL
  Filled 2024-01-18 (×2): qty 2
  Filled 2024-01-18: qty 1
  Filled 2024-01-18 (×2): qty 2

## 2024-01-18 MED ORDER — LIDOCAINE HCL (CARDIAC) PF 100 MG/5ML IV SOSY
PREFILLED_SYRINGE | INTRAVENOUS | Status: DC | PRN
  Administered 2024-01-18: 80 mg via INTRAVENOUS

## 2024-01-18 MED ORDER — ONDANSETRON HCL 4 MG/2ML IJ SOLN: 4.0000 mg | Freq: Four times a day (QID) | INTRAMUSCULAR | Status: DC | PRN

## 2024-01-18 MED ORDER — BUPIVACAINE LIPOSOME 1.3 % IJ SUSP
INTRAMUSCULAR | Status: DC | PRN
  Administered 2024-01-18: 10 mL via PERINEURAL

## 2024-01-18 MED ORDER — DEXAMETHASONE SODIUM PHOSPHATE 10 MG/ML IJ SOLN
INTRAMUSCULAR | Status: DC | PRN
  Administered 2024-01-18: 5 mg via INTRAVENOUS

## 2024-01-18 MED ORDER — ONDANSETRON HCL 4 MG/2ML IJ SOLN
INTRAMUSCULAR | Status: AC
Start: 2024-01-18 — End: 2024-01-18
  Filled 2024-01-18: qty 2

## 2024-01-18 MED ORDER — PROPOFOL 10 MG/ML IV BOLUS
INTRAVENOUS | Status: DC | PRN
  Administered 2024-01-18: 90 mg via INTRAVENOUS

## 2024-01-18 MED ORDER — PHENOL 1.4 % MT LIQD: 1.0000 | OROMUCOSAL | Status: DC | PRN

## 2024-01-18 MED ORDER — CHLORHEXIDINE GLUCONATE 0.12 % MT SOLN
15.0000 mL | Freq: Once | OROMUCOSAL | Status: AC
  Administered 2024-01-18: 15 mL via OROMUCOSAL

## 2024-01-18 MED ORDER — DIPHENHYDRAMINE HCL 12.5 MG/5ML PO ELIX: 12.5000 mg | ORAL_SOLUTION | ORAL | Status: DC | PRN

## 2024-01-18 MED ORDER — PROPOFOL 10 MG/ML IV BOLUS
INTRAVENOUS | Status: AC
  Filled 2024-01-18: qty 20

## 2024-01-18 MED ORDER — FENTANYL CITRATE (PF) 250 MCG/5ML IJ SOLN
INTRAMUSCULAR | Status: DC | PRN
  Administered 2024-01-18 (×2): 50 ug via INTRAVENOUS

## 2024-01-18 MED ORDER — DOCUSATE SODIUM 100 MG PO CAPS
100.0000 mg | ORAL_CAPSULE | Freq: Two times a day (BID) | ORAL | Status: DC
  Administered 2024-01-18 – 2024-01-24 (×12): 100 mg via ORAL
  Filled 2024-01-18 (×12): qty 1

## 2024-01-18 MED ORDER — FENTANYL CITRATE PF 50 MCG/ML IJ SOSY: 25.0000 ug | PREFILLED_SYRINGE | INTRAMUSCULAR | Status: DC | PRN

## 2024-01-18 MED ORDER — POLYETHYLENE GLYCOL 3350 17 G PO PACK
17.0000 g | PACK | Freq: Every day | ORAL | Status: DC | PRN
Start: 2024-01-18 — End: 2024-01-24
  Administered 2024-01-20: 17 g via ORAL
  Filled 2024-01-18: qty 1

## 2024-01-18 MED ORDER — MAGNESIUM CITRATE PO SOLN: 1.0000 | Freq: Once | ORAL | Status: DC | PRN

## 2024-01-18 MED ORDER — ORAL CARE MOUTH RINSE
15.0000 mL | Freq: Once | OROMUCOSAL | Status: AC
Start: 2024-01-18 — End: 2024-01-18

## 2024-01-18 MED ORDER — PHENYLEPHRINE HCL-NACL 20-0.9 MG/250ML-% IV SOLN
INTRAVENOUS | Status: DC | PRN
  Administered 2024-01-18: 50 ug/min via INTRAVENOUS

## 2024-01-18 MED ORDER — EPHEDRINE 5 MG/ML INJ
INTRAVENOUS | Status: AC
  Filled 2024-01-18: qty 5

## 2024-01-18 MED ORDER — STERILE WATER FOR IRRIGATION IR SOLN
Status: DC | PRN
  Administered 2024-01-18: 2000 mL

## 2024-01-18 MED ORDER — ONDANSETRON HCL 4 MG PO TABS: 4.0000 mg | ORAL_TABLET | Freq: Four times a day (QID) | ORAL | Status: DC | PRN

## 2024-01-18 MED ORDER — SUGAMMADEX SODIUM 200 MG/2ML IV SOLN
INTRAVENOUS | Status: DC | PRN
  Administered 2024-01-18: 150 mg via INTRAVENOUS

## 2024-01-18 MED ORDER — LEVOTHYROXINE SODIUM 125 MCG PO TABS
125.0000 ug | ORAL_TABLET | Freq: Every day | ORAL | Status: DC
  Administered 2024-01-19 – 2024-01-24 (×6): 125 ug via ORAL
  Filled 2024-01-18 (×6): qty 1

## 2024-01-18 SURGICAL SUPPLY — 56 items
BAG COUNTER SPONGE SURGICOUNT (BAG) IMPLANT
BAG ZIPLOCK 12X15 (MISCELLANEOUS) ×1 IMPLANT
BASEPLATE GLENOSPHERE 25 (Plate) IMPLANT
BEARING HUMERAL SHLDER 36M STD (Shoulder) IMPLANT
BIT DRILL TWIST 2.7 (BIT) IMPLANT
BLADE SAG 18X100X1.27 (BLADE) IMPLANT
BLADE SAW SGTL 73X25 THK (BLADE) ×1 IMPLANT
CEMENT BONE DEPUY (Cement) IMPLANT
CLSR STERI-STRIP ANTIMIC 1/2X4 (GAUZE/BANDAGES/DRESSINGS) ×1 IMPLANT
COOLER ICEMAN CLASSIC (MISCELLANEOUS) ×1 IMPLANT
COVER BACK TABLE 60X90IN (DRAPES) ×1 IMPLANT
COVER SURGICAL LIGHT HANDLE (MISCELLANEOUS) ×1 IMPLANT
DRAPE POUCH INSTRU U-SHP 10X18 (DRAPES) ×1 IMPLANT
DRAPE SHEET LG 3/4 BI-LAMINATE (DRAPES) ×2 IMPLANT
DRAPE SURG 17X11 SM STRL (DRAPES) ×1 IMPLANT
DRAPE SURG 17X23 STRL (DRAPES) ×1 IMPLANT
DRAPE SURG ORHT 6 SPLT 77X108 (DRAPES) ×2 IMPLANT
DRAPE TOP 10253 STERILE (DRAPES) ×1 IMPLANT
DRAPE U-SHAPE 47X51 STRL (DRAPES) ×1 IMPLANT
DRSG MEPILEX POST OP 4X8 (GAUZE/BANDAGES/DRESSINGS) ×1 IMPLANT
DURAPREP 26ML APPLICATOR (WOUND CARE) ×2 IMPLANT
ELECT BLADE TIP CTD 4 INCH (ELECTRODE) ×1 IMPLANT
ELECT REM PT RETURN 15FT ADLT (MISCELLANEOUS) ×1 IMPLANT
FACESHIELD WRAPAROUND (MASK) ×1 IMPLANT
FACESHIELD WRAPAROUND OR TEAM (MASK) ×1 IMPLANT
GLENOID SPHERE STD STRL 36MM (Orthopedic Implant) IMPLANT
GLOVE BIO SURGEON STRL SZ 6.5 (GLOVE) ×1 IMPLANT
GLOVE BIOGEL PI IND STRL 7.0 (GLOVE) ×1 IMPLANT
GLOVE BIOGEL PI IND STRL 8 (GLOVE) ×1 IMPLANT
GLOVE ORTHO TXT STRL SZ7.5 (GLOVE) ×1 IMPLANT
GOWN STRL SURGICAL XL XLNG (GOWN DISPOSABLE) ×2 IMPLANT
HOOD PEEL AWAY T7 (MISCELLANEOUS) ×2 IMPLANT
KIT BASIN OR (CUSTOM PROCEDURE TRAY) ×1 IMPLANT
KIT TURNOVER KIT A (KITS) ×1 IMPLANT
PACK SHOULDER (CUSTOM PROCEDURE TRAY) ×1 IMPLANT
PAD COLD SHLDR WRAP-ON (PAD) ×1 IMPLANT
PIN THREADED REVERSE (PIN) IMPLANT
RESTRAINT HEAD UNIVERSAL NS (MISCELLANEOUS) ×1 IMPLANT
SCREW BONE LOCKING 4.75X30X3.5 (Screw) IMPLANT
SCREW BONE STRL 6.5MMX25MM (Screw) IMPLANT
SCREW LOCKING 4.75MMX15MM (Screw) IMPLANT
SCREW LOCKING NS 4.75MMX20MM (Screw) IMPLANT
SLING ARM FOAM STRAP LRG (SOFTGOODS) IMPLANT
SLING ARM IMMOBILIZER LRG (SOFTGOODS) ×1 IMPLANT
SPONGE T-LAP 4X18 ~~LOC~~+RFID (SPONGE) IMPLANT
STEM HUMERAL STRL 10MMX122MM (Stem) IMPLANT
SUCTION TUBE FRAZIER 12FR DISP (SUCTIONS) ×1 IMPLANT
SUPPORT WRAP ARM LG (MISCELLANEOUS) ×1 IMPLANT
SUT MAXBRAID #5 CCS-NDL 2PK (SUTURE) IMPLANT
SUT MNCRL AB 3-0 PS2 18 (SUTURE) ×1 IMPLANT
SUT VIC AB 1 CT1 36 (SUTURE) ×1 IMPLANT
SUT VIC AB 2-0 CT1 TAPERPNT 27 (SUTURE) ×1 IMPLANT
TOWEL OR 17X26 10 PK STRL BLUE (TOWEL DISPOSABLE) ×1 IMPLANT
TOWER SMARTMIX MINI (MISCELLANEOUS) IMPLANT
TRAY HUM MINI SHOULDER +3 40 (Joint) IMPLANT
TUBE SUCTION HIGH CAP CLEAR NV (SUCTIONS) ×1 IMPLANT

## 2024-01-18 NOTE — Interval H&P Note (Signed)
 History and Physical Interval Note:  01/18/2024 1:12 PM  Felicia Lowe  has presented today for surgery, with the diagnosis of RIGHT PROXIMAL HUMERUS FRACTURE.  The various methods of treatment have been discussed with the patient and family. After consideration of risks, benefits and other options for treatment, the patient has consented to  Procedure(s): ARTHROPLASTY, SHOULDER, TOTAL, REVERSE (Right) as a surgical intervention.  The patient's history has been reviewed, patient examined, no change in status, stable for surgery.  I have reviewed the patient's chart and labs.  Questions were answered to the patient's satisfaction.     Fonda SHAUNNA Olmsted

## 2024-01-18 NOTE — Anesthesia Procedure Notes (Signed)
 Procedure Name: Intubation Date/Time: 01/18/2024 3:07 PM  Performed by: Dasie Nena PARAS, CRNAPre-anesthesia Checklist: Patient identified, Emergency Drugs available, Suction available, Patient being monitored and Timeout performed Patient Re-evaluated:Patient Re-evaluated prior to induction Oxygen Delivery Method: Circle system utilized Preoxygenation: Pre-oxygenation with 100% oxygen Induction Type: IV induction Ventilation: Mask ventilation without difficulty Laryngoscope Size: Miller and 3 Grade View: Grade III Tube type: Oral Tube size: 7.0 mm Number of attempts: 1 Airway Equipment and Method: Stylet Placement Confirmation: ETT inserted through vocal cords under direct vision, positive ETCO2 and breath sounds checked- equal and bilateral Secured at: 22 cm Tube secured with: Tape Dental Injury: Teeth and Oropharynx as per pre-operative assessment  Difficulty Due To: Difficulty was anticipated, Difficult Airway- due to anterior larynx and Difficult Airway- due to reduced neck mobility

## 2024-01-18 NOTE — Anesthesia Procedure Notes (Signed)
 Anesthesia Regional Block: Interscalene brachial plexus block   Pre-Anesthetic Checklist: , timeout performed,  Correct Patient, Correct Site, Correct Laterality,  Correct Procedure, Correct Position, site marked,  Risks and benefits discussed,  Surgical consent,  Pre-op evaluation,  At surgeon's request and post-op pain management  Laterality: Right  Prep: chloraprep       Needles:  Injection technique: Single-shot  Needle Type: Echogenic Stimulator Needle     Needle Length: 9cm  Needle Gauge: 22     Additional Needles:   Procedures:,,,, ultrasound used (permanent image in chart),,    Narrative:  Start time: 01/18/2024 2:14 PM End time: 01/18/2024 2:20 PM Injection made incrementally with aspirations every 5 mL.  Performed by: Personally  Anesthesiologist: Corinne Garnette BRAVO, MD  Additional Notes: Functioning IV was confirmed and monitors were applied.  A 90mm 22ga echogenic stimulator needle was used. Sterile prep and drape, hand hygiene, and sterile gloves were used.  Negative aspiration and negative test dose prior to incremental administration of local anesthetic. The patient tolerated the procedure well.  Ultrasound guidance: relevent anatomy identified, needle position confirmed, local anesthetic spread visualized around nerve(s), vascular puncture avoided.  Image printed for medical record.

## 2024-01-18 NOTE — Op Note (Signed)
 01/18/2024  5:26 PM  PATIENT:  Felicia Lowe    PRE-OPERATIVE DIAGNOSIS: Right proximal humerus fracture with rotator cuff tear  POST-OPERATIVE DIAGNOSIS:  Same  PROCEDURE:   Right reverse Total Shoulder Arthroplasty with repair of lesser and greater tuberosity including open rotator cuff repair of chronic tear  SURGEON:  Fonda SHAUNNA Olmsted, MD  PHYSICIAN ASSISTANT: Army Daring, PA-C, present and scrubbed throughout the case, critical for completion in a timely fashion, and for retraction, instrumentation, and closure.  ANESTHESIA:   General with interscalene block using Exparel   ESTIMATED BLOOD LOSS: 200 mL  UNIQUE ASPECTS OF THE CASE: Bone quality was fairly poor.  The subscapularis was present and intact, and they had a good osteotomy that repaired nicely.  The supraspinatus did have a medium sized tear in it, I did repair the supraspinatus along with the tuberosity repair, in part just to hopefully maintain some level of infraspinatus function, but the tissue in that location was fairly poor, and there was a fragment of bone that I ultimately excised because it was really not very functional at the distal attachment.  I did place bone graft around the head.  I had a very nice reduction with soft tissue tension on the cemented prosthesis.  PREOPERATIVE INDICATIONS:  Felicia Lowe is a  83 y.o. female with a diagnosis of RIGHT PROXIMAL HUMERUS FRACTURE who failed conservative measures and elected for surgical management.    The risks benefits and alternatives were discussed with the patient preoperatively including but not limited to the risks of infection, bleeding, nerve injury, cardiopulmonary complications, the need for revision surgery, dislocation, brachial plexus palsy, incomplete relief of pain, among others, and the patient was willing to proceed.  OPERATIVE IMPLANTS:   Implant Name: BASEPLATE GLENOSPHERE 25 - [id number] Type: Plate Inv. Item: BASEPLATE GLENOSPHERE 25 Serial  No.:  Manufacturer: [employer](ORTH,TRAU,BIO,SG) Lot No.: [id number] LRB: Right No. Used: 1 Action: Implanted   Implant Name: SCREW BONE STRL 6.4FFK74FF - [id number] Type: Screw Inv. Item: SCREW BONE STRL 6.4FFK74FF Serial No.:  Manufacturer: [employer](ORTH,TRAU,BIO,SG) Lot No.: [id number] LRB: Right No. Used: 1 Action: Implanted   Implant Name: SCREW LOCKING NS 4.24FFK79FF - [id number] Type: Screw Inv. Item: SCREW LOCKING NS 4.24FFK79FF Serial No.:  Manufacturer: [employer](ORTH,TRAU,BIO,SG) Lot No.: [id number] LRB: Right No. Used: 1 Action: Implanted   Implant Name: AMPARO FAVA 4.75MMX15MM - [id number] Type: Screw Inv. Item: SCREW LOCKING 4.75MMX15MM Serial No.:  Manufacturer: [employer](ORTH,TRAU,BIO,SG) Lot No.: [id number] LRB: Right No. Used: 1 Action: Implanted   Implant NameBETHA AMPARO FAVA 4.75MMX15MM - [id number] Type: Screw Inv. Item: SCREW LOCKING 4.75MMX15MM Serial No.:  Manufacturer: [employer](ORTH,TRAU,BIO,SG) Lot No.: [id number] LRB: Right No. Used: 1 Action: Implanted   Implant Name: SCREW BONE LOCKING [id number] - [id number] Type: Screw Inv. Item: SCREW BONE LOCKING [id number] Serial No.:  Manufacturer: [employer](ORTH,TRAU,BIO,SG) Lot No.: [id number] LRB: Right No. Used: 1 Action: Implanted   Implant Name: DINO BAL STD STRL - [id number] Type: Orthopedic Implant Inv. Item: GLENOID SPHERE STD STRL Serial No.:  Manufacturer: [employer](ORTH,TRAU,BIO,SG) Lot No.: [id number] LRB: Right No. Used: 1 Action: Implanted   Implant Name: CEMENT BONE DEPUY - [id number] Type: Cement Inv. Item: CEMENT BONE DEPUY Serial No.:  Manufacturer: [employer] Lot No.: [id number] LRB: Right No. Used: 1 Action: Implanted   Implant Name: STEM HUMERAL STRL [id number] - [id number] Type: Stem Inv. Item: STEM HUMERAL STRL [id number] Serial No.:  Manufacturer: [employer](ORTH,TRAU,BIO,SG) Lot No.: [id number] LRB:  Right No. Used: 1 Action: Implanted  Implant Name: BEARING HUMERAL SHLDER 46M STD - [id number] Type: Shoulder Inv. Item: BEARING HUMERAL SHLDER 46M STD Serial No.:  Manufacturer: [employer](ORTH,TRAU,BIO,SG) Lot No.: [id number] LRB: Right No. Used: 1 Action: Implanted   Implant Name: TRAY HUM MINI SHOULDER +3 40 - [id number] Type: Joint Inv. Item: TRAY HUM MINI SHOULDER +3 40 Serial No.:  Manufacturer: [employer](ORTH,TRAU,BIO,SG) Lot No.: [id number] LRB: Right No. Used: 1 Action: Implanted   OPERATIVE FINDINGS: Comminuted four-part proximal humerus fracture with articular cartilage impaction and a head splitting pattern.  Positive supraspinatus tear that appeared chronic.  OPERATIVE PROCEDURE: The patient was brought to the operating room and placed in the supine position. General anesthesia was administered. IV antibiotics were given.  I performed a prescrub with chlorhexidine .  Time out was performed. The upper extremity was prepped and draped in usual sterile fashion. The patient was in a beachchair position. Deltopectoral approach was carried out. The biceps was tenodesed to the pectoralis tendon with #2 Maxbraid.  I used an osteotome to perform a lesser tuberosity osteotomy, and then placed 2 tack sutures through the subscapularis.  I then mobilized the supraspinatus, remove the humeral head fragment, and passed 2 sutures through the supraspinatus tear as well as the infraspinatus.  I then turned my attention to the glenoid.  Deep retractors were placed, and I resected the labrum, and then placed a guidepin into the center position on the glenoid, with slight inferior inclination.  There was a moderate-sized anterior glenoid osteophyte which I removed with a rongeur.  I then reamed over the guidepin, and this created a small metaphyseal cancellus blush inferiorly, removing just the cartilage to the subchondral bone superiorly. The base plate was selected and impacted place,  and then I secured it centrally with a nonlocking screw, and I had excellent purchase both inferiorly and superiorly. I placed a short locking screws on anterior and posterior aspects.  I then turned my attention to the glenosphere, and impacted this into place, placing slight inferior offset (set on B).   The glenosphere was completely seated, and had engagement of the [facility] taper. I then turned my attention back to the humerus.  I sequentially broached, and the size 10 fracture stem fit reasonably nicely, so I selected an 8 to cement.  I placed a total of 2 #5 max braid sutures through the shaft for future repair of the tuberosities.  I then cemented the final implant into place, at approximately #3 on the fracture stem restoring the height.  Once the cement had cured I trialed, and the standard height felt appropriate with excellent soft tissue tension.  Therefore the above named components were selected. The shoulder felt stable throughout functional motion.  I used the max braid sutures through the fins on the humerus and then bringing the infraspinatus and supraspinatus sutures through the subscapularis, and vice versa, suturing all of this together, placing bone graft around the proximal humerus, and tuberosity, and then also using the around [place] sutures to repair the tuberosities down to the shaft.  I did excise a small piece of bone that was at the distal end of the supraspinatus because I did not feel I could get it to sit down anatomically and it would be bulky, and potentially impinge.  I then irrigated the shoulder copiously once more, repaired the deltopectoral interval with Vicryl followed by subcutaneous Vicryl with Steri-Strips and sterile gauze for the skin. The patient was awakened and returned back in stable and satisfactory condition. There were  no complications and she tolerated the procedure well.

## 2024-01-18 NOTE — Transfer of Care (Signed)
 Immediate Anesthesia Transfer of Care Note  Patient: Felicia Lowe  Procedure(s) Performed: ARTHROPLASTY, SHOULDER, TOTAL, REVERSE (Right: Shoulder)  Patient Location: PACU  Anesthesia Type:General  Level of Consciousness: awake, alert , and oriented  Airway & Oxygen Therapy: Patient Spontanous Breathing  Post-op Assessment: Report given to RN  Post vital signs: Reviewed and stable  Last Vitals:  Vitals Value Taken Time  BP 135/52 01/18/24 17:54  Temp    Pulse 102 01/18/24 17:58  Resp 16 01/18/24 17:58  SpO2 98 % 01/18/24 17:58  Vitals shown include unfiled device data.  Last Pain:  Vitals:   01/18/24 1412  TempSrc: Oral         Complications: No notable events documented.

## 2024-01-19 DIAGNOSIS — E039 Hypothyroidism, unspecified: Secondary | ICD-10-CM | POA: Diagnosis present

## 2024-01-19 DIAGNOSIS — W19XXXA Unspecified fall, initial encounter: Secondary | ICD-10-CM | POA: Diagnosis present

## 2024-01-19 DIAGNOSIS — K219 Gastro-esophageal reflux disease without esophagitis: Secondary | ICD-10-CM | POA: Diagnosis present

## 2024-01-19 DIAGNOSIS — L89322 Pressure ulcer of left buttock, stage 2: Secondary | ICD-10-CM | POA: Diagnosis present

## 2024-01-19 DIAGNOSIS — Y92019 Unspecified place in single-family (private) house as the place of occurrence of the external cause: Secondary | ICD-10-CM | POA: Diagnosis not present

## 2024-01-19 DIAGNOSIS — Z881 Allergy status to other antibiotic agents status: Secondary | ICD-10-CM | POA: Diagnosis not present

## 2024-01-19 DIAGNOSIS — Z882 Allergy status to sulfonamides status: Secondary | ICD-10-CM | POA: Diagnosis not present

## 2024-01-19 DIAGNOSIS — Z87442 Personal history of urinary calculi: Secondary | ICD-10-CM | POA: Diagnosis not present

## 2024-01-19 DIAGNOSIS — S42241A 4-part fracture of surgical neck of right humerus, initial encounter for closed fracture: Secondary | ICD-10-CM | POA: Diagnosis present

## 2024-01-19 DIAGNOSIS — Z83438 Family history of other disorder of lipoprotein metabolism and other lipidemia: Secondary | ICD-10-CM | POA: Diagnosis not present

## 2024-01-19 DIAGNOSIS — L8990 Pressure ulcer of unspecified site, unspecified stage: Secondary | ICD-10-CM | POA: Diagnosis present

## 2024-01-19 DIAGNOSIS — E559 Vitamin D deficiency, unspecified: Secondary | ICD-10-CM | POA: Diagnosis present

## 2024-01-19 DIAGNOSIS — Z803 Family history of malignant neoplasm of breast: Secondary | ICD-10-CM | POA: Diagnosis not present

## 2024-01-19 DIAGNOSIS — Z6826 Body mass index (BMI) 26.0-26.9, adult: Secondary | ICD-10-CM | POA: Diagnosis not present

## 2024-01-19 DIAGNOSIS — Z96643 Presence of artificial hip joint, bilateral: Secondary | ICD-10-CM | POA: Diagnosis present

## 2024-01-19 DIAGNOSIS — L89312 Pressure ulcer of right buttock, stage 2: Secondary | ICD-10-CM | POA: Diagnosis present

## 2024-01-19 DIAGNOSIS — Z7982 Long term (current) use of aspirin: Secondary | ICD-10-CM | POA: Diagnosis not present

## 2024-01-19 DIAGNOSIS — Z880 Allergy status to penicillin: Secondary | ICD-10-CM | POA: Diagnosis not present

## 2024-01-19 DIAGNOSIS — Z8249 Family history of ischemic heart disease and other diseases of the circulatory system: Secondary | ICD-10-CM | POA: Diagnosis not present

## 2024-01-19 DIAGNOSIS — Z79890 Hormone replacement therapy (postmenopausal): Secondary | ICD-10-CM | POA: Diagnosis not present

## 2024-01-19 DIAGNOSIS — Z79899 Other long term (current) drug therapy: Secondary | ICD-10-CM | POA: Diagnosis not present

## 2024-01-19 DIAGNOSIS — S4291XA Fracture of right shoulder girdle, part unspecified, initial encounter for closed fracture: Secondary | ICD-10-CM | POA: Diagnosis present

## 2024-01-19 DIAGNOSIS — M75101 Unspecified rotator cuff tear or rupture of right shoulder, not specified as traumatic: Secondary | ICD-10-CM | POA: Diagnosis present

## 2024-01-19 DIAGNOSIS — E669 Obesity, unspecified: Secondary | ICD-10-CM | POA: Diagnosis present

## 2024-01-19 DIAGNOSIS — Z8349 Family history of other endocrine, nutritional and metabolic diseases: Secondary | ICD-10-CM | POA: Diagnosis not present

## 2024-01-19 DIAGNOSIS — I10 Essential (primary) hypertension: Secondary | ICD-10-CM | POA: Diagnosis present

## 2024-01-19 DIAGNOSIS — G8929 Other chronic pain: Secondary | ICD-10-CM | POA: Diagnosis present

## 2024-01-19 DIAGNOSIS — M810 Age-related osteoporosis without current pathological fracture: Secondary | ICD-10-CM | POA: Diagnosis present

## 2024-01-19 LAB — BASIC METABOLIC PANEL WITH GFR
Anion gap: 11 (ref 5–15)
BUN: 8 mg/dL (ref 8–23)
CO2: 26 mmol/L (ref 22–32)
Calcium: 7.7 mg/dL — ABNORMAL LOW (ref 8.9–10.3)
Chloride: 103 mmol/L (ref 98–111)
Creatinine, Ser: 0.68 mg/dL (ref 0.44–1.00)
GFR, Estimated: >60 mL/min (ref >60)
Glucose, Bld: 122 mg/dL — ABNORMAL HIGH (ref 70–99)
Potassium: 3.1 mmol/L — ABNORMAL LOW (ref 3.5–5.1)
Sodium: 140 mmol/L (ref 135–145)

## 2024-01-19 MED ORDER — ASPIRIN 325 MG PO TABS
325.0000 mg | ORAL_TABLET | Freq: Two times a day (BID) | ORAL | Status: DC
  Administered 2024-01-20 – 2024-01-24 (×10): 325 mg via ORAL
  Filled 2024-01-19 (×10): qty 1

## 2024-01-19 MED ORDER — POTASSIUM CHLORIDE IN NACL 20-0.9 MEQ/L-% IV SOLN
INTRAVENOUS | Status: DC
  Filled 2024-01-19 (×2): qty 1000

## 2024-01-19 NOTE — Progress Notes (Signed)
 Subjective: 1 Day Post-Op s/p Procedure(s): ARTHROPLASTY, SHOULDER, TOTAL, REVERSE   Patient is alert, oriented. Laying in bed in no acute distress. Laying on left side as she has a pressure ulcer from sitting that she is trying to stay off of. No other complaints. Daughter in room with patient today.     Objective:  PE: VITALS:   Vitals:   01/18/24 1858 01/18/24 2147 01/19/24 0056 01/19/24 0611  BP: 132/63 135/64 137/63 125/63  Pulse: 98 90 98 76  Resp: 16 16 17 16   Temp: 98.7 F (37.1 C) 98.2 F (36.8 C) 97.8 F (36.6 C) 98.4 F (36.9 C)  TempSrc:  Oral    SpO2: 92% 100% 98% 96%  Weight:      Height:        Sitting up in bed in no acute distress Neurovascular intact Sensation intact distally Intact pulses distally Incision: dressing C/D/I Able to flex, extend and abduct all fingers  LABS  Results for orders placed or performed during the hospital encounter of 01/18/24 (from the past 24 hours)  Surgical pcr screen     Status: None   Collection Time: 01/18/24 12:09 PM   Specimen: Nasal Mucosa; Nasal Swab  Result Value Ref Range   MRSA, PCR NEGATIVE NEGATIVE   Staphylococcus aureus NEGATIVE NEGATIVE  CBC per protocol     Status: None   Collection Time: 01/18/24 12:22 PM  Result Value Ref Range   WBC 6.8 4.0 - 10.5 K/uL   RBC 4.39 3.87 - 5.11 MIL/uL   Hemoglobin 13.3 12.0 - 15.0 g/dL   HCT 60.5 63.9 - 53.9 %   MCV 89.7 80.0 - 100.0 fL   MCH 30.3 26.0 - 34.0 pg   MCHC 33.8 30.0 - 36.0 g/dL   RDW 87.2 88.4 - 84.4 %   Platelets 349 150 - 400 K/uL   nRBC 0.0 0.0 - 0.2 %  Basic metabolic panel per protocol     Status: Abnormal   Collection Time: 01/18/24 12:22 PM  Result Value Ref Range   Sodium 137 135 - 145 mmol/L   Potassium 3.2 (L) 3.5 - 5.1 mmol/L   Chloride 98 98 - 111 mmol/L   CO2 26 22 - 32 mmol/L   Glucose, Bld 74 70 - 99 mg/dL   BUN 9 8 - 23 mg/dL   Creatinine, Ser 9.39 0.44 - 1.00 mg/dL   Calcium 9.1 8.9 - 89.6 mg/dL   GFR, Estimated >39  >39 mL/min   Anion gap 13 5 - 15    DG Shoulder Right Port Result Date: 01/18/2024 CLINICAL DATA:  [id number] S/P reverse total shoulder arthroplasty, right [id number] EXAM: RIGHT SHOULDER - 1 VIEW COMPARISON:  CT right shoulder 01/14/2024 FINDINGS: Status post total reverse right shoulder arthroplasty. Redemonstration of underlying acute proximal humeral fracture. There is no evidence of new acute fracture or dislocation. There is no evidence of arthropathy or other focal bone abnormality. Right shoulder subcutaneus soft tissue edema and emphysema consistent with postsurgical changes. IMPRESSION: 1. Status post total reverse right shoulder arthroplasty. 2. Redemonstration of underlying acute proximal humeral fracture. Electronically Signed   By: Morgane  Naveau M.D.   On: 01/18/2024 19:39    Assessment/Plan: Principal Problem:   S/P reverse total shoulder arthroplasty, right  1 Day Post-Op s/p Procedure(s): ARTHROPLASTY, SHOULDER, TOTAL, REVERSE  Weightbearing: NWB RUE Insicional and dressing care: Reinforce dressings as needed Orthopedic device(s): sling in place at all times VTE prophylaxis: aspirin  while inpatient Pain control: continue  current regimen, limit narcotics as much as possible Follow - up plan: 2 weeks with Dr. Josefina Dispo: Patient does not have safe discharge plan, lives at home with husband who is unable to help her after surgery. She is likely to need SNF placement. I've placed PT and OT evals.    Contact information:   Army Daring, DEVONNA Tzzxijbd 8-5  After hours and holidays please check [email or web address] for group call information for [facility]  Army MARLA Daring 01/19/2024, 9:17 AM

## 2024-01-19 NOTE — Anesthesia Postprocedure Evaluation (Signed)
 Anesthesia Post Note  Patient: Felicia Lowe  Procedure(s) Performed: ARTHROPLASTY, SHOULDER, TOTAL, REVERSE (Right: Shoulder)     Patient location during evaluation: PACU Anesthesia Type: General Level of consciousness: awake and alert Pain management: pain level controlled Vital Signs Assessment: post-procedure vital signs reviewed and stable Respiratory status: spontaneous breathing, nonlabored ventilation, respiratory function stable and patient connected to nasal cannula oxygen Cardiovascular status: blood pressure returned to baseline and stable Postop Assessment: no apparent nausea or vomiting Anesthetic complications: no   No notable events documented.  Last Vitals:    Last Pain:                 Garnette FORBES Skillern

## 2024-01-19 NOTE — TOC Initial Note (Signed)
 Transition of Care [facility]) - Initial/Assessment Note    Patient Details  Name: Felicia Lowe MRN: [id number] Date of Birth: [birth year]  Transition of Care [facility]) CM/SW Contact:    NORMAN ASPEN, LCSW Phone Number: 01/19/2024, 12:48 PM  Clinical Narrative:                  Met with pt and daughter today to review anticipated dc needs.  Pt confirms that she lives at home with her 83 y.o. spouse who cannot provide any physical assistance to her at dc.  Daughter, Felicia, does live locally and supportive.  Their primary concern is patient's new assistance needs post surgery and questioning if SNF rehab may be best option initially before returning home.  We reviewed SNF placement process, insurance authorization, etc.  Awaiting therapy evaluations to determine if SNF recommended.      Expected Discharge Plan: Skilled Nursing Facility Barriers to Discharge: Continued Medical Work up, English as a second language teacher, SNF Pending bed offer   Patient Goals and CMS Choice Patient states their goals for this hospitalization and ongoing recovery are:: return home following short term rehab          Expected Discharge Plan and Services In-house Referral: Clinical Social Work     Living arrangements for the past 2 months: Single Family Home                                      Prior Living Arrangements/Services Living arrangements for the past 2 months: Single Family Home Lives with:: Spouse Patient language and need for interpreter reviewed:: Yes Do you feel safe going back to the place where you live?: Yes      Need for Family Participation in Patient Care: Yes (Comment) Care giver support system in place?: No (comment)   Criminal Activity/Legal Involvement Pertinent to Current Situation/Hospitalization: No - Comment as needed  Activities of Daily Living   ADL Screening (condition at time of admission) Independently performs ADLs?: No Does the patient have a NEW difficulty with  bathing/dressing/toileting/self-feeding that is expected to last >3 days?: Yes (Initiates electronic notice to provider for possible OT consult) Does the patient have a NEW difficulty with getting in/out of bed, walking, or climbing stairs that is expected to last >3 days?: Yes (Initiates electronic notice to provider for possible PT consult) Does the patient have a NEW difficulty with communication that is expected to last >3 days?: No Is the patient deaf or have difficulty hearing?: No Does the patient have difficulty seeing, even when wearing glasses/contacts?: No Does the patient have difficulty concentrating, remembering, or making decisions?: No  Permission Sought/Granted Permission sought to share information with : Family Supports Permission granted to share information with : Yes, Verbal Permission Granted  Share Information with NAME: daughter, Felicia Lowe @ [phone]           Emotional Assessment Appearance:: Appears stated age Attitude/Demeanor/Rapport: Gracious Affect (typically observed): Pleasant Orientation: : Oriented to Self, Oriented to Place, Oriented to  Time, Oriented to Situation Alcohol / Substance Use: Not Applicable Psych Involvement: No (comment)  Admission diagnosis:  S/P reverse total shoulder arthroplasty, right [Z96.611] Patient Active Problem List   Diagnosis Date Noted   S/P reverse total shoulder arthroplasty, right 01/18/2024   Leukocytosis 11/07/2022   Hypocalcemia 11/07/2022   SBO (small bowel obstruction) (HCC) 10/20/2022   Hypothyroidism 10/20/2022   Hypokalemia 10/20/2022   Low back pain 04/04/2019  Hyperlipidemia 05/30/2018   Osteopenia 05/30/2018   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   Hyperglycemia 05/25/2017   Atypical chest pain 04/04/2017   Pedal edema 04/04/2017   Preventative health care 11/25/2014   Unsteady gait 11/25/2014   Dizziness 09/10/2014   GERD (gastroesophageal reflux disease) 05/27/2014   Dermatitis  01/21/2014   Allergic drug rash 03/17/2013   Intertrigo 03/17/2013   BPV (benign positional vertigo) 02/19/2013   Urinary tract infection without hematuria 12/11/2012   Medicare annual wellness visit, subsequent 02/08/2012   Thyroid  disease    Essential hypertension    Vitamin D  deficiency    Mumps    Kidney stone    Obesity    Allergy     Arthritis    H/O breast biopsy    PCP:  Domenica Harlene LABOR, MD Pharmacy:   [facility] [phone] - [place], [place] - [address] [place] [address] Phone: [phone] Fax: [phone]     Social Drivers of Health (SDOH) Social History: SDOH Screenings   Food Insecurity: No Food Insecurity (01/18/2024)  Housing: Low Risk  (01/18/2024)  Transportation Needs: No Transportation Needs (01/18/2024)  Utilities: Not At Risk (01/18/2024)  Alcohol Screen: Low Risk  (03/18/2023)  Depression (PHQ2-9): Low Risk  (03/18/2023)  Financial Resource Strain: Low Risk  (03/18/2023)  Physical Activity: Inactive (03/18/2023)  Social Connections: Socially Integrated (01/18/2024)  Stress: No Stress Concern Present (03/18/2023)  Tobacco Use: Low Risk  (01/18/2024)  Health Literacy: Adequate Health Literacy (03/18/2023)   SDOH Interventions:     Readmission Risk Interventions     No data to display

## 2024-01-19 NOTE — Plan of Care (Signed)
  Problem: Pain Management: Goal: Pain level will decrease with appropriate interventions Outcome: Progressing   Problem: Activity: Goal: Ability to tolerate increased activity will improve Outcome: Progressing   Problem: Education: Goal: Knowledge of the prescribed therapeutic regimen will improve Outcome: Progressing   Problem: Safety: Goal: Ability to remain free from injury will improve Outcome: Progressing   Problem: Elimination: Goal: Will not experience complications related to urinary retention Outcome: Progressing

## 2024-01-19 NOTE — Evaluation (Signed)
 Occupational Therapy Evaluation Patient Details Name: Felicia Lowe MRN: [id number] DOB: April 09, 1941 Today's Date: 01/19/2024   History of Present Illness   83 yr old female here for evaluation of acute-on-chronic right shoulder pain. She had a fall while at home on 07/06, with acute onset of severe right shoulder pain. Found to have a R proximal humerus fracture with rotator cuff tear. Pt is now s/p a R TSA on 01/18/24. PMH: OA, HTN, scabies, bilateral TKA     Clinical Impressions The pt is currently presenting below her baseline level of functioning for self-care management, as she is limited by the below listed deficits (see OT problem list). OT provided education and instruction to the pt with regards to the following: exercise protocol, NWB status, no ROM of shoulder, sling wear schedule, post-op precautions, sling positioning, use of ice for pain and edema management, and correctly donning/doffing sling; further reinforcement as to the aforementioned is recommended.  During the session, she required min assist for supine to sit, mod assist for simulated lower body dressing, max assist to don RUE sling and ice man shoulder pad, and min assist to stand. 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 As such, she will benefit from OT services in the acute care, then post-acute care SNF setting, in order to maximize her safety and independence with self-care tasks.      If plan is discharge home, recommend the following:   A lot of help with bathing/dressing/bathroom;Assistance with cooking/housework;Assist for transportation;Help with stairs or ramp for entrance     Functional Status Assessment   Patient has had a recent decline in their functional status and demonstrates the ability to make significant improvements in function in a  reasonable and predictable amount of time.     Equipment Recommendations   None recommended by OT     Recommendations for Other Services         Precautions/Restrictions   Precautions Precautions: Fall;Shoulder Type of Shoulder Precautions: AROM elbow, wrist and hand to tolerance; No PROM of shoulder, No AROM of shoulder Shoulder Interventions: Shoulder sling/immobilizer Precaution Booklet Issued: Yes (comment) Precaution/Restrictions Comments: sling on at all times except ADLs and exercise Required Braces or Orthoses: Sling Restrictions Weight Bearing Restrictions Per Provider Order: Yes RUE Weight Bearing Per Provider Order: Non weight bearing     Mobility Bed Mobility Overal bed mobility: Needs Assistance Bed Mobility: Supine to Sit     Supine to sit: Min assist, HOB elevated          Transfers Overall transfer level: Needs assistance Equipment used: None Transfers: Sit to/from Stand Sit to Stand: Min assist                  Balance       Sitting balance - Comments:  (static sitting-good. dynamic sitting-fair+)     Standing balance-Leahy Scale: Poor        ADL either performed or assessed with clinical judgement   ADL Overall ADL's : Needs assistance/impaired Eating/Feeding: Set up;Sitting   Grooming: Minimal assistance;Sitting           Upper Body Dressing : Maximal assistance;Sitting   Lower Body Dressing: Moderate assistance;Sitting/lateral leans       Toileting- Clothing Manipulation and Hygiene: Moderate assistance;Sit to/from stand Toileting - Clothing Manipulation Details (indicate cue type and reason):  at bathroom level, based on clinical judgement             Vision   Additional Comments: She correctly read the time depicted on the wall clock.            Pertinent Vitals/Pain Pain Assessment Pain Assessment: 0-10 Pain Score: 7  Pain Location:  (R shoulder) Pain Intervention(s): Limited activity within  patient's tolerance, Monitored during session, Repositioned     Extremity/Trunk Assessment Upper Extremity Assessment Upper Extremity Assessment: Left hand dominant;RUE deficits/detail RUE Deficits / Details: AROM and strength WFL LUE Deficits / Details: shoulder immobilizer in place. AROM hand and wrist WFL   Lower Extremity Assessment Lower Extremity Assessment: Overall WFL for tasks assessed       Communication Communication Communication: No apparent difficulties   Cognition Arousal: Alert Behavior During Therapy: WFL for tasks assessed/performed               OT - Cognition Comments: Oriented x4                 Following commands: Intact                  Home Living Family/patient expects to be discharged to:: Private residence Living Arrangements: Spouse/significant other Available Help at Discharge: Family;Available PRN/intermittently Type of Home: House Home Access: Stairs to enter [employer] of Steps: 1   Home Layout: One level     Bathroom Shower/Tub: Walk-in shower         Home Equipment: Shower seat - built in;BSC/3in1   Additional Comments: adjustable bed.local dtr supportive however unable to assist 24/7      Prior Functioning/Environment Prior Level of Function : Independent/Modified Independent;Driving             Mobility Comments:  (Independent with ambulation.) ADLs Comments:  (She was independent with ADLs, cooking, and driving. She helps care for her spouse. She has hired assist for cleaning 2x per [employer].)    OT Problem List: Decreased strength;Decreased range of motion;Impaired balance (sitting and/or standing);Decreased knowledge of use of DME or AE;Decreased knowledge of precautions;Impaired UE functional use;Pain;Increased edema   OT Treatment/Interventions: Self-care/ADL training;Therapeutic exercise;Energy conservation;DME and/or AE instruction;Patient/family education;Therapeutic activities;Balance  training      OT Goals(Current goals can be found in the care plan section)   Acute Rehab OT Goals OT Goal Formulation: With patient Time For Goal Achievement: 02/02/24 Potential to Achieve Goals: Good ADL Goals Pt Will Perform Upper Body Dressing: with min assist Pt Will Perform Lower Body Dressing: with supervision;sit to/from stand;sitting/lateral leans Pt Will Transfer to Toilet: with supervision;ambulating Pt Will Perform Toileting - Clothing Manipulation and hygiene: with supervision;sit to/from stand   OT Frequency:  Min 2X/week       AM-PAC OT 6 Clicks Daily Activity     Outcome Measure Help from another person eating meals?: A Little Help from another person taking care of personal grooming?: A Little Help from another person toileting, which includes using toliet, bedpan, or urinal?: A Lot Help from another person bathing (including washing, rinsing, drying)?: A Lot Help from another person to put on and taking off regular upper body clothing?: A Lot Help from another person to put on and taking off regular lower body clothing?: A Lot 6 Click Score: 14   End of Session Equipment Utilized During Treatment: Gait belt Nurse Communication: Mobility status  Activity Tolerance: Patient limited by pain Patient left: in bed;with call bell/phone within reach;with bed alarm set;with family/visitor present  OT Visit Diagnosis: Muscle weakness (generalized) (M62.81);History of falling (Z91.81);Unsteadiness on feet (R26.81);Pain Pain - Right/Left: Right Pain - part of body: Shoulder                Time: 8742-8670 OT Time Calculation (min): 32 min Charges:  OT General Charges $OT Visit: 1 Visit OT Evaluation $OT Eval Moderate Complexity: 1 Mod OT Treatments $Self Care/Home Management : 8-22 mins    [name], OTR/L 01/19/2024, 4:41 PM

## 2024-01-19 NOTE — Evaluation (Signed)
 Physical Therapy Evaluation Patient Details Name: Felicia Lowe MRN: [id number] DOB: 1940-07-17 Today's Date: 01/19/2024  History of Present Illness  83 y.o. female  here for evaluation of acute-on-chronic right shoulder pain. She had a fall while at home on 07/06, with acute onset of severe right shoulder pain. Pt now s/p R rTSA on 01/18/24. PMH: OA, HTN, scabies, bil TKAs  Clinical Impression  Pt admitted with above diagnosis.  Pt is  mod I at baseline, amb with SPC. Pt  currently requires min assist with all aspects of mobility, amb hallway distance with LOB x1 needing min assist to safely recover.  Pt is at risk for additional falls and potential readmissions given limited caregiver support    Pt currently with functional limitations due to the deficits listed below (see PT Problem List). Pt will benefit from acute skilled PT to increase their independence and safety with mobility to allow discharge.           If plan is discharge home, recommend the following: A little help with walking and/or transfers;Assistance with cooking/housework;A lot of help with bathing/dressing/bathroom;Assist for transportation;Help with stairs or ramp for entrance   Can travel by private vehicle   No    Equipment Recommendations None recommended by PT (defer to SNF)  Recommendations for Other Services       Functional Status Assessment Patient has had a recent decline in their functional status and demonstrates the ability to make significant improvements in function in a reasonable and predictable amount of time.     Precautions / Restrictions Precautions Precautions: Fall;Shoulder Type of Shoulder Precautions: AROM elbow, wrist and hand to tolerance; No PROM of shoulder, No AROM of shoulder Shoulder Interventions: Shoulder sling/immobilizer;At all times Required Braces or Orthoses: Sling Restrictions RUE Weight Bearing Per Provider Order: Non weight bearing      Mobility  Bed Mobility Overal  bed mobility: Needs Assistance Bed Mobility: Rolling, Sidelying to Sit, Sit to Sidelying Rolling: Min assist Sidelying to sit: Min assist     Sit to sidelying: Min assist General bed mobility comments: cues for technique, assist to complete roll to L side, assist to elevate trunk to come to sit, assist to elevate LEs on to bed    Transfers Overall transfer level: Needs assistance Equipment used: Straight cane Transfers: Sit to/from Stand Sit to Stand: Min assist           General transfer comment: assist to rise and steady, wide compensatory BOS    Ambulation/Gait Ambulation/Gait assistance: Min assist Gait Distance (Feet): 70 Feet Assistive device: 1 person hand held assist Gait Pattern/deviations: Step-through pattern, Wide base of support       General Gait Details: unsteady gait throughout,  LOB x1 during turn, min assist to recover.  cues for posture and safety  Stairs            Wheelchair Mobility     Tilt Bed    Modified Rankin (Stroke Patients Only)       Balance Overall balance assessment: Needs assistance, History of Falls Sitting-balance support: Feet supported, No upper extremity supported   Sitting balance - Comments: Fair to good, able to wt shift outside BOS - limited by pain in sacral area and shoulder   Standing balance support: During functional activity, Single extremity supported Standing balance-Leahy Scale: Poor Standing balance comment: reliant on external assist and UE support. posterior lean on intial standing  Pertinent Vitals/Pain Pain Assessment Pain Assessment: Faces Faces Pain Scale: Hurts a little bit Pain Location: L shoulder Pain Descriptors / Indicators: Sore, Guarding Pain Intervention(s): Limited activity within patient's tolerance, Monitored during session, Premedicated before session    Home Living Family/patient expects to be discharged to:: Skilled nursing  facility Living Arrangements: Spouse/significant other Available Help at Discharge: Family Type of Home: House Home Access: Stairs to enter   [employer] of Steps: 1 step with grab bar   Home Layout: One level Home Equipment: Grab bars - toilet;BSC/3in1 Additional Comments: adjustable bed.local dtr supportive however unable to assist 24/7    Prior Function Prior Level of Function : Independent/Modified Independent             Mobility Comments: ind       Extremity/Trunk Assessment   Upper Extremity Assessment Upper Extremity Assessment: Defer to OT evaluation;LUE deficits/detail LUE Deficits / Details: shoulder immobilizer in place. AROM hand and wrist WFL    Lower Extremity Assessment Lower Extremity Assessment: Overall WFL for tasks assessed       Communication   Communication Communication: No apparent difficulties    Cognition Arousal: Alert Behavior During Therapy: WFL for tasks assessed/performed   PT - Cognitive impairments: No apparent impairments                         Following commands: Intact       Cueing Cueing Techniques: Verbal cues     General Comments      Exercises     Assessment/Plan    PT Assessment Patient needs continued PT services  PT Problem List Decreased strength;Decreased activity tolerance;Decreased balance;Decreased knowledge of use of DME;Decreased cognition;Pain;Decreased mobility;Decreased knowledge of precautions       PT Treatment Interventions DME instruction;Therapeutic exercise;Gait training;Functional mobility training;Therapeutic activities;Patient/family education;Balance training    PT Goals (Current goals can be found in the Care Plan section)  Acute Rehab PT Goals Patient Stated Goal: return to ind PT Goal Formulation: With patient Time For Goal Achievement: 02/02/24 Potential to Achieve Goals: Good    Frequency Min 4X/week     Co-evaluation               AM-PAC PT  6 Clicks Mobility  Outcome Measure Help needed turning from your back to your side while in a flat bed without using bedrails?: A Little Help needed moving from lying on your back to sitting on the side of a flat bed without using bedrails?: A Little Help needed moving to and from a bed to a chair (including a wheelchair)?: A Little Help needed standing up from a chair using your arms (e.g., wheelchair or bedside chair)?: A Little Help needed to walk in hospital room?: A Little Help needed climbing 3-5 steps with a railing? : A Lot 6 Click Score: 17    End of Session Equipment Utilized During Treatment: Gait belt;Other (comment) (sling) Activity Tolerance: Patient tolerated treatment well Patient left: in bed;with call bell/phone within reach;with bed alarm set Nurse Communication: Mobility status PT Visit Diagnosis: Other abnormalities of gait and mobility (R26.89)    Time: 8459-8376 PT Time Calculation (min) (ACUTE ONLY): 43 min   Charges:   PT Evaluation $PT Eval Low Complexity: 1 Low PT Treatments $Gait Training: 8-22 mins $Therapeutic Activity: 8-22 mins PT General Charges $$ ACUTE PT VISIT: 1 Visit         Rexene, PT  Acute Rehab Dept ([facility]) [phone]  01/19/2024  [facility] 01/19/2024, 4:28 PM

## 2024-01-19 NOTE — Care Management Obs Status (Signed)
 MEDICARE OBSERVATION STATUS NOTIFICATION   Patient Details  Name: Felicia Lowe MRN: [id number] Date of Birth: [birth year]   Medicare Observation Status Notification Given:  [name], LCSW 01/19/2024, 3:39 PM

## 2024-01-20 ENCOUNTER — Encounter (HOSPITAL_COMMUNITY): Payer: Self-pay | Admitting: Orthopedic Surgery

## 2024-01-20 LAB — CBC
HCT: 31.7 % — ABNORMAL LOW (ref 36.0–46.0)
Hemoglobin: 10.4 g/dL — ABNORMAL LOW (ref 12.0–15.0)
MCH: 30.4 pg (ref 26.0–34.0)
MCHC: 32.8 g/dL (ref 30.0–36.0)
MCV: 92.7 fL (ref 80.0–100.0)
Platelets: 278 K/uL (ref 150–400)
RBC: 3.42 MIL/uL — ABNORMAL LOW (ref 3.87–5.11)
RDW: 13.1 % (ref 11.5–15.5)
WBC: 9.3 K/uL (ref 4.0–10.5)
nRBC: 0 % (ref 0.0–0.2)

## 2024-01-20 LAB — COMPREHENSIVE METABOLIC PANEL WITH GFR
ALT: 10 U/L (ref 0–44)
AST: 18 U/L (ref 15–41)
Albumin: 2.7 g/dL — ABNORMAL LOW (ref 3.5–5.0)
Alkaline Phosphatase: 43 U/L (ref 38–126)
Anion gap: 8 (ref 5–15)
BUN: 8 mg/dL (ref 8–23)
CO2: 26 mmol/L (ref 22–32)
Calcium: 8.1 mg/dL — ABNORMAL LOW (ref 8.9–10.3)
Chloride: 103 mmol/L (ref 98–111)
Creatinine, Ser: 0.51 mg/dL (ref 0.44–1.00)
GFR, Estimated: >60 mL/min (ref >60)
Glucose, Bld: 97 mg/dL (ref 70–99)
Potassium: 3.4 mmol/L — ABNORMAL LOW (ref 3.5–5.1)
Sodium: 137 mmol/L (ref 135–145)
Total Bilirubin: 0.7 mg/dL (ref 0.0–1.2)
Total Protein: 5.4 g/dL — ABNORMAL LOW (ref 6.5–8.1)

## 2024-01-20 MED ORDER — TRAMADOL HCL 50 MG PO TABS
50.0000 mg | ORAL_TABLET | Freq: Four times a day (QID) | ORAL | 0 refills | Status: DC | PRN
Start: 2024-01-20 — End: 2024-05-02

## 2024-01-20 MED ORDER — NITROFURANTOIN MONOHYD MACRO 100 MG PO CAPS
100.0000 mg | ORAL_CAPSULE | Freq: Two times a day (BID) | ORAL | Status: AC
  Administered 2024-01-20 – 2024-01-23 (×8): 100 mg via ORAL
  Filled 2024-01-20 (×8): qty 1

## 2024-01-20 NOTE — Progress Notes (Signed)
 Occupational Therapy Treatment Patient Details Name: Felicia Lowe MRN: [id number] DOB: [birth year] Today's Date: 01/20/2024   History of present illness 83 y.o. female  here for evaluation of acute-on-chronic right shoulder pain. She had a fall while at home on 07/06, with acute onset of severe right shoulder pain. Pt now s/p R rTSA on 01/18/24. PMH: OA, HTN, scabies, bil TKAs   OT comments  Patient seen for skilled OT session this am. Daughter bedside for education with patient already up in arm chair upon OT arrival with R sling and ice man pad in place. Safety and R UE Reverse TSA protocol education ongoing and ADL and mobility assist and progression as below with significantly less pain with proper positoning and cold therapy. Initiated distal R UE elbow, wrist and hand exercise program with instruction to carryover 3x per day between visits with written HEP outlined. OT will continue to progress while in Acute hospital setting. Patient will benefit from continued inpatient follow up therapy, <3 hours/day.        If plan is discharge home, recommend the following:  A lot of help with bathing/dressing/bathroom;Assistance with cooking/housework;Assist for transportation;Help with stairs or ramp for entrance   Equipment Recommendations  None recommended by OT;Other (comment) (TA followign rehab venue)       Precautions / Restrictions Precautions Precautions: Fall;Shoulder Type of Shoulder Precautions: AROM elbow, wrist and hand to tolerance; No PROM of shoulder, No AROM of shoulder Shoulder Interventions: Shoulder sling/immobilizer Precaution Booklet Issued: Yes (comment) (Handout issued and instructed in) Precaution/Restrictions Comments: sling on at all times except ADLs and exercise Required Braces or Orthoses: Sling Restrictions Weight Bearing Restrictions Per Provider Order: Yes RUE Weight Bearing Per Provider Order: Non weight bearing       Mobility Bed Mobility Overal bed  mobility: Needs Assistance Bed Mobility: Sit to Supine       Sit to supine: Min assist, HOB elevated, Used rails   General bed mobility comments: cues for positioning R UE and L UE use only    Transfers Overall transfer level: Needs assistance Equipment used: 1 person hand held assist Transfers: Sit to/from Stand, Bed to chair/wheelchair/BSC Sit to Stand: Min assist     Step pivot transfers: Min assist     General transfer comment: amb with min A 10 ft to and from bathroom and standing breifly sink side with HHA for balance on L side     Balance Overall balance assessment: Needs assistance, History of Falls Sitting-balance support: Feet supported, No upper extremity supported Sitting balance-Leahy Scale: Fair     Standing balance support: During functional activity, Single extremity supported Standing balance-Leahy Scale: Poor Standing balance comment: reliant on external assist and UE support. posterior lean on intial standing                           ADL either performed or assessed with clinical judgement   ADL Overall ADL's : Needs assistance/impaired Eating/Feeding: Set up;Sitting   Grooming: Wash/dry hands;Wash/dry face;Oral care;Minimal assistance;Standing;Sitting Grooming Details (indicate cue type and reason): sink side and EOB with L UE only Upper Body Bathing: Moderate assistance;Sitting       Upper Body Dressing : Maximal assistance;Sitting;Cueing for UE precautions   Lower Body Dressing: Moderate assistance;Sit to/from stand   Toilet Transfer: BSC/3in1;Minimal assistance   Toileting- Architect and Hygiene: Moderate assistance;Sitting/lateral lean Toileting - Clothing Manipulation Details (indicate cue type and reason): with L UE only  Functional mobility during ADLs: Minimal assistance General ADL Comments: educated on compensatory strategies for ADL's with L hand use only, hemi dressing and bathing techniques while  following strict R Reverese TSA protocol with sling and ice man in place    Extremity/Trunk Assessment Upper Extremity Assessment Upper Extremity Assessment: Left hand dominant;RUE deficits/detail RUE Deficits / Details: distal R elbow, wrist, hand AAROM/AROM WFL's, R shoulder immobile as per MD protocol RUE Coordination: decreased fine motor;decreased gross motor LUE Deficits / Details: WNL's   Lower Extremity Assessment Lower Extremity Assessment: Overall WFL for tasks assessed        Vision   Vision Assessment?: No apparent visual deficits;Wears glasses for reading         Communication Communication Communication: No apparent difficulties   Cognition Arousal: Alert Behavior During Therapy: WFL for tasks assessed/performed Cognition: No apparent impairments                               Following commands: Intact        Cueing   Cueing Techniques: Verbal cues  Exercises Exercises: Shoulder Shoulder Exercises Elbow Flexion: 10 reps, AAROM Elbow Extension: 10 reps, AAROM Wrist Flexion: 10 reps, AROM Wrist Extension: 10 reps, AROM Digit Composite Flexion: 10 reps, AROM Composite Extension: 10 reps, AROM    Shoulder Instructions Shoulder Instructions Donning/doffing shirt without moving shoulder: Maximal assistance Method for sponge bathing under operated UE: Maximal assistance Donning/doffing sling/immobilizer: Maximal assistance Correct positioning of sling/immobilizer: Moderate assistance ROM for elbow, wrist and digits of operated UE: Minimal assistance Sling wearing schedule (on at all times/off for ADL's): Minimal assistance Proper positioning of operated UE when showering: Minimal assistance Positioning of UE while sleeping: Minimal assistance     General Comments mild R UE edema, post op dressing in place, ice man wrap in place and sling removed for elbow,wrist, hand ROM on R only then replaced and propped on pillow    Pertinent Vitals/  Pain       Pain Assessment Pain Assessment: 0-10 Pain Score: 2  Pain Descriptors / Indicators: Sore, Guarding Pain Intervention(s): Ice applied, Relaxation, Repositioned, Premedicated before session, Monitored during session, Limited activity within patient's tolerance   Frequency  Min 2X/week        Progress Toward Goals  OT Goals(current goals can now be found in the care plan section)  Progress towards OT goals: Progressing toward goals  Acute Rehab OT Goals OT Goal Formulation: With patient/family Time For Goal Achievement: 02/02/24 Potential to Achieve Goals: Good ADL Goals Pt Will Perform Upper Body Dressing: with min assist Pt Will Perform Lower Body Dressing: with supervision;sit to/from stand;sitting/lateral leans Pt Will Transfer to Toilet: with supervision;ambulating Pt Will Perform Toileting - Clothing Manipulation and hygiene: with supervision;sit to/from stand  Plan         AM-PAC OT 6 Clicks Daily Activity     Outcome Measure   Help from another person eating meals?: A Little Help from another person taking care of personal grooming?: A Little Help from another person toileting, which includes using toliet, bedpan, or urinal?: A Lot Help from another person bathing (including washing, rinsing, drying)?: A Lot Help from another person to put on and taking off regular upper body clothing?: A Lot Help from another person to put on and taking off regular lower body clothing?: A Lot 6 Click Score: 14    End of Session Equipment Utilized During Treatment: Gait belt  OT  Visit Diagnosis: Muscle weakness (generalized) (M62.81);History of falling (Z91.81);Unsteadiness on feet (R26.81);Pain Pain - Right/Left: Right Pain - part of body: Shoulder   Activity Tolerance Patient limited by pain   Patient Left in bed;with call bell/phone within reach;with bed alarm set;with family/visitor present   Nurse Communication Mobility status;Precautions;Weight bearing  status        Time: 9049-8964 OT Time Calculation (min): 45 min  Charges: OT General Charges $OT Visit: 1 Visit OT Treatments $Self Care/Home Management : 8-22 mins $Therapeutic Activity: 8-22 mins $Therapeutic Exercise: 8-22 mins  Naim Murtha OT/L Acute Rehabilitation Department  [phone]  01/20/2024, 12:50 PM

## 2024-01-20 NOTE — NC FL2 (Signed)
 [place]  MEDICAID FL2 LEVEL OF CARE FORM     IDENTIFICATION  Patient Name: Felicia Lowe Birthdate: 12-30-1940 Sex: female Admission Date (Current Location): 01/18/2024  [facility] and [place] Number:  Producer, television/film/video and Address:  [facility],  [address] 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   Leukocytosis 11/07/2022   Hypocalcemia 11/07/2022   SBO (small bowel obstruction) (HCC) 10/20/2022   Hypothyroidism 10/20/2022   Hypokalemia 10/20/2022   Low back pain 04/04/2019   Hyperlipidemia 05/30/2018   Osteopenia 05/30/2018   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   Hyperglycemia 05/25/2017   Atypical chest pain 04/04/2017   Pedal edema 04/04/2017   Preventative health care 11/25/2014   Unsteady gait 11/25/2014   Dizziness 09/10/2014   GERD (gastroesophageal reflux disease) 05/27/2014   Dermatitis 01/21/2014   Allergic drug rash 03/17/2013   Intertrigo 03/17/2013   BPV (benign positional vertigo) 02/19/2013   Urinary tract infection without hematuria 12/11/2012   Medicare annual wellness visit, subsequent 02/08/2012   Thyroid  disease    Essential hypertension    Vitamin D  deficiency    Mumps    Kidney stone    Obesity    Allergy     Arthritis    H/O breast biopsy     Orientation RESPIRATION BLADDER Height & Weight     Self, Time, Situation, Place  Normal Continent Weight: 145 lb (65.8 kg) Height:  5'  2 (157.5 cm)  BEHAVIORAL SYMPTOMS/MOOD NEUROLOGICAL BOWEL NUTRITION STATUS      Continent Diet (Regular)  AMBULATORY STATUS COMMUNICATION OF NEEDS Skin   Limited Assist Verbally Other (Comment) (sugical incision only)                       Personal Care Assistance Level of Assistance  Bathing, Feeding, Dressing Bathing Assistance: Limited assistance Feeding assistance: Limited assistance Dressing Assistance: Limited assistance     Functional Limitations Info  Sight, Hearing, Speech Sight Info: Adequate Hearing Info: Adequate Speech Info: Adequate    SPECIAL CARE FACTORS FREQUENCY  PT (By licensed PT), OT (By licensed OT)     PT Frequency: 5x/wk OT Frequency: 5x/wk            Contractures Contractures Info: Not present    Additional Factors Info  Allergies, Code Status Code Status Info: Full Allergies Info: Egg-derived Products, Sulfa Antibiotics, Keflex  (Cephalexin ), Penicillins, Poison Ivy Extract (Poison Ivy Extract)           Current Medications (01/20/2024):  This is the current hospital active medication list Current Facility-Administered Medications  Medication Dose Route Frequency Provider Last Rate Last Admin   acetaminophen  (TYLENOL ) tablet 325-650 mg  325-650 mg Oral Q6H PRN Brown, Blaine K, PA-C   325 mg at 01/20/24 0017   alum & mag hydroxide-simeth (MAALOX/MYLANTA) 200-200-20 MG/5ML suspension 30 mL  30 mL Oral Q4H PRN Brown, Blaine K, PA-C       aspirin  tablet 325 mg  325 mg Oral BID Brown, Blaine K, PA-C   325 mg at 01/20/24 0014   bisacodyl  (DULCOLAX) suppository 10 mg  10 mg Rectal Daily PRN Brown, Blaine K, PA-C       diphenhydrAMINE  (BENADRYL ) 12.5 MG/5ML elixir 12.5-25 mg  12.5-25 mg Oral Q4H PRN Brown, Blaine K, PA-C       docusate sodium  (COLACE) capsule 100 mg  100 mg Oral BID Brown, Blaine K, PA-C   100 mg at 01/20/24 0014   HYDROcodone -acetaminophen  (NORCO/VICODIN) 5-325 MG per tablet 1 tablet  1 tablet Oral Q6H PRN Brown, Blaine K,  PA-C       levothyroxine  (SYNTHROID ) tablet 125 mcg  125 mcg Oral Q0600 Brown, Blaine K, PA-C   125 mcg at 01/20/24 9380   magnesium  citrate solution 1 Bottle  1 Bottle Oral Once PRN Brown, Blaine K, PA-C       menthol -cetylpyridinium (CEPACOL) lozenge 3 mg  1 lozenge Oral PRN Brown, Blaine K, PA-C       Or   phenol (CHLORASEPTIC) mouth spray 1 spray  1 spray Mouth/Throat PRN Brown, Blaine K, PA-C       metoCLOPramide  (REGLAN ) tablet 5-10 mg  5-10 mg Oral Q8H PRN Brown, Blaine K, PA-C       Or   metoCLOPramide  (REGLAN ) injection 5-10 mg  5-10 mg Intravenous Q8H PRN Delores, Blaine K, PA-C       morphine  (PF) 2 MG/ML injection 0.5-1 mg  0.5-1 mg Intravenous Q2H PRN Delores, Blaine K, PA-C       ondansetron  (ZOFRAN ) tablet 4 mg  4 mg Oral Q6H PRN Brown, Blaine K, PA-C       Or   ondansetron  (ZOFRAN ) injection 4 mg  4 mg Intravenous Q6H PRN Delores, Blaine K, PA-C       polyethylene glycol (MIRALAX  / GLYCOLAX ) packet 17 g  17 g Oral Daily PRN Delores, Blaine K, PA-C       traMADol  (ULTRAM ) tablet 50 mg  50 mg Oral Q6H PRN Brown, Blaine K, PA-C   50 mg at 01/19/24 1755     Discharge Medications: Please see discharge summary for a list of discharge medications.  Relevant Imaging Results:  Relevant Lab Results:   Additional Information SS# [id number]  NORMAN ASPEN, LCSW

## 2024-01-20 NOTE — Plan of Care (Signed)
  Problem: Education: Goal: Knowledge of General Education information will improve Description: Including pain rating scale, medication(s)/side effects and non-pharmacologic comfort measures Outcome: Progressing   Problem: Health Behavior/Discharge Planning: Goal: Ability to manage health-related needs will improve Outcome: Progressing   Problem: Clinical Measurements: Goal: Ability to maintain clinical measurements within normal limits will improve Outcome: Progressing Goal: Will remain free from infection Outcome: Progressing Goal: Diagnostic test results will improve Outcome: Progressing Goal: Respiratory complications will improve Outcome: Progressing Goal: Cardiovascular complication will be avoided Outcome: Progressing   Problem: Activity: Goal: Risk for activity intolerance will decrease Outcome: Adequate for Discharge   Problem: Nutrition: Goal: Adequate nutrition will be maintained Outcome: Adequate for Discharge   Problem: Coping: Goal: Level of anxiety will decrease Outcome: Progressing   Problem: Elimination: Goal: Will not experience complications related to bowel motility Outcome: Progressing Goal: Will not experience complications related to urinary retention Outcome: Completed/Met   Problem: Pain Managment: Goal: General experience of comfort will improve and/or be controlled Outcome: Progressing   Problem: Safety: Goal: Ability to remain free from injury will improve Outcome: Progressing   Problem: Skin Integrity: Goal: Risk for impaired skin integrity will decrease Outcome: Progressing   Problem: Education: Goal: Knowledge of the prescribed therapeutic regimen will improve Outcome: Adequate for Discharge Goal: Understanding of activity limitations/precautions following surgery will improve Outcome: Progressing Goal: Individualized Educational Video(s) Outcome: Completed/Met   Problem: Activity: Goal: Ability to tolerate increased activity  will improve Outcome: Adequate for Discharge   Problem: Pain Management: Goal: Pain level will decrease with appropriate interventions Outcome: Progressing

## 2024-01-20 NOTE — Progress Notes (Signed)
 Subjective: 2 Days Post-Op s/p Procedure(s): ARTHROPLASTY, SHOULDER, TOTAL, REVERSE   Patient is alert, oriented. Laying in bed in no acute distress. Pressure ulcer feels to be improving with dressing changes. Feels much better today due to adequate pain control and good night sleep.  No other complaints.    Objective:  PE: VITALS:   Vitals:   01/19/24 0936 01/19/24 1326 01/19/24 2002 01/20/24 0607  BP: 122/69 (!) 124/57 (!) 140/55 131/69  Pulse:  93 99 91  Resp: 16 18 17 16   Temp: 98.4 F (36.9 C) 98.7 F (37.1 C) 98.3 F (36.8 C) 98.4 F (36.9 C)  TempSrc: Oral  Oral Oral  SpO2: 100% 97% 97% 94%  Weight:      Height:       Sitting up in bed in no acute distress Neurovascular intact Sensation intact distally Intact pulses distally Incision: dressing C/D/I Able to flex, extend and abduct all fingers  LABS  Results for orders placed or performed during the hospital encounter of 01/18/24 (from the past 24 hours)  Basic metabolic panel     Status: Abnormal   Collection Time: 01/19/24 12:18 PM  Result Value Ref Range   Sodium 140 135 - 145 mmol/L   Potassium 3.1 (L) 3.5 - 5.1 mmol/L   Chloride 103 98 - 111 mmol/L   CO2 26 22 - 32 mmol/L   Glucose, Bld 122 (H) 70 - 99 mg/dL   BUN 8 8 - 23 mg/dL   Creatinine, Ser 9.31 0.44 - 1.00 mg/dL   Calcium 7.7 (L) 8.9 - 10.3 mg/dL   GFR, Estimated >39 >39 mL/min   Anion gap 11 5 - 15  CBC     Status: Abnormal   Collection Time: 01/20/24  3:20 AM  Result Value Ref Range   WBC 9.3 4.0 - 10.5 K/uL   RBC 3.42 (L) 3.87 - 5.11 MIL/uL   Hemoglobin 10.4 (L) 12.0 - 15.0 g/dL   HCT 68.2 (L) 63.9 - 53.9 %   MCV 92.7 80.0 - 100.0 fL   MCH 30.4 26.0 - 34.0 pg   MCHC 32.8 30.0 - 36.0 g/dL   RDW 86.8 88.4 - 84.4 %   Platelets 278 150 - 400 K/uL   nRBC 0.0 0.0 - 0.2 %    DG Shoulder Right Port Result Date: 01/18/2024 CLINICAL DATA:  [id number] S/P reverse total shoulder arthroplasty, right [id number] EXAM: RIGHT SHOULDER - 1 VIEW  COMPARISON:  CT right shoulder 01/14/2024 FINDINGS: Status post total reverse right shoulder arthroplasty. Redemonstration of underlying acute proximal humeral fracture. There is no evidence of new acute fracture or dislocation. There is no evidence of arthropathy or other focal bone abnormality. Right shoulder subcutaneus soft tissue edema and emphysema consistent with postsurgical changes. IMPRESSION: 1. Status post total reverse right shoulder arthroplasty. 2. Redemonstration of underlying acute proximal humeral fracture. Electronically Signed   By: Morgane  Naveau M.D.   On: 01/18/2024 19:39    Assessment/Plan: Principal Problem:   S/P reverse total shoulder arthroplasty, right Active Problems:   Fracture of right shoulder   Pressure injury of skin  2 Days Post-Op s/p Procedure(s): ARTHROPLASTY, SHOULDER, TOTAL, REVERSE - encouraged patient to use incentive spirometry and to get up and walk with staff or get to chair as much as she is able.  Weightbearing: NWB RUE Insicional and dressing care: Reinforce dressings as needed Orthopedic device(s): sling in place at all times, ice machine prn VTE prophylaxis: aspirin  while inpatient Pain control: continue  current regimen, limit narcotics as much as possible Follow - up plan: 2 weeks with Dr. Josefina Dispo: Patient does not have safe discharge plan, lives at home with husband who is unable to help her after surgery. Will need SNF placement, TOC on board.   Meds printed and in chart.   Contact information:   Army Daring, DEVONNA Tzzxijbd 8-5  After hours and holidays please check [email or web address] for group call information for [facility]  Army MARLA Daring 01/20/2024, 7:00 AM

## 2024-01-20 NOTE — Plan of Care (Signed)
  Problem: Health Behavior/Discharge Planning: Goal: Ability to manage health-related needs will improve Outcome: Progressing   Problem: Clinical Measurements: Goal: Ability to maintain clinical measurements within normal limits will improve Outcome: Adequate for Discharge Goal: Will remain free from infection Outcome: Progressing Goal: Diagnostic test results will improve Outcome: Progressing Goal: Respiratory complications will improve Outcome: Adequate for Discharge   Problem: Activity: Goal: Risk for activity intolerance will decrease Outcome: Adequate for Discharge   Problem: Nutrition: Goal: Adequate nutrition will be maintained Outcome: Completed/Met   Problem: Coping: Goal: Level of anxiety will decrease Outcome: Progressing   Problem: Elimination: Goal: Will not experience complications related to bowel motility Outcome: Progressing   Problem: Pain Managment: Goal: General experience of comfort will improve and/or be controlled Outcome: Adequate for Discharge   Problem: Safety: Goal: Ability to remain free from injury will improve Outcome: Progressing   Problem: Skin Integrity: Goal: Risk for impaired skin integrity will decrease Outcome: Adequate for Discharge   Problem: Education: Goal: Knowledge of the prescribed therapeutic regimen will improve Outcome: Progressing   Problem: Activity: Goal: Ability to tolerate increased activity will improve Outcome: Adequate for Discharge   Problem: Pain Management: Goal: Pain level will decrease with appropriate interventions Outcome: Adequate for Discharge

## 2024-01-20 NOTE — TOC Progression Note (Signed)
 Transition of Care [facility]) - Progression Note    Patient Details  Name: Felicia Lowe MRN: [id number] Date of Birth: 05-29-1941  [facility] CM/SW Contact  NORMAN ASPEN, LCSW Phone Number: 01/20/2024, 1:19 PM  Clinical Narrative:     Have reviewed SNF bed offers with pt and daughter and they have accepted bed at [facility] who can admit patient tomorrow.  Have started insurance authorization and hopeful to have this by end of day/ tomorrow.  Have alerted MD/PA via chat.  Scripts for pain meds already on chart but will need dc summary when dc confirmed.    Expected Discharge Plan: Skilled Nursing Facility Barriers to Discharge: Continued Medical Work up, English as a second language teacher, SNF Pending bed offer  Expected Discharge Plan and Services In-house Referral: Clinical Social Work     Living arrangements for the past 2 months: Single Family Home                                       Social Determinants of Health (SDOH) Interventions SDOH Screenings   Food Insecurity: No Food Insecurity (01/18/2024)  Housing: Low Risk  (01/18/2024)  Transportation Needs: No Transportation Needs (01/18/2024)  Utilities: Not At Risk (01/18/2024)  Alcohol Screen: Low Risk  (03/18/2023)  Depression (PHQ2-9): Low Risk  (03/18/2023)  Financial Resource Strain: Low Risk  (03/18/2023)  Physical Activity: Inactive (03/18/2023)  Social Connections: Socially Integrated (01/18/2024)  Stress: No Stress Concern Present (03/18/2023)  Tobacco Use: Low Risk  (01/18/2024)  Health Literacy: Adequate Health Literacy (03/18/2023)    Readmission Risk Interventions     No data to display

## 2024-01-21 ENCOUNTER — Other Ambulatory Visit (HOSPITAL_COMMUNITY): Payer: Self-pay

## 2024-01-21 LAB — CBC
HCT: 31.8 % — ABNORMAL LOW (ref 36.0–46.0)
Hemoglobin: 10.8 g/dL — ABNORMAL LOW (ref 12.0–15.0)
MCH: 30.9 pg (ref 26.0–34.0)
MCHC: 34 g/dL (ref 30.0–36.0)
MCV: 90.9 fL (ref 80.0–100.0)
Platelets: 284 K/uL (ref 150–400)
RBC: 3.5 MIL/uL — ABNORMAL LOW (ref 3.87–5.11)
RDW: 13.1 % (ref 11.5–15.5)
WBC: 7.3 K/uL (ref 4.0–10.5)
nRBC: 0 % (ref 0.0–0.2)

## 2024-01-21 MED ORDER — NITROFURANTOIN MONOHYD MACRO 100 MG PO CAPS
100.0000 mg | ORAL_CAPSULE | Freq: Two times a day (BID) | ORAL | 0 refills | Status: AC
  Filled 2024-01-21: qty 8, 4d supply, fill #0

## 2024-01-21 NOTE — Progress Notes (Signed)
     Subjective: 3 Days Post-Op s/p Procedure(s): ARTHROPLASTY, SHOULDER, TOTAL, REVERSE  Feels much better today. Sling in better position. Slept well. No other complaints.    Objective:  PE: VITALS:   Vitals:   01/20/24 0607 01/20/24 1334 01/20/24 2256 01/21/24 0458  BP: 131/69 (!) 128/57 (!) 110/58 (!) 148/85  Pulse: 91 98 95 85  Resp: 16 16 16 16   Temp: 98.4 F (36.9 C) 98.6 F (37 C) 99.2 F (37.3 C) 98.1 F (36.7 C)  TempSrc: Oral Oral    SpO2: 94% 96% 95% 97%  Weight:      Height:       Sitting up in bed in no acute distress Neurovascular intact Sensation intact distally Intact pulses distally Incision: dressing C/D/I Able to flex, extend and abduct all fingers  LABS  Results for orders placed or performed during the hospital encounter of 01/18/24 (from the past 24 hours)  CBC     Status: Abnormal   Collection Time: 01/21/24  3:14 AM  Result Value Ref Range   WBC 7.3 4.0 - 10.5 K/uL   RBC 3.50 (L) 3.87 - 5.11 MIL/uL   Hemoglobin 10.8 (L) 12.0 - 15.0 g/dL   HCT 68.1 (L) 63.9 - 53.9 %   MCV 90.9 80.0 - 100.0 fL   MCH 30.9 26.0 - 34.0 pg   MCHC 34.0 30.0 - 36.0 g/dL   RDW 86.8 88.4 - 84.4 %   Platelets 284 150 - 400 K/uL   nRBC 0.0 0.0 - 0.2 %    No results found.   Assessment/Plan: Principal Problem:   S/P reverse total shoulder arthroplasty, right Active Problems:   Fracture of right shoulder   Pressure injury of skin  3 Days Post-Op s/p Procedure(s): ARTHROPLASTY, SHOULDER, TOTAL, REVERSE - encouraged patient to use incentive spirometry and to get up and walk with staff or get to chair as much as she is able.  Weightbearing: NWB RUE Insicional and dressing care: Reinforce dressings as needed Orthopedic device(s): sling in place at all times, ice machine prn VTE prophylaxis: aspirin  while inpatient Pain control: continue current regimen, limit narcotics as much as possible Follow - up plan: 2 weeks with Dr. Josefina Dispo: Patient does not  have safe discharge plan, lives at home with husband who is unable to help her after surgery. Will need SNF placement, TOC on board. Likely discharge to SNF today.   Meds printed and in chart.   Contact information:   After hours and holidays please check [email or web address] for group call information for [facility]  Aleck LOISE Stalling 01/21/2024, 8:37 AM

## 2024-01-21 NOTE — Progress Notes (Signed)
 Occupational Therapy Treatment Patient Details Name: Felicia Lowe MRN: [id number] DOB: 1941/05/10 Today's Date: 01/21/2024   History of present illness 83 yr old female here for evaluation of acute-on-chronic right shoulder pain. She had a fall while at home on 07/06, with acute onset of severe right shoulder pain. Found to have a R proximal humerus fracture with rotator cuff tear. Pt is now s/p a R TSA on 01/18/24. PMH: OA, HTN, scabies, bilateral TKA   OT comments  The pt presented with good effort and participation in the session. She required min assist for supine to sit and max assist for upper body dressing, as she required increased assistance to donn/doff RUE sling and ice man shoulder pad. OT further instructed her RUE elbow, wrist, and hand ROM protocol, with the pt providing appropriate teach back with supervision seated EOB. OT also reinforced RUE NWB status, sling wear schedule, and no shoulder ROM. Pt presented with good understanding. Continue OT plan of care. Patient will benefit from continued inpatient follow up therapy, <3 hours/day.       If plan is discharge home, recommend the following:  A lot of help with bathing/dressing/bathroom;Assistance with cooking/housework;Assist for transportation;Help with stairs or ramp for entrance   Equipment Recommendations  Other (comment) (to be determined pending progress at next level of care)    Recommendations for Other Services      Precautions / Restrictions Precautions Precautions: Fall;Shoulder Type of Shoulder Precautions: AROM elbow, wrist and hand to tolerance; No PROM of shoulder, No AROM of shoulder Shoulder Interventions: Shoulder sling/immobilizer Precaution Booklet Issued: Yes (comment) Precaution/Restrictions Comments: sling on at all times except ADLs and exercise Required Braces or Orthoses: Sling Restrictions Weight Bearing Restrictions Per Provider Order: Yes RUE Weight Bearing Per Provider Order: Non weight  bearing       Mobility Bed Mobility Overal bed mobility: Needs Assistance Bed Mobility: Supine to Sit     Supine to sit: Min assist, HOB elevated          Transfers Overall transfer level: Needs assistance Equipment used: Straight cane (placed in LUE) Transfers: Sit to/from Stand Sit to Stand: Contact guard assist, From elevated surface                 Balance       Sitting balance - Comments:  (static sitting-good. dynamic sitting-fair+)       Standing balance comment: CGA with cane             ADL either performed or assessed with clinical judgement   ADL Overall ADL's : Needs assistance/impaired     Grooming: Wash/dry face;Contact guard assist;Standing;Oral care Grooming Details (indicate cue type and reason): She ambulated to and from the bathroom in her room without an assistive device. She then performed face washing and teeth brushing in standing, requiring intermittent steadying assist.         Upper Body Dressing : Maximal assistance;Sitting;Cueing for UE precautions Upper Body Dressing Details (indicate cue type and reason): OT further instructed the pt on correctly doffing and donning RUE sling and shoulder ice machine pad. She required max assist in this regard.                        Extremity/Trunk Assessment Upper Extremity Assessment Upper Extremity Assessment: Left hand dominant RUE Deficits / Details: distal R elbow, wrist, hand AAROM/AROM WFL's, R shoulder immobile as per MD protocol RUE Coordination: decreased fine motor;decreased gross motor LUE Deficits /  Details: AROM and strength WFL            Communication Communication Communication: No apparent difficulties   Cognition Arousal: Alert Behavior During Therapy: WFL for tasks assessed/performed Cognition: No apparent impairments             OT - Cognition Comments: Oriented x4                 Following commands: Intact                       Pertinent Vitals/ Pain       Pain Assessment Pain Assessment: No/denies pain   Frequency  Min 2X/week        Progress Toward Goals  OT Goals(current goals can now be found in the care plan section)     Acute Rehab OT Goals OT Goal Formulation: With patient Time For Goal Achievement: 02/02/24 Potential to Achieve Goals: Good  Plan         AM-PAC OT 6 Clicks Daily Activity     Outcome Measure   Help from another person eating meals?: A Little Help from another person taking care of personal grooming?: A Little Help from another person toileting, which includes using toliet, bedpan, or urinal?: A Little Help from another person bathing (including washing, rinsing, drying)?: A Lot Help from another person to put on and taking off regular upper body clothing?: A Lot Help from another person to put on and taking off regular lower body clothing?: A Lot 6 Click Score: 15    End of Session Equipment Utilized During Treatment: Other (comment)  OT Visit Diagnosis: Muscle weakness (generalized) (M62.81);History of falling (Z91.81);Unsteadiness on feet (R26.81)   Activity Tolerance Patient tolerated treatment well   Patient Left in chair;with call bell/phone within reach   Nurse Communication Mobility status        Time: 1010-1035 OT Time Calculation (min): 25 min  Charges: OT General Charges $OT Visit: 1 Visit OT Treatments $Self Care/Home Management : 23-37 mins     [name], OTR/L 01/21/2024, 4:41 PM

## 2024-01-21 NOTE — TOC Progression Note (Addendum)
 Transition of Care [facility]) - Progression Note    Patient Details  Name: Felicia Lowe MRN: [id number] Date of Birth: 03-31-1941  [facility]) CM/SW Contact  Sonda Manuella Quill, RN Phone Number: 01/21/2024, 9:28 AM  Clinical Narrative:    Ins auth pending; certification # [id number]; awaiting medical review  -1144- ins auth still pending; awaiting medical review  -1339- called [employer] for update on status of ins auth ([phone]); spoke w/ Rep Particia; he says initial request for ins auth and additional clinicals received 01/20/24; he says shara is awaiting medical review, and system will update as soon as decision has been reached; Levon, Admissions at [employer]; she says pt can admit over weekend; LVM for pt's dtr Kate Richard at [phone]; awaiting return call.  -1423- pt's dtr Kate returned call; she was updated, and will await ins auth.  -1556- ins auth still pending; LVM for pt's dtr; awaiting return call  -1613- pt's dtr updated; she would like to transport pt to facility instead of using ambulance service; will pass on to oncoming TOC for follow up. Expected Discharge Plan: Skilled Nursing Facility Barriers to Discharge: Continued Medical Work up, English as a second language teacher, SNF Pending bed offer  Expected Discharge Plan and Services In-house Referral: Clinical Social Work     Living arrangements for the past 2 months: Single Family Home Expected Discharge Date: 01/21/24                                     Social Determinants of Health (SDOH) Interventions SDOH Screenings   Food Insecurity: No Food Insecurity (01/18/2024)  Housing: Low Risk  (01/18/2024)  Transportation Needs: No Transportation Needs (01/18/2024)  Utilities: Not At Risk (01/18/2024)  Alcohol Screen: Low Risk  (03/18/2023)  Depression (PHQ2-9): Low Risk  (03/18/2023)  Financial Resource Strain: Low Risk  (03/18/2023)  Physical Activity: Inactive (03/18/2023)  Social  Connections: Socially Integrated (01/18/2024)  Stress: No Stress Concern Present (03/18/2023)  Tobacco Use: Low Risk  (01/18/2024)  Health Literacy: Adequate Health Literacy (03/18/2023)    Readmission Risk Interventions     No data to display

## 2024-01-21 NOTE — Discharge Instructions (Addendum)
 Bonner Hair MD, MPH Aleck Stalling, PA-C [facility] [address] [phone] (tel)   [phone] (fax)   POST-OPERATIVE INSTRUCTIONS - SHOULDER ARTHROSCOPY  WOUND CARE You may remove the Operative Dressing on Post-Op Day #3 (72hrs after surgery).   Alternatively if you would like you can leave dressing on until follow-up if within 7-8 days but keep it dry. Leave steri-strips in place until they fall off on their own, usually 2 weeks postop. There may be a small amount of fluid/bleeding leaking at the surgical site.  You may change/reinforce the bandage as needed.  Use the Cryocuff or Ice as often as possible for the first 7 days, then as needed for pain relief. Always keep a towel, ACE wrap or other barrier between the cooling unit and your skin.  You may shower on Post-Op Day #3. Gently pat the area dry.  Do not soak the shoulder in water  or submerge it.  Keep incisions as dry as possible. Do not go swimming in the pool or ocean until 4 weeks after surgery or when otherwise instructed.    EXERCISES Wear the sling at all times  You may remove the sling for showering, but keep the arm across the chest or in a secondary sling.     It is normal for your fingers/hand to become more swollen after surgery and discolored from bruising.   This will resolve over the first few weeks usually after surgery. Please continue to ambulate and do not stay sitting or lying for too long.  Perform foot and wrist pumps to assist in circulation.  FOLLOW-UP If you develop a Fever (>=101.5), Redness or Drainage from the surgical incision site, please call our office to arrange for an evaluation. Please call the office to schedule a follow-up appointment for your first post-operative appointment, 7-10 days post-operatively.    HELPFUL INFORMATION   You may be more comfortable sleeping in a semi-seated position the first few nights following surgery.  Keep a pillow  propped under the elbow and forearm for comfort.  If you have a recliner type of chair it might be beneficial.  If not that is fine too, but it would be helpful to sleep propped up with pillows behind your operated shoulder as well under your elbow and forearm.  This will reduce pulling on the suture lines.  When dressing, put your operative arm in the sleeve first.  When getting undressed, take your operative arm out last.  Loose fitting, button-down shirts are recommended.  Often in the first days after surgery you may be more comfortable keeping your operative arm under your shirt and not through the sleeve.  You may return to work/school in the next couple of days when you feel up to it.  Desk work and typing in the sling is fine.  We suggest you use the pain medication the first night prior to going to bed, in order to ease any pain when the anesthesia wears off. You should avoid taking pain medications on an empty stomach as it will make you nauseous.  You should wean off your narcotic medicines as soon as you are able.  Most patients will be off narcotics before their first postop appointment.   Do not drink alcoholic beverages or take illicit drugs when taking pain medications.  It is against the law to drive while taking narcotics.  In some states it is against the law to drive while your arm is in a sling.  Pain medication may make you constipated.  Below are a few solutions to try in this order: Decrease the amount of pain medication if you aren't having pain. Drink lots of decaffeinated fluids. Drink prune juice and/or eat dried prunes  If the first 3 don't work start with additional solutions Take Colace - an over-the-counter stool softener Take Senokot - an over-the-counter laxative Take Miralax  - a stronger over-the-counter laxative  For more information including helpful videos and documents visit our website:   [email or web address]

## 2024-01-21 NOTE — Discharge Summary (Addendum)
 Patient ID: Felicia Lowe MRN: [id number] DOB/AGE: [birth year] 83 y.o.  Admit date: 01/18/2024 Discharge date: 01/24/2024  Admission Diagnoses: right proximal humerus fracture  Discharge Diagnoses:  Principal Problem:   S/P reverse total shoulder arthroplasty, right Active Problems:   Fracture of right shoulder   Pressure injury of skin   Past Medical History:  Diagnosis Date   Allergy     Arthritis    osteoarthritis, severe, b/l knees &b/l hips  replaced   BPV (benign positional vertigo) 02/19/2013   mild   Chicken pox as a child   Complication of anesthesia    B/P RUNS LOW DURING SURGERY   GERD (gastroesophageal reflux disease) 05/27/2014   H/O breast biopsy    benign, on left   History of kidney stones    Hypertension    Hypothyroidism    Kidney stone 2000   nephritis as a child   Measles as a child   Mumps as a child   Obesity    Osteoporosis    PONV (postoperative nausea and vomiting)    Scabies    Skin lesion of face 05/18/2012   Unsteady gait 11/25/2014   Vitamin D  deficiency      Procedures Performed: Right reverse Total Shoulder Arthroplasty with repair of lesser and greater tuberosity including open rotator cuff repair of chronic tear  Discharged Condition: stable  Hospital Course: Patient brought in for scheduled surgery.  She tolerated procedure well.  She was kept for monitoring overnight for pain control and medical monitoring postop. Due to lack of care at home, it was recommended patient to be discharged to SNF. Patient was found to be stable for DC to SNF on Friday once bed was available. Patient was instructed on specific activity restrictions and all questions were answered.  After further time had passed, her insurance company denied skilled nursing facility, despite the patient failing to be stable with independent physical therapy, however this was her baseline before she had a proximal humerus fracture, and after an extended family discussion  on Monday morning, the patient and the family have elected to be discharged home with home health and physical therapy for gait training, safety, activities of daily living training.  A majority of the concern after discussion was the fact that the daughter was concerned that the patient would be attempting to care for her 83 year old husband, and needed to honor her injury, and care for herself and protect herself.  They have arranged for some additional in-home support, and we will plan for discharge home today, July 21.  Consults: PT/OT  Significant Diagnostic Studies: No additional pertinent studies  Treatments: Surgery  Discharge Exam: Sitting up in bed in no acute distress Neurovascular intact Sensation intact distally Intact pulses distally Incision: dressing C/D/I Able to flex, extend and abduct all fingers  Disposition:     Allergies as of 01/24/2024       Reactions   Egg-derived Products Shortness Of Breath, Rash   Sulfa Antibiotics Nausea And Vomiting   Keflex  [cephalexin ] Rash      Penicillins Rash   Tolerated Ancef  for orthopedic procedure on 01/18/2024.   Poison Ivy Extract [poison Ivy Extract] Rash        Medication List     TAKE these medications    aspirin  81 MG tablet Take 81 mg by mouth daily.   Co Q 10 100 MG Caps Take 100 mg by mouth daily.   Fish Oil 1000 MG Cpdr Take 1,000 mg by mouth daily.  lisinopril -hydrochlorothiazide  10-12.5 MG tablet Commonly known as: ZESTORETIC  TAKE 1 TABLET BY MOUTH EVERY DAY   nitrofurantoin  (macrocrystal-monohydrate) 100 MG capsule Commonly known as: MACROBID  Take 1 capsule (100 mg total) by mouth 2 (two) times daily for 4 days.   PROBIOTIC DAILY PO Take 1 capsule by mouth daily.   Synthroid  125 MCG tablet Generic drug: levothyroxine  TAKE 1 TABLET (125 MCG TOTAL) BY MOUTH DAILY. SYNTHROID    traMADol  50 MG tablet Commonly known as: ULTRAM  Take 1 tablet (50 mg total) by mouth every 6 (six) hours as  needed for severe pain (pain score 7-10). What changed:  when to take this reasons to take this   vitamin C 1000 MG tablet Take 1,000 mg by mouth daily.   Vitamin D  50 MCG (2000 UT) tablet Take 2,000 Units by mouth daily.        Contact information for follow-up providers     Josefina Chew, MD. Schedule an appointment as soon as possible for a visit in 2 week(s).   Specialty: Orthopedic Surgery Contact information: [address] [place] [address] [phone]              Contact information for after-discharge care     Destination     [facility] .   Service: Skilled Nursing Contact information: [address] [place]  [address] [phone]                    Aleck Stalling, DEVONNA 01/21/24

## 2024-01-22 NOTE — TOC Progression Note (Signed)
 Transition of Care [facility]) - Progression Note    Patient Details  Name: Bernadene Garside MRN: [id number] Date of Birth: 01-Apr-1941  Transition of Care [facility]) CM/SW Contact  Heather DELENA Saltness, LCSW Phone Number: 01/22/2024, 1:11 PM  Clinical Narrative:    CSW checked [employer] portal, insurance authorization still pending. TOC will continue to follow.   Expected Discharge Plan: Skilled Nursing Facility Barriers to Discharge: Continued Medical Work up, English as a second language teacher, SNF Pending bed offer  Expected Discharge Plan and Services In-house Referral: Clinical Social Work     Living arrangements for the past 2 months: Single Family Home Expected Discharge Date: 01/21/24                                     Social Determinants of Health (SDOH) Interventions SDOH Screenings   Food Insecurity: No Food Insecurity (01/18/2024)  Housing: Low Risk  (01/18/2024)  Transportation Needs: No Transportation Needs (01/18/2024)  Utilities: Not At Risk (01/18/2024)  Alcohol Screen: Low Risk  (03/18/2023)  Depression (PHQ2-9): Low Risk  (03/18/2023)  Financial Resource Strain: Low Risk  (03/18/2023)  Physical Activity: Inactive (03/18/2023)  Social Connections: Socially Integrated (01/18/2024)  Stress: No Stress Concern Present (03/18/2023)  Tobacco Use: Low Risk  (01/18/2024)  Health Literacy: Adequate Health Literacy (03/18/2023)    Readmission Risk Interventions     No data to display          Heather Saltness, MSW, LCSW 01/22/2024 1:12 PM

## 2024-01-22 NOTE — Plan of Care (Signed)
   Problem: Clinical Measurements: Goal: Ability to maintain clinical measurements within normal limits will improve Outcome: Progressing Goal: Will remain free from infection Outcome: Progressing Goal: Diagnostic test results will improve Outcome: Progressing   Problem: Activity: Goal: Risk for activity intolerance will decrease Outcome: Progressing

## 2024-01-22 NOTE — Progress Notes (Signed)
 Occupational Therapy Treatment Patient Details Name: Felicia Lowe MRN: [id number] DOB: [birth year] Today's Date: 01/22/2024   History of present illness 83 yr old female here for evaluation of acute-on-chronic right shoulder pain. She had a fall while at home on 07/06, with acute onset of severe right shoulder pain. Found to have a R proximal humerus fracture with rotator cuff tear. Pt is now s/p a R TSA on 01/18/24. PMH: OA, HTN, scabies, bilateral TKA   OT comments  Pt progressing towards OT goals this session. Pt reports participation in RUE wrist and hand exercises frequently throughout the day. OT assisted with sling management and Pt performed elbow AROM. OT adjusted sling straps so Pt had access to velcro and provided continued education in don/doff of sling as well as UB dressing (R arm first so that shoulder does not move) Per Pt she does not wear socks normally, and one of her concerns is LB dressing and getting pants/shorts over feet to initiate. Pt able to transfer and ambulate with GCA and SPC in hallway. POC remains essential to continue education, exercises and maximize safety and independence in ADL and functional transfers. Next session bring AE for LB dressing while continuing exercises and other shoulder compensatory strategies      If plan is discharge home, recommend the following:  A lot of help with bathing/dressing/bathroom;Assistance with cooking/housework;Assist for transportation;Help with stairs or ramp for entrance   Equipment Recommendations  Other (comment) (defer to next venue of care)    Recommendations for Other Services      Precautions / Restrictions Precautions Precautions: Fall;Shoulder Type of Shoulder Precautions: AROM elbow, wrist and hand to tolerance; No PROM of shoulder, No AROM of shoulder Shoulder Interventions: Shoulder sling/immobilizer Precaution Booklet Issued: Yes (comment) Recall of Precautions/Restrictions: Intact Precaution/Restrictions  Comments: sling on at all times except ADLs and exercise Required Braces or Orthoses: Sling Restrictions Weight Bearing Restrictions Per Provider Order: Yes RUE Weight Bearing Per Provider Order: Non weight bearing       Mobility Bed Mobility               General bed mobility comments: OOB in recliner at beginning and end of session    Transfers Overall transfer level: Needs assistance Equipment used: Straight cane Transfers: Sit to/from Stand Sit to Stand: Contact guard assist           General transfer comment: rocking for momentum, good power up     Balance Overall balance assessment: Needs assistance, History of Falls Sitting-balance support: Feet supported, No upper extremity supported Sitting balance-Leahy Scale: Fair     Standing balance support: During functional activity, Single extremity supported Standing balance-Leahy Scale: Poor Standing balance comment: CGA with cane                           ADL either performed or assessed with clinical judgement   ADL Overall ADL's : Needs assistance/impaired                 Upper Body Dressing : Moderate assistance;Standing Upper Body Dressing Details (indicate cue type and reason): donning robe. re-educated in dressing R arm first and smart clothing choices Lower Body Dressing: Maximal assistance Lower Body Dressing Details (indicate cue type and reason): does not put on socks normally. just slip on shoes Toilet Transfer: Contact guard assist;Ambulation So [facility])           Functional mobility during ADLs: Contact guard assist;Cane General ADL Comments: reviewed  handout and compensatory strategies for ADL    Extremity/Trunk Assessment Upper Extremity Assessment Upper Extremity Assessment: Left hand dominant RUE Deficits / Details: distal R elbow, wrist, hand AAROM/AROM WFL's, R shoulder immobile as per MD protocol RUE Coordination: decreased gross motor LUE Deficits / Details: AROM and  strength WFL            Vision   Vision Assessment?: No apparent visual deficits;Wears glasses for reading   Perception     Praxis     Communication Communication Communication: No apparent difficulties   Cognition Arousal: Alert Behavior During Therapy: WFL for tasks assessed/performed Cognition: No apparent impairments                               Following commands: Intact        Cueing   Cueing Techniques: Verbal cues  Exercises Exercises: Shoulder Shoulder Exercises Elbow Flexion: AROM, Right, 10 reps, Seated Elbow Extension: AROM, Right, 10 reps, Seated Wrist Flexion: AROM, Right, Seated Wrist Extension: AROM, Right, Seated Digit Composite Flexion: AROM, Right Composite Extension: AROM, Right Neck Flexion: AROM (for comfort) Neck Extension:  (for comfort) Neck Lateral Flexion - Right: AROM (for comfort) Neck Lateral Flexion - Left: AROM (for comfort)    Shoulder Instructions Shoulder Instructions Donning/doffing shirt without moving shoulder: Moderate assistance Method for sponge bathing under operated UE: Moderate assistance Donning/doffing sling/immobilizer: Maximal assistance (adjusted velcro so she can access it) Correct positioning of sling/immobilizer: Patient able to independently direct caregiver ROM for elbow, wrist and digits of operated UE: Supervision/safety Sling wearing schedule (on at all times/off for ADL's): Independent     General Comments      Pertinent Vitals/ Pain       Pain Assessment Pain Assessment: No/denies pain Pain Intervention(s): Repositioned  Home Living                                          Prior Functioning/Environment              Frequency  Min 2X/week        Progress Toward Goals  OT Goals(current goals can now be found in the care plan section)  Progress towards OT goals: Progressing toward goals  Acute Rehab OT Goals OT Goal Formulation: With patient Time  For Goal Achievement: 02/02/24 Potential to Achieve Goals: Good  Plan      Co-evaluation                 AM-PAC OT 6 Clicks Daily Activity     Outcome Measure   Help from another person eating meals?: A Little Help from another person taking care of personal grooming?: A Little Help from another person toileting, which includes using toliet, bedpan, or urinal?: A Little Help from another person bathing (including washing, rinsing, drying)?: A Lot Help from another person to put on and taking off regular upper body clothing?: A Lot Help from another person to put on and taking off regular lower body clothing?: A Lot 6 Click Score: 15    End of Session Equipment Utilized During Treatment:  (SPC; R sling/immobilizer)  OT Visit Diagnosis: Muscle weakness (generalized) (M62.81);History of falling (Z91.81);Unsteadiness on feet (R26.81)   Activity Tolerance Patient tolerated treatment well   Patient Left in chair;with call bell/phone within reach   Nurse Communication Mobility status  Time: 9159-9077 OT Time Calculation (min): 42 min  Charges: OT General Charges $OT Visit: 1 Visit OT Treatments $Self Care/Home Management : 23-37 mins $Therapeutic Activity: 8-22 mins  Leita DEL OTR/L Acute Rehabilitation Services Office: [phone]   Leita PARAS [facility] 01/22/2024, 10:15 AM

## 2024-01-22 NOTE — Progress Notes (Signed)
     Subjective: 4 Days Post-Op s/p Procedure(s): ARTHROPLASTY, SHOULDER, TOTAL, REVERSE  Doing well this morning. Worked with OT yesterday. Out of bed she reports at least 3-4 hours yesterday. Plan for DC to [facility] pending bed.  Objective:  PE: VITALS:   Vitals:   01/21/24 0458 01/21/24 1418 01/21/24 2106 01/22/24 0524  BP: (!) 148/85 (!) 131/50 137/64 137/70  Pulse: 85 (!) 101 94 79  Resp: 16 20 15 15   Temp: 98.1 F (36.7 C) 98.3 F (36.8 C) 98.6 F (37 C) 97.7 F (36.5 C)  TempSrc:  Oral Oral Oral  SpO2: 97% 99% 97% 95%  Weight:      Height:       Sitting up in bed in no acute distress Neurovascular intact Sensation intact distally Intact pulses distally Incision: dressing C/D/I Able to flex, extend and abduct all fingers  LABS  No results found for this or any previous visit (from the past 24 hours).   No results found.   Assessment/Plan: Principal Problem:   S/P reverse total shoulder arthroplasty, right Active Problems:   Fracture of right shoulder   Pressure injury of skin  4 Days Post-Op s/p Procedure(s): ARTHROPLASTY, SHOULDER, TOTAL, REVERSE - encouraged patient to use incentive spirometry and to get up and walk with staff or get to chair as much as she is able.  Weightbearing: NWB RUE Insicional and dressing care: Reinforce dressings as needed Orthopedic device(s): sling in place at all times, ice machine prn VTE prophylaxis: aspirin  while inpatient Pain control: continue current regimen, limit narcotics as much as possible Follow - up plan: 2 weeks with Dr. Josefina Dispo: Patient does not have safe discharge plan, lives at home with husband who is unable to help her after surgery. Will need SNF placement, TOC on board. Likely discharge to SNF today.   Meds printed and in chart.   Contact information:   After hours and holidays please check [email or web address] for group call information for [facility]  Zayed Griffie A Milt Coye 01/22/2024,  7:10 AM

## 2024-01-22 NOTE — Plan of Care (Signed)
  Problem: Health Behavior/Discharge Planning: Goal: Ability to manage health-related needs will improve Outcome: Adequate for Discharge   Problem: Clinical Measurements: Goal: Ability to maintain clinical measurements within normal limits will improve Outcome: Adequate for Discharge Goal: Will remain free from infection Outcome: Progressing Goal: Diagnostic test results will improve Outcome: Progressing   Problem: Activity: Goal: Risk for activity intolerance will decrease Outcome: Adequate for Discharge   Problem: Coping: Goal: Level of anxiety will decrease Outcome: Progressing   Problem: Elimination: Goal: Will not experience complications related to bowel motility Outcome: Completed/Met   Problem: Pain Managment: Goal: General experience of comfort will improve and/or be controlled Outcome: Progressing   Problem: Safety: Goal: Ability to remain free from injury will improve Outcome: Progressing   Problem: Skin Integrity: Goal: Risk for impaired skin integrity will decrease Outcome: Progressing   Problem: Activity: Goal: Ability to tolerate increased activity will improve Outcome: Progressing   Problem: Pain Management: Goal: Pain level will decrease with appropriate interventions Outcome: Progressing

## 2024-01-22 NOTE — TOC Progression Note (Signed)
 Transition of Care [facility]) - Progression Note    Patient Details  Name: Felicia Lowe MRN: [id number] Date of Birth: 01-04-1941  [facility]) CM/SW Contact  Sonda Manuella Quill, RN Phone Number: 01/22/2024, 8:13 AM  Clinical Narrative:    Ins auth for SNF pending medical review.   Expected Discharge Plan: Skilled Nursing Facility Barriers to Discharge: Continued Medical Work up, English as a second language teacher, SNF Pending bed offer  Expected Discharge Plan and Services In-house Referral: Clinical Social Work     Living arrangements for the past 2 months: Single Family Home Expected Discharge Date: 01/21/24                                     Social Determinants of Health (SDOH) Interventions SDOH Screenings   Food Insecurity: No Food Insecurity (01/18/2024)  Housing: Low Risk  (01/18/2024)  Transportation Needs: No Transportation Needs (01/18/2024)  Utilities: Not At Risk (01/18/2024)  Alcohol Screen: Low Risk  (03/18/2023)  Depression (PHQ2-9): Low Risk  (03/18/2023)  Financial Resource Strain: Low Risk  (03/18/2023)  Physical Activity: Inactive (03/18/2023)  Social Connections: Socially Integrated (01/18/2024)  Stress: No Stress Concern Present (03/18/2023)  Tobacco Use: Low Risk  (01/18/2024)  Health Literacy: Adequate Health Literacy (03/18/2023)    Readmission Risk Interventions     No data to display

## 2024-01-23 NOTE — Plan of Care (Signed)
  Problem: Health Behavior/Discharge Planning: Goal: Ability to manage health-related needs will improve Outcome: Progressing   Problem: Clinical Measurements: Goal: Ability to maintain clinical measurements within normal limits will improve Outcome: Progressing   Problem: Pain Managment: Goal: General experience of comfort will improve and/or be controlled Outcome: Progressing

## 2024-01-23 NOTE — Plan of Care (Signed)
   Problem: Coping: Goal: Level of anxiety will decrease Outcome: Progressing   Problem: Pain Managment: Goal: General experience of comfort will improve and/or be controlled Outcome: Progressing   Problem: Safety: Goal: Ability to remain free from injury will improve Outcome: Progressing

## 2024-01-23 NOTE — Progress Notes (Signed)
 Physical Therapy Treatment Patient Details Name: Felicia Lowe MRN: [id number] DOB: Oct 09, 1940 Today's Date: 01/23/2024   History of Present Illness 83 yr old female here for evaluation of acute-on-chronic right shoulder pain. She had a fall while at home on 07/06, with acute onset of severe right shoulder pain. Found to have a R proximal humerus fracture with rotator cuff tear. Pt is now s/p a R TSA on 01/18/24. PMH: OA, HTN, scabies, bilateral TKA    PT Comments  Pt remains motivated and willing to work with PT. Progressing toward goals however still presents with higher level balance deficits, fatigues with dynamic gait activities.   Pt amb ~ 52' with SPC and min A to CGA for stability. For higher level balance activities, pt experiences LOB with min challenges and requires  min to mod assist to recover when amb over smooth level surfaces.  Pt scored 24/55 on BERG Balance Test  Score of <36 = High risk for falls (close to 100% likelihood of falling)    If plan is discharge home, recommend the following: A little help with walking and/or transfers;Assistance with cooking/housework;A lot of help with bathing/dressing/bathroom;Assist for transportation;Help with stairs or ramp for entrance   Can travel by private vehicle     No  Equipment Recommendations  None recommended by PT    Recommendations for Other Services       Precautions / Restrictions Precautions Precautions: Fall;Shoulder Type of Shoulder Precautions: AROM elbow, wrist and hand to tolerance; No PROM of shoulder, No AROM of shoulder Shoulder Interventions: Shoulder sling/immobilizer Recall of Precautions/Restrictions: Intact Precaution/Restrictions Comments: sling on at all times except ADLs and exercise Restrictions RUE Weight Bearing Per Provider Order: Non weight bearing     Mobility  Bed Mobility Overal bed mobility: Needs Assistance Bed Mobility: Sidelying to Sit, Sit to Sidelying Rolling: Contact guard  assist Sidelying to sit: Supervision     Sit to sidelying: Min assist General bed mobility comments: supervision for safety to come to sit, incr time bed flat;  assist to lift LEs on to be to return to supine    Transfers Overall transfer level: Needs assistance Equipment used: Straight cane, None Transfers: Sit to/from Stand Sit to Stand: Contact guard assist   Step pivot transfers: Contact guard assist       General transfer comment: CGA for safety    Ambulation/Gait Ambulation/Gait assistance: Contact guard assist, Min assist Gait Distance (Feet): 85 Feet (10' more in room) Assistive device: Straight cane Gait Pattern/deviations: Step-through pattern, Wide base of support Gait velocity: decr     General Gait Details: CGA for safety, LOB with challenges needing min assist to recover   Stairs             Wheelchair Mobility     Tilt Bed    Modified Rankin (Stroke Patients Only)       Balance Overall balance assessment: Needs assistance Sitting-balance support: No upper extremity supported, Feet supported Sitting balance-Leahy Scale: Fair     Standing balance support: During functional activity, Single extremity supported Standing balance-Leahy Scale: Fair Standing balance comment: reliant on device for             High level balance activites: Backward walking, Direction changes, Turns, Head turns, Side stepping High Level Balance Comments: min assist to CGA for higher level balance activities; LOB with min challenges requiring min to mod assist to recover when amb over smooth level surfaces Standardized Balance Assessment Standardized Balance Assessment : Lars Balance Test Lars  Balance Test Sit to Stand: Able to stand  independently using hands Standing Unsupported: Able to stand safely 2 minutes Sitting with Back Unsupported but Feet Supported on Floor or Stool: Able to sit safely and securely 2 minutes Stand to Sit: Controls descent by  using hands Transfers: Able to transfer with verbal cueing and /or supervision Standing Unsupported with Eyes Closed: Able to stand 10 seconds safely Standing Ubsupported with Feet Together: Needs help to attain position and unable to hold for 15 seconds From Standing, Reach Forward with Outstretched Arm: Loses balance while trying/requires external support From Standing Position, Pick up Object from Floor: Unable to try/needs assist to keep balance From Standing Position, Turn to Look Behind Over each Shoulder: Turn sideways only but maintains balance Turn 360 Degrees: Needs close supervision or verbal cueing Standing Unsupported, Alternately Place Feet on Step/Stool: Needs assistance to keep from falling or unable to try Standing Unsupported, One Foot in Front: Needs help to step but can hold 15 seconds Standing on One Leg: Unable to try or needs assist to prevent fall Total Score: 24        Communication Communication Communication: No apparent difficulties  Cognition Arousal: Alert Behavior During Therapy: WFL for tasks assessed/performed   PT - Cognitive impairments: No apparent impairments                         Following commands: Intact      Cueing Cueing Techniques: Verbal cues  Exercises      General Comments        Pertinent Vitals/Pain Pain Assessment Pain Assessment: No/denies pain Pain Intervention(s): Repositioned, Monitored during session    Home Living                          Prior Function            PT Goals (current goals can now be found in the care plan section) Acute Rehab PT Goals Patient Stated Goal: return to ind PT Goal Formulation: With patient Time For Goal Achievement: 02/02/24 Potential to Achieve Goals: Good Progress towards PT goals: Progressing toward goals    Frequency    Min 4X/week      PT Plan      Co-evaluation              AM-PAC PT 6 Clicks Mobility   Outcome Measure  Help  needed turning from your back to your side while in a flat bed without using bedrails?: A Little Help needed moving from lying on your back to sitting on the side of a flat bed without using bedrails?: A Little Help needed moving to and from a bed to a chair (including a wheelchair)?: A Little Help needed standing up from a chair using your arms (e.g., wheelchair or bedside chair)?: A Little Help needed to walk in hospital room?: A Little Help needed climbing 3-5 steps with a railing? : A Little 6 Click Score: 18    End of Session Equipment Utilized During Treatment: Gait belt;Other (comment) (sling) Activity Tolerance: Patient tolerated treatment well Patient left: in chair;with call bell/phone within reach;with family/visitor present Nurse Communication: Mobility status PT Visit Diagnosis: Other abnormalities of gait and mobility (R26.89)     Time: 8457-8376 PT Time Calculation (min) (ACUTE ONLY): 41 min  Charges:    $Gait Training: 38-52 mins PT General Charges $$ ACUTE PT VISIT: 1 Visit  Rexene, PT  Acute Rehab Dept [facility]) [phone]  01/23/2024    [facility] 01/23/2024, 4:47 PM

## 2024-01-23 NOTE — Progress Notes (Signed)
 Occupational Therapy Treatment Patient Details Name: Nishi Neiswonger MRN: [id number] DOB: 02-Oct-1940 Today's Date: 01/23/2024   History of present illness 83 yr old female here for evaluation of acute-on-chronic right shoulder pain. She had a fall while at home on 07/06, with acute onset of severe right shoulder pain. Found to have a R proximal humerus fracture with rotator cuff tear. Pt is now s/p a R TSA on 01/18/24. PMH: OA, HTN, scabies, bilateral TKA   OT comments  Pt up in recliner, eager to participate in OT.  Worked on R UE exercises and educated on AE for self care.  Educated on how to teach caregiver with donning/doffing sling, correct positioning for comfort.  Pt eager to get to rehab.  Will follow acutely.        If plan is discharge home, recommend the following:  A lot of help with bathing/dressing/bathroom;Assistance with cooking/housework;Assist for transportation;Help with stairs or ramp for entrance   Equipment Recommendations  Other (comment) (defer)    Recommendations for Other Services      Precautions / Restrictions Precautions Precautions: Fall;Shoulder Type of Shoulder Precautions: AROM elbow, wrist and hand to tolerance; No PROM of shoulder, No AROM of shoulder Shoulder Interventions: Shoulder sling/immobilizer Precaution Booklet Issued: Yes (comment) Recall of Precautions/Restrictions: Intact Precaution/Restrictions Comments: sling on at all times except ADLs and exercise Required Braces or Orthoses: Sling Restrictions Weight Bearing Restrictions Per Provider Order: Yes RUE Weight Bearing Per Provider Order: Non weight bearing       Mobility Bed Mobility               General bed mobility comments: OOB in recliner    Transfers                   General transfer comment: pt deferred as breakfast arrived     Balance Overall balance assessment: Needs assistance Sitting-balance support: No upper extremity supported, Feet  supported Sitting balance-Leahy Scale: Fair                                     ADL either performed or assessed with clinical judgement   ADL               Lower Body Bathing: Minimal assistance;Sitting/lateral leans Lower Body Bathing Details (indicate cue type and reason): educated on long sponge for LB bathing, pt reporting I have that at home but do not know how to use it     Lower Body Dressing: Moderate assistance;Sitting/lateral leans Lower Body Dressing Details (indicate cue type and reason): educated on reacher for donning underwear/pants, pt declined education on socks.               General ADL Comments: reviewed sling mgmt and exercises    Extremity/Trunk Assessment Upper Extremity Assessment Upper Extremity Assessment: Left hand dominant RUE Deficits / Details: distal R elbow, wrist, hand AAROM/AROM WFL's, R shoulder immobile as per MD protocol RUE Coordination: decreased gross motor            Vision       Perception     Praxis     Communication Communication Communication: No apparent difficulties   Cognition Arousal: Alert Behavior During Therapy: WFL for tasks assessed/performed Cognition: No apparent impairments  Following commands: Intact        Cueing   Cueing Techniques: Verbal cues  Exercises Exercises: Shoulder Shoulder Exercises Elbow Flexion: AROM, Right, 10 reps, Seated Elbow Extension: AROM, Right, 10 reps, Seated Wrist Flexion: AROM, Right, Seated Wrist Extension: AROM, Right, Seated Digit Composite Flexion: AROM, Right Composite Extension: AROM, Right    Shoulder Instructions Shoulder Instructions Donning/doffing sling/immobilizer: Moderate assistance (pt educated on positioning and how to teach caregiver with donning/doffing) Correct positioning of sling/immobilizer: Patient able to independently direct caregiver     General Comments      Pertinent  Vitals/ Pain       Pain Assessment Pain Assessment: No/denies pain Pain Intervention(s): Repositioned  Home Living                                          Prior Functioning/Environment              Frequency  Min 2X/week        Progress Toward Goals  OT Goals(current goals can now be found in the care plan section)  Progress towards OT goals: Progressing toward goals  Acute Rehab OT Goals OT Goal Formulation: With patient Time For Goal Achievement: 02/02/24 Potential to Achieve Goals: Good  Plan      Co-evaluation                 AM-PAC OT 6 Clicks Daily Activity     Outcome Measure   Help from another person eating meals?: A Little Help from another person taking care of personal grooming?: A Little Help from another person toileting, which includes using toliet, bedpan, or urinal?: A Little Help from another person bathing (including washing, rinsing, drying)?: A Lot Help from another person to put on and taking off regular upper body clothing?: A Lot Help from another person to put on and taking off regular lower body clothing?: A Lot 6 Click Score: 15    End of Session Equipment Utilized During Treatment: Other (comment) (R sling)  OT Visit Diagnosis: Muscle weakness (generalized) (M62.81);History of falling (Z91.81);Unsteadiness on feet (R26.81)   Activity Tolerance Patient tolerated treatment well   Patient Left in chair;with call bell/phone within reach   Nurse Communication Mobility status        Time: 9191-9176 OT Time Calculation (min): 15 min  Charges: OT General Charges $OT Visit: 1 Visit OT Treatments $Self Care/Home Management : 8-22 mins  Etta NOVAK, OT Acute Rehabilitation Services Office [phone] Secure Chat Preferred    Etta GORMAN Hope 01/23/2024, 9:00 AM

## 2024-01-23 NOTE — Plan of Care (Signed)
  Problem: Clinical Measurements: Goal: Ability to maintain clinical measurements within normal limits will improve Outcome: Progressing   Problem: Activity: Goal: Risk for activity intolerance will decrease Outcome: Progressing   Problem: Coping: Goal: Level of anxiety will decrease Outcome: Progressing   Problem: Pain Managment: Goal: General experience of comfort will improve and/or be controlled Outcome: Progressing   Problem: Safety: Goal: Ability to remain free from injury will improve Outcome: Progressing   Problem: Activity: Goal: Ability to tolerate increased activity will improve Outcome: Progressing   Problem: Pain Management: Goal: Pain level will decrease with appropriate interventions Outcome: Progressing

## 2024-01-23 NOTE — Progress Notes (Signed)
 Mobility Specialist - Progress Note   01/23/24 1251  Mobility  Activity Ambulated with assistance in hallway  Level of Assistance Contact guard assist, steadying assist  Assistive Device Cane  Distance Ambulated (ft) 160 ft  RUE Weight Bearing Per Provider Order NWB  Activity Response Tolerated well  Mobility Referral Yes  Mobility visit 1 Mobility  Mobility Specialist Start Time (ACUTE ONLY) 1243  Mobility Specialist Stop Time (ACUTE ONLY) 1251  Mobility Specialist Time Calculation (min) (ACUTE ONLY) 8 min   Pt received in recliner and agreeable to mobility. No complaints during session. Pt to recliner after session with all needs met.    [facility]

## 2024-01-23 NOTE — Progress Notes (Signed)
     Subjective: 5 Days Post-Op s/p Procedure(s): ARTHROPLASTY, SHOULDER, TOTAL, REVERSE  Doing well this morning.  Plan for DC to [facility] pending insurance authorization.   Objective:  PE: VITALS:   Vitals:   01/22/24 0524 01/22/24 1404 01/22/24 2114 01/23/24 0518  BP: 137/70 117/87 (!) 121/37 (!) 149/65  Pulse: 79 95 98 81  Resp: 15 18 15 15   Temp: 97.7 F (36.5 C) 99.3 F (37.4 C) 98.3 F (36.8 C) 98 F (36.7 C)  TempSrc: Oral Oral Oral Oral  SpO2: 95% 100% 97% 96%  Weight:      Height:       Sitting up in recliner in no acute distress Neurovascular intact Sensation intact distally Intact pulses distally Incision: dressing C/D/I Able to flex, extend and abduct all fingers  LABS  No results found for this or any previous visit (from the past 24 hours).   No results found.   Assessment/Plan: Principal Problem:   S/P reverse total shoulder arthroplasty, right Active Problems:   Fracture of right shoulder   Pressure injury of skin  5 Days Post-Op s/p Procedure(s): ARTHROPLASTY, SHOULDER, TOTAL, REVERSE - encouraged patient to use incentive spirometry and to get up and walk with staff or get to chair as much as she is able.  Weightbearing: NWB RUE Insicional and dressing care: Reinforce dressings as needed Orthopedic device(s): sling in place at all times, ice machine prn VTE prophylaxis: aspirin  while inpatient Pain control: continue current regimen, limit narcotics as much as possible Follow - up plan: 2 weeks with Dr. Josefina Dispo: Patient does not have safe discharge plan, lives at home with husband who is unable to help her after surgery. Has bed available but waiting on insurance authorization. Okay to discharge once insurance approval comes through.   Meds printed and in chart.   Contact information:   After hours and holidays please check [email or web address] for group call information for [facility]  Felicia Lowe 01/23/2024, 7:40 AM

## 2024-01-23 NOTE — TOC Progression Note (Signed)
 Transition of Care [facility]) - Progression Note    Patient Details  Name: Felicia Lowe MRN: [id number] Date of Birth: [birth year]  [facility]) CM/SW Contact  Sonda Manuella Quill, RN Phone Number: 01/23/2024, 9:23 AM  Clinical Narrative:    Received VM from [facility] at [facility]; peer to peer requested by Dr Maude Neer; must be completed by 12 noon EST 01/24/24; contact # [phone], option 4; Dr Josefina and Aleck Stalling, PA notified via secure chat.   Expected Discharge Plan: Skilled Nursing Facility Barriers to Discharge: Continued Medical Work up, English as a second language teacher, SNF Pending bed offer  Expected Discharge Plan and Services In-house Referral: Clinical Social Work     Living arrangements for the past 2 months: Single Family Home Expected Discharge Date: 01/21/24                                     Social Determinants of Health (SDOH) Interventions SDOH Screenings   Food Insecurity: No Food Insecurity (01/18/2024)  Housing: Low Risk  (01/18/2024)  Transportation Needs: No Transportation Needs (01/18/2024)  Utilities: Not At Risk (01/18/2024)  Alcohol Screen: Low Risk  (03/18/2023)  Depression (PHQ2-9): Low Risk  (03/18/2023)  Financial Resource Strain: Low Risk  (03/18/2023)  Physical Activity: Inactive (03/18/2023)  Social Connections: Socially Integrated (01/18/2024)  Stress: No Stress Concern Present (03/18/2023)  Tobacco Use: Low Risk  (01/18/2024)  Health Literacy: Adequate Health Literacy (03/18/2023)    Readmission Risk Interventions     No data to display

## 2024-01-24 NOTE — Plan of Care (Signed)
 Patient discharged home via private vehicle with family. AVS and discharge instruction provided. Patient verbalizes understanding. Jon LULLA Reins, RN 01/24/24 4:13 PM

## 2024-01-24 NOTE — TOC Transition Note (Signed)
 Transition of Care [facility]) - Discharge Note   Patient Details  Name: Creasie Lacosse MRN: [id number] Date of Birth: 06/16/1941  Transition of Care [facility]) CM/SW Contact:  NORMAN ASPEN, LCSW Phone Number: 01/24/2024, 9:44 AM   Clinical Narrative:     Updated this morning on ins denial of SNF and change in dc plan to home with HHPT.   Met with pt and she is pleased with plan for returning home.  Agreeable with HHPT and no agency preference.  Referral placed/ accepted with [facility].  No further TOC needs.  Final next level of care: Home w Home Health Services Barriers to Discharge: Barriers Resolved   Patient Goals and CMS Choice Patient states their goals for this hospitalization and ongoing recovery are:: return home following short term rehab          Discharge Placement                       Discharge Plan and Services Additional resources added to the After Visit Summary for   In-house Referral: Clinical Social Work              DME Arranged: N/A DME Agency: NA       HH Arranged: PT HH Agency: [facility] Date HH Agency Contacted: 01/24/24 Time HH Agency Contacted: [phone] Representative spoke with at [facility] Agency: [place]  Social Drivers of Health (SDOH) Interventions SDOH Screenings   Food Insecurity: No Food Insecurity (01/18/2024)  Housing: Low Risk  (01/18/2024)  Transportation Needs: No Transportation Needs (01/18/2024)  Utilities: Not At Risk (01/18/2024)  Alcohol Screen: Low Risk  (03/18/2023)  Depression (PHQ2-9): Low Risk  (03/18/2023)  Financial Resource Strain: Low Risk  (03/18/2023)  Physical Activity: Inactive (03/18/2023)  Social Connections: Socially Integrated (01/18/2024)  Stress: No Stress Concern Present (03/18/2023)  Tobacco Use: Low Risk  (01/18/2024)  Health Literacy: Adequate Health Literacy (03/18/2023)     Readmission Risk Interventions    01/24/2024    9:43 AM  Readmission Risk Prevention Plan  Post Dischage Appt Complete   Medication Screening Complete  Transportation Screening Complete

## 2024-01-24 NOTE — Progress Notes (Signed)
 Occupational Therapy Treatment Patient Details Name: Felicia Lowe MRN: [id number] DOB: 1941-02-08 Today's Date: 01/24/2024   History of present illness 83 yr old female here for evaluation of acute-on-chronic right shoulder pain. She had a fall while at home on 07/06, with acute onset of severe right shoulder pain. Found to have a R proximal humerus fracture with rotator cuff tear. Pt is now s/p a R TSA on 01/18/24. PMH: OA, HTN, scabies, bilateral TKA   OT comments   Education completed regarding compensatory strategies for ADL tasks and functional mobility, management of sling, R ROM per specified parameters in the order set as indicated below, positioning of operative arm in sitting and supine and edema control, including use of Iceman Cold Therapy machine. Daughter present for education, written handouts provided and reviewed using Teach Back and pt/caregiver verbalized/demonstrated understanding. Due to the below listed deficits, pt requires min assistance with ADL tasks and CGA  assist with functional mobility. Caregiver will be able to provide necessary level of assistance at discharge. Pt to follow up with MD to progress rehab of the operative shoulder.       If plan is discharge home, recommend the following:  A little help with walking and/or transfers;A little help with bathing/dressing/bathroom;Assistance with cooking/housework;Assist for transportation;Help with stairs or ramp for entrance   Equipment Recommendations  None recommended by OT       Precautions / Restrictions Precautions Precautions: Fall;Shoulder Type of Shoulder Precautions: AROM elbow, wrist and hand to tolerance; No PROM of shoulder, No AROM of shoulder Shoulder Interventions: Shoulder sling/immobilizer Precaution Booklet Issued: Yes (comment) Recall of Precautions/Restrictions: Intact Precaution/Restrictions Comments: sling on at all times except ADLs and exercise Required Braces or Orthoses:  Sling Restrictions Weight Bearing Restrictions Per Provider Order: Yes RUE Weight Bearing Per Provider Order: Non weight bearing       Mobility Bed Mobility Overal bed mobility: Modified Independent                  Transfers Overall transfer level: Needs assistance Equipment used: Straight cane Transfers: Sit to/from Stand, Bed to chair/wheelchair/BSC Sit to Stand: Contact guard assist     Step pivot transfers: Contact guard assist     General transfer comment: CGA for safety     Balance Overall balance assessment: Mild deficits observed, not formally tested                                         ADL either performed or assessed with clinical judgement   ADL Overall ADL's : Needs assistance/impaired    Per orders, R shoulder parameters as follows for ADL tasks: No shoulder ROM; elbow/wrist/hand ROM only. While moving within specified parameters, pt/caregiver instructed on bathing and how to donn/doff shirt, placing operative arm through sleeve first when donning and off last when doffing.Pt/caregiver educated on compensatory strategies for LB ADL and strategies to reduce risk of falls.  Pt/caregiver educated on donning/doffing sling and to wear the sling at all times with the exception of ADL, and to loosen the neck strap of the sling when the operative arm is in a supported position when sitting. In sitting or supine, pt instructed to have a pillow behind and under their operative arm to provide support. If assist needed with ambulation, caregiver educated on the importance of walking on pt's non-operative side.  Education regarding use of IceMan Cold Therapy completed, including the  importance of using a barrier on the shoulder prior to positioning the wrap-on pad. Pt/caregiver verbalized/demonstrated understanding. Teach Back used while caregiver assisted with dressing pt and positioning wrap-on pad to facilitate DC.                                          Extremity/Trunk Assessment Upper Extremity Assessment Upper Extremity Assessment: Left hand dominant RUE Deficits / Details: distal R elbow, wrist, hand AAROM/AROM WFL's, R shoulder immobile as per MD protocol RUE Coordination: decreased gross motor   Lower Extremity Assessment Lower Extremity Assessment: Overall WFL for tasks assessed        Vision   Vision Assessment?: No apparent visual deficits;Wears glasses for reading         Communication Communication Communication: No apparent difficulties   Cognition Arousal: Alert Behavior During Therapy: WFL for tasks assessed/performed Cognition: No apparent impairments                               Following commands: Intact        Cueing      Exercises Exercises: Shoulder Shoulder Exercises Elbow Flexion: AROM, Right, 10 reps, Seated Elbow Extension: AROM, Right, 10 reps, Seated Wrist Flexion: AROM, Right, Seated Wrist Extension: AROM, Right, Seated Digit Composite Flexion: AROM, Right Composite Extension: AROM, Right    Shoulder Instructions Shoulder Instructions Donning/doffing shirt without moving shoulder: Minimal assistance Method for sponge bathing under operated UE: Minimal assistance Donning/doffing sling/immobilizer: Minimal assistance Correct positioning of sling/immobilizer: Patient able to independently direct caregiver ROM for elbow, wrist and digits of operated UE: Supervision/safety Sling wearing schedule (on at all times/off for ADL's): Independent Proper positioning of operated UE when showering: Minimal assistance Positioning of UE while sleeping: Minimal assistance     General Comments ice pack/man instruction with dtr    Pertinent Vitals/ Pain       Pain Assessment Pain Assessment: No/denies pain   Frequency  Min 2X/week        Progress Toward Goals  OT Goals(current goals can now be found in the care plan section)  Progress towards OT  goals: Progressing toward goals  Acute Rehab OT Goals OT Goal Formulation: With patient/family Time For Goal Achievement: 02/02/24 Potential to Achieve Goals: Good ADL Goals Pt Will Perform Upper Body Dressing: with min assist Pt Will Perform Lower Body Dressing: with supervision;sit to/from stand;sitting/lateral leans Pt Will Transfer to Toilet: with supervision;ambulating Pt Will Perform Toileting - Clothing Manipulation and hygiene: with supervision;sit to/from stand  Plan         AM-PAC OT 6 Clicks Daily Activity     Outcome Measure   Help from another person eating meals?: A Little Help from another person taking care of personal grooming?: A Little Help from another person toileting, which includes using toliet, bedpan, or urinal?: A Little Help from another person bathing (including washing, rinsing, drying)?: A Little Help from another person to put on and taking off regular upper body clothing?: A Little Help from another person to put on and taking off regular lower body clothing?: A Little 6 Click Score: 18    End of Session Equipment Utilized During Treatment: Gait belt;Other (comment) (SPC, sling)  OT Visit Diagnosis: Muscle weakness (generalized) (M62.81);History of falling (Z91.81);Unsteadiness on feet (R26.81)   Activity Tolerance Patient tolerated treatment well   Patient  Left in chair;with call bell/phone within reach   Nurse Communication Mobility status;Other (comment) (completed OT visit with education with daughter)        Time: [id number] OT Time Calculation (min): 26 min  Charges: OT General Charges $OT Visit: 1 Visit OT Treatments $Self Care/Home Management : 8-22 mins $Therapeutic Exercise: 8-22 mins  Leasa Kincannon OT/L Acute Rehabilitation Department  [phone]  01/24/2024, 4:39 PM

## 2024-01-24 NOTE — Progress Notes (Signed)
 Patient seen and examined.  Neurologically intact distally, dressing is clean dry and intact.  Impression: Right proximal humerus fracture status post reverse shoulder replacement  Plan: Discharge home today.  Home health with physical therapy.  Her insurance company denied skilled nursing facility, despite the patient failing to be stable with independent physical therapy, however this was her baseline before she had a proximal humerus fracture, and after an extended family discussion on Monday morning, the patient and the family have elected to be discharged home with home health and physical therapy for gait training, safety, activities of daily living training.  A majority of the concern after discussion was the fact that the daughter was concerned that the patient would be attempting to care for her 33 year old husband, and needed to honor her injury, and care for herself and protect herself.   They have arranged for some additional in-home support, and we will plan for discharge home today, July 21.  Fonda SHAUNNA Olmsted, MD

## 2024-01-31 ENCOUNTER — Encounter: Payer: Medicare HMO | Admitting: Family Medicine

## 2024-01-31 NOTE — Progress Notes (Signed)
 To clarify, patient had stage 2 pressure ulcer to buttocks on admission due to sedentary status prior to surgery.   Army Daring, PA-C

## 2024-02-28 ENCOUNTER — Encounter: Payer: Self-pay | Admitting: Family Medicine

## 2024-02-28 ENCOUNTER — Ambulatory Visit: Admitting: Family Medicine

## 2024-02-28 VITALS — BP 134/70 | HR 78 | Temp 98.2°F | Ht 60.0 in | Wt 151.8 lb

## 2024-02-28 DIAGNOSIS — E559 Vitamin D deficiency, unspecified: Secondary | ICD-10-CM | POA: Diagnosis not present

## 2024-02-28 DIAGNOSIS — Z0000 Encounter for general adult medical examination without abnormal findings: Secondary | ICD-10-CM

## 2024-02-28 DIAGNOSIS — I10 Essential (primary) hypertension: Secondary | ICD-10-CM | POA: Diagnosis not present

## 2024-02-28 DIAGNOSIS — M810 Age-related osteoporosis without current pathological fracture: Secondary | ICD-10-CM

## 2024-02-28 DIAGNOSIS — E7800 Pure hypercholesterolemia, unspecified: Secondary | ICD-10-CM

## 2024-02-28 DIAGNOSIS — E039 Hypothyroidism, unspecified: Secondary | ICD-10-CM | POA: Diagnosis not present

## 2024-02-28 DIAGNOSIS — E2839 Other primary ovarian failure: Secondary | ICD-10-CM

## 2024-02-28 NOTE — Progress Notes (Signed)
 Office Note 02/28/2024  CC:  Chief Complaint  Patient presents with   Transfer of Care    HPI:  Felicia Lowe is a 83 y.o.  female who is here accompanied by her daughter Kate to establish/transfer care, CPE and follow-up hypertension and hypothyroidism. Patient's most recent primary MD: Dr. Harlene Horton. Old records in [facility] EMR were reviewed prior to or during today's visit.  She is feeling well.  No acute concerns. About 6 weeks ago she fell and broke her right humerus. She had to get a reverse total shoulder arthroplasty.  PMP AWARE reviewed today: most recent rx for tramadol  was filled 01/10/2024 [employer], # 20,. No red flags.    Past Medical History:  Diagnosis Date   Allergy     BPV (benign positional vertigo) 02/19/2013   mild   GERD (gastroesophageal reflux disease) 05/27/2014   H/O breast biopsy    benign, on left   History of kidney stones    History of small bowel obstruction    Hypertension    Hypothyroidism    Obesity    Osteoarthritis, multiple sites    severe, b/l knees &b/l hips replaced, shoulder x 1   Osteoporosis    Scabies    Unsteady gait 11/25/2014   Vitamin D  deficiency     Past Surgical History:  Procedure Laterality Date   ABDOMINAL HYSTERECTOMY     CATARACT EXTRACTION Left 2017   2017 and 2019, left   COLONOSCOPY     10/2020 Adenoma x 1, reevaluate 3 years per GI   HIP SURGERY     both hips   KNEE SURGERY     both knees replaced   LITHOTRIPSY     For kidney stones   REVERSE SHOULDER ARTHROPLASTY Right 01/18/2024   Procedure: ARTHROPLASTY, SHOULDER, TOTAL, REVERSE;  Surgeon: Josefina Chew, MD;  Location: [facility];  Service: Orthopedics;  Laterality: Right;    Family History  Problem Relation Age of Onset   Hyperlipidemia Mother    Hypertension Mother    Heart attack Mother    Other Mother        goiter problems   Cancer Father        lung- smoker   Heart attack Father        X 2   Thyroid  disease Brother     Hypertension Son    Rashes / Skin problems Son    Hernia Son        umbilical   Cancer Paternal Grandmother    Macular degeneration Brother    Breast cancer Maternal Aunt    Colon cancer Neg Hx    Esophageal cancer Neg Hx    Rectal cancer Neg Hx    Stomach cancer Neg Hx     Social History   Socioeconomic History   Marital status: Married    Spouse name: Not on file   Number of children: Not on file   Years of education: Not on file   Highest education level: Not on file  Occupational History   Occupation: Retired Warden/ranger  Tobacco Use   Smoking status: Never   Smokeless tobacco: Never  Vaping Use   Vaping status: Never Used  Substance and Sexual Activity   Alcohol use: Not Currently   Drug use: No   Sexual activity: Never  Other Topics Concern   Not on file  Social History Narrative   Not on file   Social Drivers of Health   Financial Resource Strain: Low  Risk  (03/18/2023)   Overall Financial Resource Strain (CARDIA)    Difficulty of Paying Living Expenses: Not hard at all  Food Insecurity: No Food Insecurity (01/18/2024)   Hunger Vital Sign    Worried About Running Out of Food in the Last Year: Never true    Ran Out of Food in the Last Year: Never true  Transportation Needs: No Transportation Needs (01/18/2024)   PRAPARE - Administrator, Civil Service (Medical): No    Lack of Transportation (Non-Medical): No  Physical Activity: Inactive (03/18/2023)   Exercise Vital Sign    Days of Exercise per Week: 0 days    Minutes of Exercise per Session: 0 min  Stress: No Stress Concern Present (03/18/2023)   [employer] of Occupational Health - Occupational Stress Questionnaire    Feeling of Stress : Not at all  Social Connections: Socially Integrated (01/18/2024)   Social Connection and Isolation Panel    Frequency of Communication with Friends and Family: More than three times a week    Frequency of Social Gatherings with Friends and Family:  Twice a week    Attends Religious Services: More than 4 times per year    Active Member of [employer] or Organizations: Yes    Attends Engineer, structural: More than 4 times per year    Marital Status: Married  Catering manager Violence: Not At Risk (01/18/2024)   Humiliation, Afraid, Rape, and Kick questionnaire    Fear of Current or Ex-Partner: No    Emotionally Abused: No    Physically Abused: No    Sexually Abused: No    ROS as above, plus--> no fevers, no CP, no SOB, no wheezing, no cough, no dizziness, no HAs, no rashes, no melena/hematochezia.  No polyuria or polydipsia.  No myalgias or arthralgias.  No focal weakness, paresthesias, or tremors.  No acute vision or hearing abnormalities.  No dysuria or unusual/new urinary urgency or frequency.  No recent changes in lower legs. No n/v/d or abd pain.  No palpitations.    Outpatient Encounter Medications as of 02/28/2024  Medication Sig   Ascorbic Acid (VITAMIN C) 1000 MG tablet Take 1,000 mg by mouth daily.   aspirin  81 MG tablet Take 81 mg by mouth daily.   Cholecalciferol (VITAMIN D ) 2000 UNITS tablet Take 2,000 Units by mouth daily.   Coenzyme Q10 (CO Q 10) 100 MG CAPS Take 100 mg by mouth daily.   lisinopril -hydrochlorothiazide  (ZESTORETIC ) 10-12.5 MG tablet TAKE 1 TABLET BY MOUTH EVERY DAY   Omega-3 Fatty Acids (FISH OIL) 1000 MG CPDR Take 1,000 mg by mouth daily.   Probiotic Product (PROBIOTIC DAILY PO) Take 1 capsule by mouth daily.   SYNTHROID  125 MCG tablet TAKE 1 TABLET (125 MCG TOTAL) BY MOUTH DAILY. SYNTHROID    traMADol  (ULTRAM ) 50 MG tablet Take 1 tablet (50 mg total) by mouth every 6 (six) hours as needed for severe pain (pain score 7-10). (Patient not taking: Reported on 02/28/2024)   No facility-administered encounter medications on file as of 02/28/2024.    Allergies  Allergen Reactions   Egg-Derived Products Shortness Of Breath and Rash   Sulfa Antibiotics Nausea And Vomiting   Keflex  [Cephalexin ] Rash         Penicillins Rash    Tolerated Ancef  for orthopedic procedure on 01/18/2024.   Poison Ivy Extract [Poison Ivy Extract] Rash   PE; Blood pressure (!) 146/72, pulse 78, temperature 98.2 F (36.8 C), temperature source Oral, height 5' (1.524 m),  weight 151 lb 12.8 oz (68.9 kg), SpO2 97%. Body mass index is 29.65 kg/m.  Physical Exam  Gen: Alert, well appearing.  Patient is oriented to person, place, time, and situation. ZWU:Zbzd: no injection, icteris, swelling, or exudate.  EOMI, PERRLA. Mouth: lips without lesion/swelling.  Oral mucosa pink and moist. Oropharynx without erythema, exudate, or swelling.  NECK: no LAD, TM, or tenderness. CV: RRR, no m/r/g.   LUNGS: CTA bilat, nonlabored resps, good aeration in all lung fields. ABD: soft, NT, ND, BS normal.  No hepatospenomegaly or mass.  No bruits. Extremities: Trace edema bilaterally with pretibial hyperpigmentation  Pertinent labs:  Last CBC Lab Results  Component Value Date   WBC 7.3 01/21/2024   HGB 10.8 (L) 01/21/2024   HCT 31.8 (L) 01/21/2024   MCV 90.9 01/21/2024   MCH 30.9 01/21/2024   RDW 13.1 01/21/2024   PLT 284 01/21/2024   Last metabolic panel Lab Results  Component Value Date   GLUCOSE 97 01/20/2024   NA 137 01/20/2024   K 3.4 (L) 01/20/2024   CL 103 01/20/2024   CO2 26 01/20/2024   BUN 8 01/20/2024   CREATININE 0.51 01/20/2024   GFRNONAA >60 01/20/2024   CALCIUM 8.1 (L) 01/20/2024   PHOS 4.1 05/17/2014   PROT 5.4 (L) 01/20/2024   ALBUMIN 2.7 (L) 01/20/2024   BILITOT 0.7 01/20/2024   ALKPHOS 43 01/20/2024   AST 18 01/20/2024   ALT 10 01/20/2024   ANIONGAP 8 01/20/2024   Last lipids Lab Results  Component Value Date   CHOL 146 06/22/2023   HDL 45.10 06/22/2023   LDLCALC 70 06/22/2023   TRIG 154.0 (H) 06/22/2023   CHOLHDL 3 06/22/2023   Last hemoglobin A1c Lab Results  Component Value Date   HGBA1C 5.7 06/22/2023   Last thyroid  functions Lab Results  Component Value Date   TSH 0.05 (L)  06/22/2023   Last vitamin D  Lab Results  Component Value Date   VD25OH 110.08 (HH) 06/22/2023   Lab Results  Component Value Date   HGBA1C 5.7 06/22/2023   ASSESSMENT AND PLAN:   New patient, establishing care.  #1 health maintenance exam: Reviewed age and gender appropriate health maintenance issues (prudent diet, regular exercise, health risks of tobacco and excessive alcohol, use of seatbelts, fire alarms in home, use of sunscreen).  Also reviewed age and gender appropriate health screening as well as vaccine recommendations. Vaccines: Up-to-date. Labs: Health panel Cervical ca screening: n/a d/t age and hx of hysterectomy. Breast ca screening: Mammogram has been ordered-->deferred for now. Colon ca screening: Per GI, reevaluation recommended 2025. Osteoporosis: next DEXA end of 2025.  #2 hypertension, well-controlled on lisinopril -HCTZ 10-12.5, 1 daily.  #3 hypothyroidism. Doing well on 125 mcg levothyroxine  daily. TSH today.  An After Visit Summary was printed and given to the patient.  No follow-ups on file.  Signed:  Gerlene Hockey, MD           02/28/2024

## 2024-02-29 ENCOUNTER — Ambulatory Visit: Payer: Self-pay | Admitting: Family Medicine

## 2024-02-29 LAB — LIPID PANEL
Cholesterol: 164 mg/dL (ref <200)
HDL: 51 mg/dL (ref >=50)
LDL Cholesterol (Calc): 89 mg/dL
Non-HDL Cholesterol (Calc): 113 mg/dL (ref <130)
Total CHOL/HDL Ratio: 3.2 (calc) (ref <5.0)
Triglycerides: 144 mg/dL (ref <150)

## 2024-02-29 LAB — CBC WITH DIFFERENTIAL/PLATELET
Absolute Lymphocytes: 1233 {cells}/uL (ref 850–3900)
Absolute Monocytes: 596 {cells}/uL (ref 200–950)
Basophils Absolute: 41 {cells}/uL (ref 0–200)
Basophils Relative: 0.7 %
Eosinophils Absolute: 266 {cells}/uL (ref 15–500)
Eosinophils Relative: 4.5 %
HCT: 41.4 % (ref 35.0–45.0)
Hemoglobin: 13.3 g/dL (ref 11.7–15.5)
MCH: 30.1 pg (ref 27.0–33.0)
MCHC: 32.1 g/dL (ref 32.0–36.0)
MCV: 93.7 fL (ref 80.0–100.0)
MPV: 10.4 fL (ref 7.5–12.5)
Monocytes Relative: 10.1 %
Neutro Abs: 3764 {cells}/uL (ref 1500–7800)
Neutrophils Relative %: 63.8 %
Platelets: 319 Thousand/uL (ref 140–400)
RBC: 4.42 Million/uL (ref 3.80–5.10)
RDW: 12.9 % (ref 11.0–15.0)
Total Lymphocyte: 20.9 %
WBC: 5.9 Thousand/uL (ref 3.8–10.8)

## 2024-02-29 LAB — COMPREHENSIVE METABOLIC PANEL WITH GFR
AG Ratio: 1.7 (calc) (ref 1.0–2.5)
ALT: 7 U/L (ref 6–29)
AST: 11 U/L (ref 10–35)
Albumin: 4.1 g/dL (ref 3.6–5.1)
Alkaline phosphatase (APISO): 62 U/L (ref 37–153)
BUN: 11 mg/dL (ref 7–25)
CO2: 28 mmol/L (ref 20–32)
Calcium: 9.4 mg/dL (ref 8.6–10.4)
Chloride: 105 mmol/L (ref 98–110)
Creat: 0.62 mg/dL (ref 0.60–0.95)
Globulin: 2.4 g/dL (ref 1.9–3.7)
Glucose, Bld: 96 mg/dL (ref 65–99)
Potassium: 4 mmol/L (ref 3.5–5.3)
Sodium: 143 mmol/L (ref 135–146)
Total Bilirubin: 0.5 mg/dL (ref 0.2–1.2)
Total Protein: 6.5 g/dL (ref 6.1–8.1)
eGFR: 88 mL/min/1.73m2 (ref >=60)

## 2024-02-29 LAB — VITAMIN D 25 HYDROXY (VIT D DEFICIENCY, FRACTURES): Vit D, 25-Hydroxy: 65 ng/mL (ref 30–100)

## 2024-02-29 LAB — TSH: TSH: 0.14 m[IU]/L — ABNORMAL LOW (ref 0.40–4.50)

## 2024-04-02 ENCOUNTER — Encounter: Payer: Self-pay | Admitting: Family Medicine

## 2024-04-05 ENCOUNTER — Ambulatory Visit: Payer: Self-pay

## 2024-04-05 NOTE — Telephone Encounter (Signed)
 FYI Only or Action Required?: FYI only for provider.  Patient was last seen in primary care on 02/28/2024 by McGowen, Aleene DEL, MD.  Called Nurse Triage reporting Leg Swelling.  Symptoms began several weeks ago.  Interventions attempted: Rest, hydration, or home remedies.  Symptoms are: unchanged.  Triage Disposition: See PCP When Office is Open (Within 3 Days)  Patient/caregiver understands and will follow disposition?: Yes Reason for Disposition  [1] MILD swelling of both ankles (i.e., pedal edema) AND [2] new-onset or getting worse  Answer Assessment - Initial Assessment Questions Going to PT for right broken shoulder, and advised having it checked out. Right hand also swells, hard to make a fist, denies redness or discoloration on the hand. Trying to drink more water  to help but finding that to not help.  1. ONSET: When did the swelling start? (e.g., minutes, hours, days)     A month  2. LOCATION: What part of the leg is swollen?  Are both legs swollen or just one leg?     Both legs and ankles  3. SEVERITY: How bad is the swelling? (e.g., localized; mild, moderate, severe)     Goes done, but doesn't go completely away  4. REDNESS: Is there redness or signs of infection?     A little redness towards the ankles, comes and goes  5. PAIN: Is the swelling painful to touch? If Yes, ask: How painful is it?   (Scale 1-10; mild, moderate or severe)     No   6. CAUSE: What do you think is causing the leg swelling?     Unsure  7. OTHER SYMPTOMS: Do you have any other symptoms? (e.g., chest pain, difficulty breathing)       No  Protocols used: Leg Swelling and Edema-A-AH  Copied from CRM #[id number]. Topic: Clinical - Red Word Triage >> Apr 05, 2024 12:44 PM Frederich PARAS wrote: [facility] that prompted transfer to Nurse Triage: swollen legs and hand, discoloration.  Recent broken shoulder she says she  started physical therapy, swollen legs,and right hand, ankles are  discolered, it comes and goes. they go down but they dont stay down.

## 2024-04-05 NOTE — Telephone Encounter (Signed)
 Noted, pt is scheduled for tomorrow.

## 2024-04-06 ENCOUNTER — Encounter: Payer: Self-pay | Admitting: Family Medicine

## 2024-04-06 ENCOUNTER — Ambulatory Visit: Admitting: Family Medicine

## 2024-04-06 VITALS — BP 132/77 | HR 77 | Temp 98.0°F | Ht 60.0 in | Wt 152.2 lb

## 2024-04-06 DIAGNOSIS — I872 Venous insufficiency (chronic) (peripheral): Secondary | ICD-10-CM

## 2024-04-06 DIAGNOSIS — R600 Localized edema: Secondary | ICD-10-CM | POA: Diagnosis not present

## 2024-04-06 MED ORDER — FUROSEMIDE 20 MG PO TABS: 20.0000 mg | ORAL_TABLET | Freq: Every day | ORAL | 0 refills | Status: DC

## 2024-04-06 NOTE — Progress Notes (Signed)
 OFFICE VISIT  04/06/2024  CC:  Chief Complaint  Patient presents with   Swelling    Bilat leg and ankle x 1 month; swollen legs and right hand, ankles are discolored, it comes and goes. they go down but they dont stay down.     Patient is a 83 y.o. female who presents accompanied by her daughter Elvie for legs swelling.  HPI: Has noticed increased swelling in the legs over the last 4 weeks.  No pain in the legs, they just feel tight, worse at the end of the day, they go down only a little bit overnight. She does describe a diet that has high sodium foods, although she does not add salt to her food. No shortness of breath or chest pain.  She has a little bit of feeling of swelling in the right arm since having right shoulder surgery a couple of months ago.  Past Medical History:  Diagnosis Date   Allergy     BPV (benign positional vertigo) 02/19/2013   mild   GERD (gastroesophageal reflux disease) 05/27/2014   H/O breast biopsy    benign, on left   History of kidney stones    History of small bowel obstruction    Hypertension    Hypothyroidism    Obesity    Osteoarthritis, multiple sites    severe, b/l knees &b/l hips replaced, shoulder x 1   Osteoporosis    Unsteady gait 11/25/2014   Vitamin D  deficiency     Past Surgical History:  Procedure Laterality Date   ABDOMINAL HYSTERECTOMY     CATARACT EXTRACTION Left 2017   2017 and 2019, left   COLONOSCOPY     10/2020 Adenoma x 1, reevaluate 3 years per GI   HIP SURGERY     both hips   KNEE SURGERY     both knees replaced   LITHOTRIPSY     For kidney stones   REVERSE SHOULDER ARTHROPLASTY Right 01/18/2024   Procedure: ARTHROPLASTY, SHOULDER, TOTAL, REVERSE;  Surgeon: Josefina Chew, MD;  Location: [facility];  Service: Orthopedics;  Laterality: Right;     Outpatient Medications Prior to Visit  Medication Sig Dispense Refill   Ascorbic Acid (VITAMIN C) 1000 MG tablet Take 1,000 mg by mouth daily.     aspirin  81 MG  tablet Take 81 mg by mouth daily.     Cholecalciferol (VITAMIN D ) 2000 UNITS tablet Take 2,000 Units by mouth daily.     Coenzyme Q10 (CO Q 10) 100 MG CAPS Take 100 mg by mouth daily.     lisinopril -hydrochlorothiazide  (ZESTORETIC ) 10-12.5 MG tablet TAKE 1 TABLET BY MOUTH EVERY DAY 90 tablet 3   Omega-3 Fatty Acids (FISH OIL) 1000 MG CPDR Take 1,000 mg by mouth daily.     Probiotic Product (PROBIOTIC DAILY PO) Take 1 capsule by mouth daily.     SYNTHROID  125 MCG tablet TAKE 1 TABLET (125 MCG TOTAL) BY MOUTH DAILY. SYNTHROID  90 tablet 3   traMADol  (ULTRAM ) 50 MG tablet Take 1 tablet (50 mg total) by mouth every 6 (six) hours as needed for severe pain (pain score 7-10). (Patient not taking: Reported on 04/06/2024) 20 tablet 0   No facility-administered medications prior to visit.    Allergies  Allergen Reactions   Egg-Derived Products Shortness Of Breath and Rash   Sulfa Antibiotics Nausea And Vomiting   Keflex  [Cephalexin ] Rash        Penicillins Rash    Tolerated Ancef  for orthopedic procedure on 01/18/2024.   Poison  Ivy Extract [Poison Ivy Extract] Rash    Review of Systems  As per HPI  PE:    04/06/2024    4:02 PM 02/28/2024   10:22 AM 02/28/2024    9:48 AM  Vitals with BMI  Height 5' 0  5' 0  Weight 152 lbs 3 oz  151 lbs 13 oz  BMI 29.72  29.65  Systolic 132 134 853  Diastolic 77 70 72  Pulse 77  78     Physical Exam  Gen: Alert, well appearing.  Patient is oriented to person, place, time, and situation. AFFECT: pleasant, lucid thought and speech. CV: RRR, no m/r/g.   LUNGS: CTA bilat, nonlabored resps, good aeration in all lung fields. Extremities: 3+ bilateral lower extremity pitting edema from the knees down to the feet.  She has some slight hyperpigmentation and fibrotic changes of the pretibial region bilaterally No erythema or tenderness. Arms without any swelling or erythema.  LABS:  Last CBC Lab Results  Component Value Date   WBC 5.9 02/28/2024   HGB  13.3 02/28/2024   HCT 41.4 02/28/2024   MCV 93.7 02/28/2024   MCH 30.1 02/28/2024   RDW 12.9 02/28/2024   PLT 319 02/28/2024   Last metabolic panel Lab Results  Component Value Date   GLUCOSE 96 02/28/2024   NA 143 02/28/2024   K 4.0 02/28/2024   CL 105 02/28/2024   CO2 28 02/28/2024   BUN 11 02/28/2024   CREATININE 0.62 02/28/2024   EGFR 88 02/28/2024   CALCIUM 9.4 02/28/2024   PHOS 4.1 05/17/2014   PROT 6.5 02/28/2024   ALBUMIN 2.7 (L) 01/20/2024   BILITOT 0.5 02/28/2024   ALKPHOS 43 01/20/2024   AST 11 02/28/2024   ALT 7 02/28/2024   ANIONGAP 8 01/20/2024   Last lipids Lab Results  Component Value Date   CHOL 164 02/28/2024   HDL 51 02/28/2024   LDLCALC 89 02/28/2024   TRIG 144 02/28/2024   CHOLHDL 3.2 02/28/2024   Last hemoglobin A1c Lab Results  Component Value Date   HGBA1C 5.7 06/22/2023   Last thyroid  functions Lab Results  Component Value Date   TSH 0.14 (L) 02/28/2024   IMPRESSION AND PLAN:  Bilateral lower extremity edema due to chronic venous insufficiency. Discussed the importance of decreasing sodium in her diet. Also discussed compression stockings but she is not ready to try this right now. Will do Lasix  20 mg a day and recheck in about a week. Of note, she was on Lasix  20 mg a day in the remote past but somewhere along the line this medication dropped off (years ago).  An After Visit Summary was printed and given to the patient.  FOLLOW UP: Return in about 1 week (around 04/13/2024) for Follow-up edema.  Signed:  Gerlene Hockey, MD           04/06/2024

## 2024-04-12 ENCOUNTER — Encounter: Payer: Self-pay | Admitting: Family Medicine

## 2024-04-12 ENCOUNTER — Ambulatory Visit: Admitting: Family Medicine

## 2024-04-12 VITALS — BP 108/60 | HR 87 | Temp 97.8°F | Ht 60.0 in | Wt 149.8 lb

## 2024-04-12 DIAGNOSIS — R110 Nausea: Secondary | ICD-10-CM

## 2024-04-12 DIAGNOSIS — R600 Localized edema: Secondary | ICD-10-CM

## 2024-04-12 DIAGNOSIS — N390 Urinary tract infection, site not specified: Secondary | ICD-10-CM | POA: Diagnosis not present

## 2024-04-12 DIAGNOSIS — I872 Venous insufficiency (chronic) (peripheral): Secondary | ICD-10-CM

## 2024-04-12 LAB — POCT URINALYSIS DIPSTICK
Bilirubin, UA: NEGATIVE
Blood, UA: NEGATIVE
Glucose, UA: NEGATIVE
Ketones, UA: NEGATIVE
Nitrite, UA: NEGATIVE
Protein, UA: NEGATIVE
Spec Grav, UA: 1.015 (ref 1.010–1.025)
Urobilinogen, UA: 0.2 U/dL
pH, UA: 6 (ref 5.0–8.0)

## 2024-04-12 NOTE — Progress Notes (Signed)
 OFFICE VISIT  04/12/2024  CC:  Chief Complaint  Patient presents with   Follow-up    1 week f/u edema; pt c/o nausea and diarrhea x 3 days.    Patient is a 83 y.o. female who presents accompanied by her daughter for 1 week follow-up edema. A/P as of last visit: Bilateral lower extremity edema due to chronic venous insufficiency. Discussed the importance of decreasing sodium in her diet. Also discussed compression stockings but she is not ready to try this right now. Will do Lasix  20 mg a day and recheck in about a week. Of note, she was on Lasix  20 mg a day in the remote past but somewhere along the line this medication dropped off (years ago).  INTERIM HX: Swelling is improved. About 3 days after starting the Lasix  she did notice onset of some nausea.  It is mild.  No vomiting.  No fevers, abdominal pain, diarrhea, or constipation.  No recent known sick contacts.  No dysuria, hematuria, or urinary urgency or frequency. She has had a history of having several UTIs per year, often without any preceding classic urinary tract infection symptoms.  Review of systems: As above, plus no cough, shortness of breath, chest pain, rash, flank pain, or palpitations or dizziness.  Denies reflux.  Past Medical History:  Diagnosis Date   Allergy     BPV (benign positional vertigo) 02/19/2013   mild   GERD (gastroesophageal reflux disease) 05/27/2014   H/O breast biopsy    benign, on left   History of kidney stones    History of small bowel obstruction    Hypertension    Hypothyroidism    Obesity    Osteoarthritis, multiple sites    severe, b/l knees &b/l hips replaced, shoulder x 1   Osteoporosis    Unsteady gait 11/25/2014   Vitamin D  deficiency     Past Surgical History:  Procedure Laterality Date   ABDOMINAL HYSTERECTOMY     CATARACT EXTRACTION Left 2017   2017 and 2019, left   COLONOSCOPY     10/2020 Adenoma x 1, reevaluate 3 years per GI   HIP SURGERY     both hips   KNEE  SURGERY     both knees replaced   LITHOTRIPSY     For kidney stones   REVERSE SHOULDER ARTHROPLASTY Right 01/18/2024   Procedure: ARTHROPLASTY, SHOULDER, TOTAL, REVERSE;  Surgeon: Josefina Chew, MD;  Location: [facility];  Service: Orthopedics;  Laterality: Right;    Outpatient Medications Prior to Visit  Medication Sig Dispense Refill   Ascorbic Acid (VITAMIN C) 1000 MG tablet Take 1,000 mg by mouth daily.     aspirin  81 MG tablet Take 81 mg by mouth daily.     Cholecalciferol (VITAMIN D ) 2000 UNITS tablet Take 2,000 Units by mouth daily.     Coenzyme Q10 (CO Q 10) 100 MG CAPS Take 100 mg by mouth daily.     furosemide  (LASIX ) 20 MG tablet Take 1 tablet (20 mg total) by mouth daily. 30 tablet 0   lisinopril -hydrochlorothiazide  (ZESTORETIC ) 10-12.5 MG tablet TAKE 1 TABLET BY MOUTH EVERY DAY 90 tablet 3   Omega-3 Fatty Acids (FISH OIL) 1000 MG CPDR Take 1,000 mg by mouth daily.     Probiotic Product (PROBIOTIC DAILY PO) Take 1 capsule by mouth daily.     SYNTHROID  125 MCG tablet TAKE 1 TABLET (125 MCG TOTAL) BY MOUTH DAILY. SYNTHROID  90 tablet 3   traMADol  (ULTRAM ) 50 MG tablet Take 1 tablet (50  mg total) by mouth every 6 (six) hours as needed for severe pain (pain score 7-10). (Patient not taking: Reported on 04/12/2024) 20 tablet 0   No facility-administered medications prior to visit.    Allergies  Allergen Reactions   Egg-Derived Products Shortness Of Breath and Rash   Sulfa Antibiotics Nausea And Vomiting   Keflex  [Cephalexin ] Rash        Penicillins Rash    Tolerated Ancef  for orthopedic procedure on 01/18/2024.   Poison Ivy Extract [Poison Ivy Extract] Rash    Review of Systems As per HPI  PE:    04/12/2024    4:02 PM 04/06/2024    4:02 PM 02/28/2024   10:22 AM  Vitals with BMI  Height 5' 0 5' 0   Weight 149 lbs 13 oz 152 lbs 3 oz   BMI 29.26 29.72   Systolic 108 132 865  Diastolic 60 77 70  Pulse 87 77      Physical Exam  General: Alert and  well-appearing. She is pleasant, thought and speech are lucid. ZWU:Zbzd: no injection, icteris, swelling, or exudate.  EOMI, PERRLA. Mouth: lips without lesion/swelling.  Oral mucosa pink and moist. Oropharynx without erythema, exudate, or swelling.  CV: RRR, no m/r/g.   LUNGS: CTA bilat, nonlabored resps, good aeration in all lung fields. ABD: soft, NT/ND Extremities: She has 1-2+ bilateral lower extremity pitting edema (right side a bit worse than the left) from about mid tibia level down into the tops of her feet.  She does have pretibial hyperpigmentation and some fibrotic skin changes.  No erythema or tenderness.  LABS:  Last CBC Lab Results  Component Value Date   WBC 5.9 02/28/2024   HGB 13.3 02/28/2024   HCT 41.4 02/28/2024   MCV 93.7 02/28/2024   MCH 30.1 02/28/2024   RDW 12.9 02/28/2024   PLT 319 02/28/2024   Last metabolic panel Lab Results  Component Value Date   GLUCOSE 96 02/28/2024   NA 143 02/28/2024   K 4.0 02/28/2024   CL 105 02/28/2024   CO2 28 02/28/2024   BUN 11 02/28/2024   CREATININE 0.62 02/28/2024   EGFR 88 02/28/2024   CALCIUM 9.4 02/28/2024   PHOS 4.1 05/17/2014   PROT 6.5 02/28/2024   ALBUMIN 2.7 (L) 01/20/2024   BILITOT 0.5 02/28/2024   ALKPHOS 43 01/20/2024   AST 11 02/28/2024   ALT 7 02/28/2024   ANIONGAP 8 01/20/2024   Lab Results  Component Value Date   TSH 0.14 (L) 02/28/2024   IMPRESSION AND PLAN:  #1 bilateral lower extremity edema due to chronic venous insufficiency. Improved with decreasing sodium in the diet and 20 mg of Lasix  daily.  #2 nausea. Question of being a side effect from the Lasix .  We discussed possible hold of this medication if her nausea persists over the next couple of days. Point-of-care dipstick UA today: Trace leukocytes.  Hold off on antibiotics right now.  Sent specimen for culture.  An After Visit Summary was printed and given to the patient.  FOLLOW UP: Return for Keep appointment set for  05/02/2024.  Signed:  Gerlene Hockey, MD           04/12/2024

## 2024-04-14 ENCOUNTER — Ambulatory Visit: Payer: Self-pay | Admitting: Family Medicine

## 2024-04-14 LAB — URINE CULTURE
MICRO NUMBER:: 17073352
SPECIMEN QUALITY:: ADEQUATE

## 2024-04-14 MED ORDER — CIPROFLOXACIN HCL 500 MG PO TABS: 500.0000 mg | ORAL_TABLET | Freq: Two times a day (BID) | ORAL | 0 refills | Status: AC

## 2024-04-29 ENCOUNTER — Other Ambulatory Visit: Payer: Self-pay | Admitting: Family Medicine

## 2024-05-01 NOTE — Telephone Encounter (Signed)
 Pt has follow up appt to discuss medication tomorrow

## 2024-05-02 ENCOUNTER — Encounter: Payer: Self-pay | Admitting: Family Medicine

## 2024-05-02 ENCOUNTER — Ambulatory Visit: Admitting: Family Medicine

## 2024-05-02 VITALS — BP 100/66 | HR 82 | Temp 97.1°F | Ht 60.0 in | Wt 153.6 lb

## 2024-05-02 DIAGNOSIS — E2839 Other primary ovarian failure: Secondary | ICD-10-CM

## 2024-05-02 DIAGNOSIS — I10 Essential (primary) hypertension: Secondary | ICD-10-CM

## 2024-05-02 DIAGNOSIS — M810 Age-related osteoporosis without current pathological fracture: Secondary | ICD-10-CM | POA: Diagnosis not present

## 2024-05-02 DIAGNOSIS — R600 Localized edema: Secondary | ICD-10-CM

## 2024-05-02 DIAGNOSIS — E039 Hypothyroidism, unspecified: Secondary | ICD-10-CM | POA: Diagnosis not present

## 2024-05-02 LAB — BASIC METABOLIC PANEL WITH GFR
BUN: 15 mg/dL (ref 6–23)
CO2: 30 meq/L (ref 19–32)
Calcium: 9.3 mg/dL (ref 8.4–10.5)
Chloride: 102 meq/L (ref 96–112)
Creatinine, Ser: 0.64 mg/dL (ref 0.40–1.20)
GFR: 81.84 mL/min (ref >60.00)
Glucose, Bld: 83 mg/dL (ref 70–99)
Potassium: 3.7 meq/L (ref 3.5–5.1)
Sodium: 142 meq/L (ref 135–145)

## 2024-05-02 LAB — TSH: TSH: 0.57 u[IU]/mL (ref 0.35–5.50)

## 2024-05-02 MED ORDER — FUROSEMIDE 20 MG PO TABS: 20.0000 mg | ORAL_TABLET | Freq: Every day | ORAL | 3 refills | Status: AC

## 2024-05-02 NOTE — Progress Notes (Signed)
 OFFICE VISIT  05/02/2024  CC:  Chief Complaint  Patient presents with   Medical Management of Chronic Issues    Patient is a 83 y.o. female who presents accompanied by her daughter Kate for follow-up bilateral lower extremity edema, hypertension, and hypothyroidism. I last saw her 3 weeks ago. A/P as of that visit: #1 bilateral lower extremity edema due to chronic venous insufficiency. Improved with decreasing sodium in the diet and 20 mg of Lasix  daily.   #2 nausea. Question of being a side effect from the Lasix .  We discussed possible hold of this medication if her nausea persists over the next couple of days. Point-of-care dipstick UA today: Trace leukocytes.  Hold off on antibiotics right now.  Sent specimen for culture.  INTERIM HX: Urine culture was positive for E. coli, pansensitive.  Abx rx'd.  Feels well. No urinary urgency or frequency or nocturia.  No more nausea. Says her lower extremity swelling has been slightly improved.  She admits that she is not consistently eating a low-sodium diet. She takes Lasix  20 mg every morning. Still feels like right hand is swollen some and cannot close it completely due to the feeling of swelling.  This started after her right shoulder surgery.  Follow-up with orthopedist yesterday--> he reassured her this should gradually resolve over the next year.  Denies dyspnea on exertion or chest pain or palpitations. She has had no recent falls.  ROS as above, plus--> no fevers, no wheezing, no cough, no dizziness, no HAs, no rashes, no melena/hematochezia.  No polyuria or polydipsia.  No myalgias or arthralgias.  No focal weakness, paresthesias, or tremors.  No acute vision or hearing abnormalities.  No dysuria. No n/v/d or abd pain.  No palpitations.    Past Medical History:  Diagnosis Date   Allergy     BPV (benign positional vertigo) 02/19/2013   mild   GERD (gastroesophageal reflux disease) 05/27/2014   H/O breast biopsy    benign,  on left   History of kidney stones    History of small bowel obstruction    Hypertension    Hypothyroidism    Obesity    Osteoarthritis, multiple sites    severe, b/l knees &b/l hips replaced, shoulder x 1   Osteoporosis    Unsteady gait 11/25/2014   Vitamin D  deficiency     Past Surgical History:  Procedure Laterality Date   ABDOMINAL HYSTERECTOMY     CATARACT EXTRACTION Left 2017   2017 and 2019, left   COLONOSCOPY     10/2020 Adenoma x 1, reevaluate 3 years per GI   HIP SURGERY     both hips   KNEE SURGERY     both knees replaced   LITHOTRIPSY     For kidney stones   REVERSE SHOULDER ARTHROPLASTY Right 01/18/2024   Procedure: ARTHROPLASTY, SHOULDER, TOTAL, REVERSE;  Surgeon: Josefina Chew, MD;  Location: [facility];  Service: Orthopedics;  Laterality: Right;    Outpatient Medications Prior to Visit  Medication Sig Dispense Refill   Ascorbic Acid (VITAMIN C) 1000 MG tablet Take 1,000 mg by mouth daily.     aspirin  81 MG tablet Take 81 mg by mouth daily.     Cholecalciferol (VITAMIN D ) 2000 UNITS tablet Take 2,000 Units by mouth daily.     Coenzyme Q10 (CO Q 10) 100 MG CAPS Take 100 mg by mouth daily.     lisinopril -hydrochlorothiazide  (ZESTORETIC ) 10-12.5 MG tablet TAKE 1 TABLET BY MOUTH EVERY DAY 90 tablet 3  Omega-3 Fatty Acids (FISH OIL) 1000 MG CPDR Take 1,000 mg by mouth daily.     Probiotic Product (PROBIOTIC DAILY PO) Take 1 capsule by mouth daily.     SYNTHROID  125 MCG tablet TAKE 1 TABLET (125 MCG TOTAL) BY MOUTH DAILY. SYNTHROID  90 tablet 3   furosemide  (LASIX ) 20 MG tablet Take 1 tablet (20 mg total) by mouth daily. 30 tablet 0   traMADol  (ULTRAM ) 50 MG tablet Take 1 tablet (50 mg total) by mouth every 6 (six) hours as needed for severe pain (pain score 7-10). (Patient not taking: Reported on 05/02/2024) 20 tablet 0   No facility-administered medications prior to visit.    Allergies  Allergen Reactions   Egg Protein-Containing Drug Products Shortness Of  Breath and Rash   Sulfa Antibiotics Nausea And Vomiting   Keflex  [Cephalexin ] Rash        Penicillins Rash    Tolerated Ancef  for orthopedic procedure on 01/18/2024.   Poison Ivy Extract [Poison Ivy Extract] Rash    Review of Systems As per HPI  PE:    05/02/2024    9:30 AM 04/12/2024    4:02 PM 04/06/2024    4:02 PM  Vitals with BMI  Height 5' 0 5' 0 5' 0  Weight 153 lbs 10 oz 149 lbs 13 oz 152 lbs 3 oz  BMI 30 29.26 29.72  Systolic 100 108 867  Diastolic 66 60 77  Pulse 82 87 77     Physical Exam  Gen: Alert, well appearing.  Patient is oriented to person, place, time, and situation. AFFECT: pleasant, lucid thought and speech. CV: RRR, no m/r/g.   LUNGS: CTA bilat, nonlabored resps, good aeration in all lung fields. Extremities: 3+ bilateral lower extremity pitting edema, right slightly worse than the left.  LABS:  Last CBC Lab Results  Component Value Date   WBC 5.9 02/28/2024   HGB 13.3 02/28/2024   HCT 41.4 02/28/2024   MCV 93.7 02/28/2024   MCH 30.1 02/28/2024   RDW 12.9 02/28/2024   PLT 319 02/28/2024   Last metabolic panel Lab Results  Component Value Date   GLUCOSE 96 02/28/2024   NA 143 02/28/2024   K 4.0 02/28/2024   CL 105 02/28/2024   CO2 28 02/28/2024   BUN 11 02/28/2024   CREATININE 0.62 02/28/2024   EGFR 88 02/28/2024   CALCIUM 9.4 02/28/2024   PHOS 4.1 05/17/2014   PROT 6.5 02/28/2024   ALBUMIN 2.7 (L) 01/20/2024   BILITOT 0.5 02/28/2024   ALKPHOS 43 01/20/2024   AST 11 02/28/2024   ALT 7 02/28/2024   ANIONGAP 8 01/20/2024   Last lipids Lab Results  Component Value Date   CHOL 164 02/28/2024   HDL 51 02/28/2024   LDLCALC 89 02/28/2024   TRIG 144 02/28/2024   CHOLHDL 3.2 02/28/2024   Last hemoglobin A1c Lab Results  Component Value Date   HGBA1C 5.7 06/22/2023   Last thyroid  functions Lab Results  Component Value Date   TSH 0.14 (L) 02/28/2024   FREET4 1.13 10/27/2019   Last vitamin D  Lab Results  Component  Value Date   VD25OH 65 02/28/2024   IMPRESSION AND PLAN:  #1 bilateral lower extreme edema, chronic venous insufficiency. Stable on Lasix  20 mg every morning.  Continue best efforts at low-sodium diet and as needed elevation. She can use an extra daily dose of Lasix  on a as needed basis but will call if she is having to do this for several days in  a row. Monitor electrolytes and creatinine today.  2.  Essential hypertension, stable on lisinopril -HCTZ 10-12.5, 1 tab daily. Monitor electrolytes and creatinine today.  3. Hypothyroidism, most recent TSH was a little bit low--> 2 months ago.  At that time we changed her dose to: one half of 125 mcg tab 2 days a week and continue 1 whole tab 5 days/week.  TSH monitoring today.  #4 osteoporosis. This diagnosis is listed in her past medical history but there is no clear history that she was ever on medication other than her vitamin D  and calcium. Most recent DEXAs 2019 and 2023 her lowest bone density was -2.3. DEXA ordered today.  An After Visit Summary was printed and given to the patient.  FOLLOW UP: Return in about 3 months (around 08/02/2024) for routine chronic illness f/u.  Signed:  Gerlene Hockey, MD           05/02/2024

## 2024-05-03 ENCOUNTER — Ambulatory Visit: Payer: Self-pay | Admitting: Family Medicine

## 2024-05-25 ENCOUNTER — Other Ambulatory Visit: Payer: Self-pay | Admitting: Family Medicine

## 2024-05-25 NOTE — Telephone Encounter (Signed)
 Copied from CRM #[id number]. Topic: Clinical - Medication Refill >> May 25, 2024  9:22 AM Macario HERO wrote: Medication: SYNTHROID  125 MCG tablet [[id number]]  Has the patient contacted their pharmacy? No (Agent: If no, request that the patient contact the pharmacy for the refill. If patient does not wish to contact the pharmacy document the reason why and proceed with request.) (Agent: If yes, when and what did the pharmacy advise?)  This is the patient's preferred pharmacy:  [id number] - [place], [place] - [address] [place] [address] [place] [address] [place] [address] Phone: [phone] Fax: [phone]    Is this the correct pharmacy for this prescription? Yes If no, delete pharmacy and type the correct one.   Has the prescription been filled recently? Yes  Is the patient out of the medication? No, 5 days left  Has the patient been seen for an appointment in the last year OR does the patient have an upcoming appointment? Yes  Can we respond through MyChart? Yes  Agent: Please be advised that Rx refills may take up to 3 business days. We ask that you follow-up with your pharmacy.

## 2024-05-31 ENCOUNTER — Telehealth: Payer: Self-pay | Admitting: Family Medicine

## 2024-05-31 NOTE — Telephone Encounter (Unsigned)
 Copied from CRM #[id number]. Topic: Clinical - Medication Refill >> May 31, 2024  4:08 PM Alfonso HERO wrote: Medication: lisinopril -hydrochlorothiazide  (ZESTORETIC ) 10-12.5 MG tablet  Has the patient contacted their pharmacy? Yes (Agent: If no, request that the patient contact the pharmacy for the refill. If patient does not wish to contact the pharmacy document the reason why and proceed with request.) (Agent: If yes, when and what did the pharmacy advise?)  This is the patient's preferred pharmacy:  [id number] - [place], [place] - [address] [place] [place] [address] [place] [address] Phone: [phone] Fax: [phone]  Is this the correct pharmacy for this prescription? Yes If no, delete pharmacy and type the correct one.   Has the prescription been filled recently? Yes  Is the patient out of the medication? Yes  Has the patient been seen for an appointment in the last year OR does the patient have an upcoming appointment? Yes  Can we respond through MyChart? Yes  Agent: Please be advised that Rx refills may take up to 3 business days. We ask that you follow-up with your pharmacy.

## 2024-06-05 ENCOUNTER — Telehealth: Payer: Self-pay | Admitting: Family Medicine

## 2024-06-05 NOTE — Telephone Encounter (Unsigned)
 Copied from CRM #[id number]. Topic: Clinical - Medication Refill >> Jun 05, 2024  4:46 PM Felicia Lowe wrote: Medication:  SYNTHROID  125 MCG tablet Has the patient contacted their pharmacy? Yes (Agent: If no, request that the patient contact the pharmacy for the refill. If patient does not wish to contact the pharmacy document the reason why and proceed with request.) (Agent: If yes, when and what did the pharmacy advise?)  This is the patient's preferred pharmacy:  [id number] - [place], [place] - [address] [place] [address] [place] [address] Phone: [phone] Fax: [phone]   Is this the correct pharmacy for this prescription? Yes If no, delete pharmacy and type the correct one.   Has the prescription been filled recently? No was 3 month script  Is the patient out of the medication? Yes  Has the patient been seen for an appointment in the last year OR does the patient have an upcoming appointment? Yes  Can we respond through MyChart? Yes  Agent: Please be advised that Rx refills may take up to 3 business days. We ask that you follow-up with your pharmacy.

## 2024-06-07 ENCOUNTER — Encounter: Payer: Self-pay | Admitting: Family Medicine

## 2024-06-08 NOTE — Telephone Encounter (Signed)
 No further action needed at this time.

## 2024-06-22 ENCOUNTER — Other Ambulatory Visit (HOSPITAL_BASED_OUTPATIENT_CLINIC_OR_DEPARTMENT_OTHER)

## 2024-07-05 ENCOUNTER — Ambulatory Visit

## 2024-07-13 ENCOUNTER — Ambulatory Visit (HOSPITAL_BASED_OUTPATIENT_CLINIC_OR_DEPARTMENT_OTHER)
Admission: RE | Admit: 2024-07-13 | Discharge: 2024-07-13 | Disposition: A | Discharge status: Home / Self Care | Source: Ambulatory Visit | Attending: Family Medicine | Admitting: Family Medicine

## 2024-07-13 DIAGNOSIS — M810 Age-related osteoporosis without current pathological fracture: Secondary | ICD-10-CM | POA: Diagnosis present

## 2024-07-13 DIAGNOSIS — E2839 Other primary ovarian failure: Secondary | ICD-10-CM | POA: Diagnosis present

## 2024-07-16 ENCOUNTER — Encounter: Payer: Self-pay | Admitting: Family Medicine

## 2024-07-16 MED ORDER — ALENDRONATE SODIUM 70 MG PO TABS: 70.0000 mg | ORAL_TABLET | ORAL | 3 refills | Status: AC

## 2024-07-16 NOTE — Addendum Note (Signed)
 Addended by: CANDISE ALEENE DEL on: 07/16/2024 02:04 PM   Modules accepted: Orders

## 2024-07-17 NOTE — Telephone Encounter (Signed)
 No further action needed at this time.

## 2024-07-18 ENCOUNTER — Telehealth: Payer: Self-pay

## 2024-07-18 NOTE — Telephone Encounter (Signed)
 Pt brought [employer] parking placard for completion.  Placed on PCP desk to review and sign, if appropriate.

## 2024-07-19 NOTE — Telephone Encounter (Signed)
 Signed and put in box to go up front. Signed:  Gerlene Hockey, MD           07/19/2024

## 2024-07-25 NOTE — Telephone Encounter (Signed)
 Form was mailed back to pt's address listed on file. Daughter called today to confirm which address, address confirmed. They will wait until Friday to confirm if received, if not will call back.

## 2024-08-01 DIAGNOSIS — D485 Neoplasm of uncertain behavior of skin: Secondary | ICD-10-CM | POA: Insufficient documentation

## 2024-08-02 ENCOUNTER — Encounter: Payer: Self-pay | Admitting: Family Medicine

## 2024-08-02 ENCOUNTER — Ambulatory Visit: Admitting: Family Medicine

## 2024-08-02 VITALS — BP 110/72 | HR 78 | Temp 98.4°F | Ht 60.0 in | Wt 150.2 lb

## 2024-08-02 DIAGNOSIS — N390 Urinary tract infection, site not specified: Secondary | ICD-10-CM | POA: Diagnosis not present

## 2024-08-02 DIAGNOSIS — R600 Localized edema: Secondary | ICD-10-CM

## 2024-08-02 DIAGNOSIS — R3129 Other microscopic hematuria: Secondary | ICD-10-CM

## 2024-08-02 DIAGNOSIS — M810 Age-related osteoporosis without current pathological fracture: Secondary | ICD-10-CM | POA: Diagnosis not present

## 2024-08-02 DIAGNOSIS — I10 Essential (primary) hypertension: Secondary | ICD-10-CM

## 2024-08-02 LAB — URINALYSIS, ROUTINE W REFLEX MICROSCOPIC
Bilirubin Urine: NEGATIVE
Hgb urine dipstick: NEGATIVE
Ketones, ur: NEGATIVE
Nitrite: NEGATIVE
Specific Gravity, Urine: 1.01 (ref 1.000–1.030)
Total Protein, Urine: NEGATIVE
Urine Glucose: NEGATIVE
Urobilinogen, UA: 0.2 (ref 0.0–1.0)
pH: 6.5 (ref 5.0–8.0)

## 2024-08-02 LAB — POCT URINALYSIS DIPSTICK
Bilirubin, UA: NEGATIVE
Glucose, UA: NEGATIVE
Ketones, UA: NEGATIVE
Leukocytes, UA: NEGATIVE
Nitrite, UA: NEGATIVE
Protein, UA: NEGATIVE
Spec Grav, UA: 1.015
Urobilinogen, UA: 0.2 U/dL
pH, UA: 6

## 2024-08-02 NOTE — Progress Notes (Signed)
 OFFICE VISIT  08/02/2024  CC:  Chief Complaint  Patient presents with   Medical Management of Chronic Issues    Pt has concerns about Fosamax  side effects; not fasting    Patient is a 84 y.o. female who presents for 30-month follow-up bilateral lower extremity edema, hypertension, and osteoporosis. A/P as of last visit: #1 bilateral lower extreme edema, chronic venous insufficiency. Stable on Lasix  20 mg every morning.  Continue best efforts at low-sodium diet and as needed elevation. She can use an extra daily dose of Lasix  on a as needed basis but will call if she is having to do this for several days in a row. Monitor electrolytes and creatinine today.   2.  Essential hypertension, stable on lisinopril -HCTZ 10-12.5, 1 tab daily. Monitor electrolytes and creatinine today.   3. Hypothyroidism, most recent TSH was a little bit low--> 2 months ago.  At that time we changed her dose to: one half of 125 mcg tab 2 days a week and continue 1 whole tab 5 days/week.  TSH monitoring today.   #4 osteoporosis. This diagnosis is listed in her past medical history but there is no clear history that she was ever on medication other than her vitamin D  and calcium. Most recent DEXAs 2019 and 2023 her lowest bone density was -2.3. DEXA ordered today.  INTERIM HX: Elvie is feeling well. All blood results last visit were excellent. Bone density showed T-score -3.3 a few weeks ago.  I recommended she start Fosamax  weekly.  She picked this up but has a few questions before she takes it.  Avg need for lasix  3d/week. R shoulder continues to gradually improved with rehab (history of right humerus fracture, rotator cuff tear, and subsequent reverse total shoulder arthroplasty last year).  She has no current urinary symptoms but history of recurrent UTI (most recent 110 825) and requests urinalysis today.  ROS as above, plus--> no fevers, no CP, no SOB, no wheezing, no cough, no dizziness, no HAs, no  rashes, no melena/hematochezia.  No polyuria or polydipsia.  No myalgias or arthralgias.  No focal weakness, paresthesias, or tremors.  No acute vision or hearing abnormalities.  No dysuria or unusual/new urinary urgency or frequency. No n/v/d or abd pain.  No palpitations.    Past Medical History:  Diagnosis Date   Allergy     BPV (benign positional vertigo) 02/19/2013   mild   GERD (gastroesophageal reflux disease) 05/27/2014   H/O breast biopsy    benign, on left   History of kidney stones    History of small bowel obstruction    Hypertension    Hypothyroidism    Obesity    Osteoarthritis, multiple sites    severe, b/l knees &b/l hips replaced, shoulder x 1   Osteoporosis    Osteoporosis    07/2024 DEXA T score -3.3   Unsteady gait 11/25/2014   Vitamin D  deficiency     Past Surgical History:  Procedure Laterality Date   ABDOMINAL HYSTERECTOMY     CATARACT EXTRACTION Left 2017   2017 and 2019, left   COLONOSCOPY     10/2020 Adenoma x 1, reevaluate 3 years per GI   HIP SURGERY     both hips   KNEE SURGERY     both knees replaced   LITHOTRIPSY     For kidney stones   REVERSE SHOULDER ARTHROPLASTY Right 01/18/2024   Procedure: ARTHROPLASTY, SHOULDER, TOTAL, REVERSE;  Surgeon: Josefina Chew, MD;  Location: [facility];  Service: Orthopedics;  Laterality: Right;    Outpatient Medications Prior to Visit  Medication Sig Dispense Refill   Ascorbic Acid (VITAMIN C) 1000 MG tablet Take 1,000 mg by mouth daily.     aspirin  81 MG tablet Take 81 mg by mouth daily.     Cholecalciferol (VITAMIN D ) 2000 UNITS tablet Take 2,000 Units by mouth daily.     Coenzyme Q10 (CO Q 10) 100 MG CAPS Take 100 mg by mouth daily.     furosemide  (LASIX ) 20 MG tablet Take 1 tablet (20 mg total) by mouth daily. 90 tablet 3   lisinopril -hydrochlorothiazide  (ZESTORETIC ) 10-12.5 MG tablet TAKE 1 TABLET BY MOUTH EVERY DAY 90 tablet 3   Omega-3 Fatty Acids (FISH OIL) 1000 MG CPDR Take 1,000 mg by mouth daily.      Probiotic Product (PROBIOTIC DAILY PO) Take 1 capsule by mouth daily.     SYNTHROID  125 MCG tablet TAKE 1 TABLET (125 MCG TOTAL) BY MOUTH DAILY. SYNTHROID  90 tablet 3   alendronate  (FOSAMAX ) 70 MG tablet Take 1 tablet (70 mg total) by mouth every 7 (seven) days. Take with a full glass of water  on an empty stomach. (Patient not taking: Reported on 08/02/2024) 12 tablet 3   No facility-administered medications prior to visit.    Allergies[1]  Review of Systems As per HPI  PE:    08/02/2024   10:07 AM 05/02/2024    9:30 AM 04/12/2024    4:02 PM  Vitals with BMI  Height 5' 0 5' 0 5' 0  Weight 150 lbs 3 oz 153 lbs 10 oz 149 lbs 13 oz  BMI 29.33 30 29.26  Systolic 110 100 891  Diastolic 72 66 60  Pulse 78 82 87     Physical Exam  Gen: Alert, well appearing.  Patient is oriented to person, place, time, and situation. AFFECT: pleasant, lucid thought and speech. CV: RRR, no m/r/g.   LUNGS: CTA bilat, nonlabored resps, good aeration in all lung fields. EXT: 2+ pitting edema in lower legs bilaterally  LABS:  Last CBC Lab Results  Component Value Date   WBC 5.9 02/28/2024   HGB 13.3 02/28/2024   HCT 41.4 02/28/2024   MCV 93.7 02/28/2024   MCH 30.1 02/28/2024   RDW 12.9 02/28/2024   PLT 319 02/28/2024   Last metabolic panel Lab Results  Component Value Date   GLUCOSE 83 05/02/2024   NA 142 05/02/2024   K 3.7 05/02/2024   CL 102 05/02/2024   CO2 30 05/02/2024   BUN 15 05/02/2024   CREATININE 0.64 05/02/2024   GFR 81.84 05/02/2024   CALCIUM 9.3 05/02/2024   PHOS 4.1 05/17/2014   PROT 6.5 02/28/2024   ALBUMIN 2.7 (L) 01/20/2024   BILITOT 0.5 02/28/2024   ALKPHOS 43 01/20/2024   AST 11 02/28/2024   ALT 7 02/28/2024   ANIONGAP 8 01/20/2024   Last lipids Lab Results  Component Value Date   CHOL 164 02/28/2024   HDL 51 02/28/2024   LDLCALC 89 02/28/2024   TRIG 144 02/28/2024   CHOLHDL 3.2 02/28/2024   Last hemoglobin A1c Lab Results  Component Value Date    HGBA1C 5.7 06/22/2023   Last thyroid  functions Lab Results  Component Value Date   TSH 0.57 05/02/2024   FREET4 1.13 10/27/2019   Last vitamin D  Lab Results  Component Value Date   VD25OH 65 02/28/2024   IMPRESSION AND PLAN:  #1 bilateral lower extreme edema, chronic venous insufficiency. Stable on Lasix  20 mg approx 3d/week.  Continue best efforts at low-sodium diet and as needed elevation. Monitor electrolytes and creatinine today.   2.  Essential hypertension, stable on lisinopril -HCTZ 10-12.5, 1 tab daily. Monitor electrolytes and creatinine today.  #3 osteoporosis. We discussed risk/benefits of alendronate .  Reviewed the best way to take it to avoid upper GI side effects. She will start the medication now. Plan recheck bone density in 2 years. Her calcium and vitamin D  have been normal and she will continue her supplements of both.  #4 history of recurrent UTI. Most recent was October 2025. She does not have any symptoms of UTI at this time but requested urinalysis out of precaution. Dipstick UA today--> trace blood.  Otherwise normal.  Sent specimen for microscopy.  An After Visit Summary was printed and given to the patient.  FOLLOW UP: No follow-ups on file.  Signed:  Gerlene Hockey, MD           08/02/2024     [1]  Allergies Allergen Reactions   Egg Protein-Containing Drug Products Shortness Of Breath and Rash   Sulfa Antibiotics Nausea And Vomiting   Keflex  [Cephalexin ] Rash        Penicillins Rash    Tolerated Ancef  for orthopedic procedure on 01/18/2024.   Poison Ivy Extract [Poison Ivy Extract] Rash

## 2024-08-03 ENCOUNTER — Ambulatory Visit: Payer: Self-pay | Admitting: Family Medicine

## 2024-08-03 LAB — BASIC METABOLIC PANEL WITH GFR
BUN: 14 mg/dL (ref 7–25)
CO2: 28 mmol/L (ref 20–32)
Calcium: 9.5 mg/dL (ref 8.6–10.4)
Chloride: 105 mmol/L (ref 98–110)
Creat: 0.64 mg/dL (ref 0.60–0.95)
Glucose, Bld: 86 mg/dL (ref 65–99)
Potassium: 3.9 mmol/L (ref 3.5–5.3)
Sodium: 141 mmol/L (ref 135–146)
eGFR: 88 mL/min/{1.73_m2}

## 2024-08-04 ENCOUNTER — Other Ambulatory Visit: Payer: Self-pay

## 2024-08-04 DIAGNOSIS — I10 Essential (primary) hypertension: Secondary | ICD-10-CM

## 2024-08-04 MED ORDER — LISINOPRIL-HYDROCHLOROTHIAZIDE 10-12.5 MG PO TABS: 1.0000 | ORAL_TABLET | Freq: Every day | ORAL | 3 refills | Status: AC

## 2024-08-04 NOTE — Telephone Encounter (Signed)
 Pt has had appt since Rx request. Meds provided in appt

## 2024-08-09 ENCOUNTER — Ambulatory Visit

## 2024-08-09 VITALS — BP 103/67 | HR 87 | Temp 97.8°F | Ht 60.0 in | Wt 150.4 lb

## 2024-08-09 DIAGNOSIS — Z0000 Encounter for general adult medical examination without abnormal findings: Secondary | ICD-10-CM

## 2024-08-09 NOTE — Patient Instructions (Signed)
 Felicia Lowe,  Thank you for taking the time for your Medicare Wellness Visit. I appreciate your continued commitment to your health goals. Please review the care plan we discussed, and feel free to reach out if I can assist you further.  Please note that Annual Wellness Visits do not include a physical exam. Some assessments may be limited, especially if the visit was conducted virtually. If needed, we may recommend an in-person follow-up with your provider.  Ongoing Care Seeing your primary care provider every 3 to 6 months helps us  monitor your health and provide consistent, personalized care. Each day, aim for 6 glasses of water , plenty of protein in your diet and try to get up and walk/ stretch every hour for 5-10 minutes at a time.    Referrals If a referral was made during today's visit and you haven't received any updates within two weeks, please contact the referred provider directly to check on the status.  Recommended Screenings:  Health Maintenance  Topic Date Due   COVID-19 Vaccine (7 - 2025-26 season) 03/06/2024   Medicare Annual Wellness Visit  03/17/2024   DTaP/Tdap/Td vaccine (3 - Td or Tdap) 03/06/2026   Pneumococcal Vaccine for age over 64  Completed   Flu Shot  Completed   Osteoporosis screening with Bone Density Scan  Completed   Zoster (Shingles) Vaccine  Completed   Meningitis B Vaccine  Aged Out   Hepatitis C Screening  Discontinued       08/09/2024    1:18 PM  Advanced Directives  Does Patient Have a Medical Advance Directive? Yes  Type of Estate Agent of [employer];Out of facility DNR (pink MOST or yellow form)  Copy of Healthcare Power of Attorney in Chart? No - copy requested    Vision: Annual vision screenings are recommended for early detection of glaucoma, cataracts, and diabetic retinopathy. These exams can also reveal signs of chronic conditions such as diabetes and high blood pressure.  Dental: Annual dental screenings help  detect early signs of oral cancer, gum disease, and other conditions linked to overall health, including heart disease and diabetes.  Please see the attached documents for additional preventive care recommendations.

## 2024-08-09 NOTE — Progress Notes (Signed)
 "  Chief Complaint  Patient presents with   Medicare Wellness     Subjective:   Felicia Lowe is a 84 y.o. female who presents for a Medicare Annual Wellness Visit.  Visit info / Clinical Intake: Medicare Wellness Visit Type:: Subsequent Annual Wellness Visit Persons participating in visit and providing information:: patient Medicare Wellness Visit Mode:: In-person (required for WTM) Interpreter Needed?: No Pre-visit prep was completed: yes AWV questionnaire completed by patient prior to visit?: no Living arrangements:: lives with spouse/significant other Patient's Overall Health Status Rating: very good Typical amount of pain: none Does pain affect daily life?: no  Dietary Habits and Nutritional Risks How many meals a day?: 2 Eats fruit and vegetables daily?: yes Most meals are obtained by: preparing own meals In the last 2 weeks, have you had any of the following?: none Diabetic:: no  Functional Status Activities of Daily Living (to include ambulation/medication): Independent Ambulation: Independent with device- listed below Home Assistive Devices/Equipment: Eyeglasses Medication Administration: Independent Home Management (perform basic housework or laundry): Independent Manage your own finances?: yes Primary transportation is: driving Concerns about vision?: no *vision screening is required for WTM* Concerns about hearing?: no  Fall Screening Falls in the past year?: 1 Number of falls in past year: 0 Was there an injury with Fall?: 1 (broken shoulder) Fall Risk Category Calculator: 2 Patient Fall Risk Level: Moderate Fall Risk  Fall Risk Patient at Risk for Falls Due to: No Fall Risks Fall risk Follow up: Falls evaluation completed; Falls prevention discussed  Home and Transportation Safety: All rugs have non-skid backing?: N/A, no rugs All stairs or steps have railings?: yes (has one step from garage) Grab bars in the bathtub or shower?: yes Have non-skid  surface in bathtub or shower?: yes Good home lighting?: yes Regular seat belt use?: yes Hospital stays in the last year:: (!) yes How many hospital stays:: 7 Reason: for broken shoulder  Cognitive Assessment Difficulty concentrating, remembering, or making decisions? : no Will 6CIT or Mini Cog be Completed: no 6CIT or Mini Cog Declined: patient alert, oriented, able to answer questions appropriately and recall recent events  Advance Directives (For Healthcare) Does Patient Have a Medical Advance Directive?: Yes Does patient want to make changes to medical advance directive?: No - Patient declined Type of Advance Directive: Healthcare Power of [place]; Out of facility DNR (pink MOST or yellow form) Copy of Healthcare Power of Attorney in Chart?: No - copy requested Out of facility DNR (pink MOST or yellow form) in Chart? (Ambulatory ONLY): No - copy requested  Reviewed/Updated  Reviewed/Updated: Reviewed All (Medical, Surgical, Family, Medications, Allergies, Care Teams, Patient Goals)    Allergies (verified) Egg protein-containing drug products, Sulfa antibiotics, Keflex  [cephalexin ], Penicillins, and Poison ivy extract [poison ivy extract]   Current Medications (verified) Outpatient Encounter Medications as of 08/09/2024  Medication Sig   alendronate  (FOSAMAX ) 70 MG tablet Take 1 tablet (70 mg total) by mouth every 7 (seven) days. Take with a full glass of water  on an empty stomach.   Ascorbic Acid (VITAMIN C) 1000 MG tablet Take 1,000 mg by mouth daily.   aspirin  81 MG tablet Take 81 mg by mouth daily.   Cholecalciferol (VITAMIN D ) 2000 UNITS tablet Take 2,000 Units by mouth daily.   Coenzyme Q10 (CO Q 10) 100 MG CAPS Take 100 mg by mouth daily.   furosemide  (LASIX ) 20 MG tablet Take 1 tablet (20 mg total) by mouth daily. (Patient taking differently: Take 20 mg by mouth daily.  Takes 3-4 times a week)   lisinopril -hydrochlorothiazide  (ZESTORETIC ) 10-12.5 MG tablet Take 1 tablet by  mouth daily.   Omega-3 Fatty Acids (FISH OIL) 1000 MG CPDR Take 1,000 mg by mouth daily.   Probiotic Product (PROBIOTIC DAILY PO) Take 1 capsule by mouth daily.   SYNTHROID  125 MCG tablet TAKE 1 TABLET (125 MCG TOTAL) BY MOUTH DAILY. SYNTHROID    No facility-administered encounter medications on file as of 08/09/2024.    History: Past Medical History:  Diagnosis Date   Allergy     BPV (benign positional vertigo) 02/19/2013   mild   GERD (gastroesophageal reflux disease) 05/27/2014   H/O breast biopsy    benign, on left   History of kidney stones    History of small bowel obstruction    Hypertension    Hypothyroidism    Obesity    Osteoarthritis, multiple sites    severe, b/l knees &b/l hips replaced, shoulder x 1   Osteoporosis    Osteoporosis    07/2024 DEXA T score -3.3   Unsteady gait 11/25/2014   Vitamin D  deficiency    Past Surgical History:  Procedure Laterality Date   ABDOMINAL HYSTERECTOMY     CATARACT EXTRACTION Left 2017   2017 and 2019, left   COLONOSCOPY     10/2020 Adenoma x 1, reevaluate 3 years per GI   HIP SURGERY     both hips   KNEE SURGERY     both knees replaced   LITHOTRIPSY     For kidney stones   REVERSE SHOULDER ARTHROPLASTY Right 01/18/2024   Procedure: ARTHROPLASTY, SHOULDER, TOTAL, REVERSE;  Surgeon: Josefina Chew, MD;  Location: [facility];  Service: Orthopedics;  Laterality: Right;   Family History  Problem Relation Age of Onset   Hyperlipidemia Mother    Hypertension Mother    Heart attack Mother    Other Mother        goiter problems   Cancer Father        lung- smoker   Heart attack Father        X 2   Thyroid  disease Brother    Hypertension Son    Rashes / Skin problems Son    Hernia Son        umbilical   Cancer Paternal Grandmother    Macular degeneration Brother    Breast cancer Maternal Aunt    Colon cancer Neg Hx    Esophageal cancer Neg Hx    Rectal cancer Neg Hx    Stomach cancer Neg Hx    Social History    Occupational History   Occupation: Retired warden/ranger  Tobacco Use   Smoking status: Never   Smokeless tobacco: Never  Vaping Use   Vaping status: Never Used  Substance and Sexual Activity   Alcohol use: Not Currently   Drug use: No   Sexual activity: Never   Tobacco Counseling Counseling given: Not Answered  SDOH Screenings   Food Insecurity: No Food Insecurity (08/09/2024)  Housing: Low Risk (08/09/2024)  Transportation Needs: No Transportation Needs (08/09/2024)  Utilities: Not At Risk (08/09/2024)  Alcohol Screen: Low Risk (03/18/2023)  Depression (PHQ2-9): Low Risk (08/09/2024)  Financial Resource Strain: Low Risk (07/04/2024)  Physical Activity: Inactive (08/09/2024)  Social Connections: Socially Integrated (08/09/2024)  Stress: No Stress Concern Present (08/09/2024)  Tobacco Use: Low Risk (08/09/2024)  Health Literacy: Adequate Health Literacy (08/09/2024)   See flowsheets for full screening details  Depression Screen PHQ 2 & 9 Depression Scale- Over the past 2  weeks, how often have you been bothered by any of the following problems? Little interest or pleasure in doing things: 0 Feeling down, depressed, or hopeless (PHQ Adolescent also includes...irritable): 0 PHQ-2 Total Score: 0 Trouble falling or staying asleep, or sleeping too much: 0 Feeling tired or having little energy: 0 Poor appetite or overeating (PHQ Adolescent also includes...weight loss): 0 Feeling bad about yourself - or that you are a failure or have let yourself or your family down: 0 Trouble concentrating on things, such as reading the newspaper or watching television (PHQ Adolescent also includes...like school work): 0 Moving or speaking so slowly that other people could have noticed. Or the opposite - being so fidgety or restless that you have been moving around a lot more than usual: 0 Thoughts that you would be better off dead, or of hurting yourself in some way: 0 PHQ-9 Total Score: 0 If you checked off  any problems, how difficult have these problems made it for you to do your work, take care of things at home, or get along with other people?: Not difficult at all  Depression Treatment Depression Interventions/Treatment : EYV7-0 Score <4 Follow-up Not Indicated     Goals Addressed               This Visit's Progress     Patient Stated (pt-stated)        Would to get shoulder better/2026             Objective:    Today's Vitals   08/09/24 1329  BP: 103/67  Pulse: 87  Temp: 97.8 F (36.6 C)  TempSrc: Oral  SpO2: 97%  Weight: 150 lb 6.4 oz (68.2 kg)  Height: 5' (1.524 m)   Body mass index is 29.37 kg/m.  Hearing/Vision screen Hearing Screening - Comments:: Denies hearing difficulties   Vision Screening - Comments:: Wears eyeglasses/UTD/[place] [facility]/[place] Immunizations and Health Maintenance Health Maintenance  Topic Date Due   COVID-19 Vaccine (7 - 2025-26 season) 03/06/2024   Medicare Annual Wellness (AWV)  08/09/2025   DTaP/Tdap/Td (3 - Td or Tdap) 03/06/2026   Pneumococcal Vaccine: 50+ Years  Completed   Influenza Vaccine  Completed   Bone Density Scan  Completed   Zoster Vaccines- Shingrix  Completed   Meningococcal B Vaccine  Aged Out   Hepatitis C Screening  Discontinued        Assessment/Plan:  This is a routine wellness examination for [place].  Patient Care Team: Candise Aleene DEL, MD as PCP - General (Family Medicine) Leni Marjory MATSU, MD as Consulting Physician (Rheumatology) Fleeta Smock, Lamar BROCKS, MD as Consulting Physician (Allergy  and Immunology)  I have personally reviewed and noted the following in the patients chart:   Medical and social history Use of alcohol, tobacco or illicit drugs  Current medications and supplements including opioid prescriptions. Functional ability and status Nutritional status Physical activity Advanced directives List of other physicians Hospitalizations, surgeries, and ER visits in  previous 12 months Vitals Screenings to include cognitive, depression, and falls Referrals and appointments  No orders of the defined types were placed in this encounter.  In addition, I have reviewed and discussed with patient certain preventive protocols, quality metrics, and best practice recommendations. A written personalized care plan for preventive services as well as general preventive health recommendations were provided to patient.   Saeed Toren L Jakia Kennebrew, CMA   08/09/2024   Return in 1 year (on 08/09/2025).  After Visit Summary: (MyChart) Due to this being a telephonic  visit, the after visit summary with patients personalized plan was offered to patient via MyChart   Nurse Notes: No voiced or noted concerns at this time. "

## 2024-08-10 ENCOUNTER — Encounter: Payer: Self-pay | Admitting: Family Medicine

## 2024-11-08 ENCOUNTER — Ambulatory Visit: Admitting: Family Medicine

## 2025-08-15 ENCOUNTER — Ambulatory Visit
